# Patient Record
Sex: Female | Born: 1990 | Race: White | Hispanic: No | Marital: Married | Smoking: Never smoker
Health system: Southern US, Community
[De-identification: ages and names within clinical notes are randomized; demographics above are authoritative.]

## PROBLEM LIST (undated history)

## (undated) DIAGNOSIS — M7989 Other specified soft tissue disorders: Secondary | ICD-10-CM

## (undated) DIAGNOSIS — F909 Attention-deficit hyperactivity disorder, unspecified type: Secondary | ICD-10-CM

## (undated) DIAGNOSIS — F32A Depression, unspecified: Secondary | ICD-10-CM

## (undated) DIAGNOSIS — E739 Lactose intolerance, unspecified: Secondary | ICD-10-CM

## (undated) DIAGNOSIS — G43009 Migraine without aura, not intractable, without status migrainosus: Secondary | ICD-10-CM

## (undated) DIAGNOSIS — T7840XA Allergy, unspecified, initial encounter: Secondary | ICD-10-CM

## (undated) DIAGNOSIS — G9332 Myalgic encephalomyelitis/chronic fatigue syndrome: Secondary | ICD-10-CM

## (undated) DIAGNOSIS — F329 Major depressive disorder, single episode, unspecified: Secondary | ICD-10-CM

## (undated) DIAGNOSIS — K589 Irritable bowel syndrome without diarrhea: Secondary | ICD-10-CM

## (undated) DIAGNOSIS — M2550 Pain in unspecified joint: Secondary | ICD-10-CM

## (undated) DIAGNOSIS — M1990 Unspecified osteoarthritis, unspecified site: Secondary | ICD-10-CM

## (undated) DIAGNOSIS — F419 Anxiety disorder, unspecified: Secondary | ICD-10-CM

## (undated) DIAGNOSIS — M549 Dorsalgia, unspecified: Secondary | ICD-10-CM

## (undated) DIAGNOSIS — R5382 Chronic fatigue, unspecified: Secondary | ICD-10-CM

## (undated) DIAGNOSIS — K5900 Constipation, unspecified: Secondary | ICD-10-CM

## (undated) HISTORY — PX: KNEE ARTHROSCOPY: SUR90

## (undated) HISTORY — DX: Lactose intolerance, unspecified: E73.9

## (undated) HISTORY — DX: Unspecified osteoarthritis, unspecified site: M19.90

## (undated) HISTORY — DX: Myalgic encephalomyelitis/chronic fatigue syndrome: G93.32

## (undated) HISTORY — DX: Pain in unspecified joint: M25.50

## (undated) HISTORY — DX: Depression, unspecified: F32.A

## (undated) HISTORY — DX: Allergy, unspecified, initial encounter: T78.40XA

## (undated) HISTORY — DX: Attention-deficit hyperactivity disorder, unspecified type: F90.9

## (undated) HISTORY — DX: Migraine without aura, not intractable, without status migrainosus: G43.009

## (undated) HISTORY — DX: Irritable bowel syndrome, unspecified: K58.9

## (undated) HISTORY — DX: Other specified soft tissue disorders: M79.89

## (undated) HISTORY — PX: WISDOM TOOTH EXTRACTION: SHX21

## (undated) HISTORY — DX: Anxiety disorder, unspecified: F41.9

## (undated) HISTORY — DX: Dorsalgia, unspecified: M54.9

## (undated) HISTORY — DX: Chronic fatigue, unspecified: R53.82

## (undated) HISTORY — DX: Constipation, unspecified: K59.00

---
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
## 2014-09-04 ENCOUNTER — Ambulatory Visit (INDEPENDENT_AMBULATORY_CARE_PROVIDER_SITE_OTHER): Payer: Federal, State, Local not specified - PPO | Admitting: Physician Assistant

## 2014-09-04 VITALS — BP 114/76 | HR 74 | Temp 98.0°F | Resp 18 | Ht 66.0 in | Wt 261.0 lb

## 2014-09-04 DIAGNOSIS — Z0000 Encounter for general adult medical examination without abnormal findings: Secondary | ICD-10-CM | POA: Diagnosis not present

## 2014-09-04 DIAGNOSIS — Z111 Encounter for screening for respiratory tuberculosis: Secondary | ICD-10-CM

## 2014-09-04 NOTE — Progress Notes (Signed)
   Subjective:    Patient ID: Carmen Wall [place], female    DOB: 1991/06/04, 24 y.o.   MRN: [id number]  Chief Complaint  Patient presents with  . CPE    Work   There are no active problems to display for this patient.  Prior to Admission medications   Not on File   Medications, allergies, past medical history, surgical history, family history, social history and problem list reviewed and updated.  HPI  [age] yof with no significant pmh presents for cpe for work.   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 Denies any issues, aches, pains, complaints other than the below in the ROS.   She does not exercise. Married with one daughter, 3 yrs old. Started dieting 3 wks ago, no specific plan, just trying to limit calories. Sees a dentist yearly. No eye dr.   Last pap was 3 yrs ago. Pt declines pap today, stating she will obtain insurance with her new job next week and will plan to get a CPE at that time.  Last tdap with pregnancy. Declines any lab testing today until insurance takes effect.   Needs ppd placed for work.   Review of Systems Per ROS form:  Eye itching - both eyes watery/itchy during spring, recurrent for yrs Abd pain - Present since IUD inserted 3 yrs ago, stable, described as crampiness once month Increased thirst - Has been dieting past few wks, thinks she is more thirsty because of this Vaginal dc - Intermittent since IUD insertion, no odor, no itching Dizzy/lh - Mild, inermittent for 1-2 yrs, gets LH for several seconds if stands up too quickly Otherwise normal per ROS form.      Objective:   Physical Exam  Constitutional: She is oriented to person, place, and time. She appears well-developed and well-nourished.  Non-toxic appearance. She does not have a sickly appearance. She does not appear ill. No distress.  BP 114/76 mmHg  Pulse 74  Temp(Src) 98 F (36.7 C) (Oral)  Resp 18  Ht 5\' 6"  (1.676 Wall)  Wt 261 lb (118.389 kg)  BMI 42.15 kg/m2   SpO2 100%  LMP 08/21/2014   HENT:  Right Ear: Tympanic membrane normal.  Left Ear: Tympanic membrane normal.  Mouth/Throat: Uvula is midline, oropharynx is clear and moist and mucous membranes are normal.  Eyes: Conjunctivae and EOM are normal. Pupils are equal, round, and reactive to light.  Cardiovascular: Normal rate, regular rhythm and normal heart sounds.   Pulmonary/Chest: Effort normal and breath sounds normal. No tachypnea.  Abdominal: Soft. Normal appearance and bowel sounds are normal. There is no tenderness.  Neurological: She is alert and oriented to person, place, and time. She has normal strength. No cranial nerve deficit or sensory deficit. She displays a negative Romberg sign.  Psychiatric: She has a normal mood and affect. Her speech is normal and behavior is normal.      Assessment & Plan:   [age] yof with no significant pmh presents for cpe for work.   Annual physical exam --normal exam, vitals --ok to continue with position, no concerns --recd claritin for likely allergic rhinitis with her itchy/watery eyes --rtc once insured for pap, lab work, vaccines as needed  Encounter for PPD test - Plan: TB Skin Test --placed, rtc 48-72 hrs for check  Donnajean Lopesodd Wall. Naida Escalante, PA-C Physician Assistant-Certified [facility]  09/05/2014 10:06 AM

## 2014-09-04 NOTE — Patient Instructions (Signed)
Your physical exam was normal today.  Please come back in 48-72 hours for your TB recheck.  Please be sure to follow with a family care provider once you get insurance to get a pap smear and any further labs.

## 2014-09-04 NOTE — Progress Notes (Signed)

## 2014-09-07 ENCOUNTER — Ambulatory Visit (INDEPENDENT_AMBULATORY_CARE_PROVIDER_SITE_OTHER): Payer: Federal, State, Local not specified - PPO | Admitting: Family Medicine

## 2014-09-07 DIAGNOSIS — Z7689 Persons encountering health services in other specified circumstances: Secondary | ICD-10-CM

## 2014-09-07 DIAGNOSIS — Z111 Encounter for screening for respiratory tuberculosis: Secondary | ICD-10-CM

## 2014-09-07 LAB — TB SKIN TEST
Induration: 0 mm
TB Skin Test: NEGATIVE

## 2014-09-07 NOTE — Progress Notes (Signed)
   Subjective:    Patient ID: Carmen Wall [place], female    DOB: 04/24/1991, 24 y.o.   MRN: [id number]  HPI Patient is a 24 yo female who presents for a PPD test reading. Test was negative. Induration was 0.5600mm. Patient received a copy of her results upon leaving.     Review of Systems     Objective:   Physical Exam        Assessment & Plan:

## 2014-09-09 ENCOUNTER — Ambulatory Visit (INDEPENDENT_AMBULATORY_CARE_PROVIDER_SITE_OTHER): Payer: Federal, State, Local not specified - PPO | Admitting: Physician Assistant

## 2014-09-09 VITALS — BP 120/68 | HR 91 | Temp 98.6°F | Resp 20 | Ht 65.5 in | Wt 261.0 lb

## 2014-09-09 DIAGNOSIS — J309 Allergic rhinitis, unspecified: Secondary | ICD-10-CM | POA: Insufficient documentation

## 2014-09-09 DIAGNOSIS — J9801 Acute bronchospasm: Secondary | ICD-10-CM

## 2014-09-09 DIAGNOSIS — R0602 Shortness of breath: Secondary | ICD-10-CM

## 2014-09-09 HISTORY — DX: Allergic rhinitis, unspecified: J30.9

## 2014-09-09 MED ORDER — BECLOMETHASONE DIPROPIONATE 40 MCG/ACT IN AERS: 1.0000 | INHALATION_SPRAY | Freq: Two times a day (BID) | RESPIRATORY_TRACT | Status: DC

## 2014-09-09 MED ORDER — ALBUTEROL SULFATE (2.5 MG/3ML) 0.083% IN NEBU
2.5000 mg | INHALATION_SOLUTION | Freq: Once | RESPIRATORY_TRACT | Status: AC
  Administered 2014-09-09: 2.5 mg via RESPIRATORY_TRACT

## 2014-09-09 MED ORDER — IPRATROPIUM BROMIDE 0.02 % IN SOLN
0.5000 mg | Freq: Once | RESPIRATORY_TRACT | Status: AC
  Administered 2014-09-09: 0.5 mg via RESPIRATORY_TRACT

## 2014-09-09 MED ORDER — HYDROCOD POLST-CHLORPHEN POLST 10-8 MG/5ML PO LQCR: 5.0000 mL | Freq: Two times a day (BID) | ORAL | Status: DC | PRN

## 2014-09-09 MED ORDER — ALBUTEROL SULFATE HFA 108 (90 BASE) MCG/ACT IN AERS: 2.0000 | INHALATION_SPRAY | RESPIRATORY_TRACT | Status: DC | PRN

## 2014-09-09 NOTE — Progress Notes (Signed)
Subjective:    Patient ID: Carmen Wall, female    DOB: Jan 05, 1991, 24 y.o.   MRN: [id number]  HPI  This is a 24 year old female who is presenting with 5 days of cough, SOB and nasal congestion. She feels like she can't get a deep enough breath. She has noticed some wheezing with coughing. Cough is minimally productive of clear mucous. She is sneezing a lot when outdoors. She has tried claritin and dayquil which both help with nasal congestion but do not help cough. She denies sore throat, otalgia, fever or chills. No history of asthma. Not a smoker. Pt has mild environmental allergies, recently moved to [place]. This is her first spring in [place]. Prior to now she was living in [place] and [place] - never been this far [employer]. She has never had to use an inhaler before.  Review of Systems  Constitutional: Negative for fever and chills.  HENT: Positive for congestion. Negative for ear pain, sinus pressure and sore throat.   Eyes: Negative for redness.  Respiratory: Positive for cough, shortness of breath and wheezing.   Gastrointestinal: Negative for nausea and vomiting.  Skin: Negative for rash.  Allergic/Immunologic: Positive for environmental allergies.  Hematological: Negative for adenopathy.  Psychiatric/Behavioral: Positive for sleep disturbance.    There are no active problems to display for this patient.  Prior to Admission medications   Not on File   No Known Allergies  Patient's social and family history were reviewed.     Objective:   Physical Exam  Constitutional: She is oriented to person, place, and time. She appears well-developed and well-nourished. No distress.  HENT:  Head: Normocephalic and atraumatic.  Right Ear: Hearing, external ear and ear canal normal. Tympanic membrane is retracted.  Left Ear: Hearing, external ear and ear canal normal. Tympanic membrane is retracted.  Nose: Nose normal. Right sinus exhibits no maxillary sinus tenderness and no  frontal sinus tenderness. Left sinus exhibits no maxillary sinus tenderness and no frontal sinus tenderness.  Mouth/Throat: Uvula is midline, oropharynx is clear and moist and mucous membranes are normal.  Eyes: Conjunctivae and lids are normal. Right eye exhibits no discharge. Left eye exhibits no discharge. No scleral icterus.  Cardiovascular: Normal rate, regular rhythm, normal heart sounds and normal pulses.   No murmur heard. Pulmonary/Chest: Effort normal. No respiratory distress. She has decreased breath sounds. She has no wheezes. She has no rhonchi. She has no rales.  Breath sounds and symptoms improved after duoneb treatment  Musculoskeletal: Normal range of motion.  Lymphadenopathy:       Head (right side): No submental, no submandibular and no tonsillar adenopathy present.       Head (left side): No submental, no submandibular and no tonsillar adenopathy present.    She has no cervical adenopathy.  Neurological: She is alert and oriented to person, place, and time.  Skin: Skin is warm, dry and intact. No lesion and no rash noted.  Psychiatric: She has a normal mood and affect. Her speech is normal and behavior is normal. Thought content normal.   BP 120/68 mmHg  Pulse 91  Temp(Src) 98.6 F (37 C) (Oral)  Resp 20  Ht 5' 5.5" (1.664 m)  Wt 261 lb (118.389 kg)  BMI 42.76 kg/m2  SpO2 96%  LMP 08/21/2014     Assessment & Plan:  .1. SOB (shortness of breath) 2. Bronchospasm 3. Allergic rhinitis Suspect allergic rhinitis with reactive airway component. Symptoms and breath sounds improved with duoneb  treatment in office. She will continue claritin daily. She will use qvar twice a day for 2 weeks. Albuterol prn SOB, wheezing, chest tightness. She will return if not getting better in 1 week.  - albuterol (PROVENTIL) (2.5 MG/3ML) 0.083% nebulizer solution 2.5 mg; Take 3 mLs (2.5 mg total) by nebulization once. - ipratropium (ATROVENT) nebulizer solution 0.5 mg; Take 2.5 mLs (0.5  mg total) by nebulization once. - albuterol (PROVENTIL HFA;VENTOLIN HFA) 108 (90 BASE) MCG/ACT inhaler; Inhale 2 puffs into the lungs every 4 (four) hours as needed for wheezing or shortness of breath (cough, shortness of breath or wheezing.).  Dispense: 1 Inhaler; Refill: 1 - beclomethasone (QVAR) 40 MCG/ACT inhaler; Inhale 1 puff into the lungs 2 (two) times daily.  Dispense: 1 Inhaler; Refill: 0 - chlorpheniramine-HYDROcodone (TUSSIONEX PENNKINETIC ER) 10-8 MG/5ML LQCR; Take 5 mLs by mouth every 12 (twelve) hours as needed for cough (cough).  Dispense: 80 mL; Refill: 0     Carmen Wall V. Dyke BrackettBush, PA-C, MHS [facility]  09/09/2014

## 2014-09-09 NOTE — Patient Instructions (Signed)
Use qvar inhaler - 1 puff morning and night. Ask pharmacist to show you how to use it. Rinse your mouth out after using. Use albuterol inhaler as needed for chest tightness, shortness of breath or coughing spells. Continue the claritin daily. Return if you're not getting better in a week or so.

## 2014-09-26 ENCOUNTER — Ambulatory Visit (INDEPENDENT_AMBULATORY_CARE_PROVIDER_SITE_OTHER): Payer: Federal, State, Local not specified - PPO | Admitting: Family Medicine

## 2014-09-26 VITALS — BP 100/82 | HR 78 | Temp 98.0°F | Resp 20 | Ht 67.0 in | Wt 262.4 lb

## 2014-09-26 DIAGNOSIS — F32A Depression, unspecified: Secondary | ICD-10-CM

## 2014-09-26 DIAGNOSIS — A499 Bacterial infection, unspecified: Secondary | ICD-10-CM | POA: Diagnosis not present

## 2014-09-26 DIAGNOSIS — N898 Other specified noninflammatory disorders of vagina: Secondary | ICD-10-CM | POA: Diagnosis not present

## 2014-09-26 DIAGNOSIS — R1030 Lower abdominal pain, unspecified: Secondary | ICD-10-CM

## 2014-09-26 DIAGNOSIS — Z6841 Body Mass Index (BMI) 40.0 and over, adult: Secondary | ICD-10-CM

## 2014-09-26 DIAGNOSIS — Z124 Encounter for screening for malignant neoplasm of cervix: Secondary | ICD-10-CM

## 2014-09-26 DIAGNOSIS — F329 Major depressive disorder, single episode, unspecified: Secondary | ICD-10-CM | POA: Diagnosis not present

## 2014-09-26 DIAGNOSIS — N760 Acute vaginitis: Secondary | ICD-10-CM | POA: Diagnosis not present

## 2014-09-26 DIAGNOSIS — R635 Abnormal weight gain: Secondary | ICD-10-CM

## 2014-09-26 DIAGNOSIS — E669 Obesity, unspecified: Secondary | ICD-10-CM | POA: Diagnosis not present

## 2014-09-26 DIAGNOSIS — B9689 Other specified bacterial agents as the cause of diseases classified elsewhere: Secondary | ICD-10-CM

## 2014-09-26 LAB — POCT CBC
GRANULOCYTE PERCENT: 63.5 % (ref 37–80)
HCT, POC: 39.6 % (ref 37.7–47.9)
Hemoglobin: 13.2 g/dL (ref 12.2–16.2)
Lymph, poc: 2.3 (ref 0.6–3.4)
MCH, POC: 29.3 pg (ref 27–31.2)
MCHC: 33.2 g/dL (ref 31.8–35.4)
MCV: 88.4 fL (ref 80–97)
MID (CBC): 0.6 (ref 0–0.9)
MPV: 8.9 fL (ref 0–99.8)
POC GRANULOCYTE: 5.1 (ref 2–6.9)
POC LYMPH %: 29.3 % (ref 10–50)
POC MID %: 7.2 % (ref 0–12)
Platelet Count, POC: 230 10*3/uL (ref 142–424)
RBC: 4.48 M/uL (ref 4.04–5.48)
RDW, POC: 13.1 %
WBC: 8 10*3/uL (ref 4.6–10.2)

## 2014-09-26 LAB — POCT WET PREP WITH KOH
KOH Prep POC: NEGATIVE
RBC WET PREP PER HPF POC: NEGATIVE
Trichomonas, UA: NEGATIVE
Yeast Wet Prep HPF POC: NEGATIVE

## 2014-09-26 LAB — POCT URINE PREGNANCY: Preg Test, Ur: NEGATIVE

## 2014-09-26 MED ORDER — METRONIDAZOLE 500 MG PO TABS: 500.0000 mg | ORAL_TABLET | Freq: Two times a day (BID) | ORAL | Status: DC

## 2014-09-26 MED ORDER — FLUOXETINE HCL 20 MG PO TABS: 20.0000 mg | ORAL_TABLET | Freq: Every day | ORAL | Status: DC

## 2014-09-26 NOTE — Patient Instructions (Addendum)
We are going to treat you for bacterial vaginosis with flagyl twice a day for one week Also, let's start prozac for depression.  It will take a few weeks for you to notice the effect of this medication Start with 1 pill daily and increase to 2 pills after 2 weeks or so.   I will be in touch with your labs Let me know if you are getting worse Otherwise let's plan to recheck in 6-8 weeks.

## 2014-09-26 NOTE — Progress Notes (Signed)
[facility] [address], [place] [id number] [phone]  Date:  09/26/2014   Name:  Carmen Wall [place]   DOB:  06/20/1990   MRN:  [id number]  PCP:  No PCP Per Patient    Chief Complaint: Abdominal Cramping; Abdominal Pain; Gynecologic Exam; and Depression   History of Present Illness:  Carmen Wall [place] is a 24 y.o. very pleasant female patient who presents with the following:  Seen here earlier this month with respiratory illness; this is now better She is here today seeking care for several concerns.   She notes pelvic/ abd cramping that is worse than usual over the last few days.  Also, around her menses she seems to have "no control of my emotions."  She has a mirena IUD that was placed a little over 2 years ago.  She still tends to have regular menses with the IUD about once a month but the bleeding is not heavy.    No vomiting or diarrhea, she does occasionally have a loose stool. She also notes vaginal discharge, but this does not seem far from her baseline discharge.   No urinary sx She is eating normally, "more than I should."    She notes that she seems to respond poorly to stress more over the last couple of years. She is more irritable and tearful than is her norm.  Little things may cause her to cry. This is affecting her home life.   She does admit to feeling more sad than usual as well.    She was on depo prior to her pregnancy.  She got her IUD right after her daughter was born 3 years ago.  Besides becoming a mother her family also recently moved from another state and her husband left the [employer].  There have been a lot of changes in her life.  She denies any SI.  However she is interested in treatment to help with depression  Patient Active Problem List   Diagnosis Date Noted  . Rhinitis, allergic 09/09/2014    Past Medical History  Diagnosis Date  . Allergy   . Anxiety   . Arthritis     Past Surgical History  Procedure  Laterality Date  . Knee arthroscopy Right     History  Substance Use Topics  . Smoking status: Never Smoker   . Smokeless tobacco: Never Used  . Alcohol Use: No    Family History  Problem Relation Age of Onset  . Diabetes Father   . Mental illness Father     No Known Allergies  Medication list has been reviewed and updated.  Current Outpatient Prescriptions on File Prior to Visit  Medication Sig Dispense Refill  . albuterol (PROVENTIL HFA;VENTOLIN HFA) 108 (90 BASE) MCG/ACT inhaler Inhale 2 puffs into the lungs every 4 (four) hours as needed for wheezing or shortness of breath (cough, shortness of breath or wheezing.). 1 Inhaler 1  . beclomethasone (QVAR) 40 MCG/ACT inhaler Inhale 1 puff into the lungs 2 (two) times daily. 1 Inhaler 0  . chlorpheniramine-HYDROcodone (TUSSIONEX PENNKINETIC ER) 10-8 MG/5ML LQCR Take 5 mLs by mouth every 12 (twelve) hours as needed for cough (cough). 80 mL 0   No current facility-administered medications on file prior to visit.    Review of Systems:  As per HPI- otherwise negative.   Physical Examination: Filed Vitals:   09/26/14 1542  BP: 100/82  Pulse: 78  Temp: 98 F (36.7 C)  Resp: 20  Filed Vitals:   09/26/14 1542  Height:  (1.702 Wall)  Weight: 262 lb 6.4 oz (119.024 kg)   Body mass index is 41.09 kg/(Wall^2). Ideal Body Weight: Weight in (lb) to have BMI = 25: 159.3  GEN: WDWN, NAD, Non-toxic, A & O x 3, obese, looks well HEENT: Atraumatic, Normocephalic. Neck supple. No masses, No LAD.  Bilateral TM wnl, oropharynx normal.  PEERL,EOMI.   Ears and Nose: No external deformity. CV: RRR, No Wall/G/R. No JVD. No thrill. No extra heart sounds. PULM: CTA B, no wheezes, crackles, rhonchi. No retractions. No resp. distress. No accessory muscle use. ABD: S, ND, +BS. No rebound. No HSM.  She has mild diffuse tenderness over her belly EXTR: No c/c/e NEURO Normal gait.  PSYCH: Normally interactive. Conversant. Not depressed or anxious  appearing.  Calm demeanor.  Pelvic: normal, no vaginal lesions or discharge. Uterus normal, no CMT, no adnexal tendereness or masses IUD strings are visible.  Slight fishy odor  Results for orders placed or performed in visit on 09/26/14  POCT CBC  Result Value Ref Range   WBC 8.0 4.6 - 10.2 K/uL   Lymph, poc 2.3 0.6 - 3.4   POC LYMPH PERCENT 29.3 10 - 50 %L   MID (cbc) 0.6 0 - 0.9   POC MID % 7.2 0 - 12 %Wall   POC Granulocyte 5.1 2 - 6.9   Granulocyte percent 63.5 37 - 80 %G   RBC 4.48 4.04 - 5.48 Wall/uL   Hemoglobin 13.2 12.2 - 16.2 g/dL   HCT, POC 16.1 09.6 - 47.9 %   MCV 88.4 80 - 97 fL   MCH, POC 29.3 27 - 31.2 pg   MCHC 33.2 31.8 - 35.4 g/dL   RDW, POC 04.5 %   Platelet Count, POC 230 142 - 424 K/uL   MPV 8.9 0 - 99.8 fL  POCT urine pregnancy  Result Value Ref Range   Preg Test, Ur Negative   POCT Wet Prep with KOH  Result Value Ref Range   Trichomonas, UA Negative    Clue Cells Wet Prep HPF POC 5-10    Epithelial Wet Prep HPF POC 7-12    Yeast Wet Prep HPF POC neg    Bacteria Wet Prep HPF POC 4+    RBC Wet Prep HPF POC neg    WBC Wet Prep HPF POC 2-5    KOH Prep POC Negative     Assessment and Plan: Depression - Plan: FLUoxetine (PROZAC) 20 MG tablet  Lower abdominal pain - Plan: POCT CBC, POCT urine pregnancy, Comprehensive metabolic panel  Vaginal discharge - Plan: POCT Wet Prep with KOH  Screening for cervical cancer - Plan: Pap IG, CT/NG w/ reflex HPV when ASC-U  Bacterial vaginosis - Plan: metroNIDAZOLE (FLAGYL) 500 MG tablet  Weight gain - Plan: TSH  [name] is here with a few concerns today. Treat for BV- this may be the cause of her pelvic pain.  OW encouraged her to use scheduled ibuprofen around the time of her menses to help with pain and cramping She would like to start medication for depression; will start her on prozac at .   Await her other labs See patient instructions for more details.     Signed Abbe Amsterdam, MD

## 2014-09-27 ENCOUNTER — Encounter: Payer: Self-pay | Admitting: Family Medicine

## 2014-09-27 LAB — COMPREHENSIVE METABOLIC PANEL
ALBUMIN: 4.2 g/dL (ref 3.5–5.2)
ALT: 31 U/L (ref 0–35)
AST: 66 U/L — AB (ref 0–37)
Alkaline Phosphatase: 57 U/L (ref 39–117)
BILIRUBIN TOTAL: 0.4 mg/dL (ref 0.2–1.2)
BUN: 13 mg/dL (ref 6–23)
CO2: 26 mEq/L (ref 19–32)
Calcium: 9.5 mg/dL (ref 8.4–10.5)
Chloride: 105 mEq/L (ref 96–112)
Creat: 0.61 mg/dL (ref 0.50–1.10)
Glucose, Bld: 81 mg/dL (ref 70–99)
POTASSIUM: 4.5 meq/L (ref 3.5–5.3)
Sodium: 140 mEq/L (ref 135–145)
TOTAL PROTEIN: 6.9 g/dL (ref 6.0–8.3)

## 2014-09-27 LAB — TSH: TSH: 0.857 u[IU]/mL (ref 0.350–4.500)

## 2014-09-29 ENCOUNTER — Encounter: Payer: Self-pay | Admitting: Family Medicine

## 2014-09-30 LAB — PAP IG, CT-NG, RFX HPV ASCU
CHLAMYDIA PROBE AMP: NEGATIVE
GC PROBE AMP: NEGATIVE

## 2014-11-10 ENCOUNTER — Other Ambulatory Visit: Payer: Self-pay | Admitting: Physician Assistant

## 2015-06-08 ENCOUNTER — Ambulatory Visit (INDEPENDENT_AMBULATORY_CARE_PROVIDER_SITE_OTHER): Payer: No Typology Code available for payment source | Admitting: Emergency Medicine

## 2015-06-08 VITALS — BP 118/88 | HR 66 | Temp 98.1°F | Resp 16 | Ht 67.0 in | Wt 265.0 lb

## 2015-06-08 DIAGNOSIS — K0510 Chronic gingivitis, plaque induced: Secondary | ICD-10-CM | POA: Diagnosis not present

## 2015-06-08 DIAGNOSIS — J0140 Acute pansinusitis, unspecified: Secondary | ICD-10-CM | POA: Diagnosis not present

## 2015-06-08 MED ORDER — PSEUDOEPHEDRINE-GUAIFENESIN ER 60-600 MG PO TB12: 1.0000 | ORAL_TABLET | Freq: Two times a day (BID) | ORAL | Status: AC

## 2015-06-08 MED ORDER — AMOXICILLIN-POT CLAVULANATE 875-125 MG PO TABS: 1.0000 | ORAL_TABLET | Freq: Two times a day (BID) | ORAL | Status: DC

## 2015-06-08 NOTE — Patient Instructions (Signed)
Gingivitis °Gingivitis is a form of gum (periodontal) disease that causes redness, soreness, and swelling (inflammation) of your gums. °CAUSES °The most common cause of gingivitis is poor oral hygiene. A sticky substance made of bacteria, mucus, and food particles (plaque), is deposited on the exposed part of teeth. As plaque builds up, it reacts with the saliva in your mouth to form something called  tartar. Tartar is a hard deposit that becomes trapped around the base of the tooth. Plaque and tartar irritate the gums, leading to the formation of gingivitis. Other factors that increase your risk for gingivitis include:  °· Tobacco use. °· Diabetes. °· Older age. °· Certain medications. °· Certain viral or fungal infections. °· Dry mouth. °· Hormonal changes such as during pregnancy. °· Poor nutrition. °· Substance abuse. °· Poor fitting dental restorations or appliances. °SYMPTOMS °You may notice inflammation of the soft tissue (gingiva) around the teeth. When these tissues become inflamed, they bleed easily, especially during flossing or brushing. The gums may also be:  °· Tender to the touch. °· Bright red, purple red, or have a shiny appearance. °· Swollen. °· Wearing away from the teeth (receding), which exposes more of the tooth. °Bad breath is often present. Continued infection around teeth can eventually cause cavities and loosen teeth. This may lead to eventual tooth loss. °DIAGNOSIS °A medical and dental history will be taken. Your mouth, teeth, and gums will be examined. Your dentist will look for soft, swollen purple-red, irritated gums. There may be deposits of plaque and tartar at the base of the teeth. Your gums will be looked at for the degree of redness, puffiness, and bleeding tendencies. Your dentist will see if any of the teeth are loose. X-rays may be taken to see if the inflammation has spread to the supporting structures of the teeth. °TREATMENT °The goal is to reduce and reverse the  inflammation. Proper treatment can usually reverse the symptoms of gingivitis and prevent further progression of the disease. Have your teeth cleaned. During the cleaning, all plaque and tartar will be removed. Instruction for proper home care will be given. You will need regular professional cleanings and check-ups in the future. °HOME CARE INSTRUCTIONS °· Brush your teeth twice a day and floss at least once per day. When flossing, it is best to floss first then brush. °· Limit sugar between meals and maintain a well-balanced diet.  °· Even the best dental hygiene will not prevent plaque from developing. It is necessary for you to see your dentist on a regular basis for cleaning and regular checkups. °· Your dentist can recommend proper oral hygiene and mouth care and suggest special toothpastes or mouth rinses. °· Stop smoking. °SEEK DENTAL OR MEDICAL CARE IF: °· You have painful, reddened tissue around your teeth, or you have puffy swollen gums. °· You have difficulty chewing. °· You notice any loose or infected teeth. °· You have swollen glands. °· Your gums bleed easily when you brush your teeth or are very tender to the touch. °  °This information is not intended to replace advice given to you by your health care provider. Make sure you discuss any questions you have with your health care provider. °  °Document Released: 11/16/2000 Document Revised: 08/15/2011 Document Reviewed: 01/05/2015 °Elsevier Interactive Patient Education ©2016 [employer] ° °

## 2015-06-08 NOTE — Progress Notes (Signed)
Subjective:  Patient ID: Carmen Wall, female    DOB: [birth year]  Age: 25 y.o. MRN: [id number]  CC: Oral Swelling   HPI Carmen Wall presents   Patient been ill with nasal congestion postnasal drainage for approximately 2 weeks. She has no fever or chills. She has a history of prior sinus infections. Her nasal drainage is absent. She has purulent postnasal drainage however. She has  No cough wheezing or shortness of breath. Since becoming ill with a sinus infection she was eating popcorn got a piece of popcorn stuck between her teeth and was rather vigorous and get an removing it using various mechanical means including vigorous toothbrushing and floss. Since that time she's had swelling and redness of her gingiva on the mandibular jaw anteriorly in the area the measures.  He has no tooth pain but has increasing lymphadenitis  History Carmen Wall has a past medical history of Allergy; Anxiety; and Arthritis.   She has past surgical history that includes Knee arthroscopy (Right).   Her  family history includes Diabetes in her father; Mental illness in her father.  She   reports that she has never smoked. She has never used smokeless tobacco. She reports that she does not drink alcohol or use illicit drugs.  Outpatient Prescriptions Prior to Visit  Medication Sig Dispense Refill  . albuterol (PROVENTIL HFA;VENTOLIN HFA) 108 (90 BASE) MCG/ACT inhaler Inhale 2 puffs into the lungs every 4 (four) hours as needed for wheezing or shortness of breath (cough, shortness of breath or wheezing.). 1 Inhaler 1  . beclomethasone (QVAR) 40 MCG/ACT inhaler Inhale 1 puff into the lungs 2 (two) times daily. (Patient not taking: Reported on 06/08/2015) 1 Inhaler 0  . chlorpheniramine-HYDROcodone (TUSSIONEX PENNKINETIC ER) 10-8 MG/5ML LQCR Take 5 mLs by mouth every 12 (twelve) hours as needed for cough (cough). (Patient not taking: Reported on 06/08/2015) 80 mL 0  . FLUoxetine (PROZAC) 20 MG  tablet Take 1 tablet (20 mg total) by mouth daily. Increase to 40 mg after 2 weeks (Patient not taking: Reported on 06/08/2015) 60 tablet 6  . metroNIDAZOLE (FLAGYL) 500 MG tablet Take 1 tablet (500 mg total) by mouth 2 (two) times daily. Take 1 pill twice daily for one week. NO alcohol (Patient not taking: Reported on 06/08/2015) 14 tablet 0   No facility-administered medications prior to visit.    Social History   Social History  . Marital Status: Married    Spouse Name: N/A  . Number of Children: N/A  . Years of Education: N/A   Social History Main Topics  . Smoking status: Never Smoker   . Smokeless tobacco: Never Used  . Alcohol Use: No  . Drug Use: No  . Sexual Activity: Not Asked   Other Topics Concern  . None   Social History Narrative     Review of Systems  Constitutional: Positive for chills. Negative for fever and appetite change.  HENT: Positive for dental problem and postnasal drip. Negative for congestion, ear pain, sinus pressure and sore throat.   Eyes: Negative for pain and redness.  Respiratory: Negative for cough, shortness of breath and wheezing.   Cardiovascular: Negative for leg swelling.  Gastrointestinal: Negative for nausea, vomiting, abdominal pain, diarrhea, constipation and blood in stool.  Endocrine: Negative for polyuria.  Genitourinary: Negative for dysuria, urgency, frequency and flank pain.  Musculoskeletal: Negative for gait problem.  Skin: Negative for rash.  Neurological: Negative for weakness and headaches.  Psychiatric/Behavioral: Negative for confusion and decreased  concentration. The patient is not nervous/anxious.     Objective:  BP 118/88 mmHg  Pulse 66  Temp(Src) 98.1 F (36.7 C) (Oral)  Resp 16  Ht 5\' 7"  (1.702 m)  Wt 265 lb (120.203 kg)  BMI 41.50 kg/m2  SpO2 98%  LMP 05/28/2015  Physical Exam  Constitutional: She is oriented to person, place, and time. She appears well-developed and well-nourished.  HENT:  Head:  Normocephalic and atraumatic.  Mandibular gingivitis   Eyes: Conjunctivae are normal. Pupils are equal, round, and reactive to light.  Pulmonary/Chest: Effort normal.  Musculoskeletal: She exhibits no edema.  Neurological: She is alert and oriented to person, place, and time.  Skin: Skin is dry.  Psychiatric: She has a normal mood and affect. Her behavior is normal. Thought content normal.      Assessment & Plan:   Carmen Wall was seen today for oral swelling.  Diagnoses and all orders for this visit:  Acute pansinusitis, recurrence not specified  Gingivitis  Other orders -     amoxicillin-clavulanate (AUGMENTIN) 875-125 MG tablet; Take 1 tablet by mouth 2 (two) times daily. -     pseudoephedrine-guaifenesin (MUCINEX D) 60-600 MG 12 hr tablet; Take 1 tablet by mouth every 12 (twelve) hours.  I am having Carmen Wall start on amoxicillin-clavulanate and pseudoephedrine-guaifenesin. I am also having her maintain her chlorpheniramine-HYDROcodone, albuterol, beclomethasone, FLUoxetine, and metroNIDAZOLE.  Meds ordered this encounter  Medications  . amoxicillin-clavulanate (AUGMENTIN) 875-125 MG tablet    Sig: Take 1 tablet by mouth 2 (two) times daily.    Dispense:  20 tablet    Refill:  0  . pseudoephedrine-guaifenesin (MUCINEX D) 60-600 MG 12 hr tablet    Sig: Take 1 tablet by mouth every 12 (twelve) hours.    Dispense:  18 tablet    Refill:  0    Appropriate red flag conditions were discussed with the patient as well as actions that should be taken.  Patient expressed his understanding.  Follow-up: Return if symptoms worsen or fail to improve.  Carmelina DaneAnderson, Makani Seckman S, MD

## 2015-07-25 ENCOUNTER — Encounter (HOSPITAL_COMMUNITY): Payer: Self-pay | Admitting: Emergency Medicine

## 2015-07-25 ENCOUNTER — Emergency Department (HOSPITAL_COMMUNITY): Payer: No Typology Code available for payment source

## 2015-07-25 ENCOUNTER — Emergency Department (HOSPITAL_COMMUNITY)
Admission: EM | Admit: 2015-07-25 | Discharge: 2015-07-25 | Disposition: A | Discharge status: Home / Self Care | Payer: No Typology Code available for payment source | Attending: Emergency Medicine | Admitting: Emergency Medicine

## 2015-07-25 DIAGNOSIS — Z8739 Personal history of other diseases of the musculoskeletal system and connective tissue: Secondary | ICD-10-CM | POA: Diagnosis not present

## 2015-07-25 DIAGNOSIS — R319 Hematuria, unspecified: Secondary | ICD-10-CM

## 2015-07-25 DIAGNOSIS — Z3202 Encounter for pregnancy test, result negative: Secondary | ICD-10-CM | POA: Insufficient documentation

## 2015-07-25 DIAGNOSIS — Z8659 Personal history of other mental and behavioral disorders: Secondary | ICD-10-CM | POA: Diagnosis not present

## 2015-07-25 DIAGNOSIS — Z79899 Other long term (current) drug therapy: Secondary | ICD-10-CM | POA: Diagnosis not present

## 2015-07-25 DIAGNOSIS — R109 Unspecified abdominal pain: Secondary | ICD-10-CM | POA: Diagnosis present

## 2015-07-25 DIAGNOSIS — N390 Urinary tract infection, site not specified: Secondary | ICD-10-CM | POA: Diagnosis not present

## 2015-07-25 LAB — URINALYSIS, ROUTINE W REFLEX MICROSCOPIC
Bilirubin Urine: NEGATIVE
Glucose, UA: NEGATIVE mg/dL
KETONES UR: NEGATIVE mg/dL
Nitrite: NEGATIVE
PROTEIN: NEGATIVE mg/dL
Specific Gravity, Urine: 1.016 (ref 1.005–1.030)
pH: 7.5 (ref 5.0–8.0)

## 2015-07-25 LAB — CBC WITH DIFFERENTIAL/PLATELET
BASOS ABS: 0 10*3/uL (ref 0.0–0.1)
Basophils Relative: 0 %
EOS ABS: 0.1 10*3/uL (ref 0.0–0.7)
Eosinophils Relative: 1 %
HEMATOCRIT: 42 % (ref 36.0–46.0)
HEMOGLOBIN: 13.8 g/dL (ref 12.0–15.0)
Lymphocytes Relative: 22 %
Lymphs Abs: 1.7 10*3/uL (ref 0.7–4.0)
MCH: 30.1 pg (ref 26.0–34.0)
MCHC: 32.9 g/dL (ref 30.0–36.0)
MCV: 91.5 fL (ref 78.0–100.0)
MONO ABS: 0.6 10*3/uL (ref 0.1–1.0)
Monocytes Relative: 8 %
NEUTROS ABS: 5.2 10*3/uL (ref 1.7–7.7)
NEUTROS PCT: 69 %
Platelets: 211 10*3/uL (ref 150–400)
RBC: 4.59 MIL/uL (ref 3.87–5.11)
RDW: 12.8 % (ref 11.5–15.5)
WBC: 7.6 10*3/uL (ref 4.0–10.5)

## 2015-07-25 LAB — I-STAT BETA HCG BLOOD, ED (MC, WL, AP ONLY)

## 2015-07-25 LAB — URINE MICROSCOPIC-ADD ON

## 2015-07-25 LAB — COMPREHENSIVE METABOLIC PANEL
ALT: 13 U/L — ABNORMAL LOW (ref 14–54)
ANION GAP: 8 (ref 5–15)
AST: 15 U/L (ref 15–41)
Albumin: 4.1 g/dL (ref 3.5–5.0)
Alkaline Phosphatase: 56 U/L (ref 38–126)
BUN: 13 mg/dL (ref 6–20)
CO2: 26 mmol/L (ref 22–32)
Calcium: 9.3 mg/dL (ref 8.9–10.3)
Chloride: 108 mmol/L (ref 101–111)
Creatinine, Ser: 0.79 mg/dL (ref 0.44–1.00)
GFR calc Af Amer: >60 mL/min (ref >60)
Glucose, Bld: 87 mg/dL (ref 65–99)
POTASSIUM: 3.9 mmol/L (ref 3.5–5.1)
Sodium: 142 mmol/L (ref 135–145)
TOTAL PROTEIN: 7.3 g/dL (ref 6.5–8.1)
Total Bilirubin: 0.5 mg/dL (ref 0.3–1.2)

## 2015-07-25 LAB — LIPASE, BLOOD: LIPASE: 21 U/L (ref 11–51)

## 2015-07-25 MED ORDER — HYDROCODONE-ACETAMINOPHEN 5-325 MG PO TABS
1.0000 | ORAL_TABLET | Freq: Once | ORAL | Status: AC
  Administered 2015-07-25: 1 via ORAL
  Filled 2015-07-25: qty 1

## 2015-07-25 MED ORDER — CEPHALEXIN 500 MG PO CAPS: 500.0000 mg | ORAL_CAPSULE | Freq: Three times a day (TID) | ORAL | Status: DC

## 2015-07-25 MED ORDER — ONDANSETRON 4 MG PO TBDP
4.0000 mg | ORAL_TABLET | Freq: Once | ORAL | Status: AC
  Administered 2015-07-25: 4 mg via ORAL
  Filled 2015-07-25: qty 1

## 2015-07-25 NOTE — ED Notes (Signed)
Patient transported to CT 

## 2015-07-25 NOTE — ED Notes (Signed)
Right sided abdominal pain that started this morning and has been worsening. States her urine was cloudy this morning. States she feels weak and nauseous right now from the pain, did not take any meds prior to coming here. Denies vomiting, diarrhea, fever, chills, dysuria, hematuria. Flank pain unchanged with palpation. Abdomin soft, pain unchanged with palpation.

## 2015-07-25 NOTE — ED Notes (Signed)
Pt cannot use restroom at this time, aware urine specimen is needed.  

## 2015-07-25 NOTE — ED Notes (Addendum)
iStat beta hcg <5.0 IU/L RN Lequita Halt aware

## 2015-07-25 NOTE — Discharge Instructions (Signed)
Your CT scan today was normal. Your urine did show signs of possible infection. Given your symptoms I will treat you for a UTI and give you prescription for one week of antibiotics. You may take tylenol or ibuprofen as needed for pain.   Take medications as prescribed. Return to the emergency room for worsening condition or new concerning symptoms. Follow up with your regular doctor. If you don't have a regular doctor use one of the numbers below to establish a primary care doctor.   Emergency Department Resource Guide 1) Find a Doctor and Pay Out of Pocket Although you won't have to find out who is covered by your insurance plan, it is a good idea to ask around and get recommendations. You will then need to call the office and see if the doctor you have chosen will accept you as a new patient and what types of options they offer for patients who are self-pay. Some doctors offer discounts or will set up payment plans for their patients who do not have insurance, but you will need to ask so you aren't surprised when you get to your appointment.  2) Contact Your Local Health Department Not all health departments have doctors that can see patients for sick visits, but many do, so it is worth a call to see if yours does. If you don't know where your local health department is, you can check in your phone book. The CDC also has a tool to help you locate your state's health department, and many state websites also have listings of all of their local health departments.  3) Find a Walk-in Clinic If your illness is not likely to be very severe or complicated, you may want to try a walk in clinic. These are popping up all over the country in pharmacies, drugstores, and shopping centers. They're usually staffed by nurse practitioners or physician assistants that have been trained to treat common illnesses and complaints. They're usually fairly quick and inexpensive. However, if you have serious medical issues or  chronic medical problems, these are probably not your best option.  No Primary Care Doctor: - Call Health Connect at  [phone] - they can help you locate a primary care doctor that  accepts your insurance, provides certain services, etc. - Physician Referral [phone]  Emergency Department Resource Guide 1) Find a Doctor and Pay Out of Pocket Although you won't have to find out who is covered by your insurance plan, it is a good idea to ask around and get recommendations. You will then need to call the office and see if the doctor you have chosen will accept you as a new patient and what types of options they offer for patients who are self-pay. Some doctors offer discounts or will set up payment plans for their patients who do not have insurance, but you will need to ask so you aren't surprised when you get to your appointment.  2) Contact Your Local Health Department Not all health departments have doctors that can see patients for sick visits, but many do, so it is worth a call to see if yours does. If you don't know where your local health department is, you can check in your phone book. The CDC also has a tool to help you locate your state's health department, and many state websites also have listings of all of their local health departments.  3) Find a Walk-in Clinic If your illness is not likely to be very severe or complicated,  you may want to try a walk in clinic. These are popping up all over the country in pharmacies, drugstores, and shopping centers. They're usually staffed by nurse practitioners or physician assistants that have been trained to treat common illnesses and complaints. They're usually fairly quick and inexpensive. However, if you have serious medical issues or chronic medical problems, these are probably not your best option.  No Primary Care Doctor: - Call Health Connect at  [phone] - they can help you locate a primary care doctor that  accepts your  insurance, provides certain services, etc. - Physician Referral Service- [phone]  Chronic Pain Problems: Organization         Address  Phone   Notes  [facility]  [phone] Patients need to be referred by their primary care doctor.   Medication Assistance: Organization         Address  Phone   Notes  [facility] [address] [place] [place], [place] [address] [phone] --Must be a resident of [facility] -- Must have NO insurance coverage whatsoever (no Medicaid/ Medicare, etc.) -- The pt. MUST have a primary care doctor that directs their care regularly and follows them in the community   [facility]  [phone]   [employer]  [phone]    Agencies that provide inexpensive medical care: Organization         Address  Phone   Notes  [facility]  [phone]   [facility]    [phone]   [facility] [address] [place], [place] [address] [phone]   [facility] [address] [place], [place] [phone]   [facility]    [phone]   [facility]    [phone]   [facility]  [address], [place] Phone:  [phone], Fax:  [phone] Hours of Operation:  9 am - 6 pm, M-F.  Also accepts Medicaid/Medicare and self-pay.  [facility]  [address], [place] Phone: [phone], Fax: [phone]. Hours of Operation:  8:30 am - 5:30 pm, M-F.  Also accepts Medicaid and self-pay.  [facility] [address] [place] [address] [place] Phone: [phone]   [facility] [address] Natasha Bence [place], [place] [phone], Ext. [phone] Mondays & Thursdays: 7-9 AM.  First 15 patients are seen on a first come, first serve basis.    Medicaid-accepting [facility] Providers:  Organization          Address  Phone   Notes  [facility] [address], [place] [phone] Also accepts self-pay patients.  [facility] [address] Laurell Josephs [place], [place]  [phone]   [facility] [name], [place] [phone]   [facility] [address] [place] [phone]   Renaye Rakers [address], [place]   [phone] Only accepts [place] patients after they have their name applied to their card.   Self-Pay (no insurance) in [facility]:  Organization         Address  Phone   Notes  Sickle Cell Patients, [facility] [address] [place], [place] [phone]   [facility] [address] [place],  [place] [phone]   [facility] [address]  [place], [address], [place] [phone]   [facility]/Dr. Osei-Bonsu  [address], [place] [address], [place] [phone] Phone number for both [place] and [place] locations is the same.  Urgent Medical and [facility] [address] [place] [phone]   [facility] [address], [place] or [address] [phone]   [facility] [address] [place] [phone], phone; [phone], fax Sees patients 1st and 3rd Saturday of every month.  Must not qualify for public or private insurance (i.e. Medicaid, Medicare, [place] [employer] Choice, Veterans' Benefits)  Household income should be no more than 200% of the poverty level The clinic cannot treat you if you are pregnant or think you are pregnant  Sexually transmitted diseases are not treated at the clinic.

## 2015-07-25 NOTE — ED Provider Notes (Signed)
CSN: [id number]     Arrival date & time 07/25/15  4098 History   First MD Initiated Contact with Patient 07/25/15 1011     Chief Complaint  Patient presents with  . Abdominal Pain  . Flank Pain    HPI   Ms. Carmen Wall is an 25 y.o. female with history of anxiety who presents to the ED for evaluation of right flank pain. She states she noticed the pain early this morning as it woke her from sleep. She states she tossed and turned all morning and could not get back to sleep due to the pain. She states the pain is in her right lower back/flank are and radiates to her right lateral abdomen. She states the pain feels like a constant squeezing pain that will flare up in severe bouts that she states feels like "tearing her insides apart." She states she has noticed increased urinary frequency and cloudiness the past several days but states she is just coming off her period and she usually gets these symptoms with her period. Denies dysuria. Denies gross hematuria. Denies fever, chills. She reports feeling nauseated on the way to the ED but denies any emesis today. Denies diarrhea. She denies history of kidney stone, states she has had one UTI ever in the past. Pt has IUD in place. Denies vaginal discharge or bleeding.  Past Medical History  Diagnosis Date  . Allergy   . Anxiety   . Arthritis    Past Surgical History  Procedure Laterality Date  . Knee arthroscopy Right    Family History  Problem Relation Age of Onset  . Diabetes Father   . Mental illness Father    Social History  Substance Use Topics  . Smoking status: Never Smoker   . Smokeless tobacco: Never Used  . Alcohol Use: No   OB History    No data available     Review of Systems  All other systems reviewed and are negative.     Allergies  Review of patient's allergies indicates no known allergies.  Home Medications   Prior to Admission medications   Medication Sig Start Date End Date Taking? Authorizing Provider   albuterol (PROVENTIL HFA;VENTOLIN HFA) 108 (90 BASE) MCG/ACT inhaler Inhale 2 puffs into the lungs every 4 (four) hours as needed for wheezing or shortness of breath (cough, shortness of breath or wheezing.). 09/09/14   Dorna Leitz, PA-C  amoxicillin-clavulanate (AUGMENTIN) 875-125 MG tablet Take 1 tablet by mouth 2 (two) times daily. 06/08/15   Carmelina Dane, MD  beclomethasone (QVAR) 40 MCG/ACT inhaler Inhale 1 puff into the lungs 2 (two) times daily. Patient not taking: Reported on 06/08/2015 09/09/14   Dorna Leitz, PA-C  chlorpheniramine-HYDROcodone [facility] PENNKINETIC ER) 10-8 MG/5ML LQCR Take 5 mLs by mouth every 12 (twelve) hours as needed for cough (cough). Patient not taking: Reported on 06/08/2015 09/09/14   Dorna Leitz, PA-C  FLUoxetine (PROZAC) 20 MG tablet Take 1 tablet (20 mg total) by mouth daily. Increase to 40 mg after 2 weeks Patient not taking: Reported on 06/08/2015 09/26/14   Gwenlyn Found Copland, MD  metroNIDAZOLE (FLAGYL) 500 MG tablet Take 1 tablet (500 mg total) by mouth 2 (two) times daily. Take 1 pill twice daily for one week. NO alcohol Patient not taking: Reported on 06/08/2015 09/26/14   Pearline Cables, MD  pseudoephedrine-guaifenesin (MUCINEX D) 60-600 MG 12 hr tablet Take 1 tablet by mouth every 12 (twelve) hours. 06/08/15 06/07/16  Carmelina Dane, MD  BP 127/78 mmHg  Pulse 76  Temp(Src) 98.2 F (36.8 C) (Oral)  Resp 18  SpO2 99% Physical Exam  Constitutional: She is oriented to person, place, and time.  Appears uncomfortable, NAD  HENT:  Right Ear: External ear normal.  Left Ear: External ear normal.  Nose: Nose normal.  Mouth/Throat: Oropharynx is clear and moist. No oropharyngeal exudate.  Eyes: Conjunctivae and EOM are normal. Pupils are equal, round, and reactive to light.  Neck: Normal range of motion. Neck supple.  Cardiovascular: Normal rate, regular rhythm, normal heart sounds and intact distal pulses.   Pulmonary/Chest: Effort normal and breath  sounds normal. No respiratory distress. She has no wheezes. She exhibits no tenderness.  Abdominal: Soft. Bowel sounds are normal. She exhibits no distension.  Marked R CVA tenderness Mild right sided abdominal tenderness Abdomen soft, no guarding. No rebound. Negative Rovsing's. Negative Murphy's.   Musculoskeletal: She exhibits no edema.  Neurological: She is alert and oriented to person, place, and time.  Skin: Skin is warm and dry.  Psychiatric: She has a normal mood and affect.  Nursing note and vitals reviewed.   ED Course  Procedures (including critical care time) Labs Review Labs Reviewed  COMPREHENSIVE METABOLIC PANEL - Abnormal; Notable for the following:    ALT 13 (*)    All other components within normal limits  URINALYSIS, ROUTINE W REFLEX MICROSCOPIC (NOT AT [facility]) - Abnormal; Notable for the following:    APPearance CLOUDY (*)    Hgb urine dipstick SMALL (*)    Leukocytes, UA SMALL (*)    All other components within normal limits  URINE MICROSCOPIC-ADD ON - Abnormal; Notable for the following:    Squamous Epithelial / LPF 6-30 (*)    Bacteria, UA MANY (*)    All other components within normal limits  URINE CULTURE  LIPASE, BLOOD  CBC WITH DIFFERENTIAL/PLATELET  I-STAT BETA HCG BLOOD, ED ([facility], [facility], [facility] ONLY)    Imaging Review Ct Renal Stone Study  07/25/2015  CLINICAL DATA:  Worsening right abdominal pain since this morning. Cloudy urine this morning. Nausea and weakness. EXAM: CT ABDOMEN AND PELVIS WITHOUT CONTRAST TECHNIQUE: Multidetector CT imaging of the abdomen and pelvis was performed following the standard protocol without IV contrast. COMPARISON:  None. FINDINGS: Lower chest:  No acute findings. Hepatobiliary: Normal appearing liver and gallbladder. Pancreas: No mass or inflammatory process identified on this un-enhanced exam. Spleen: Within normal limits in size. Adrenals/Urinary Tract: No evidence of urolithiasis or hydronephrosis. No definite mass visualized  on this un-enhanced exam. Stomach/Bowel: No gastrointestinal abnormalities. Normal appearing appendix containing some calcific density. Vascular/Lymphatic: No pathologically enlarged lymph nodes. No evidence of abdominal aortic aneurysm. Reproductive: Intrauterine device in the uterus. Normal appearing ovaries. Other: None. Musculoskeletal:  No suspicious bone lesions identified. IMPRESSION: Normal examination. Electronically Signed   By: Beckie Salts M.D.   On: 07/25/2015 11:59   I have personally reviewed and evaluated these images and lab results as part of my medical decision-making.   EKG Interpretation None      MDM   Final diagnoses:  Right flank pain  Urinary tract infection with hematuria, site unspecified    Given acute onset flank pain with CVA tenderness, possible urinary symptoms, suspect renal stone vs UTI. Lower suspicion for appendicitis, cholecystitis. Will check abdominal labs including CBC, CMP, lipase, UA, hcg. Will obtain CT renal study. Pt states she is very sensitive to pain meds, declines IV pain meds at this time. PO vicodin and zofran given.  CT negative. UA does show small hgb, small leuks, and many bacteria. I do suspect some contamination given amount of squamous epithelial cells. However, given right CVA tenderness, urinary frequency, and otherwise unrevealing workup, I will treat as UTI. Rx given for keflex and urine cultures sent. Resource guide given to establish PCP for f/u. ER return precautions given.   Carlene Coria, PA-C 07/27/15 1401  Mancel Bale, MD 07/28/15 [phone]

## 2015-07-28 LAB — URINE CULTURE: Culture: >=100000

## 2015-07-29 ENCOUNTER — Telehealth (HOSPITAL_BASED_OUTPATIENT_CLINIC_OR_DEPARTMENT_OTHER): Payer: Self-pay | Admitting: Emergency Medicine

## 2015-07-29 NOTE — Telephone Encounter (Signed)
Post ED Visit - Positive Culture Follow-up  Culture report reviewed by antimicrobial stewardship pharmacist:   Enzo Bi, Pharm.D.  Celedonio Miyamoto, Pharm.D., BCPS  Garvin Fila, Pharm.D.  Georgina Pillion, Pharm.D., BCPS  [place], [place].D., BCPS, AAHIVP  Estella Husk, Pharm.D., BCPS, AAHIVP  Tennis Must, Pharm.D.  Sherle Poe, [place].D.  Positive urine culture E. coli Treated with cephalexin, organism sensitive to the same and no further patient follow-up is required at this time.  Berle Mull 07/29/2015, 11:28 AM

## 2016-06-14 ENCOUNTER — Ambulatory Visit: Payer: BLUE CROSS/BLUE SHIELD | Admitting: Nurse Practitioner

## 2016-07-12 ENCOUNTER — Other Ambulatory Visit (INDEPENDENT_AMBULATORY_CARE_PROVIDER_SITE_OTHER): Payer: BLUE CROSS/BLUE SHIELD

## 2016-07-12 ENCOUNTER — Ambulatory Visit (INDEPENDENT_AMBULATORY_CARE_PROVIDER_SITE_OTHER): Payer: BLUE CROSS/BLUE SHIELD | Admitting: Nurse Practitioner

## 2016-07-12 ENCOUNTER — Encounter: Payer: Self-pay | Admitting: Nurse Practitioner

## 2016-07-12 VITALS — BP 114/80 | HR 75 | Temp 97.6°F | Ht 67.0 in | Wt 275.0 lb

## 2016-07-12 DIAGNOSIS — R350 Frequency of micturition: Secondary | ICD-10-CM | POA: Insufficient documentation

## 2016-07-12 DIAGNOSIS — R194 Change in bowel habit: Secondary | ICD-10-CM | POA: Insufficient documentation

## 2016-07-12 DIAGNOSIS — Z30433 Encounter for removal and reinsertion of intrauterine contraceptive device: Secondary | ICD-10-CM

## 2016-07-12 DIAGNOSIS — Z6841 Body Mass Index (BMI) 40.0 and over, adult: Secondary | ICD-10-CM | POA: Diagnosis not present

## 2016-07-12 LAB — CBC WITH DIFFERENTIAL/PLATELET
BASOS PCT: 1.1 % (ref 0.0–3.0)
Basophils Absolute: 0.1 10*3/uL (ref 0.0–0.1)
EOS ABS: 0.1 10*3/uL (ref 0.0–0.7)
Eosinophils Relative: 2.1 % (ref 0.0–5.0)
HCT: 41.8 % (ref 36.0–46.0)
HEMOGLOBIN: 14.2 g/dL (ref 12.0–15.0)
LYMPHS ABS: 1.7 10*3/uL (ref 0.7–4.0)
Lymphocytes Relative: 30.8 % (ref 12.0–46.0)
MCHC: 33.9 g/dL (ref 30.0–36.0)
MCV: 89.4 fl (ref 78.0–100.0)
MONO ABS: 0.4 10*3/uL (ref 0.1–1.0)
Monocytes Relative: 7.7 % (ref 3.0–12.0)
NEUTROS ABS: 3.3 10*3/uL (ref 1.4–7.7)
NEUTROS PCT: 58.3 % (ref 43.0–77.0)
PLATELETS: 225 10*3/uL (ref 150.0–400.0)
RBC: 4.68 Mil/uL (ref 3.87–5.11)
RDW: 13.2 % (ref 11.5–15.5)
WBC: 5.7 10*3/uL (ref 4.0–10.5)

## 2016-07-12 LAB — URINALYSIS, ROUTINE W REFLEX MICROSCOPIC
Bilirubin Urine: NEGATIVE
KETONES UR: NEGATIVE
Nitrite: NEGATIVE
SPECIFIC GRAVITY, URINE: 1.02 (ref 1.000–1.030)
TOTAL PROTEIN, URINE-UPE24: NEGATIVE
URINE GLUCOSE: NEGATIVE
UROBILINOGEN UA: 0.2 (ref 0.0–1.0)
pH: 8 (ref 5.0–8.0)

## 2016-07-12 LAB — LIPID PANEL
CHOL/HDL RATIO: 4
Cholesterol: 214 mg/dL — ABNORMAL HIGH (ref 0–200)
HDL: 50.2 mg/dL (ref >39.00)
LDL Cholesterol: 140 mg/dL — ABNORMAL HIGH (ref 0–99)
NONHDL: 164.2
TRIGLYCERIDES: 123 mg/dL (ref 0.0–149.0)
VLDL: 24.6 mg/dL (ref 0.0–40.0)

## 2016-07-12 LAB — BASIC METABOLIC PANEL
BUN: 13 mg/dL (ref 6–23)
CHLORIDE: 106 meq/L (ref 96–112)
CO2: 30 meq/L (ref 19–32)
Calcium: 9.5 mg/dL (ref 8.4–10.5)
Creatinine, Ser: 0.76 mg/dL (ref 0.40–1.20)
GFR: 96.69 mL/min (ref >60.00)
GLUCOSE: 86 mg/dL (ref 70–99)
POTASSIUM: 5.1 meq/L (ref 3.5–5.1)
SODIUM: 140 meq/L (ref 135–145)

## 2016-07-12 LAB — HEPATIC FUNCTION PANEL
ALBUMIN: 4.3 g/dL (ref 3.5–5.2)
ALT: 20 U/L (ref 0–35)
AST: 20 U/L (ref 0–37)
Alkaline Phosphatase: 64 U/L (ref 39–117)
BILIRUBIN TOTAL: 0.4 mg/dL (ref 0.2–1.2)
Bilirubin, Direct: 0.1 mg/dL (ref 0.0–0.3)
Total Protein: 7.1 g/dL (ref 6.0–8.3)

## 2016-07-12 LAB — TSH: TSH: 0.82 u[IU]/mL (ref 0.35–4.50)

## 2016-07-12 MED ORDER — SACCHAROMYCES BOULARDII 250 MG PO CAPS: 250.0000 mg | ORAL_CAPSULE | Freq: Two times a day (BID) | ORAL | 1 refills | Status: DC

## 2016-07-12 NOTE — Patient Instructions (Addendum)
Go to basement for blood draw and stool sample containers.   [facility], [facility].  If you're ready to start your Journey with [facility], please contact us at one of our offices below: [facility] David LaMond, D.O. [address] [place], [place] [address] p: [phone] [place] David LaMond, D.O. [address] [place] [address] [place], [place] [address] p:[phone] [facility] [address] [place], [place] [address] p:[phone] [facility] [address] [place], [place] [address] [phone]:[phone]

## 2016-07-12 NOTE — Progress Notes (Signed)
Subjective:    Patient ID: Carmen Wall [place], female    DOB: 1990-10-12, 26 y.o.   MRN: [id number]  Patient presents today for establish care (new patient)  Urinary Tract Infection   This is a new problem. The current episode started 1 to 4 weeks ago. The problem occurs every urination. The problem has been unchanged. The pain is at a severity of 0/10. The patient is experiencing no pain. There has been no fever. She is sexually active. There is no history of pyelonephritis. Associated symptoms include frequency and urgency. Pertinent negatives include no chills, discharge, flank pain, hematuria, hesitancy, nausea, possible pregnancy, sweats or vomiting. She has tried nothing for the symptoms. There is no history of catheterization, kidney stones, recurrent UTIs, a single kidney, urinary stasis or a urological procedure.   Increased bowel Movements: Ongoing for 81month, soft consistency,  Trigger by any food intake. no blood, no undigested food, no mucus, no ABD pain, no bloating. No personal hx of FH of IBS or IBD. No unintentional weight loss or gain.  Immunizations: (TDAP, Hep C screen, Pneumovax, Influenza, zoster)  Health Maintenance  Topic Date Due  . HIV Screening  07/12/2017*  . Pap Smear  09/25/2017  . Tetanus Vaccine  01/04/2026  . Flu Shot  Addressed  *Topic was postponed. The date shown is not the original due date.   Diet:healthy Weight:  Wt Readings from Last 3 Encounters:  07/12/16 275 lb (124.7 kg)  06/08/15 265 lb (120.2 kg)  09/26/14 262 lb 6.4 oz (119 kg)   Exercise:goes to [employer] daily Fall Risk: Fall Risk  07/12/2016  Falls in the past year? No   Home Safety:home with daughter Depression/Suicide: Depression screen [facility] 2/9 07/12/2016 06/08/2015 09/26/2014  Decreased Interest 0 0 2  Down, Depressed, Hopeless 0 0 2  PHQ - 2 Score 0 0 4  Altered sleeping - - 2  Tired, decreased energy - - 1  Change in appetite - - 1  Feeling bad or failure about yourself  - -  0  Trouble concentrating - - 3  Moving slowly or fidgety/restless - - 2  Suicidal thoughts - - 0  PHQ-9 Score - - 13  Difficult doing work/chores - - Very difficult   No flowsheet data found. Pap Smear (every 72yrs for >21-29 without HPV, every 73yrs for >30-80yrs with HPV):up to date Vision:needed Dental:needed Sexual History (birth control, marital status, STD: legally separated, sexually active, IUD present (x96yrs)  Medications and allergies reviewed with patient and updated if appropriate.  Patient Active Problem List   Diagnosis Date Noted  . Obesity 09/26/2014  . Rhinitis, allergic 09/09/2014    Current Outpatient Prescriptions on File Prior to Visit  Medication Sig Dispense Refill  . albuterol (PROVENTIL HFA;VENTOLIN HFA) 108 (90 BASE) MCG/ACT inhaler Inhale 2 puffs into the lungs every 4 (four) hours as needed for wheezing or shortness of breath (cough, shortness of breath or wheezing.). 1 Inhaler 1   No current facility-administered medications on file prior to visit.     Past Medical History:  Diagnosis Date  . Allergy   . Anxiety   . Arthritis     Past Surgical History:  Procedure Laterality Date  . KNEE ARTHROSCOPY Right     Social History   Social History  . Marital status: Legally Separated    Spouse name: N/A  . Number of children: N/A  . Years of education: N/A   Social History Main Topics  . Smoking status: Never  Smoker  . Smokeless tobacco: Never Used  . Alcohol use No  . Drug use: No  . Sexual activity: Yes    Birth control/ protection: IUD   Other Topics Concern  . None   Social History Narrative  . None    Family History  Problem Relation Age of Onset  . Diabetes Father   . Arthritis Father   . Depression Father   . Mental illness Father     PTSD, anxiety  . Arthritis Mother     osteoartthritis  . Kidney disease Maternal Grandmother   . Arthritis Maternal Grandmother   . Prostate cancer Maternal Grandfather   . Arthritis  Maternal Grandfather   . Arthritis Paternal Grandmother   . Diabetes Paternal Grandfather   . Mental illness Paternal Grandfather   . Arthritis Paternal Grandfather         Review of Systems  Constitutional: Negative for chills, fever, malaise/fatigue and weight loss.  HENT: Negative for congestion and sore throat.   Eyes:       Negative for visual changes  Respiratory: Negative for cough and shortness of breath.   Cardiovascular: Negative for chest pain, palpitations and leg swelling.  Gastrointestinal: Negative for abdominal pain, blood in stool, constipation, diarrhea, heartburn, melena, nausea and vomiting.  Genitourinary: Positive for frequency and urgency. Negative for dysuria, flank pain, hematuria and hesitancy.  Musculoskeletal: Negative for falls, joint pain and myalgias.  Skin: Negative for rash.  Neurological: Negative for dizziness, sensory change and headaches.  Endo/Heme/Allergies: Does not bruise/bleed easily.  Psychiatric/Behavioral: Negative for depression, substance abuse and suicidal ideas. The patient is not nervous/anxious and does not have insomnia.     Objective:   Vitals:   07/12/16 0915  BP: 114/80  Pulse: 75  Temp: 97.6 F (36.4 C)    Body mass index is 43.07 kg/Wall.   Physical Examination:  Physical Exam  Constitutional: She is oriented to person, place, and time and well-developed, well-nourished, and in no distress. No distress.  HENT:  Right Ear: External ear normal.  Left Ear: External ear normal.  Nose: Nose normal.  Mouth/Throat: Oropharynx is clear and moist. No oropharyngeal exudate.  Eyes: Conjunctivae and EOM are normal. Pupils are equal, round, and reactive to light. No scleral icterus.  Neck: Normal range of motion. Neck supple. No thyromegaly present.  Cardiovascular: Normal rate, normal heart sounds and intact distal pulses.   Pulmonary/Chest: Effort normal and breath sounds normal. She exhibits no tenderness.  Abdominal:  Soft. Bowel sounds are normal. She exhibits no distension. There is no tenderness.  Musculoskeletal: Normal range of motion. She exhibits no edema or tenderness.  Lymphadenopathy:    She has no cervical adenopathy.  Neurological: She is alert and oriented to person, place, and time. Gait normal.  Skin: Skin is warm and dry.  Psychiatric: Affect and judgment normal.    ASSESSMENT and PLAN:  [name] was seen today for establish care.  Diagnoses and all orders for this visit:  Increased bowel frequency -     saccharomyces boulardii (FLORASTOR) 250 MG capsule; Take 1 capsule (250 mg total) by mouth 2 (two) times daily. -     Stool Culture; Future -     Stool, WBC/Lactoferrin; Future -     CBC w/Diff; Future -     TSH; Future -     Hepatic function panel; Future -     Basic metabolic panel; Future  Morbid obesity with BMI of 40.0-44.9, adult (HCC) -  Lipid panel; Future  Increased urinary frequency -     saccharomyces boulardii (FLORASTOR) 250 MG capsule; Take 1 capsule (250 mg total) by mouth 2 (two) times daily. -     Urinalysis, Routine w reflex microscopic; Future -     CBC w/Diff; Future  Encounter for removal and reinsertion of intrauterine contraceptive device (IUD) -     Ambulatory referral to Gynecology    No problem-specific Assessment & Plan notes found for this encounter.      Follow up: Return in about 3 months (around 10/09/2016) for increased BMs.  Alysia Penna, NP

## 2016-07-12 NOTE — Progress Notes (Signed)
Pre visit review using our clinic review tool, if applicable. No additional management support is needed unless otherwise documented below in the visit note. 

## 2016-08-10 ENCOUNTER — Telehealth: Payer: Self-pay | Admitting: Obstetrics and Gynecology

## 2016-08-10 NOTE — Telephone Encounter (Signed)
Called and left a message for patient to call back to schedule a new patient doctor referral. °

## 2016-08-11 NOTE — Telephone Encounter (Signed)
Called and left a message for patient to call back to schedule a new patient doctor referral. °

## 2016-08-12 NOTE — Telephone Encounter (Signed)
Routing referral back to referring office. Patient has not returned multiple calls to schedule an appointment. ° °

## 2016-08-18 ENCOUNTER — Encounter: Payer: Self-pay | Admitting: Nurse Practitioner

## 2016-08-31 ENCOUNTER — Ambulatory Visit (INDEPENDENT_AMBULATORY_CARE_PROVIDER_SITE_OTHER): Payer: BLUE CROSS/BLUE SHIELD | Admitting: Obstetrics and Gynecology

## 2016-08-31 ENCOUNTER — Encounter: Payer: Self-pay | Admitting: Obstetrics and Gynecology

## 2016-08-31 VITALS — BP 118/70 | HR 80 | Resp 16 | Ht 66.25 in | Wt 267.0 lb

## 2016-08-31 DIAGNOSIS — N898 Other specified noninflammatory disorders of vagina: Secondary | ICD-10-CM | POA: Diagnosis not present

## 2016-08-31 DIAGNOSIS — F419 Anxiety disorder, unspecified: Secondary | ICD-10-CM | POA: Insufficient documentation

## 2016-08-31 DIAGNOSIS — Z01419 Encounter for gynecological examination (general) (routine) without abnormal findings: Secondary | ICD-10-CM

## 2016-08-31 DIAGNOSIS — Z124 Encounter for screening for malignant neoplasm of cervix: Secondary | ICD-10-CM

## 2016-08-31 DIAGNOSIS — L290 Pruritus ani: Secondary | ICD-10-CM | POA: Diagnosis not present

## 2016-08-31 DIAGNOSIS — Z113 Encounter for screening for infections with a predominantly sexual mode of transmission: Secondary | ICD-10-CM

## 2016-08-31 DIAGNOSIS — G43009 Migraine without aura, not intractable, without status migrainosus: Secondary | ICD-10-CM | POA: Insufficient documentation

## 2016-08-31 DIAGNOSIS — M1990 Unspecified osteoarthritis, unspecified site: Secondary | ICD-10-CM | POA: Insufficient documentation

## 2016-08-31 DIAGNOSIS — K602 Anal fissure, unspecified: Secondary | ICD-10-CM | POA: Diagnosis not present

## 2016-08-31 DIAGNOSIS — N943 Premenstrual tension syndrome: Secondary | ICD-10-CM

## 2016-08-31 DIAGNOSIS — T7840XA Allergy, unspecified, initial encounter: Secondary | ICD-10-CM | POA: Insufficient documentation

## 2016-08-31 MED ORDER — BETAMETHASONE VALERATE 0.1 % EX OINT: TOPICAL_OINTMENT | CUTANEOUS | 0 refills | Status: DC

## 2016-08-31 MED ORDER — CITALOPRAM HYDROBROMIDE 20 MG PO TABS: ORAL_TABLET | ORAL | 1 refills | Status: DC

## 2016-08-31 NOTE — Patient Instructions (Addendum)
Increase fruit, fluid and fiber intake You can try magnesium 250-500 mg a day to make your stools soft You can also try metamucil or miralax if needed  EXERCISE AND DIET:  We recommended that you start or continue a regular exercise program for good health. Regular exercise means any activity that makes your heart beat faster and makes you sweat.  We recommend exercising at least 30 minutes per day at least 3 days a week, preferably 4 or 5.  We also recommend a diet low in fat and sugar.  Inactivity, poor dietary choices and obesity can cause diabetes, heart attack, stroke, and kidney damage, among others.    ALCOHOL AND SMOKING:  Women should limit their alcohol intake to no more than 7 drinks/beers/glasses of wine (combined, not each!) per week. Moderation of alcohol intake to this level decreases your risk of breast cancer and liver damage. And of course, no recreational drugs are part of a healthy lifestyle.  And absolutely no smoking or even second hand smoke. Most people know smoking can cause heart and lung diseases, but did you know it also contributes to weakening of your bones? Aging of your skin?  Yellowing of your teeth and nails?  CALCIUM AND VITAMIN D:  Adequate intake of calcium and Vitamin D are recommended.  The recommendations for exact amounts of these supplements seem to change often, but generally speaking 600 mg of calcium (either carbonate or citrate) and 800 units of Vitamin D per day seems prudent. Certain women may benefit from higher intake of Vitamin D.  If you are among these women, your doctor will have told you during your visit.    PAP SMEARS:  Pap smears, to check for cervical cancer or precancers,  have traditionally been done yearly, although recent scientific advances have shown that most women can have pap smears less often.  However, every woman still should have a physical exam from her gynecologist every year. It will include a breast check, inspection of the vulva  and vagina to check for abnormal growths or skin changes, a visual exam of the cervix, and then an exam to evaluate the size and shape of the uterus and ovaries.  And after 28 years of age, a rectal exam is indicated to check for rectal cancers. We will also provide age appropriate advice regarding health maintenance, like when you should have certain vaccines, screening for sexually transmitted diseases, bone density testing, colonoscopy, mammograms, etc.    Anal Fissure, Adult An anal fissure is a small tear or crack in the skin around the anus. Bleeding from a fissure usually stops on its own within a few minutes. However, bleeding will often occur again with each bowel movement until the crack heals. What are the causes? This condition may be caused by:  Passing large, hard stool (feces).  Frequent diarrhea.  Constipation.  Inflammatory bowel disease (Crohn disease or ulcerative colitis).  Infections.  Anal sex. What are the signs or symptoms? Symptoms of this condition include:  Bleeding from the rectum.  Small amounts of blood seen on your stool, on toilet paper, or in the toilet after a bowel movement.  Painful bowel movements.  Itching or irritation around the anus. How is this diagnosed? A health care provider may diagnose this condition by closely examining the anal area. An anal fissure can usually be seen with careful inspection. In some cases, a rectal exam may be performed, or a short tube (anoscope) may be used to examine the anal  canal. How is this treated? Treatment for this condition may include:  Taking steps to avoid constipation. This may include making changes to your diet, such as increasing your intake of fiber or fluid.  Taking fiber supplements. These supplements can soften your stool to help make bowel movements easier. Your health care provider may also prescribe a stool softener if your stool is often hard.  Taking sitz baths. This may help to heal  the tear.  Using medicated creams or ointments. These may be prescribed to lessen discomfort. Follow these instructions at home: Eating and drinking   Avoid foods that may be constipating, such as bananas and dairy products.  Drink enough fluid to keep your urine clear or pale yellow.  Maintain a diet that is high in fruits, whole grains, and vegetables. General instructions   Keep the anal area as clean and dry as possible.  Take sitz baths as told by your health care provider. Do not use soap in the sitz baths.  Take over-the-counter and prescription medicines only as told by your health care provider.  Use creams or ointments only as told by your health care provider.  Keep all follow-up visits as told by your health care provider. This is important. Contact a health care provider if:  You have more bleeding.  You have a fever.  You have diarrhea that is mixed with blood.  You continue to have pain.  Your problem is getting worse rather than better. This information is not intended to replace advice given to you by your health care provider. Make sure you discuss any questions you have with your health care provider. Document Released: 05/23/2005 Document Revised: 09/30/2015 Document Reviewed: 08/18/2014 Elsevier Interactive Patient Education  2017 Elsevier Inc.  Premenstrual Syndrome Premenstrual syndrome (PMS) is a group of physical, emotional, and behavioral symptoms that affect women of childbearing age. PMS starts 1-2 weeks before the start of a woman's period and goes away a few days after the period starts. It often recurs in a predictable pattern. PMS can range from mild to severe. When it is severe, it is called premenstrual dysphoric disorder (PMDD). PMS can interfere in many ways with normal daily activities. What are the causes? The cause of this condition is not known, but it seems to be related to hormone changes that happen before menstruation. What are  the signs or symptoms? Symptoms of this condition often happen every month. They go away completely after your period starts. Physical symptoms include:  Bloating.  Breast pain.  Headaches.  Extreme fatigue.  Backaches.  Swelling of the hands and feet.  Weight gain.  Hot flashes. Emotional and behavioral symptoms include:  Mood swings.  Depression.  Angry outbursts.  Irritability.  Anxiety.  Crying spells.  Food cravings or appetite changes.  Changes in sexual desire.  Confusion.  Aggression.  Social withdrawal.  Poor concentration. How is this diagnosed? This condition is diagnosed if symptoms of PMS:  Are present in the 5 days before your period starts.  End within 4 days after your period starts.  Happen at least 3 months in a row.  Interfere with some of your normal activities. Other conditions that can cause some of these symptoms must be ruled out before PMS can be diagnosed. How is this treated? This condition may be treated by:  Maintaining a healthy lifestyle. This includes eating a balanced diet and exercising regularly.  Taking medicines. Medicines can help relieve symptoms such as cramps, aches, pains, headaches, and breast  tenderness. Depending on the severity of the condition, your health care provider may recommend:  Over-the-counter pain medicines.  Prescription medicines for PMDD. Follow these instructions at home: Eating and drinking    Eat a well-balanced diet.  Avoid caffeine and alcohol.  Limit the amount of salt and salty foods you eat. This will help lessen bloating.  Drink enough fluid to keep your urine clear or pale yellow.  Take a multivitamin if told to by your health care provider. Lifestyle   Do not use any tobacco products, such as cigarettes, chewing tobacco, and e-cigarettes. If you need help quitting, ask your health care provider.  Exercise regularly as suggested by your health care provider.  Get  enough sleep.  Practice relaxation techniques.  Limit stress. Other Instructions   For 2-3 months, write down your symptoms, their severity, and how long they last. This will help your health care provider choose the best treatment for you.  Take over-the-counter and prescription medicines only as told by your health care provider.  If you are using oral contraceptive pills, use them as told by your health care provider. This information is not intended to replace advice given to you by your health care provider. Make sure you discuss any questions you have with your health care provider. Document Released: 05/20/2000 Document Revised: 06/24/2015 Document Reviewed: 02/20/2015 Elsevier Interactive Patient Education  2017 [employer]

## 2016-08-31 NOTE — Progress Notes (Signed)
26 y.o. G1P1001 Legally SeparatedCaucasianF here to discuss her IUD.  She had a mirena placed 4.5 years ago. She c/o mood swings, change in bleeding in the last year. She c/o bad PMS the week prior to her cycle, sometimes 2 weeks. She gets teary, sad, angry. Initially she had minimal bleeding on a monthly basis. She has had an increase in clear, slimy vaginal d/c since insertion.  Over the last year her cycles have been coming every 3-4 weeks, heavier than it was. The cramps are new again in the last year. Prior to the IUD she had bad cramps, but they initially resolved with the IUD.  Sexually active, no pain with intercourse. She intermittently gets cramping after intercourse. No current partner.  She c/o intermittent flaring with hemorrhoids. She denies any lumps, main symptom is itching, sometimes burns after a BM. Sometimes it hurts to have a BM. She has a BM every day, some are harder than others. Has loose stool prior to her menses She does notice an increase odor when she has her cycle.  Period Duration (Days): 4-6 days  Period Pattern: (!) Irregular Menstrual Flow: Light, Moderate Menstrual Control: Maxi pad, Thin pad Menstrual Control Change Freq (Hours): changes pad every 4-5 hours  Dysmenorrhea: (!) Moderate Dysmenorrhea Symptoms: Cramping  Patient's last menstrual period was 08/21/2016.          Sexually active: Yes.    The current method of family planning is IUD.    Exercising: Yes.    cardio/weights  Smoker:  no  Health Maintenance: Pap:  09-26-14 WNL  History of abnormal Pap:  Yes 2010, no surgery on her cervix.  TDaP:  01/2016 Gardasil: completed all 3    reports that she has never smoked. She has never used smokeless tobacco. She reports that she does not drink alcohol or use drugs.Daughter is almost 5, lives with her full time. 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   Past Medical History:  Diagnosis Date  . Allergy    . Anxiety   . Arthritis   Anxiety is worse the week prior to her cycle. Crowds can also make her anxious.   Past Surgical History:  Procedure Laterality Date  . KNEE ARTHROSCOPY Right     Current Outpatient Prescriptions  Medication Sig Dispense Refill  . albuterol (PROVENTIL HFA;VENTOLIN HFA) 108 (90 BASE) MCG/ACT inhaler Inhale 2 puffs into the lungs every 4 (four) hours as needed for wheezing or shortness of breath (cough, shortness of breath or wheezing.). 1 Inhaler 1  . levonorgestrel (MIRENA) 20 MCG/24HR IUD 1 each by Intrauterine route once.     No current facility-administered medications for this visit.     Family History  Problem Relation Age of Onset  . Diabetes Father   . Arthritis Father   . Depression Father   . Mental illness Father     PTSD, anxiety  . Arthritis Mother     osteoartthritis  . Kidney disease Maternal Grandmother   . Arthritis Maternal Grandmother   . Prostate cancer Maternal Grandfather   . Arthritis Maternal Grandfather   . Arthritis Paternal Grandmother   . Diabetes Paternal Grandfather   . Mental illness Paternal Grandfather   . Arthritis Paternal Grandfather     Review of Systems  Constitutional: Negative.   HENT: Negative.   Eyes: Negative.   Respiratory: Negative.   Cardiovascular: Negative.   Gastrointestinal: Negative.   Endocrine: Negative.  Genitourinary: Negative.        Irregular menstrual cycles   Musculoskeletal: Negative.   Skin: Negative.   Allergic/Immunologic: Negative.   Neurological: Negative.   Psychiatric/Behavioral: Negative.     Exam:   BP 118/70 (BP Location: Right Arm, Patient Position: Sitting, Cuff Size: Normal)   Pulse 80   Resp 16   Ht 5' 6.25" (1.683 m)   Wt 267 lb (121.1 kg)   LMP 08/21/2016   BMI 42.77 kg/m   Weight change: @WEIGHTCHANGE @ Height:   Height: 5' 6.25" (168.3 cm)  Ht Readings from Last 3 Encounters:  08/31/16 5' 6.25" (1.683 m)  07/12/16 5\' 7"  (1.702 m)  06/08/15 5\' 7"   (1.702 m)    General appearance: alert, cooperative and appears stated age Head: Normocephalic, without obvious abnormality, atraumatic Neck: no adenopathy, supple, symmetrical, trachea midline and thyroid normal to inspection and palpation Lungs: clear to auscultation bilaterally Cardiovascular: regular rate and rhythm Breasts: normal appearance, no masses or tenderness Abdomen: soft, non-tender; bowel sounds normal; no masses,  no organomegaly Extremities: extremities normal, atraumatic, no cyanosis or edema Skin: Skin color, texture, turgor normal. No rashes or lesions Lymph nodes: Cervical, supraclavicular, and axillary nodes normal. No abnormal inguinal nodes palpated Neurologic: Grossly normal   Pelvic: External genitalia:  no lesions              Urethra:  normal appearing urethra with no masses, tenderness or lesions              Bartholins and Skenes: normal                 Vagina: normal appearing vagina with normal color and discharge, no lesions              Cervix: no lesions and IUD string 1 cm               Bimanual Exam:  Uterus:  normal size, contour, position, consistency, mobility, non-tender and anteverted              Adnexa: no mass, fullness, tenderness               Rectovaginal:deferred               Anus:  Perianal irritation, erythematous, slight whitening. Anal fissure at 12 o'clock  Chaperone was present for exam.  A:  Well Woman with normal exam  Worsening PMS  Perianal itching  Anal fissure  Mirena IUD, due for removal in the fall. She has been bleeding heavier, but still WNL  Vaginal odor  P:   Pap with hpv  GC/Chlamydia  Declines other STD testing  Discussed her difficulty with keeping the perianal area clean  Will treat perianal dermatitis with steroid ointment  Discussed using Vaseline on a regular basis to prevent irritation  Discussed preventing constipation, needs to do this so the fissure can heal.   Other labs with her primary  We  discussed the option of OCP's or SSRI to treat PMS. She would like to try a SSRI  F/U in 1 month  Discussed the option of changing her IUD early if the bleeding is worsening, for now she wants to keep it  Vaginitis probe  CC: Anne Ngharlotte Lum [name], NP

## 2016-09-01 ENCOUNTER — Other Ambulatory Visit: Payer: Self-pay | Admitting: *Deleted

## 2016-09-01 LAB — WET PREP BY MOLECULAR PROBE
CANDIDA SPECIES: NOT DETECTED
Gardnerella vaginalis: DETECTED — AB
Trichomonas vaginosis: NOT DETECTED

## 2016-09-01 LAB — IPS PAP TEST WITH REFLEX TO HPV

## 2016-09-01 MED ORDER — METRONIDAZOLE 500 MG PO TABS: 500.0000 mg | ORAL_TABLET | Freq: Two times a day (BID) | ORAL | 0 refills | Status: DC

## 2016-09-02 LAB — IPS N GONORRHOEA AND CHLAMYDIA BY PCR

## 2016-09-14 ENCOUNTER — Telehealth: Payer: Self-pay | Admitting: Nurse Practitioner

## 2016-09-14 NOTE — Telephone Encounter (Signed)
appt set

## 2016-09-14 NOTE — Telephone Encounter (Signed)
Pt called wanting to know if she could have a referral to an orthopedic doctor. She said that she is having some issues with her right knee. It has a "grinding" feeling and she said that she also has sharp pain that goes down to her ankle. Could we send over a referral for her or will she need to come in for an appointment first? Please advise.

## 2016-09-15 ENCOUNTER — Encounter: Payer: Self-pay | Admitting: Nurse Practitioner

## 2016-09-15 ENCOUNTER — Ambulatory Visit (INDEPENDENT_AMBULATORY_CARE_PROVIDER_SITE_OTHER)
Admission: RE | Admit: 2016-09-15 | Discharge: 2016-09-15 | Disposition: A | Discharge status: Home / Self Care | Payer: BLUE CROSS/BLUE SHIELD | Source: Ambulatory Visit | Attending: Nurse Practitioner | Admitting: Nurse Practitioner

## 2016-09-15 ENCOUNTER — Ambulatory Visit (INDEPENDENT_AMBULATORY_CARE_PROVIDER_SITE_OTHER): Payer: BLUE CROSS/BLUE SHIELD | Admitting: Nurse Practitioner

## 2016-09-15 VITALS — BP 118/70 | HR 72 | Temp 97.9°F | Ht 66.25 in | Wt 269.0 lb

## 2016-09-15 DIAGNOSIS — M25561 Pain in right knee: Secondary | ICD-10-CM

## 2016-09-15 NOTE — Progress Notes (Signed)
Subjective:  Patient ID: Carmen Wall, female    [place] 03-Aug-1990  Age: 26 y.o. MRN: [id number]  CC: Knee Pain (knee pain down to ankle getting bad for 1 wk--had knee surgery 2008. need referral)   Knee Pain   The incident occurred more than 1 week ago. There was no injury mechanism. The pain is present in the right knee. The quality of the pain is described as aching, burning and shooting. The pain is at a severity of 10/10. The pain is severe. The pain has been fluctuating since onset. Associated symptoms include an inability to bear weight. Pertinent negatives include no loss of motion, loss of sensation, muscle weakness, numbness or tingling. The symptoms are aggravated by weight bearing, palpation and movement. She has tried NSAIDs for the symptoms. The treatment provided moderate relief.   Hx of Meniscus surgery 2008 Worsening knee pain in last 14month, radiating to ankle. Current job entails prolong walking and standing. 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  Outpatient Medications Prior to Visit  Medication Sig Dispense Refill  . albuterol (PROVENTIL HFA;VENTOLIN HFA) 108 (90 BASE) MCG/ACT inhaler Inhale 2 puffs into the lungs every 4 (four) hours as needed for wheezing or shortness of breath (cough, shortness of breath or wheezing.). 1 Inhaler 1  . betamethasone valerate ointment (VALISONE) 0.1 % Apply a pea sized amount topically BID for 1-2 weeks 15 g 0  . citalopram (CELEXA) 20 MG tablet Take 1/2 a tablet daily x 1 week, if tolerating increase to 1 tablet po qd 30 tablet 1  . levonorgestrel (MIRENA) 20 MCG/24HR IUD 1 each by Intrauterine route once.    . metroNIDAZOLE (FLAGYL) 500 MG tablet Take 1 tablet (500 mg total) by mouth 2 (two) times daily. 14 tablet 0   No facility-administered medications prior to visit.     ROS See HPI  Objective:  BP 118/70   Pulse 72   Temp 97.9 F (36.6 C)    Ht 5' 6.25" (1.683 m)   Wt 269 lb (122 kg)   LMP 09/14/2016   SpO2 99%   BMI 43.09 kg/m   BP Readings from Last 3 Encounters:  09/15/16 118/70  08/31/16 118/70  07/12/16 114/80    Wt Readings from Last 3 Encounters:  09/15/16 269 lb (122 kg)  08/31/16 267 lb (121.1 kg)  07/12/16 275 lb (124.7 kg)    Physical Exam  Constitutional: She is oriented to person, place, and time. No distress.  Cardiovascular: Normal rate.   Pulmonary/Chest: Effort normal.  Musculoskeletal: She exhibits tenderness. She exhibits no edema.       Right knee: She exhibits effusion and bony tenderness. She exhibits normal range of motion, no swelling, no ecchymosis, no deformity, no erythema and normal patellar mobility. Tenderness found. Lateral joint line tenderness noted. No medial joint line and no patellar tendon tenderness noted.  Neurological: She is alert and oriented to person, place, and time.  Vitals reviewed.   Lab Results  Component Value Date   WBC 5.7 07/12/2016   HGB 14.2 07/12/2016   HCT 41.8 07/12/2016   PLT 225.0 07/12/2016   GLUCOSE 86 07/12/2016   CHOL 214 (H) 07/12/2016   TRIG 123.0 07/12/2016   HDL 50.20 07/12/2016   LDLCALC 140 (H) 07/12/2016   ALT 20 07/12/2016   AST 20 07/12/2016   NA 140 07/12/2016   K 5.1 07/12/2016   CL 106  07/12/2016   CREATININE 0.76 07/12/2016   BUN 13 07/12/2016   CO2 30 07/12/2016   TSH 0.82 07/12/2016    Ct Renal Stone Study  Result Date: 07/25/2015 CLINICAL DATA:  Worsening right abdominal pain since this morning. Cloudy urine this morning. Nausea and weakness. EXAM: CT ABDOMEN AND PELVIS WITHOUT CONTRAST TECHNIQUE: Multidetector CT imaging of the abdomen and pelvis was performed following the standard protocol without IV contrast. COMPARISON:  None. FINDINGS: Lower chest:  No acute findings. Hepatobiliary: Normal appearing liver and gallbladder. Pancreas: No mass or inflammatory process identified on this un-enhanced exam. Spleen: Within  normal limits in size. Adrenals/Urinary Tract: No evidence of urolithiasis or hydronephrosis. No definite mass visualized on this un-enhanced exam. Stomach/Bowel: No gastrointestinal abnormalities. Normal appearing appendix containing some calcific density. Vascular/Lymphatic: No pathologically enlarged lymph nodes. No evidence of abdominal aortic aneurysm. Reproductive: Intrauterine device in the uterus. Normal appearing ovaries. Other: None. Musculoskeletal:  No suspicious bone lesions identified. IMPRESSION: Normal examination. Electronically Signed   By: Beckie Salts M.D.   On: 07/25/2015 11:59    Assessment & Plan:   Tonika was seen today for knee pain.  Diagnoses and all orders for this visit:  Lateral knee pain, right -     DG Knee Complete 4 Views Right; Future -     AMB referral to orthopedics   I am having Ms. [place] maintain her albuterol, levonorgestrel, citalopram, betamethasone valerate ointment, and metroNIDAZOLE.  No orders of the defined types were placed in this encounter.   Follow-up: Return if symptoms worsen or fail to improve.  Alysia Penna, NP

## 2016-09-15 NOTE — Patient Instructions (Addendum)
Go to basement for knee x-ray.  She decline NSAIDs prescription.  You will be called with appt to ortho.  Patient given Hinged knee brace in office today.

## 2016-09-15 NOTE — Progress Notes (Signed)
Pre visit review using our clinic review tool, if applicable. No additional management support is needed unless otherwise documented below in the visit note. 

## 2016-10-04 ENCOUNTER — Ambulatory Visit (INDEPENDENT_AMBULATORY_CARE_PROVIDER_SITE_OTHER): Payer: BLUE CROSS/BLUE SHIELD | Admitting: Obstetrics and Gynecology

## 2016-10-04 ENCOUNTER — Encounter: Payer: Self-pay | Admitting: Obstetrics and Gynecology

## 2016-10-04 ENCOUNTER — Ambulatory Visit (INDEPENDENT_AMBULATORY_CARE_PROVIDER_SITE_OTHER): Payer: BLUE CROSS/BLUE SHIELD | Admitting: Orthopaedic Surgery

## 2016-10-04 ENCOUNTER — Encounter (INDEPENDENT_AMBULATORY_CARE_PROVIDER_SITE_OTHER): Payer: Self-pay | Admitting: Orthopaedic Surgery

## 2016-10-04 VITALS — BP 130/70 | HR 84 | Resp 16 | Wt 270.0 lb

## 2016-10-04 DIAGNOSIS — B9689 Other specified bacterial agents as the cause of diseases classified elsewhere: Secondary | ICD-10-CM

## 2016-10-04 DIAGNOSIS — M25561 Pain in right knee: Secondary | ICD-10-CM

## 2016-10-04 DIAGNOSIS — N943 Premenstrual tension syndrome: Secondary | ICD-10-CM

## 2016-10-04 DIAGNOSIS — N760 Acute vaginitis: Secondary | ICD-10-CM | POA: Diagnosis not present

## 2016-10-04 MED ORDER — METRONIDAZOLE 0.75 % VA GEL
1.0000 | Freq: Every day | VAGINAL | 0 refills | Status: DC
Start: 2016-10-04 — End: 2017-04-04

## 2016-10-04 NOTE — Progress Notes (Signed)
Office Visit Note   Patient: Carmen Wall [place]           Date of Birth: 09-20-1990           MRN: [id number] Visit Date: 10/04/2016              Requested by: Anne Ng, NP [address] [place], [place] [address] PCP: Alysia Penna, NP   Assessment & Plan: Visit Diagnoses:  1. Acute pain of right knee     Plan: Overall impression is possible recurrent lateral meniscal tear. MRI of the right knee ordered. Follow-up after the MRI.  Follow-Up Instructions: Return in about 10 days (around 10/14/2016).   Orders:  No orders of the defined types were placed in this encounter.  No orders of the defined types were placed in this encounter.     Procedures: No procedures performed   Clinical Data: No additional findings.   Subjective: Chief Complaint  Patient presents with  . Right Knee - Pain    Patient is a very pleasant 26 year old female who is 2 month history of right lateral knee pain that radiates down into the ankle case. She is status post right knee arthroscopy in 2008 lateral meniscal tear. She states she has similar symptoms but worse in severity. She endorses mechanical symptoms with catching and locking and giving way. She does endorse swelling. She denies any injuries. She works as a Corporate treasurer for [employer]    Review of Systems  Constitutional: Negative.   HENT: Negative.   Eyes: Negative.   Respiratory: Negative.   Cardiovascular: Negative.   Endocrine: Negative.   Musculoskeletal: Negative.   Neurological: Negative.   Hematological: Negative.   Psychiatric/Behavioral: Negative.   All other systems reviewed and are negative.    Objective: Vital Signs: LMP 09/14/2016   Physical Exam  Constitutional: She is oriented to person, place, and time. She appears well-developed and well-nourished.  HENT:  Head: Normocephalic and atraumatic.  Eyes: EOM are normal.  Neck: Neck supple.  Pulmonary/Chest: Effort normal.    Abdominal: Soft.  Neurological: She is alert and oriented to person, place, and time.  Skin: Skin is warm. Capillary refill takes less than 2 seconds.  Psychiatric: She has a normal mood and affect. Her behavior is normal. Judgment and thought content normal.  Nursing note and vitals reviewed.   Ortho Exam Right knee exam shows no obvious joint effusion. Range of motion is normal. She does have lateral joint line tenderness and pain with McMurray testing. Collaterals and cruciates are stable. No radicular symptoms. Specialty Comments:  No specialty comments available.  Imaging: No results found.   PMFS History: Patient Active Problem List   Diagnosis Date Noted  . Migraine without aura   . Arthritis   . Anxiety   . Allergy   . Increased bowel frequency 07/12/2016  . Increased urinary frequency 07/12/2016  . Obesity 09/26/2014  . Rhinitis, allergic 09/09/2014   Past Medical History:  Diagnosis Date  . Allergy   . Anxiety   . Arthritis   . Migraine without aura     Family History  Problem Relation Age of Onset  . Diabetes Father   . Arthritis Father   . Depression Father   . Mental illness Father     PTSD, anxiety  . Arthritis Mother     osteoartthritis  . Kidney disease Maternal Grandmother   . Arthritis Maternal Grandmother   . Prostate cancer Maternal Grandfather   . Arthritis  Maternal Grandfather   . Arthritis Paternal Grandmother   . Diabetes Paternal Grandfather   . Mental illness Paternal Grandfather   . Arthritis Paternal Grandfather     Past Surgical History:  Procedure Laterality Date  . KNEE ARTHROSCOPY Right    Social History   Occupational History  . Not on file.   Social History Main Topics  . Smoking status: Never Smoker  . Smokeless tobacco: Never Used  . Alcohol use No  . Drug use: No  . Sexual activity: Yes    Partners: Male    Birth control/ protection: IUD

## 2016-10-04 NOTE — Progress Notes (Signed)
GYNECOLOGY  VISIT   HPI: 26 y.o.   Legally Separated  Caucasian  female   G1P1001 with Patient's last menstrual period was 09/14/2016.   here for follow up PMS and medication. She was started on Celexa and flagyl after her last visit. She stopped both of the medications after a few days. She felt sleepy and had a stomach ache with nausea and diarrhea.      GYNECOLOGIC HISTORY: Patient's last menstrual period was 09/14/2016. Contraception:IUD (Mirena)  Menopausal hormone therapy: none         OB History    Gravida Para Term Preterm AB Living   SAB TAB Ectopic Multiple Live Births           1         Patient Active Problem List   Diagnosis Date Noted  . Migraine without aura   . Arthritis   . Anxiety   . Allergy   . Increased bowel frequency 07/12/2016  . Increased urinary frequency 07/12/2016  . Obesity 09/26/2014  . Rhinitis, allergic 09/09/2014    Past Medical History:  Diagnosis Date  . Allergy   . Anxiety   . Arthritis   . Migraine without aura     Past Surgical History:  Procedure Laterality Date  . KNEE ARTHROSCOPY Right     Current Outpatient Prescriptions  Medication Sig Dispense Refill  . albuterol (PROVENTIL HFA;VENTOLIN HFA) 108 (90 BASE) MCG/ACT inhaler Inhale 2 puffs into the lungs every 4 (four) hours as needed for wheezing or shortness of breath (cough, shortness of breath or wheezing.). 1 Inhaler 1  . betamethasone valerate ointment (VALISONE) 0.1 % Apply a pea sized amount topically BID for 1-2 weeks 15 g 0  . levonorgestrel (MIRENA) 20 MCG/24HR IUD 1 each by Intrauterine route once.     No current facility-administered medications for this visit.      ALLERGIES: Patient has no known allergies.  Family History  Problem Relation Age of Onset  . Diabetes Father   . Arthritis Father   . Depression Father   . Mental illness Father     PTSD, anxiety  . Arthritis Mother     osteoartthritis  . Kidney disease Maternal Grandmother    . Arthritis Maternal Grandmother   . Prostate cancer Maternal Grandfather   . Arthritis Maternal Grandfather   . Arthritis Paternal Grandmother   . Diabetes Paternal Grandfather   . Mental illness Paternal Grandfather   . Arthritis Paternal Grandfather     Social History   Social History  . Marital status: Legally Separated    Spouse name: N/A  . Number of children: N/A  . Years of education: N/A   Occupational History  . Not on file.   Social History Main Topics  . Smoking status: Never Smoker  . Smokeless tobacco: Never Used  . Alcohol use No  . Drug use: No  . Sexual activity: Yes    Partners: Male    Birth control/ protection: IUD   Other Topics Concern  . Not on file   Social History Narrative  . No narrative on file    Review of Systems  Constitutional: Negative.   HENT: Negative.   Eyes: Negative.   Respiratory: Negative.   Cardiovascular: Negative.   Gastrointestinal: Negative.   Genitourinary: Negative.   Musculoskeletal: Negative.   Skin: Negative.   Neurological: Negative.   Endo/Heme/Allergies: Negative.   Psychiatric/Behavioral: Positive for depression. The  patient is nervous/anxious.        Moody     PHYSICAL EXAMINATION:    BP 130/70 (BP Location: Right Arm, Patient Position: Sitting, Cuff Size: Normal)   Pulse 84   Resp 16   Wt 270 lb (122.5 kg)   LMP 09/14/2016   BMI 43.25 kg/m     General appearance: alert, cooperative and appears stated age  ASSESSMENT PMS, didn't continue the medication secondary to side effects from flagyl and celexa, didn't know which side effect was from which BV, has symptoms around her cycle, didn't tolerate the oral flagyl Likely sleepy from the Celexa, was taking it at bedtime and was sleepy until noon the next day. She was only taking 1/2 of a dose.     PLAN Will treat the BV with metrogel Restart the Celexa at 1/2 a dose (10 mg), will try taking the pill in the late afternoon   An After Visit  Summary was printed and given to the patient.

## 2016-10-12 ENCOUNTER — Ambulatory Visit
Admission: RE | Admit: 2016-10-12 | Discharge: 2016-10-12 | Disposition: A | Discharge status: Home / Self Care | Payer: BLUE CROSS/BLUE SHIELD | Source: Ambulatory Visit | Attending: Orthopaedic Surgery | Admitting: Orthopaedic Surgery

## 2016-10-12 DIAGNOSIS — M25561 Pain in right knee: Secondary | ICD-10-CM

## 2016-10-14 ENCOUNTER — Ambulatory Visit: Payer: BLUE CROSS/BLUE SHIELD | Admitting: Nurse Practitioner

## 2016-10-17 ENCOUNTER — Ambulatory Visit (INDEPENDENT_AMBULATORY_CARE_PROVIDER_SITE_OTHER): Payer: BLUE CROSS/BLUE SHIELD | Admitting: Orthopaedic Surgery

## 2016-10-17 DIAGNOSIS — M25561 Pain in right knee: Secondary | ICD-10-CM | POA: Diagnosis not present

## 2016-10-17 NOTE — Progress Notes (Signed)
Office Visit Note   Patient: Carmen Wall           Date of Birth: 1991/03/21           MRN: [id number] Visit Date: 10/17/2016              Requested by: Anne Ng, NP [address] [place], [place] [address] PCP: Anne Ng, NP   Assessment & Plan: Visit Diagnoses:  1. Acute pain of right knee     Plan: MRI shows a small tear of the lateral meniscus but also severe fissuring of her cartilage of the lateral patellar facet. I think she is having more problems from the patellofemoral compartment then the lateral meniscus. I recommended physical therapy for quad strengthening, knee brace, weight loss, modified work for 6 weeks. We did also perform an right knee injection. She did have a vasovagal event she was stable afterwards. She was discharged from the office in stable condition.  Follow-Up Instructions: Return if symptoms worsen or fail to improve.   Orders:  No orders of the defined types were placed in this encounter.  No orders of the defined types were placed in this encounter.     Procedures: Large Joint Inj Date/Time: 10/17/2016 9:11 AM Performed by: Tarry Kos Authorized by: Tarry Kos   Consent Given by:  Patient Timeout: prior to procedure the correct patient, procedure, and site was verified   Indications:  Pain Location:  Knee Site:  R knee Prep: patient was prepped and draped in usual sterile fashion   Needle Size:  22 G Ultrasound Guidance: No   Fluoroscopic Guidance: No   Arthrogram: No   Patient tolerance:  Patient tolerated the procedure well with no immediate complications     Clinical Data: No additional findings.   Subjective: Chief Complaint  Patient presents with  . Right Knee - Pain, Follow-up    Patient comes back today for review her MRI. She continues to have mainly lateral patellar pain. 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 She denies any mechanical symptoms.    Review of  Systems   Objective: Vital Signs: There were no vitals taken for this visit.  Physical Exam  Ortho Exam Right knee exam shows no joint effusion. She is mainly pain that refers to around the lateral patellar region Specialty Comments:  No specialty comments available.  Imaging: No results found.   PMFS History: Patient Active Problem List   Diagnosis Date Noted  . Migraine without aura   . Arthritis   . Anxiety   . Allergy   . Increased bowel frequency 07/12/2016  . Increased urinary frequency 07/12/2016  . Obesity 09/26/2014  . Rhinitis, allergic 09/09/2014   Past Medical History:  Diagnosis Date  . Allergy   . Anxiety   . Arthritis   . Migraine without aura     Family History  Problem Relation Age of Onset  . Diabetes Father   . Arthritis Father   . Depression Father   . Mental illness Father        PTSD, anxiety  . Arthritis Mother        osteoartthritis  . Kidney disease Maternal Grandmother   . Arthritis Maternal Grandmother   . Prostate cancer Maternal Grandfather   . Arthritis Maternal Grandfather   . Arthritis Paternal Grandmother   . Diabetes Paternal Grandfather   . Mental illness Paternal Grandfather   . Arthritis Paternal Grandfather  Past Surgical History:  Procedure Laterality Date  . KNEE ARTHROSCOPY Right    Social History   Occupational History  . Not on file.   Social History Main Topics  . Smoking status: Never Smoker  . Smokeless tobacco: Never Used  . Alcohol use No  . Drug use: No  . Sexual activity: Yes    Partners: Male    Birth control/ protection: IUD

## 2016-10-18 ENCOUNTER — Other Ambulatory Visit: Payer: BLUE CROSS/BLUE SHIELD

## 2016-11-09 ENCOUNTER — Ambulatory Visit: Payer: BLUE CROSS/BLUE SHIELD | Admitting: Obstetrics and Gynecology

## 2016-11-28 ENCOUNTER — Ambulatory Visit (INDEPENDENT_AMBULATORY_CARE_PROVIDER_SITE_OTHER): Payer: BLUE CROSS/BLUE SHIELD | Admitting: Family Medicine

## 2016-11-28 ENCOUNTER — Ambulatory Visit: Payer: BLUE CROSS/BLUE SHIELD | Admitting: Family Medicine

## 2016-11-28 ENCOUNTER — Encounter: Payer: Self-pay | Admitting: Family Medicine

## 2016-11-28 VITALS — BP 130/70 | HR 81 | Temp 98.4°F | Resp 14 | Ht 66.25 in | Wt 275.0 lb

## 2016-11-28 DIAGNOSIS — N926 Irregular menstruation, unspecified: Secondary | ICD-10-CM | POA: Diagnosis not present

## 2016-11-28 DIAGNOSIS — R350 Frequency of micturition: Secondary | ICD-10-CM

## 2016-11-28 DIAGNOSIS — B349 Viral infection, unspecified: Secondary | ICD-10-CM | POA: Diagnosis not present

## 2016-11-28 LAB — POC URINALSYSI DIPSTICK (AUTOMATED)
Bilirubin, UA: NEGATIVE
GLUCOSE UA: NEGATIVE
Ketones, UA: NEGATIVE
Leukocytes, UA: NEGATIVE
Nitrite, UA: NEGATIVE
Protein, UA: NEGATIVE
UROBILINOGEN UA: 0.2 U/dL
pH, UA: 6 (ref 5.0–8.0)

## 2016-11-28 LAB — POCT URINE PREGNANCY: PREG TEST UR: NEGATIVE

## 2016-11-28 NOTE — Progress Notes (Signed)
Subjective:    Patient ID: Carmen Wall [place], female    DOB: 02-Mar-1991, 26 y.o.   MRN: [id number]  HPI This is a 26 yo female who presents today abdominal cramps and frequent urination x 7 days. Over last 4 days has had muscle aches, hot/cold. Congested, some sore throat, occasional dry cough.  Due to start menses any day. Has IUD. Has had more cramps and breast tenderness than usual.   Past Medical History:  Diagnosis Date  . Allergy   . Anxiety   . Arthritis   . Migraine without aura    Past Surgical History:  Procedure Laterality Date  . KNEE ARTHROSCOPY Right    Family History  Problem Relation Age of Onset  . Diabetes Father   . Arthritis Father   . Depression Father   . Mental illness Father        PTSD, anxiety  . Arthritis Mother        osteoartthritis  . Kidney disease Maternal Grandmother   . Arthritis Maternal Grandmother   . Prostate cancer Maternal Grandfather   . Arthritis Maternal Grandfather   . Arthritis Paternal Grandmother   . Diabetes Paternal Grandfather   . Mental illness Paternal Grandfather   . Arthritis Paternal Grandfather    Social History  Substance Use Topics  . Smoking status: Never Smoker  . Smokeless tobacco: Never Used  . Alcohol use No      Review of Systems  Constitutional: Positive for chills, diaphoresis and fatigue.  HENT: Positive for congestion and sore throat. Negative for ear pain.   Respiratory: Positive for cough.   Gastrointestinal: Positive for abdominal pain (cramps).  Genitourinary: Positive for frequency. Negative for dysuria and vaginal discharge.  Musculoskeletal: Positive for back pain (ususally with menses).       Objective:   Physical Exam  Constitutional: She is oriented to person, place, and time. She appears well-developed and well-nourished. No distress.  HENT:  Head: Normocephalic and atraumatic.  Right Ear: Tympanic membrane, external ear and ear canal normal.  Left Ear: Tympanic membrane,  external ear and ear canal normal.  Nose: Nose normal.  Mouth/Throat: Oropharynx is clear and moist. No oropharyngeal exudate.  Eyes: Conjunctivae are normal. Right eye exhibits no discharge. Left eye exhibits no discharge.  Neck: Normal range of motion. Neck supple.  Cardiovascular: Normal rate, regular rhythm and normal heart sounds.   Pulmonary/Chest: Effort normal and breath sounds normal.  Abdominal: There is no CVA tenderness.  Neurological: She is alert and oriented to person, place, and time.  Skin: Skin is warm and dry. She is not diaphoretic.  Psychiatric: She has a normal mood and affect. Her behavior is normal. Judgment and thought content normal.  Vitals reviewed.        BP 130/70 (BP Location: Left Arm, Patient Position: Sitting, Cuff Size: Normal)   Pulse 81   Temp 98.4 F (36.9 C) (Oral)   Resp 14   Ht 5' 6.25" (1.683 Wall)   Wt 275 lb (124.7 kg)   LMP 11/02/2016 (Approximate)   SpO2 98%   BMI 44.05 kg/Wall  Wt Readings from Last 3 Encounters:  11/28/16 275 lb (124.7 kg)  10/04/16 270 lb (122.5 kg)  09/15/16 269 lb (122 kg)    Results for orders placed or performed in visit on 11/28/16  POCT Urinalysis Dipstick (Automated)  Result Value Ref Range   Color, UA dark yellow    Clarity, UA clear    Glucose, UA negative  Bilirubin, UA negative    Ketones, UA negative    Spec Grav, UA >=1.030 (A) 1.010 - 1.025   Blood, UA 2+    pH, UA 6.0 5.0 - 8.0   Protein, UA negative    Urobilinogen, UA 0.2 0.2 or 1.0 E.U./dL   Nitrite, UA negative    Leukocytes, UA Negative Negative    Assessment & Plan:  1. Increased urinary frequency - urine without nitrites/leukocytes, some blood, ? Related to menses - urine very concentrated, can be causing frequency, instructed her to increase water intake until urine light yellow - POCT Urinalysis Dipstick (Automated)  2. Viral illness - discussed symptomatic treatment  3. Missed period - has IUD but has recent increased  breast tenderness and fatigue - POCT urine pregnancy- negative - recheck urine pregnancy if no menses in 1 week (can do at home pregnancy test)    Olean Reeeborah Aubrii Sharpless, FNP-BC  [facility] [place], [facility]  11/28/2016 9:54 AM

## 2016-11-28 NOTE — Patient Instructions (Signed)
Can try over the counter zyrtec in children's dose  Tylenol or ibuprofen for aches/pain, warm salt water gargles  Increase water intake until urine is light yellow

## 2017-03-04 IMAGING — CT CT RENAL STONE PROTOCOL
2 of 3 series · 17 of 39 positions shown, 19 images · non-contrast
Comparison: None.

CLINICAL DATA: Worsening right abdominal pain since this morning.
Cloudy urine this morning. Nausea and weakness.

EXAM:
CT ABDOMEN AND PELVIS WITHOUT CONTRAST
TECHNIQUE: Multidetector CT imaging of the abdomen and pelvis was performed
following the standard protocol without IV contrast.

[Series 3: coronal · coronal · 0.74mm/px · 3 of 127 slices shown]
[im 43/127  soft-tissue]
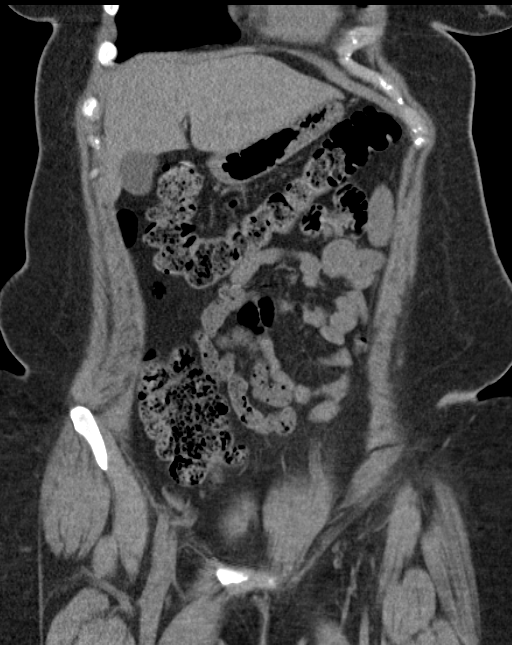
[im 57/127  soft-tissue]
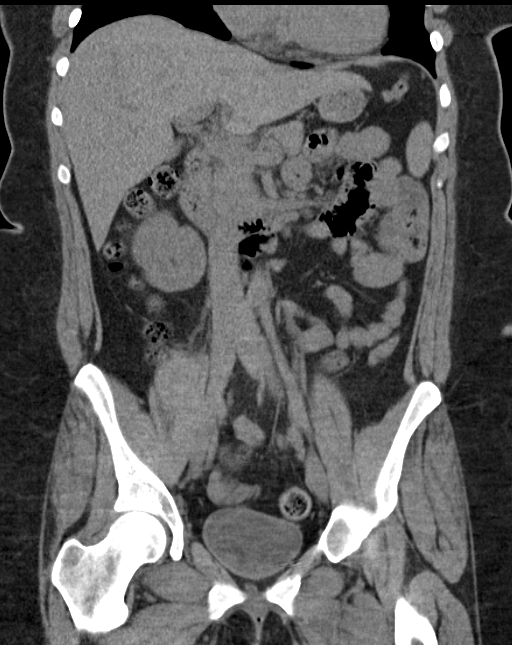
[im 71/127  soft-tissue]
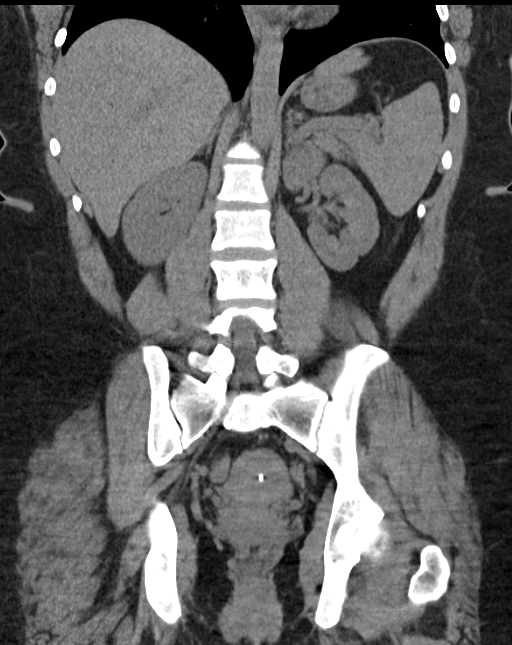

[Series 6: lung · axial · 0.79mm/px · z∈[+104,+214]mm · 14 of 25 slices shown, 16 images]
[im 2/25  soft-tissue]
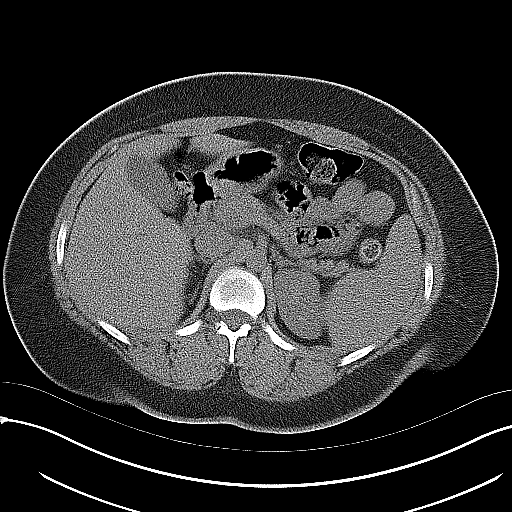
[im 2/25  bone]
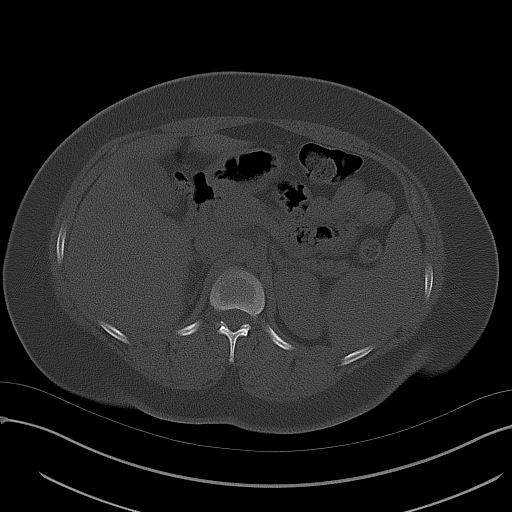
[im 4/25  soft-tissue]
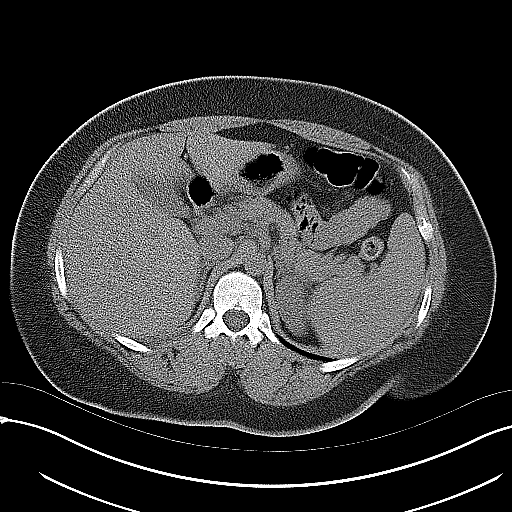
[im 6/25  soft-tissue]
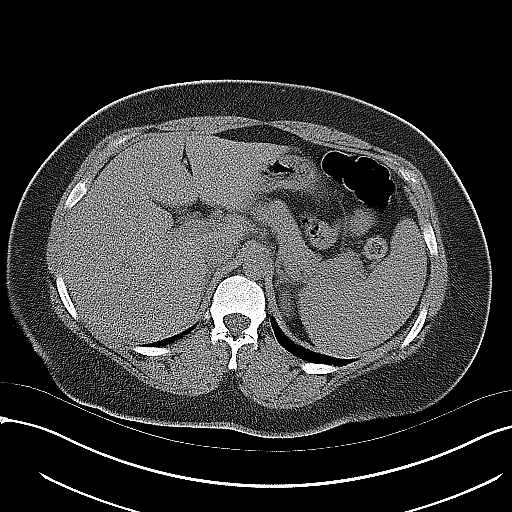
[im 7/25  soft-tissue]
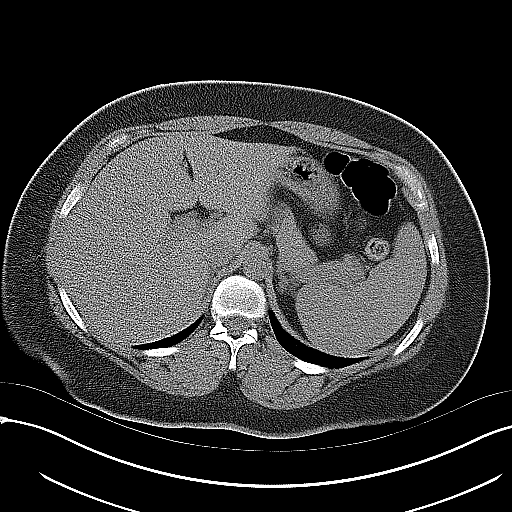
[im 9/25  soft-tissue]
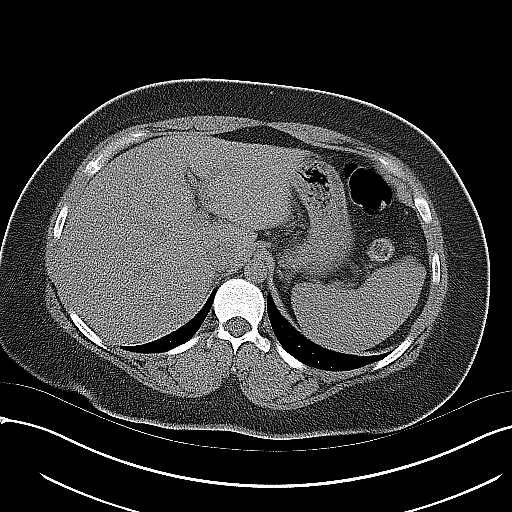
[im 11/25  soft-tissue]
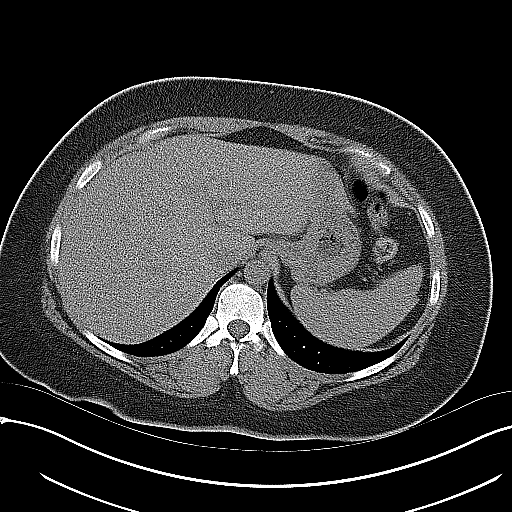
[im 12/25  soft-tissue]
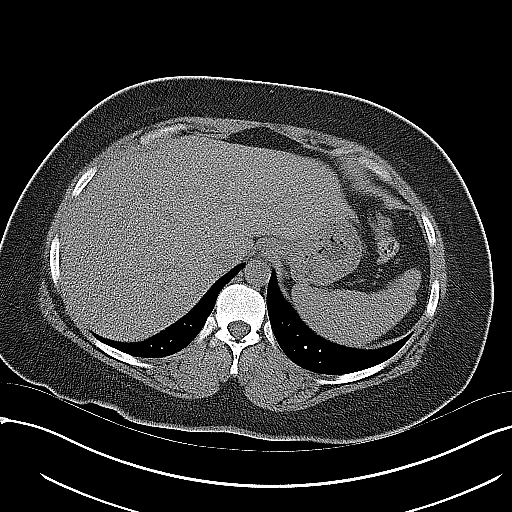
[im 14/25  soft-tissue]
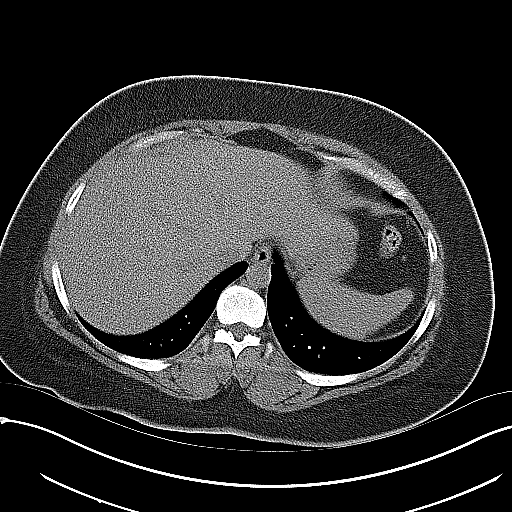
[im 15/25  soft-tissue]
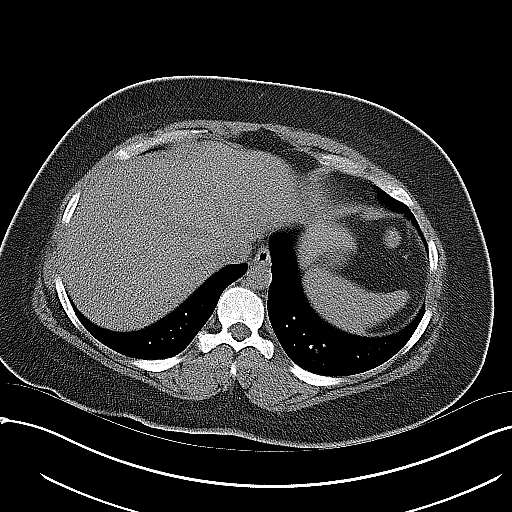
[im 15/25  bone]
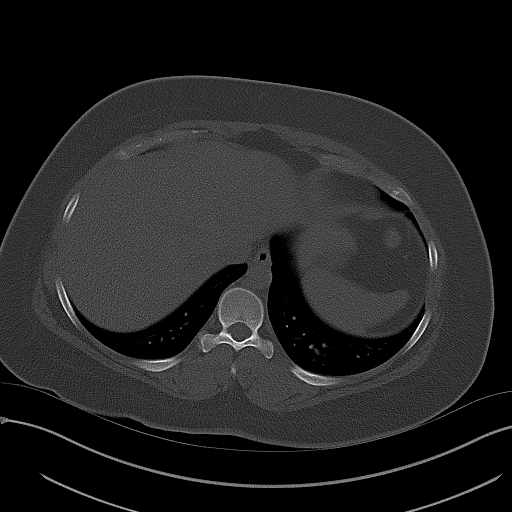
[im 17/25  soft-tissue]
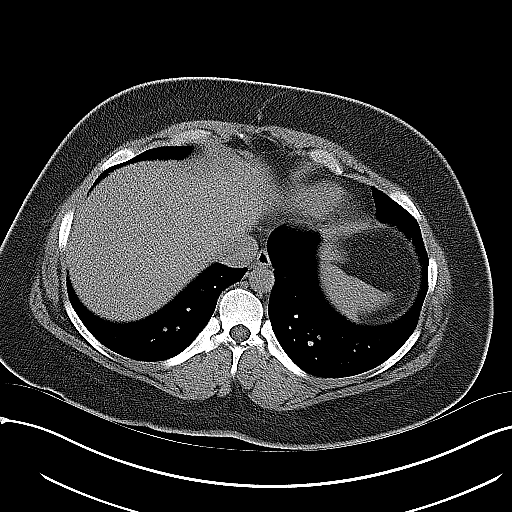
[im 19/25  soft-tissue]
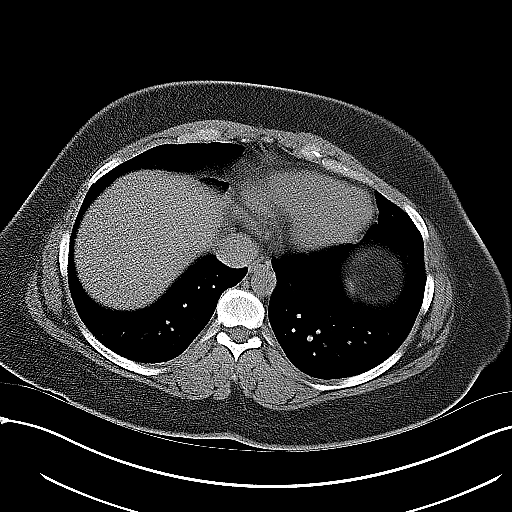
[im 20/25  soft-tissue]
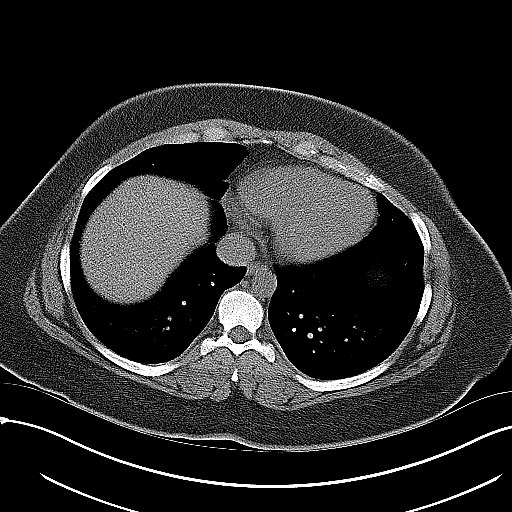
[im 22/25  soft-tissue]
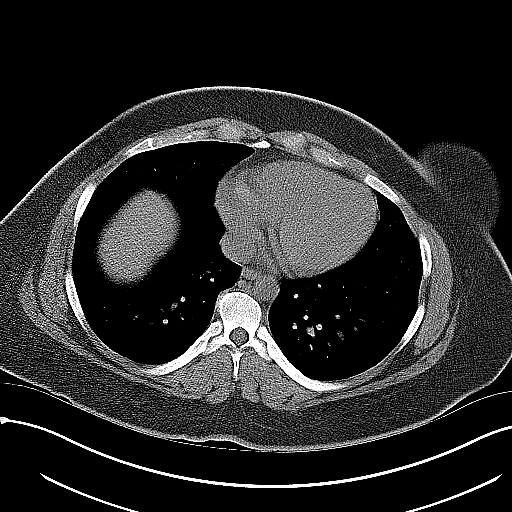
[im 24/25  soft-tissue]
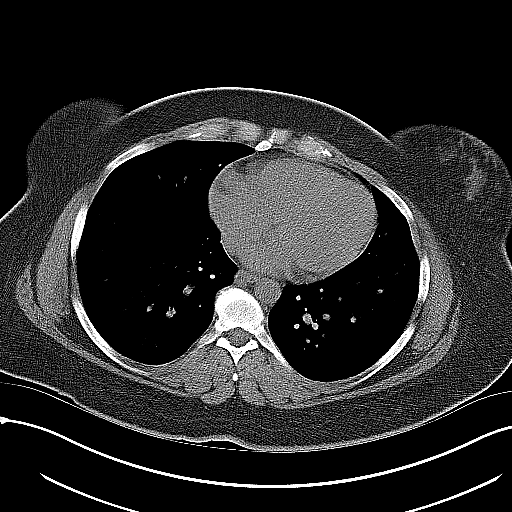

[17 of 39 positions shown; findings below may reference images not displayed]

FINDINGS: Lower chest:  No acute findings.

Hepatobiliary: Normal appearing liver and gallbladder.

Pancreas: No mass or inflammatory process identified on this
un-enhanced exam.

Spleen: Within normal limits in size.

Adrenals/Urinary Tract: No evidence of urolithiasis or
hydronephrosis. No definite mass visualized on this un-enhanced
exam.

Stomach/Bowel: No gastrointestinal abnormalities. Normal appearing
appendix containing some calcific density.

Vascular/Lymphatic: No pathologically enlarged lymph nodes. No
evidence of abdominal aortic aneurysm.

Reproductive: Intrauterine device in the uterus. Normal appearing
ovaries.

Other: None.

Musculoskeletal:  No suspicious bone lesions identified.
IMPRESSION: Normal examination.

## 2017-03-08 ENCOUNTER — Telehealth: Payer: Self-pay | Admitting: Obstetrics and Gynecology

## 2017-03-08 NOTE — Telephone Encounter (Signed)
Left message to call Kaitlyn at [phone]. 

## 2017-03-08 NOTE — Telephone Encounter (Signed)
Patient wants to schedule IUD removal and reinsertion.

## 2017-03-21 NOTE — Telephone Encounter (Signed)
Left message to call Ryott Rafferty at [phone]. 

## 2017-03-24 NOTE — Telephone Encounter (Signed)
Patient has not returned call to the office x 2. Encounter closed.

## 2017-04-03 ENCOUNTER — Telehealth: Payer: Self-pay | Admitting: Obstetrics and Gynecology

## 2017-04-03 NOTE — Telephone Encounter (Signed)
Patient calling to schedule iud removal and insertion. °

## 2017-04-03 NOTE — Telephone Encounter (Signed)
Spoke with patient. Patient states that she is due to have her Mirena IUD removed and replaced.Thinks her IUD expired on 03/25/2017. States she has had intercourse since expiration without a condom. IUD removal and reinsertion scheduled for 04/04/2017 at 4 pm with Dr.Jertson. Patient is agreeable to date and time. Pre procedure instructions given.  Motrin instructions given. Motrin=Advil=Ibuprofen, 800 mg one hour before appointment. Eat a meal and hydrate well before appointment.  Advised will review date with Dr.Jertson and return call if this needs to be rescheduled due to intercourse. Patient is agreeable.

## 2017-04-04 ENCOUNTER — Ambulatory Visit (INDEPENDENT_AMBULATORY_CARE_PROVIDER_SITE_OTHER): Payer: BLUE CROSS/BLUE SHIELD | Admitting: Obstetrics and Gynecology

## 2017-04-04 ENCOUNTER — Encounter: Payer: Self-pay | Admitting: Obstetrics and Gynecology

## 2017-04-04 VITALS — BP 102/70 | HR 64 | Resp 16 | Wt 259.0 lb

## 2017-04-04 DIAGNOSIS — Z30433 Encounter for removal and reinsertion of intrauterine contraceptive device: Secondary | ICD-10-CM | POA: Diagnosis not present

## 2017-04-04 DIAGNOSIS — Z01812 Encounter for preprocedural laboratory examination: Secondary | ICD-10-CM | POA: Diagnosis not present

## 2017-04-04 DIAGNOSIS — N943 Premenstrual tension syndrome: Secondary | ICD-10-CM | POA: Diagnosis not present

## 2017-04-04 LAB — POCT URINE PREGNANCY: PREG TEST UR: NEGATIVE

## 2017-04-04 NOTE — Progress Notes (Signed)
GYNECOLOGY  VISIT   HPI: 26 y.o.   Legally Separated  Caucasian  female   G1P1001 with Patient's last menstrual period was 04/03/2017.   here for IUD removal and reinsertion  Negative genprobe 7 months ago. Same partner, no worries about STD's. She tried celexa for a few days earlier this year for PMS, didn't like the celexa. Not having PMS every month  GYNECOLOGIC HISTORY: Patient's last menstrual period was 04/03/2017. Contraception:IUD (Mirena) Menopausal hormone therapy: none         OB History    Gravida Para Term Preterm AB Living   1 1 1     1    SAB TAB Ectopic Multiple Live Births           1         Patient Active Problem List   Diagnosis Date Noted  . Migraine without aura   . Arthritis   . Anxiety   . Allergy   . Increased bowel frequency 07/12/2016  . Increased urinary frequency 07/12/2016  . Obesity 09/26/2014  . Rhinitis, allergic 09/09/2014    Past Medical History:  Diagnosis Date  . Allergy   . Anxiety   . Arthritis   . Migraine without aura     Past Surgical History:  Procedure Laterality Date  . KNEE ARTHROSCOPY Right     Current Outpatient Prescriptions  Medication Sig Dispense Refill  . albuterol (PROVENTIL HFA;VENTOLIN HFA) 108 (90 BASE) MCG/ACT inhaler Inhale 2 puffs into the lungs every 4 (four) hours as needed for wheezing or shortness of breath (cough, shortness of breath or wheezing.). 1 Inhaler 1  . levonorgestrel (MIRENA) 20 MCG/24HR IUD 1 each by Intrauterine route once.     No current facility-administered medications for this visit.      ALLERGIES: Patient has no known allergies.  Family History  Problem Relation Age of Onset  . Diabetes Father   . Arthritis Father   . Depression Father   . Mental illness Father        PTSD, anxiety  . Arthritis Mother        osteoartthritis  . Kidney disease Maternal Grandmother   . Arthritis Maternal Grandmother   . Prostate cancer Maternal Grandfather   . Arthritis Maternal  Grandfather   . Arthritis Paternal Grandmother   . Diabetes Paternal Grandfather   . Mental illness Paternal Grandfather   . Arthritis Paternal Grandfather     Social History   Social History  . Marital status: Legally Separated    Spouse name: N/A  . Number of children: N/A  . Years of education: N/A   Occupational History  . Not on file.   Social History Main Topics  . Smoking status: Never Smoker  . Smokeless tobacco: Never Used  . Alcohol use No  . Drug use: No  . Sexual activity: Yes    Partners: Male    Birth control/ protection: IUD   Other Topics Concern  . Not on file   Social History Narrative  . No narrative on file    Review of Systems  Constitutional: Negative.   HENT: Negative.   Eyes: Negative.   Respiratory: Negative.   Cardiovascular: Negative.   Gastrointestinal: Positive for constipation.       Bloating  Genitourinary:       Breast pain   Musculoskeletal: Negative.   Skin: Negative.   Neurological: Positive for headaches.  Endo/Heme/Allergies: Negative.   Psychiatric/Behavioral: Positive for depression.    PHYSICAL EXAMINATION:  BP 102/70 (BP Location: Right Arm, Patient Position: Sitting, Cuff Size: Normal)   Pulse 64   Resp 16   Wt 259 lb (117.5 kg)   LMP 04/03/2017   BMI 41.49 kg/m     General appearance: alert, cooperative and appears stated age  Pelvic: External genitalia:  no lesions              Urethra:  normal appearing urethra with no masses, tenderness or lesions              Bartholins and Skenes: normal                 Vagina: normal appearing vagina with normal color and discharge, no lesions              Cervix: no lesions  The risks of the mirena IUD were reviewed with the patient, including infection, abnormal bleeding and uterine perfortion. Consent was signed.  A speculum was placed in the vagina, the mirena IUD was removed with a ring forceps. The cervix was cleansed with betadine. A tenaculum was placed on  the cervix, the uterus sounded to 8 cm. The cervix was dilated to a #5 hagar dilator  The new mirena IUD was inserted without difficulty. The string were cut to 3-4 cm. The tenaculum was removed. Slight oozing from the tenaculum site was stopped with pressure.   The patient tolerated the procedure well.   Chaperone was present for exam.  ASSESSMENT Mirena iud removal and reinsertion PMS, varies month to month. Tried the Celexa for just a few days (didn't like how she felt)    PLAN F/U in one month She will see how she feels over the next few months, could consider another SSRI for her PMS if desired (she has declined OCP's)   An After Visit Summary was printed and given to the patient.

## 2017-04-04 NOTE — Telephone Encounter (Signed)
Patient needs to schedule a 1 mo reck appointment.

## 2017-04-05 ENCOUNTER — Telehealth: Payer: Self-pay | Admitting: Obstetrics and Gynecology

## 2017-04-05 NOTE — Telephone Encounter (Signed)
Called and left a message for patient to call back to schedule.

## 2017-04-05 NOTE — Telephone Encounter (Signed)
Patient needs to schedule 1 mo reck with Dr. Oscar LaJertson.

## 2017-04-06 HISTORY — PX: INTRAUTERINE DEVICE (IUD) INSERTION: SHX5877

## 2017-04-07 NOTE — Telephone Encounter (Signed)
Called patient and spoke with her to schedule recheck on 05/08/17 with Dr. Oscar LaJertson.

## 2017-04-24 ENCOUNTER — Telehealth: Payer: Self-pay | Admitting: Obstetrics and Gynecology

## 2017-04-24 NOTE — Telephone Encounter (Signed)
Left message regarding upcoming appointment 05/08/17 Mirena recheck has been canceled and needs to be rescheduled.

## 2017-05-08 ENCOUNTER — Ambulatory Visit: Payer: BLUE CROSS/BLUE SHIELD | Admitting: Obstetrics and Gynecology

## 2017-05-10 ENCOUNTER — Encounter: Payer: Self-pay | Admitting: Obstetrics and Gynecology

## 2017-05-10 ENCOUNTER — Other Ambulatory Visit: Payer: Self-pay

## 2017-05-10 ENCOUNTER — Ambulatory Visit: Payer: BLUE CROSS/BLUE SHIELD | Admitting: Obstetrics and Gynecology

## 2017-05-10 VITALS — BP 134/80 | HR 76 | Resp 16 | Wt 255.0 lb

## 2017-05-10 DIAGNOSIS — R109 Unspecified abdominal pain: Secondary | ICD-10-CM

## 2017-05-10 DIAGNOSIS — R350 Frequency of micturition: Secondary | ICD-10-CM

## 2017-05-10 DIAGNOSIS — Z30431 Encounter for routine checking of intrauterine contraceptive device: Secondary | ICD-10-CM | POA: Diagnosis not present

## 2017-05-10 DIAGNOSIS — R3989 Other symptoms and signs involving the genitourinary system: Secondary | ICD-10-CM

## 2017-05-10 DIAGNOSIS — R102 Pelvic and perineal pain: Secondary | ICD-10-CM | POA: Diagnosis not present

## 2017-05-10 LAB — POCT URINALYSIS DIPSTICK
Bilirubin, UA: NEGATIVE
Glucose, UA: NEGATIVE
KETONES UA: NEGATIVE
Leukocytes, UA: NEGATIVE
Nitrite, UA: NEGATIVE
PH UA: 7.5 (ref 5.0–8.0)
PROTEIN UA: NEGATIVE
RBC UA: NEGATIVE
Urobilinogen, UA: NEGATIVE E.U./dL — AB

## 2017-05-10 LAB — POCT URINE PREGNANCY: PREG TEST UR: NEGATIVE

## 2017-05-10 NOTE — Progress Notes (Signed)
GYNECOLOGY  VISIT   HPI: 26 y.o.   Legally Separated  Caucasian  female   G1P1001 with Patient's last menstrual period was 04/15/2017.   here for IUD check, 5 weeks ago she had removal of a mirena and reinsertion of mirena IUD.  She has been getting regular menses with the mirena. She felt slightly sensitive for the first few weeks, improved. No dyspareunia.  She c/o a 2 week h/o soreness in her lower abdomen, possibly her bladder, comes and goes. Intermittent urinary frequency, normal amounts. No urinary urgency. No dysuria. She can have pain prior to voiding, improves with voiding. No fevers. C/O intermittent diarrhea the last few days. Some bloating.   GYNECOLOGIC HISTORY: Patient's last menstrual period was 04/15/2017. Contraception:IUD  Menopausal hormone therapy: none         OB History    Gravida Para Term Preterm AB Living   1 1 1     1    SAB TAB Ectopic Multiple Live Births           1         Patient Active Problem List   Diagnosis Date Noted  . Migraine without aura   . Arthritis   . Anxiety   . Allergy   . Increased bowel frequency 07/12/2016  . Increased urinary frequency 07/12/2016  . Obesity 09/26/2014  . Rhinitis, allergic 09/09/2014    Past Medical History:  Diagnosis Date  . Allergy   . Anxiety   . Arthritis   . Migraine without aura     Past Surgical History:  Procedure Laterality Date  . INTRAUTERINE DEVICE (IUD) INSERTION  04/2017   Mirena   . KNEE ARTHROSCOPY Right     Current Outpatient Medications  Medication Sig Dispense Refill  . albuterol (PROVENTIL HFA;VENTOLIN HFA) 108 (90 BASE) MCG/ACT inhaler Inhale 2 puffs into the lungs every 4 (four) hours as needed for wheezing or shortness of breath (cough, shortness of breath or wheezing.). 1 Inhaler 1  . levonorgestrel (MIRENA) 20 MCG/24HR IUD 1 each by Intrauterine route once.     No current facility-administered medications for this visit.      ALLERGIES: Patient has no known  allergies.  Family History  Problem Relation Age of Onset  . Diabetes Father   . Arthritis Father   . Depression Father   . Mental illness Father        PTSD, anxiety  . Arthritis Mother        osteoartthritis  . Kidney disease Maternal Grandmother   . Arthritis Maternal Grandmother   . Prostate cancer Maternal Grandfather   . Arthritis Maternal Grandfather   . Arthritis Paternal Grandmother   . Diabetes Paternal Grandfather   . Mental illness Paternal Grandfather   . Arthritis Paternal Grandfather     Social History   Socioeconomic History  . Marital status: Legally Separated    Spouse name: Not on file  . Number of children: Not on file  . Years of education: Not on file  . Highest education level: Not on file  Social Needs  . Financial resource strain: Not on file  . Food insecurity - worry: Not on file  . Food insecurity - inability: Not on file  . Transportation needs - medical: Not on file  . Transportation needs - non-medical: Not on file  Occupational History  . Not on file  Tobacco Use  . Smoking status: Never Smoker  . Smokeless tobacco: Never Used  Substance and Sexual Activity  .  Alcohol use: No    Alcohol/week: 0.0 oz  . Drug use: No  . Sexual activity: Yes    Partners: Male    Birth control/protection: IUD  Other Topics Concern  . Not on file  Social History Narrative  . Not on file    Review of Systems  Constitutional: Negative.   HENT: Negative.   Eyes: Negative.   Respiratory: Negative.   Cardiovascular: Negative.   Gastrointestinal: Negative.   Genitourinary: Negative.   Musculoskeletal: Negative.   Skin: Negative.   Neurological: Negative.   Endo/Heme/Allergies: Negative.   Psychiatric/Behavioral: Negative.     PHYSICAL EXAMINATION:    BP 134/80 (BP Location: Right Arm, Patient Position: Sitting, Cuff Size: Normal)   Pulse 76   Resp 16   Wt 255 lb (115.7 kg)   LMP 04/15/2017   BMI 40.85 kg/m     General appearance: alert,  cooperative and appears stated age Abdomen: soft, tender in the peri-umbilical region, BLQ and SP region (most tender in the peri-umbilical region); non distended, no masses,  no organomegaly  Pelvic: External genitalia:  no lesions              Urethra:  normal appearing urethra with no masses, tenderness or lesions              Bartholins and Skenes: normal                 Vagina: normal appearing vagina with normal color and discharge, no lesions              Cervix: no cervical motion tenderness, no lesions and IUD string 2 cm              Bimanual Exam:  Uterus:  normal size, contour, position, consistency, mobility, non-tender and anteverted              Adnexa: no masses, tender on the left              Bladder: very tender  Chaperone was present for exam.  ASSESSMENT IUD check, overall doing well BLQ abdominal pain, no CMT, no uterine tenderness Bladder pain, some frequency of urination Some intermittent diarrhea    PLAN Genprobe Urine for UPT, negative Send urine for ua, c&s If testing is negative, recommend she f/u with her primary MD If pain persists would set up an ultrasound   An After Visit Summary was printed and given to the patient.

## 2017-05-10 NOTE — Patient Instructions (Signed)
If pain persists, call to set up an ultrasound F/U with primary for diarrhea and mid abdominal pain

## 2017-05-11 LAB — URINE CULTURE

## 2017-05-11 LAB — URINALYSIS, MICROSCOPIC ONLY
CASTS: NONE SEEN /LPF
EPITHELIAL CELLS (NON RENAL): NONE SEEN /HPF (ref 0–10)
RBC, UA: NONE SEEN /hpf (ref 0–?)

## 2017-05-12 ENCOUNTER — Encounter: Payer: Self-pay | Admitting: Nurse Practitioner

## 2017-05-12 ENCOUNTER — Ambulatory Visit (HOSPITAL_COMMUNITY)
Admission: RE | Admit: 2017-05-12 | Discharge: 2017-05-12 | Disposition: A | Discharge status: Home / Self Care | Payer: BLUE CROSS/BLUE SHIELD | Source: Ambulatory Visit | Attending: Nurse Practitioner | Admitting: Nurse Practitioner

## 2017-05-12 ENCOUNTER — Ambulatory Visit: Payer: BLUE CROSS/BLUE SHIELD | Admitting: Nurse Practitioner

## 2017-05-12 VITALS — BP 126/74 | HR 93 | Temp 98.3°F | Ht 66.25 in | Wt 239.0 lb

## 2017-05-12 DIAGNOSIS — R1030 Lower abdominal pain, unspecified: Secondary | ICD-10-CM

## 2017-05-12 DIAGNOSIS — R6883 Chills (without fever): Secondary | ICD-10-CM

## 2017-05-12 DIAGNOSIS — R110 Nausea: Secondary | ICD-10-CM | POA: Insufficient documentation

## 2017-05-12 DIAGNOSIS — R197 Diarrhea, unspecified: Secondary | ICD-10-CM

## 2017-05-12 LAB — COMPREHENSIVE METABOLIC PANEL
ALBUMIN: 4.1 g/dL (ref 3.5–5.2)
ALK PHOS: 57 U/L (ref 39–117)
ALT: 12 U/L (ref 0–35)
AST: 13 U/L (ref 0–37)
BILIRUBIN TOTAL: 0.3 mg/dL (ref 0.2–1.2)
BUN: 10 mg/dL (ref 6–23)
CALCIUM: 9.1 mg/dL (ref 8.4–10.5)
CO2: 28 mEq/L (ref 19–32)
Chloride: 107 mEq/L (ref 96–112)
Creatinine, Ser: 0.72 mg/dL (ref 0.40–1.20)
GFR: 102.29 mL/min (ref >60.00)
Glucose, Bld: 94 mg/dL (ref 70–99)
Potassium: 4.9 mEq/L (ref 3.5–5.1)
Sodium: 142 mEq/L (ref 135–145)
TOTAL PROTEIN: 6.4 g/dL (ref 6.0–8.3)

## 2017-05-12 LAB — GC/CHLAMYDIA PROBE AMP
Chlamydia trachomatis, NAA: NEGATIVE
Neisseria gonorrhoeae by PCR: NEGATIVE

## 2017-05-12 LAB — CBC WITH DIFFERENTIAL/PLATELET
BASOS ABS: 0 10*3/uL (ref 0.0–0.1)
Basophils Relative: 0.6 % (ref 0.0–3.0)
EOS PCT: 1.3 % (ref 0.0–5.0)
Eosinophils Absolute: 0.1 10*3/uL (ref 0.0–0.7)
HEMATOCRIT: 41.9 % (ref 36.0–46.0)
Hemoglobin: 13.9 g/dL (ref 12.0–15.0)
LYMPHS PCT: 29.2 % (ref 12.0–46.0)
Lymphs Abs: 1.6 10*3/uL (ref 0.7–4.0)
MCHC: 33.3 g/dL (ref 30.0–36.0)
MCV: 91.3 fl (ref 78.0–100.0)
MONOS PCT: 9.4 % (ref 3.0–12.0)
Monocytes Absolute: 0.5 10*3/uL (ref 0.1–1.0)
NEUTROS ABS: 3.3 10*3/uL (ref 1.4–7.7)
Neutrophils Relative %: 59.5 % (ref 43.0–77.0)
Platelets: 217 10*3/uL (ref 150.0–400.0)
RBC: 4.59 Mil/uL (ref 3.87–5.11)
RDW: 12.8 % (ref 11.5–15.5)
WBC: 5.5 10*3/uL (ref 4.0–10.5)

## 2017-05-12 MED ORDER — IOPAMIDOL (ISOVUE-300) INJECTION 61%
100.0000 mL | Freq: Once | INTRAVENOUS | Status: AC | PRN
  Administered 2017-05-12: 100 mL via INTRAVENOUS

## 2017-05-12 MED ORDER — PROMETHAZINE HCL 12.5 MG PO TABS: 12.5000 mg | ORAL_TABLET | Freq: Three times a day (TID) | ORAL | 0 refills | Status: DC | PRN

## 2017-05-12 MED ORDER — IOPAMIDOL (ISOVUE-300) INJECTION 61%
INTRAVENOUS | Status: AC
  Filled 2017-05-12: qty 100

## 2017-05-12 MED ORDER — BISMUTH SUBSALICYLATE 262 MG/15ML PO SUSP
30.0000 mL | Freq: Four times a day (QID) | ORAL | 0 refills | Status: DC | PRN
Start: 2017-05-12 — End: 2017-08-17

## 2017-05-12 NOTE — Patient Instructions (Signed)
Patient notified about CT results via mobile telephone on file. Promethazine for nausea have been sent.  May also use peptobismol OTC for diarrhea. Encourage adequate oral hydration. Advised to maintain clear liquid diet for 24hrs, then advance to Brat diet x 48hrs then advance as tolerated. She is to return to lab on Monday for stool collection kit.

## 2017-05-12 NOTE — Progress Notes (Signed)
Subjective:  Patient ID: Carmen Wall [place], female    DOB: 1990/09/10  Age: 26 y.o. MRN: [id number]  CC: Pain (sharp pain on lower back, tender on lower abd,SOB,light headed when chnge position,nausea---saw GYN yesterday not pregnent or uti. 4 days. )   Abdominal Pain  This is a new problem. The current episode started 1 to 4 weeks ago. The onset quality is gradual. The problem occurs constantly. The problem has been gradually worsening. The pain is located in the suprapubic region. The quality of the pain is a sensation of fullness and cramping. The abdominal pain radiates to the left flank, right flank and back. Associated symptoms include anorexia, diarrhea and nausea. Pertinent negatives include no constipation, dysuria, fever, frequency, hematochezia, hematuria, melena or vomiting. Associated symptoms comments: And chills. The pain is relieved by nothing. She has tried acetaminophen for the symptoms. The treatment provided no relief. There is no history of abdominal surgery or GERD.  urinalysis, urine culture, urine pregnancy, urine GC/Gonorrhea all negative 05/10/2017 (done by Dr. Oscar LaJertson- GYN).  Diffuse ABD pain: 2weeks. Nausea, no emesis Diarrhea x 1week, liquid to soft. 1-4times a day. No fever, has chills. No travel. No FHx of IBS. No hematochezia, no melena.  Outpatient Medications Prior to Visit  Medication Sig Dispense Refill  . albuterol (PROVENTIL HFA;VENTOLIN HFA) 108 (90 BASE) MCG/ACT inhaler Inhale 2 puffs into the lungs every 4 (four) hours as needed for wheezing or shortness of breath (cough, shortness of breath or wheezing.). 1 Inhaler 1  . levonorgestrel (MIRENA) 20 MCG/24HR IUD 1 each by Intrauterine route once.     No facility-administered medications prior to visit.     ROS See HPI  Objective:  BP 126/74   Pulse 93   Temp 98.3 F (36.8 C)   Ht 5' 6.25" (1.683 Wall)   Wt 239 lb (108.4 kg)   LMP 04/15/2017   SpO2 98%   BMI 38.29 kg/Wall   BP Readings  from Last 3 Encounters:  05/12/17 126/74  05/10/17 134/80  04/04/17 102/70    Wt Readings from Last 3 Encounters:  05/12/17 239 lb (108.4 kg)  05/10/17 255 lb (115.7 kg)  04/04/17 259 lb (117.5 kg)    Physical Exam  Constitutional: She is oriented to person, place, and time. No distress.  Cardiovascular: Normal rate.  Pulmonary/Chest: Effort normal.  Abdominal: She exhibits no distension. There is tenderness. There is guarding. There is no rebound.  Neurological: She is alert and oriented to person, place, and time.  Skin: Skin is warm and dry. No rash noted. No erythema.  Vitals reviewed.   Lab Results  Component Value Date   WBC 5.5 05/12/2017   HGB 13.9 05/12/2017   HCT 41.9 05/12/2017   PLT 217.0 05/12/2017   GLUCOSE 94 05/12/2017   CHOL 214 (H) 07/12/2016   TRIG 123.0 07/12/2016   HDL 50.20 07/12/2016   LDLCALC 140 (H) 07/12/2016   ALT 12 05/12/2017   AST 13 05/12/2017   NA 142 05/12/2017   K 4.9 05/12/2017   CL 107 05/12/2017   CREATININE 0.72 05/12/2017   BUN 10 05/12/2017   CO2 28 05/12/2017   TSH 0.82 07/12/2016    Mr Knee Right W/o Contrast  Result Date: 10/12/2016 CLINICAL DATA:  Right knee. EXAM: MRI OF THE RIGHT KNEE WITHOUT CONTRAST TECHNIQUE: Multiplanar, multisequence MR imaging of the knee was performed. No intravenous contrast was administered. COMPARISON:  None. FINDINGS: MENISCI Medial meniscus:  Intact. Lateral meniscus: Horizontal tear of the  body of the lateral meniscus extending to the free edge. LIGAMENTS Cruciates:  Intact ACL and PCL. Collaterals: Medial collateral ligament is intact. Lateral collateral ligament complex is intact. CARTILAGE Patellofemoral: Severe cartilage fissuring involving the lateral patellar facet. Medial:  No chondral defect. Lateral: Mild partial-thickness cartilage loss of the lateral femoral condyle and lateral tibial plateau. Joint: No joint effusion. Edema in superolateral Hoffa's fat. No plical thickening. Popliteal  Fossa:  Tiny Baker cyst.  Intact popliteus tendon. Extensor Mechanism: Intact quadriceps tendon and patellar tendon. There is evidence of prior lateral retinacular release. Bones: Lateral patellar tilting. No acute osseous abnormality. No other marrow signal abnormality. Other: No fluid collection or hematoma. IMPRESSION: 1. Horizontal tear of the body of the lateral meniscus extending to the free edge. 2. Severe cartilage fissuring involving the lateral patellar facet. 3. Edema in superolateral Hoffa's fat as can be seen with patellar tendon-lateral femoral condyle friction syndrome. Electronically Signed   By: Elige Ko   On: 10/12/2016 15:49    Assessment & Plan:   Yukari was seen today for pain.  Diagnoses and all orders for this visit:  Lower abdominal pain -     CBC w/Diff -     Comprehensive metabolic panel -     Cancel: CT Abdomen Pelvis W Contrast; Future -     CT Abdomen Pelvis W Contrast; Future -     promethazine (PHENERGAN) 12.5 MG tablet; Take 1 tablet (12.5 mg total) by mouth every 8 (eight) hours as needed for nausea or vomiting. -     bismuth subsalicylate (PEPTO-BISMOL) 262 MG/15ML suspension; Take 30 mLs by mouth every 6 (six) hours as needed for diarrhea or loose stools. -     Gastrointestinal Pathogen Panel PCR; Future -     Stool, WBC/Lactoferrin; Future  Chills (without fever) -     CBC w/Diff -     Comprehensive metabolic panel -     Cancel: CT Abdomen Pelvis W Contrast; Future -     CT Abdomen Pelvis W Contrast; Future -     promethazine (PHENERGAN) 12.5 MG tablet; Take 1 tablet (12.5 mg total) by mouth every 8 (eight) hours as needed for nausea or vomiting. -     bismuth subsalicylate (PEPTO-BISMOL) 262 MG/15ML suspension; Take 30 mLs by mouth every 6 (six) hours as needed for diarrhea or loose stools. -     Gastrointestinal Pathogen Panel PCR; Future -     Stool, WBC/Lactoferrin; Future  Diarrhea, unspecified type -     CBC w/Diff -     Comprehensive  metabolic panel -     Cancel: CT Abdomen Pelvis W Contrast; Future -     CT Abdomen Pelvis W Contrast; Future -     promethazine (PHENERGAN) 12.5 MG tablet; Take 1 tablet (12.5 mg total) by mouth every 8 (eight) hours as needed for nausea or vomiting. -     bismuth subsalicylate (PEPTO-BISMOL) 262 MG/15ML suspension; Take 30 mLs by mouth every 6 (six) hours as needed for diarrhea or loose stools. -     Gastrointestinal Pathogen Panel PCR; Future -     Stool, WBC/Lactoferrin; Future   I am having Samaira Wall. [place] start on promethazine and bismuth subsalicylate. I am also having her maintain her albuterol and levonorgestrel.  Meds ordered this encounter  Medications  . promethazine (PHENERGAN) 12.5 MG tablet    Sig: Take 1 tablet (12.5 mg total) by mouth every 8 (eight) hours as needed for nausea or vomiting.  Dispense:  20 tablet    Refill:  0    Order Specific Question:   Supervising Provider    Answer:   Dianne DunARON, TALIA Wall [[id number]]  . bismuth subsalicylate (PEPTO-BISMOL) 262 MG/15ML suspension    Sig: Take 30 mLs by mouth every 6 (six) hours as needed for diarrhea or loose stools.    Dispense:  360 mL    Refill:  0    Order Specific Question:   Supervising Provider    Answer:   Dianne DunARON, TALIA Wall [[id number]]    Follow-up: No Follow-up on file.  Alysia Pennaharlotte Rise Traeger, NP

## 2017-07-19 ENCOUNTER — Ambulatory Visit: Payer: BLUE CROSS/BLUE SHIELD | Admitting: Nurse Practitioner

## 2017-07-19 ENCOUNTER — Encounter: Payer: Self-pay | Admitting: Nurse Practitioner

## 2017-07-19 VITALS — BP 110/78 | HR 77 | Temp 98.2°F | Ht 66.25 in | Wt 261.0 lb

## 2017-07-19 DIAGNOSIS — L309 Dermatitis, unspecified: Secondary | ICD-10-CM

## 2017-07-19 MED ORDER — TRIAMCINOLONE ACETONIDE 0.5 % EX OINT: 1.0000 "application " | TOPICAL_OINTMENT | Freq: Two times a day (BID) | CUTANEOUS | 0 refills | Status: DC

## 2017-07-19 NOTE — Progress Notes (Signed)
Subjective:  Patient ID: Carmen Wall, female    DOB: [birth year]  Age: 27 y.o. MRN: [id number]  CC: Rash (rash on arms,itching and burning/4 days/tired OTC cream/)   Rash  This is a new problem. The current episode started in the past 7 days. The problem is unchanged. The affected locations include the left arm and right arm. The rash is characterized by burning, itchiness and redness. It is unknown if there was an exposure to a precipitant. Pertinent negatives include no anorexia, congestion, cough, eye pain, facial edema, fatigue, fever, joint pain, nail changes, rhinorrhea, shortness of breath or sore throat. Past treatments include anti-itch cream. The treatment provided no relief. Her past medical history is significant for allergies. There is no history of eczema or varicella.    Outpatient Medications Prior to Visit  Medication Sig Dispense Refill  . albuterol (PROVENTIL HFA;VENTOLIN HFA) 108 (90 BASE) MCG/ACT inhaler Inhale 2 puffs into the lungs every 4 (four) hours as needed for wheezing or shortness of breath (cough, shortness of breath or wheezing.). 1 Inhaler 1  . bismuth subsalicylate (PEPTO-BISMOL) 262 MG/15ML suspension Take 30 mLs by mouth every 6 (six) hours as needed for diarrhea or loose stools. 360 mL 0  . levonorgestrel (MIRENA) 20 MCG/24HR IUD 1 each by Intrauterine route once.    . promethazine (PHENERGAN) 12.5 MG tablet Take 1 tablet (12.5 mg total) by mouth every 8 (eight) hours as needed for nausea or vomiting. (Patient not taking: Reported on 07/19/2017) 20 tablet 0   No facility-administered medications prior to visit.     ROS See HPI  Objective:  BP 110/78   Pulse 77   Temp 98.2 F (36.8 C)   Ht 5' 6.25" (1.683 m)   Wt 261 lb (118.4 kg)   SpO2 99%   BMI 41.81 kg/m   BP Readings from Last 3 Encounters:  07/19/17 110/78  05/12/17 126/74  05/10/17 134/80    Wt Readings from Last 3 Encounters:  07/19/17 261 lb (118.4 kg)  05/12/17 239 lb  (108.4 kg)  05/10/17 255 lb (115.7 kg)    Physical Exam  Constitutional: She is oriented to person, place, and time.  Neurological: She is alert and oriented to person, place, and time.  Skin: Skin is warm and dry. Rash noted. Rash is maculopapular.     Psychiatric: She has a normal mood and affect. Her behavior is normal.    Lab Results  Component Value Date   WBC 5.5 05/12/2017   HGB 13.9 05/12/2017   HCT 41.9 05/12/2017   PLT 217.0 05/12/2017   GLUCOSE 94 05/12/2017   CHOL 214 (H) 07/12/2016   TRIG 123.0 07/12/2016   HDL 50.20 07/12/2016   LDLCALC 140 (H) 07/12/2016   ALT 12 05/12/2017   AST 13 05/12/2017   NA 142 05/12/2017   K 4.9 05/12/2017   CL 107 05/12/2017   CREATININE 0.72 05/12/2017   BUN 10 05/12/2017   CO2 28 05/12/2017   TSH 0.82 07/12/2016    Ct Abdomen Pelvis W Contrast  Result Date: 05/12/2017 CLINICAL DATA:  Lower abdominal pain radiates to low back. Associated with nausea diarrhea and chills. EXAM: CT ABDOMEN AND PELVIS WITH CONTRAST TECHNIQUE: Multidetector CT imaging of the abdomen and pelvis was performed using the standard protocol following bolus administration of intravenous contrast. CONTRAST:  ISOVUE-300 IOPAMIDOL (ISOVUE-300) INJECTION 61% COMPARISON:  07/25/2015 FINDINGS: Lower chest:  Unremarkable. Hepatobiliary: No focal abnormality within the liver parenchyma. There is no evidence for  gallstones, gallbladder wall thickening, or pericholecystic fluid. No intrahepatic or extrahepatic biliary dilation. Pancreas: No focal mass lesion. No dilatation of the main duct. No intraparenchymal cyst. No peripancreatic edema. Spleen: 10 mm hypoattenuating subcapsular lesion in the posterior spleen and is likely a small cyst or pseudocyst. Adrenals/Urinary Tract: No adrenal nodule or mass. Kidneys are unremarkable. No evidence for hydroureter. The urinary bladder appears normal for the degree of distention. Stomach/Bowel: Stomach is nondistended. No  gastric wall thickening. No evidence of outlet obstruction. Duodenum is normally positioned as is the ligament of Treitz. No small bowel wall thickening. No small bowel dilatation. The terminal ileum is normal. No gross colonic mass. No colonic wall thickening. No substantial diverticular change. Vascular/Lymphatic: There is no gastrohepatic or hepatoduodenal ligament lymphadenopathy. No intraperitoneal or retroperitoneal lymphadenopathy. No abdominal aortic aneurysm. No abdominal aortic atherosclerotic calcification. No pelvic sidewall lymphadenopathy. Reproductive: IUD is visualized in the uterus. There is no adnexal mass. Other: Trace free fluid is evident in the cul-de-sac. Musculoskeletal: Bone windows reveal no worrisome lytic or sclerotic osseous lesions. IMPRESSION: 1. Unremarkable exam with no acute findings in the abdomen or pelvis. Specifically, no findings to explain the patient's history of lower abdominal pain radiating to the back. No GI tract abnormality to account for the nausea and diarrhea. Electronically Signed   By: Kennith CenterEric  Mansell M.D.   On: 05/12/2017 16:40    Assessment & Plan:   Carmen Wall was seen today for rash.  Diagnoses and all orders for this visit:  Dermatitis -     triamcinolone ointment (KENALOG) 0.5 %; Apply 1 application topically 2 (two) times daily.   I am having Carmen Wall start on triamcinolone ointment. I am also having her maintain her albuterol, levonorgestrel, promethazine, and bismuth subsalicylate.  Meds ordered this encounter  Medications  . triamcinolone ointment (KENALOG) 0.5 %    Sig: Apply 1 application topically 2 (two) times daily.    Dispense:  30 g    Refill:  0    Order Specific Question:   Supervising Provider    Answer:   Dianne DunARON, TALIA M [[id number]]    Follow-up: Return if symptoms worsen or fail to improve.  Carmen Pennaharlotte Masae Lukacs, NP

## 2017-07-19 NOTE — Patient Instructions (Addendum)
Use Eucerin or Cetaphil cream to moisturize skin  May also use benadryl 12.5-25mg  oral as directed on package for itching.  Avoid hot showers  Call office if no improvement in 1week.  Consider food allergy panel, and referral to dermatology if no improvement.   Atopic Dermatitis Atopic dermatitis is a skin disorder that causes inflammation of the skin. This is the most common type of eczema. Eczema is a group of skin conditions that cause the skin to be itchy, red, and swollen. This condition is generally worse during the cooler winter months and often improves during the warm summer months. Symptoms can vary from person to person. Atopic dermatitis usually starts showing signs in infancy and can last through adulthood. This condition cannot be passed from one person to another (non-contagious), but it is more common in families. Atopic dermatitis may not always be present. When it is present, it is called a flare-up. What are the causes? The exact cause of this condition is not known. Flare-ups of the condition may be triggered by:  Contact with something that you are sensitive or allergic to.  Stress.  Certain foods.  Extremely hot or cold weather.  Harsh chemicals and soaps.  Dry air.  Chlorine.  What increases the risk? This condition is more likely to develop in people who have a personal history or family history of eczema, allergies, asthma, or hay fever. What are the signs or symptoms? Symptoms of this condition include:  Dry, scaly skin.  Red, itchy rash.  Itchiness, which can be severe. This may occur before the skin rash. This can make sleeping difficult.  Skin thickening and cracking that can occur over time.  How is this diagnosed? This condition is diagnosed based on your symptoms, a medical history, and a physical exam. How is this treated? There is no cure for this condition, but symptoms can usually be controlled. Treatment focuses on:  Controlling  the itchiness and scratching. You may be given medicines, such as antihistamines or steroid creams.  Limiting exposure to things that you are sensitive or allergic to (allergens).  Recognizing situations that cause stress and developing a plan to manage stress.  If your atopic dermatitis does not get better with medicines, or if it is all over your body (widespread), a treatment using a specific type of light (phototherapy) may be used. Follow these instructions at home: Skin care  Keep your skin well-moisturized. Doing this seals in moisture and helps to prevent dryness. ? Use unscented lotions that have petroleum in them. ? Avoid lotions that contain alcohol or water. They can dry the skin.  Keep baths or showers short (less than 5 minutes) in warm water. Do not use hot water. ? Use mild, unscented cleansers for bathing. Avoid soap and bubble bath. ? Apply a moisturizer to your skin right after a bath or shower.  Do not apply anything to your skin without checking with your health care provider. General instructions  Dress in clothes made of cotton or cotton blends. Dress lightly because heat increases itchiness.  When washing your clothes, rinse your clothes twice so all of the soap is removed.  Avoid any triggers that can cause a flare-up.  Try to manage your stress.  Keep your fingernails cut short.  Avoid scratching. Scratching makes the rash and itchiness worse. It may also result in a skin infection (impetigo) due to a break in the skin caused by scratching.  Take or apply over-the-counter and prescription medicines only as told  by your health care provider.  Keep all follow-up visits as told by your health care provider. This is important.  Do not be around people who have cold sores or fever blisters. If you get the infection, it may cause your atopic dermatitis to worsen. Contact a health care provider if:  Your itchiness interferes with sleep.  Your rash gets  worse or it is not better within one week of starting treatment.  You have a fever.  You have a rash flare-up after having contact with someone who has cold sores or fever blisters. Get help right away if:  You develop pus or soft yellow scabs in the rash area. Summary  This condition causes a red rash and itchy, dry, scaly skin.  Treatment focuses on controlling the itchiness and scratching, limiting exposure to things that you are sensitive or allergic to (allergens), recognizing situations that cause stress, and developing a plan to manage stress.  Keep your skin well-moisturized.  Keep baths or showers shorter than 5 minutes and use warm water. Do not use hot water. This information is not intended to replace advice given to you by your health care provider. Make sure you discuss any questions you have with your health care provider. Document Released: 05/20/2000 Document Revised: 06/24/2016 Document Reviewed: 06/24/2016 Elsevier Interactive Patient Education  [employer].

## 2017-08-17 ENCOUNTER — Ambulatory Visit: Payer: BLUE CROSS/BLUE SHIELD | Admitting: Nurse Practitioner

## 2017-08-17 ENCOUNTER — Encounter: Payer: Self-pay | Admitting: Nurse Practitioner

## 2017-08-17 ENCOUNTER — Ambulatory Visit (INDEPENDENT_AMBULATORY_CARE_PROVIDER_SITE_OTHER): Payer: BLUE CROSS/BLUE SHIELD

## 2017-08-17 VITALS — BP 118/62 | HR 89 | Temp 98.2°F | Ht 66.25 in | Wt 262.0 lb

## 2017-08-17 DIAGNOSIS — M5441 Lumbago with sciatica, right side: Secondary | ICD-10-CM

## 2017-08-17 DIAGNOSIS — K5900 Constipation, unspecified: Secondary | ICD-10-CM | POA: Diagnosis not present

## 2017-08-17 DIAGNOSIS — G8929 Other chronic pain: Secondary | ICD-10-CM

## 2017-08-17 DIAGNOSIS — F331 Major depressive disorder, recurrent, moderate: Secondary | ICD-10-CM

## 2017-08-17 MED ORDER — IBUPROFEN 600 MG PO TABS: 600.0000 mg | ORAL_TABLET | Freq: Three times a day (TID) | ORAL | 0 refills | Status: DC | PRN

## 2017-08-17 MED ORDER — TRAZODONE HCL 50 MG PO TABS: 50.0000 mg | ORAL_TABLET | Freq: Every day | ORAL | 0 refills | Status: DC

## 2017-08-17 MED ORDER — POLYETHYLENE GLYCOL 3350 17 G PO PACK: 17.0000 g | PACK | Freq: Every day | ORAL | 0 refills | Status: DC | PRN

## 2017-08-17 NOTE — Patient Instructions (Addendum)
I instructed pt to start 1/2 tablet once daily for 1 week and then increase to a full tablet once daily on week two as tolerated.   We discussed common side effects such as nausea, drowsiness and weight gain.  Also discussed rare but serious side effect of suicide ideation.  She is instructed to discontinue medication go directly to ED if this occurs.  Pt verbalizes understanding.    Normal lumbar x-ray.  Major Depressive Disorder, Adult Major depressive disorder (MDD) is a mental health condition. MDD often makes you feel sad, hopeless, or helpless. MDD can also cause symptoms in your body. MDD can affect your:  Work.  School.  Relationships.  Other normal activities.  MDD can range from mild to very bad. It may occur once (single episode MDD). It can also occur many times (recurrent MDD). The main symptoms of MDD often include:  Feeling sad, depressed, or irritable most of the time.  Loss of interest.  MDD symptoms also include:  Sleeping too much or too little.  Eating too much or too little.  A change in your weight.  Feeling tired (fatigue) or having low energy.  Feeling worthless.  Feeling guilty.  Trouble making decisions.  Trouble thinking clearly.  Thoughts of suicide or harming others.  Feeling weak.  Feeling agitated.  Keeping yourself from being around other people (isolation).  Follow these instructions at home: Activity  Do these things as told by your doctor: ? Go back to your normal activities. ? Exercise regularly. ? Spend time outdoors. Alcohol  Talk with your doctor about how alcohol can affect your antidepressant medicines.  Do not drink alcohol. Or, limit how much alcohol you drink. ? This means no more than 1 drink a day for nonpregnant women and 2 drinks a day for men. One drink equals one of these:  12 oz of beer.  5 oz of wine.  1 oz of hard liquor. General instructions  Take over-the-counter and prescription medicines  only as told by your doctor.  Eat a healthy diet.  Get plenty of sleep.  Find activities that you enjoy. Make time to do them.  Think about joining a support group. Your doctor may be able to suggest a group for you.  Keep all follow-up visits as told by your doctor. This is important. Where to find more information:  [employer]ness: ? www.nami.org  [employer]: ? http://www.maynard.net/www.nimh.nih.gov  National Suicide Prevention Lifeline: ? (763) 342-58001-985-003-8602. This is free, 24-hour help. Contact a doctor if:  Your symptoms get worse.  You have new symptoms. Get help right away if:  You self-harm.  You see, hear, taste, smell, or feel things that are not present (hallucinate). If you ever feel like you may hurt yourself or others, or have thoughts about taking your own life, get help right away. You can go to your nearest emergency department or call:  Your local emergency services (911 in the [place]  A suicide crisis helpline, such as the National Suicide Prevention Lifeline: ? (469) 336-36891-985-003-8602. This is open 24 hours a day.  This information is not intended to replace advice given to you by your health care provider. Make sure you discuss any questions you have with your health care provider. Document Released: 05/04/2015 Document Revised: 02/07/2016 Document Reviewed: 02/07/2016 Elsevier Interactive Patient Education  2017 [employer]
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
## 2017-08-18 ENCOUNTER — Encounter: Payer: Self-pay | Admitting: Nurse Practitioner

## 2017-08-23 ENCOUNTER — Encounter: Payer: Self-pay | Admitting: Nurse Practitioner

## 2017-09-05 ENCOUNTER — Ambulatory Visit: Payer: BLUE CROSS/BLUE SHIELD | Admitting: Nurse Practitioner

## 2017-09-05 ENCOUNTER — Encounter: Payer: Self-pay | Admitting: Nurse Practitioner

## 2017-09-05 VITALS — BP 138/92 | HR 86 | Temp 97.8°F | Ht 66.25 in | Wt 265.4 lb

## 2017-09-05 DIAGNOSIS — Z6841 Body Mass Index (BMI) 40.0 and over, adult: Secondary | ICD-10-CM | POA: Diagnosis not present

## 2017-09-05 DIAGNOSIS — R002 Palpitations: Secondary | ICD-10-CM

## 2017-09-05 DIAGNOSIS — F324 Major depressive disorder, single episode, in partial remission: Secondary | ICD-10-CM | POA: Insufficient documentation

## 2017-09-05 DIAGNOSIS — F331 Major depressive disorder, recurrent, moderate: Secondary | ICD-10-CM | POA: Diagnosis not present

## 2017-09-05 DIAGNOSIS — R632 Polyphagia: Secondary | ICD-10-CM

## 2017-09-05 DIAGNOSIS — F339 Major depressive disorder, recurrent, unspecified: Secondary | ICD-10-CM | POA: Insufficient documentation

## 2017-09-05 DIAGNOSIS — F32A Depression, unspecified: Secondary | ICD-10-CM

## 2017-09-05 HISTORY — DX: Depression, unspecified: F32.A

## 2017-09-05 LAB — BASIC METABOLIC PANEL
BUN: 12 mg/dL (ref 6–23)
CALCIUM: 9.5 mg/dL (ref 8.4–10.5)
CHLORIDE: 103 meq/L (ref 96–112)
CO2: 31 mEq/L (ref 19–32)
CREATININE: 0.64 mg/dL (ref 0.40–1.20)
GFR: 116.92 mL/min (ref >60.00)
Glucose, Bld: 88 mg/dL (ref 70–99)
Potassium: 4.4 mEq/L (ref 3.5–5.1)
Sodium: 141 mEq/L (ref 135–145)

## 2017-09-05 LAB — CBC
HEMATOCRIT: 41.3 % (ref 36.0–46.0)
Hemoglobin: 13.9 g/dL (ref 12.0–15.0)
MCHC: 33.6 g/dL (ref 30.0–36.0)
MCV: 91 fl (ref 78.0–100.0)
Platelets: 197 10*3/uL (ref 150.0–400.0)
RBC: 4.54 Mil/uL (ref 3.87–5.11)
RDW: 13.6 % (ref 11.5–15.5)
WBC: 5 10*3/uL (ref 4.0–10.5)

## 2017-09-05 LAB — TSH: TSH: 0.96 u[IU]/mL (ref 0.35–4.50)

## 2017-09-05 LAB — HEMOGLOBIN A1C: HEMOGLOBIN A1C: 4.9 % (ref 4.6–6.5)

## 2017-09-05 MED ORDER — TRAZODONE HCL 50 MG PO TABS: 50.0000 mg | ORAL_TABLET | Freq: Every day | ORAL | 1 refills | Status: DC

## 2017-09-05 NOTE — Progress Notes (Signed)
Subjective:  Patient ID: Carmen Wall [place], female    DOB: January 28, 1991  Age: 27 y.o. MRN: [id number]  CC: Follow-up (sees some changes since starting medication. Still has small outbursts, unsure if its "fully" working. Last few day started feeling like her heartbeat was not normal,..somewhat stronger/irregular. Been out of breath easily.) and Medication Problem (says pharmacy never got the prescription for Albueterol)  Palpitations   This is a new problem. The current episode started in the past 7 days. The problem occurs intermittently. The problem has been unchanged. Episode Length: <935mins. The symptoms are aggravated by stress (climbing stairs or walking). Associated symptoms include anxiety, an irregular heartbeat and shortness of breath. Pertinent negatives include no chest fullness, chest pain, coughing, diaphoresis, dizziness, fever, malaise/fatigue, nausea, near-syncope, numbness, syncope, vomiting or weakness. She has tried deep relaxation for the symptoms. The treatment provided significant relief. Risk factors include obesity, stress and sedentary lifestyle. Her past medical history is significant for anxiety. There is no history of anemia, drug use, heart disease, hyperthyroidism or a valve disorder.   Anxiety: Reports some improvement in mood and sleep with trazodone Denies any adverse effects.  Obesity: Reports difficulty with diet Reports she always feels hungry and resorts to eating when bored. Use of phentermine in past x 2months but unable to continue due to lack of finance. Current member of weight watchers but has not ben participating.  Outpatient Medications Prior to Visit  Medication Sig Dispense Refill  . ibuprofen (ADVIL,MOTRIN) 600 MG tablet Take 1 tablet (600 mg total) by mouth every 8 (eight) hours as needed. 30 tablet 0  . polyethylene glycol (MIRALAX / GLYCOLAX) packet Take 17 g by mouth daily as needed. 14 each 0  . traZODone (DESYREL) 50 MG tablet Take 1  tablet (50 mg total) by mouth at bedtime. 30 tablet 0  . albuterol (PROVENTIL HFA;VENTOLIN HFA) 108 (90 BASE) MCG/ACT inhaler Inhale 2 puffs into the lungs every 4 (four) hours as needed for wheezing or shortness of breath (cough, shortness of breath or wheezing.). (Patient not taking: Reported on 09/05/2017) 1 Inhaler 1   No facility-administered medications prior to visit.     ROS See HPI  Objective:  BP (!) 138/92 (BP Location: Left Arm, Patient Position: Sitting, Cuff Size: Normal)   Pulse 86   Temp 97.8 F (36.6 C) (Oral)   Ht 5' 6.25" (1.683 Wall)   Wt 265 lb 6.4 oz (120.4 kg)   SpO2 98%   BMI 42.51 kg/Wall   BP Readings from Last 3 Encounters:  09/05/17 (!) 138/92  08/17/17 118/62  07/19/17 110/78    Wt Readings from Last 3 Encounters:  09/05/17 265 lb 6.4 oz (120.4 kg)  08/17/17 262 lb (118.8 kg)  07/19/17 261 lb (118.4 kg)    Physical Exam  Constitutional: She is oriented to person, place, and time. No distress.  Neck: Normal range of motion. Neck supple. No thyromegaly present.  Cardiovascular: Normal rate, regular rhythm and normal heart sounds.  Pulmonary/Chest: Effort normal and breath sounds normal.  Abdominal: Soft.  Musculoskeletal: She exhibits no edema.  Neurological: She is alert and oriented to person, place, and time.  Vitals reviewed.   Lab Results  Component Value Date   WBC 5.0 09/05/2017   HGB 13.9 09/05/2017   HCT 41.3 09/05/2017   PLT 197.0 09/05/2017   GLUCOSE 88 09/05/2017   CHOL 214 (H) 07/12/2016   TRIG 123.0 07/12/2016   HDL 50.20 07/12/2016   LDLCALC 140 (H) 07/12/2016  ALT 12 05/12/2017   AST 13 05/12/2017   NA 141 09/05/2017   K 4.4 09/05/2017   CL 103 09/05/2017   CREATININE 0.64 09/05/2017   BUN 12 09/05/2017   CO2 31 09/05/2017   TSH 0.96 09/05/2017   HGBA1C 4.9 09/05/2017    Ct Abdomen Pelvis W Contrast  Result Date: 05/12/2017 CLINICAL DATA:  Lower abdominal pain radiates to low back. Associated with nausea diarrhea  and chills. EXAM: CT ABDOMEN AND PELVIS WITH CONTRAST TECHNIQUE: Multidetector CT imaging of the abdomen and pelvis was performed using the standard protocol following bolus administration of intravenous contrast. CONTRAST:  ISOVUE-300 IOPAMIDOL (ISOVUE-300) INJECTION 61% COMPARISON:  07/25/2015 FINDINGS: Lower chest:  Unremarkable. Hepatobiliary: No focal abnormality within the liver parenchyma. There is no evidence for gallstones, gallbladder wall thickening, or pericholecystic fluid. No intrahepatic or extrahepatic biliary dilation. Pancreas: No focal mass lesion. No dilatation of the main duct. No intraparenchymal cyst. No peripancreatic edema. Spleen: 10 mm hypoattenuating subcapsular lesion in the posterior spleen and is likely a small cyst or pseudocyst. Adrenals/Urinary Tract: No adrenal nodule or mass. Kidneys are unremarkable. No evidence for hydroureter. The urinary bladder appears normal for the degree of distention. Stomach/Bowel: Stomach is nondistended. No gastric wall thickening. No evidence of outlet obstruction. Duodenum is normally positioned as is the ligament of Treitz. No small bowel wall thickening. No small bowel dilatation. The terminal ileum is normal. No gross colonic mass. No colonic wall thickening. No substantial diverticular change. Vascular/Lymphatic: There is no gastrohepatic or hepatoduodenal ligament lymphadenopathy. No intraperitoneal or retroperitoneal lymphadenopathy. No abdominal aortic aneurysm. No abdominal aortic atherosclerotic calcification. No pelvic sidewall lymphadenopathy. Reproductive: IUD is visualized in the uterus. There is no adnexal mass. Other: Trace free fluid is evident in the cul-de-sac. Musculoskeletal: Bone windows reveal no worrisome lytic or sclerotic osseous lesions. IMPRESSION: 1. Unremarkable exam with no acute findings in the abdomen or pelvis. Specifically, no findings to explain the patient's history of lower abdominal pain radiating to the  back. No GI tract abnormality to account for the nausea and diarrhea. Electronically Signed   By: Kennith Center Wall.D.   On: 05/12/2017 16:40    Assessment & Plan:   Ieshia was seen today for follow-up and medication problem.  Diagnoses and all orders for this visit:  Moderate episode of recurrent major depressive disorder (HCC) -     traZODone (DESYREL) 50 MG tablet; Take 1 tablet (50 mg total) by mouth at bedtime. -     Basic metabolic panel  Increased appetite -     TSH -     Hemoglobin A1c  Intermittent palpitations -     CBC -     TSH -     Basic metabolic panel  Class 3 severe obesity without serious comorbidity with body mass index (BMI) of 40.0 to 44.9 in adult, unspecified obesity type (HCC)   I am having Dawnn Wall. [place] maintain her albuterol, ibuprofen, polyethylene glycol, and traZODone.  Meds ordered this encounter  Medications  . traZODone (DESYREL) 50 MG tablet    Sig: Take 1 tablet (50 mg total) by mouth at bedtime.    Dispense:  30 tablet    Refill:  1    Order Specific Question:   Supervising Provider    Answer:   Dianne Dun [[id number]]    Follow-up: Return in about 1 month (around 10/03/2017) for anxiety and weight loss.  Alysia Penna, NP

## 2017-09-05 NOTE — Assessment & Plan Note (Signed)
Consider use of metformin and wellbutrin if resolved palpitations? Re eval in 36month Encourage use of food diary to count calories, and walking ( ) a day.

## 2017-09-05 NOTE — Assessment & Plan Note (Signed)
Improved with trazodone  

## 2017-09-05 NOTE — Patient Instructions (Addendum)
Continue trazodone as prescribed.  Normal lab results.  Return to office sooner if palpitations get more frequent or last longer  Calorie Counting for Weight Loss Calories are units of energy. Your body needs a certain amount of calories from food to keep you going throughout the day. When you eat more calories than your body needs, your body stores the extra calories as fat. When you eat fewer calories than your body needs, your body burns fat to get the energy it needs. Calorie counting means keeping track of how many calories you eat and drink each day. Calorie counting can be helpful if you need to lose weight. If you make sure to eat fewer calories than your body needs, you should lose weight. Ask your health care provider what a healthy weight is for you. For calorie counting to work, you will need to eat the right number of calories in a day in order to lose a healthy amount of weight per week. A dietitian can help you determine how many calories you need in a day and will give you suggestions on how to reach your calorie goal.  A healthy amount of weight to lose per week is usually 1-2 lb (0.5-0.9 kg). This usually means that your daily calorie intake should be reduced by 500-750 calories.  Eating 1,200 - 1,500 calories per day can help most women lose weight.  Eating 1,500 - 1,800 calories per day can help most men lose weight.  What is my plan? My goal is to have __1500________ calories per day. If I have this many calories per day, I should lose around ____1______ pounds per week. What do I need to know about calorie counting? In order to meet your daily calorie goal, you will need to:  Find out how many calories are in each food you would like to eat. Try to do this before you eat.  Decide how much of the food you plan to eat.  Write down what you ate and how many calories it had. Doing this is called keeping a food log.  To successfully lose weight, it is important to  balance calorie counting with a healthy lifestyle that includes regular activity. Aim for 150 minutes of moderate exercise (such as walking) or 75 minutes of vigorous exercise (such as running) each week. Where do I find calorie information?  The number of calories in a food can be found on a Nutrition Facts label. If a food does not have a Nutrition Facts label, try to look up the calories online or ask your dietitian for help. Remember that calories are listed per serving. If you choose to have more than one serving of a food, you will have to multiply the calories per serving by the amount of servings you plan to eat. For example, the label on a package of bread might say that a serving size is 1 slice and that there are 90 calories in a serving. If you eat 1 slice, you will have eaten 90 calories. If you eat 2 slices, you will have eaten 180 calories. How do I keep a food log? Immediately after each meal, record the following information in your food log:  What you ate. Don't forget to include toppings, sauces, and other extras on the food.  How much you ate. This can be measured in cups, ounces, or number of items.  How many calories each food and drink had.  The total number of calories in the meal.  Keep  your food log near you, such as in a small notebook in your pocket, or use a mobile app or website. Some programs will calculate calories for you and show you how many calories you have left for the day to meet your goal. What are some calorie counting tips?  Use your calories on foods and drinks that will fill you up and not leave you hungry: ? Some examples of foods that fill you up are nuts and nut butters, vegetables, lean proteins, and high-fiber foods like whole grains. High-fiber foods are foods with more than 5 g fiber per serving. ? Drinks such as sodas, specialty coffee drinks, alcohol, and juices have a lot of calories, yet do not fill you up.  Eat nutritious foods and avoid  empty calories. Empty calories are calories you get from foods or beverages that do not have many vitamins or protein, such as candy, sweets, and soda. It is better to have a nutritious high-calorie food (such as an avocado) than a food with few nutrients (such as a bag of chips).  Know how many calories are in the foods you eat most often. This will help you calculate calorie counts faster.  Pay attention to calories in drinks. Low-calorie drinks include water and unsweetened drinks.  Pay attention to nutrition labels for "low fat" or "fat free" foods. These foods sometimes have the same amount of calories or more calories than the full fat versions. They also often have added sugar, starch, or salt, to make up for flavor that was removed with the fat.  Find a way of tracking calories that works for you. Get creative. Try different apps or programs if writing down calories does not work for you. What are some portion control tips?  Know how many calories are in a serving. This will help you know how many servings of a certain food you can have.  Use a measuring cup to measure serving sizes. You could also try weighing out portions on a kitchen scale. With time, you will be able to estimate serving sizes for some foods.  Take some time to put servings of different foods on your favorite plates, bowls, and cups so you know what a serving looks like.  Try not to eat straight from a bag or box. Doing this can lead to overeating. Put the amount you would like to eat in a cup or on a plate to make sure you are eating the right portion.  Use smaller plates, glasses, and bowls to prevent overeating.  Try not to multitask (for example, watch TV or use your computer) while eating. If it is time to eat, sit down at a table and enjoy your food. This will help you to know when you are full. It will also help you to be aware of what you are eating and how much you are eating. What are tips for following  this plan? Reading food labels  Check the calorie count compared to the serving size. The serving size may be smaller than what you are used to eating.  Check the source of the calories. Make sure the food you are eating is high in vitamins and protein and low in saturated and trans fats. Shopping  Read nutrition labels while you shop. This will help you make healthy decisions before you decide to purchase your food.  Make a grocery list and stick to it. Cooking  Try to cook your favorite foods in a healthier way. For example, try  baking instead of frying.  Use low-fat dairy products. Meal planning  Use more fruits and vegetables. Half of your plate should be fruits and vegetables.  Include lean proteins like poultry and fish. How do I count calories when eating out?  Ask for smaller portion sizes.  Consider sharing an entree and sides instead of getting your own entree.  If you get your own entree, eat only half. Ask for a box at the beginning of your meal and put the rest of your entree in it so you are not tempted to eat it.  If calories are listed on the menu, choose the lower calorie options.  Choose dishes that include vegetables, fruits, whole grains, low-fat dairy products, and lean protein.  Choose items that are boiled, broiled, grilled, or steamed. Stay away from items that are buttered, battered, fried, or served with cream sauce. Items labeled "crispy" are usually fried, unless stated otherwise.  Choose water, low-fat milk, unsweetened iced tea, or other drinks without added sugar. If you want an alcoholic beverage, choose a lower calorie option such as a glass of wine or light beer.  Ask for dressings, sauces, and syrups on the side. These are usually high in calories, so you should limit the amount you eat.  If you want a salad, choose a garden salad and ask for grilled meats. Avoid extra toppings like bacon, cheese, or fried items. Ask for the dressing on the  side, or ask for olive oil and vinegar or lemon to use as dressing.  Estimate how many servings of a food you are given. For example, a serving of cooked rice is  cup or about the size of half a baseball. Knowing serving sizes will help you be aware of how much food you are eating at restaurants. The list below tells you how big or small some common portion sizes are based on everyday objects: ? 1 oz-4 stacked dice. ? 3 oz-1 deck of cards. ? 1 tsp-1 die. ? 1 Tbsp- a ping-pong ball. ? 2 Tbsp-1 ping-pong ball. ?  cup- baseball. ? 1 cup-1 baseball. Summary  Calorie counting means keeping track of how many calories you eat and drink each day. If you eat fewer calories than your body needs, you should lose weight.  A healthy amount of weight to lose per week is usually 1-2 lb (0.5-0.9 kg). This usually means reducing your daily calorie intake by 500-750 calories.  The number of calories in a food can be found on a Nutrition Facts label. If a food does not have a Nutrition Facts label, try to look up the calories online or ask your dietitian for help.  Use your calories on foods and drinks that will fill you up, and not on foods and drinks that will leave you hungry.  Use smaller plates, glasses, and bowls to prevent overeating. This information is not intended to replace advice given to you by your health care provider. Make sure you discuss any questions you have with your health care provider. Document Released: 05/23/2005 Document Revised: 04/22/2016 Document Reviewed: 04/22/2016 Elsevier Interactive Patient Education  2018 [employer]   Palpitations A palpitation is the feeling that your heart:  Has an uneven (irregular) heartbeat.  Is beating faster than normal.  Is fluttering.  Is skipping a beat.  This is usually not a serious problem. In some cases, you may need more medical tests. Follow these instructions at home:  Avoid: ? Caffeine in coffee, tea, soft drinks,  diet  pills, and energy drinks. ? Chocolate. ? Alcohol.  Do not use any tobacco products. These include cigarettes, chewing tobacco, and e-cigarettes. If you need help quitting, ask your doctor.  Try to reduce your stress. These things may help: ? Yoga. ? Meditation. ? Physical activity. Swimming, jogging, and walking are good choices. ? A method that helps you use your mind to control things in your body, like heartbeats (biofeedback).  Get plenty of rest and sleep.  Take over-the-counter and prescription medicines only as told by your doctor.  Keep all follow-up visits as told by your doctor. This is important. Contact a doctor if:  Your heartbeat is still fast or uneven after 24 hours.  Your palpitations occur more often. Get help right away if:  You have chest pain.  You feel short of breath.  You have a very bad headache.  You feel dizzy.  You pass out (faint). This information is not intended to replace advice given to you by your health care provider. Make sure you discuss any questions you have with your health care provider. Document Released: 03/01/2008 Document Revised: 10/29/2015 Document Reviewed: 02/05/2015 Elsevier Interactive Patient Education  [employer].

## 2017-09-18 ENCOUNTER — Telehealth: Payer: Self-pay | Admitting: Nurse Practitioner

## 2017-09-18 NOTE — Telephone Encounter (Signed)
Copied from CRM [phone]. Topic: Quick Communication - See Telephone Encounter >> Sep 18, 2017  4:08 PM Windy KalataMichael, Alekai Pocock L, NT wrote: CRM for notification. See Telephone encounter for: 09/18/17.  Patient is calling and states that the pharmacy has not received the rx for albuterol (PROVENTIL HFA;VENTOLIN HFA) 108 (90 BASE) MCG/ACT inhaler from 09/08/17. Please advise.

## 2017-09-19 NOTE — Telephone Encounter (Signed)
Called pt to ask what she needs the albuterol for, ask for a return phone call. TLG

## 2017-10-06 ENCOUNTER — Ambulatory Visit: Payer: BLUE CROSS/BLUE SHIELD | Admitting: Nurse Practitioner

## 2017-10-20 ENCOUNTER — Encounter: Payer: Self-pay | Admitting: Nurse Practitioner

## 2017-10-20 ENCOUNTER — Ambulatory Visit: Payer: BLUE CROSS/BLUE SHIELD | Admitting: Nurse Practitioner

## 2017-10-20 VITALS — BP 110/70 | HR 70 | Temp 98.1°F | Ht 66.25 in | Wt 267.0 lb

## 2017-10-20 DIAGNOSIS — G8929 Other chronic pain: Secondary | ICD-10-CM

## 2017-10-20 DIAGNOSIS — Z6841 Body Mass Index (BMI) 40.0 and over, adult: Secondary | ICD-10-CM

## 2017-10-20 DIAGNOSIS — E66813 Obesity, class 3: Secondary | ICD-10-CM

## 2017-10-20 DIAGNOSIS — J9801 Acute bronchospasm: Secondary | ICD-10-CM | POA: Diagnosis not present

## 2017-10-20 DIAGNOSIS — M5441 Lumbago with sciatica, right side: Secondary | ICD-10-CM | POA: Diagnosis not present

## 2017-10-20 DIAGNOSIS — F331 Major depressive disorder, recurrent, moderate: Secondary | ICD-10-CM

## 2017-10-20 DIAGNOSIS — J302 Other seasonal allergic rhinitis: Secondary | ICD-10-CM | POA: Diagnosis not present

## 2017-10-20 DIAGNOSIS — Z713 Dietary counseling and surveillance: Secondary | ICD-10-CM

## 2017-10-20 DIAGNOSIS — R0602 Shortness of breath: Secondary | ICD-10-CM

## 2017-10-20 MED ORDER — PHENTERMINE HCL 37.5 MG PO CAPS: 37.5000 mg | ORAL_CAPSULE | ORAL | 1 refills | Status: DC

## 2017-10-20 MED ORDER — IBUPROFEN 600 MG PO TABS: 600.0000 mg | ORAL_TABLET | Freq: Three times a day (TID) | ORAL | 3 refills | Status: DC | PRN

## 2017-10-20 MED ORDER — ALBUTEROL SULFATE HFA 108 (90 BASE) MCG/ACT IN AERS: 1.0000 | INHALATION_SPRAY | RESPIRATORY_TRACT | 0 refills | Status: DC | PRN

## 2017-10-20 MED ORDER — PHENTERMINE HCL 37.5 MG PO TABS: 37.5000 mg | ORAL_TABLET | Freq: Every day | ORAL | 1 refills | Status: DC

## 2017-10-20 MED ORDER — TRAZODONE HCL 50 MG PO TABS: 50.0000 mg | ORAL_TABLET | Freq: Every day | ORAL | 1 refills | Status: DC

## 2017-10-20 NOTE — Progress Notes (Signed)
Subjective:  Patient ID: Carmen Wall, female    DOB: Jul 02, 1990  Age: 27 y.o. MRN: [id number]  CC: Follow-up (1 mo f/u--weight loss consult)   HPI   Anxiety: Reports significantly Improved mood with trazodone. Denies any adverse effects. Denies any palpitations or panic episodes  Obesity: Exercise:Swimming and weight training, walking. Done daily. Diet: small portions and heart healthy choices. Unable to loss wight with lifestyle changes. Will like to use phentermine again.  Hx of Cough due to bronchospasm: Reports this happens only during Spring season or if around strong smells. Relief with use of albuterol in past, so she will like refill. Denies any symptoms at this time.  Outpatient Medications Prior to Visit  Medication Sig Dispense Refill  . polyethylene glycol (MIRALAX / GLYCOLAX) packet Take 17 g by mouth daily as needed. 14 each 0  . albuterol (PROVENTIL HFA;VENTOLIN HFA) 108 (90 BASE) MCG/ACT inhaler Inhale 2 puffs into the lungs every 4 (four) hours as needed for wheezing or shortness of breath (cough, shortness of breath or wheezing.). 1 Inhaler 1  . ibuprofen (ADVIL,MOTRIN) 600 MG tablet Take 1 tablet (600 mg total) by mouth every 8 (eight) hours as needed. 30 tablet 0  . traZODone (DESYREL) 50 MG tablet Take 1 tablet (50 mg total) by mouth at bedtime. 30 tablet 1   No facility-administered medications prior to visit.     ROS See HPI  Objective:  BP 110/70   Pulse 70   Temp 98.1 F (36.7 C) (Oral)   Ht 5' 6.25" (1.683 m)   Wt 267 lb (121.1 kg)   SpO2 99%   BMI 42.77 kg/m   BP Readings from Last 3 Encounters:  10/20/17 110/70  09/05/17 (!) 138/92  08/17/17 118/62    Wt Readings from Last 3 Encounters:  10/20/17 267 lb (121.1 kg)  09/05/17 265 lb 6.4 oz (120.4 kg)  08/17/17 262 lb (118.8 kg)    Physical Exam  Constitutional: She is oriented to person, place, and time. No distress.  Cardiovascular: Normal rate.  Pulmonary/Chest:  Effort normal. No respiratory distress.  Neurological: She is alert and oriented to person, place, and time.  Psychiatric: She has a normal mood and affect. Her behavior is normal. Thought content normal.  Vitals reviewed.   Lab Results  Component Value Date   WBC 5.0 09/05/2017   HGB 13.9 09/05/2017   HCT 41.3 09/05/2017   PLT 197.0 09/05/2017   GLUCOSE 88 09/05/2017   CHOL 214 (H) 07/12/2016   TRIG 123.0 07/12/2016   HDL 50.20 07/12/2016   LDLCALC 140 (H) 07/12/2016   ALT 12 05/12/2017   AST 13 05/12/2017   NA 141 09/05/2017   K 4.4 09/05/2017   CL 103 09/05/2017   CREATININE 0.64 09/05/2017   BUN 12 09/05/2017   CO2 31 09/05/2017   TSH 0.96 09/05/2017   HGBA1C 4.9 09/05/2017    Ct Abdomen Pelvis W Contrast  Result Date: 05/12/2017 CLINICAL DATA:  Lower abdominal pain radiates to low back. Associated with nausea diarrhea and chills. EXAM: CT ABDOMEN AND PELVIS WITH CONTRAST TECHNIQUE: Multidetector CT imaging of the abdomen and pelvis was performed using the standard protocol following bolus administration of intravenous contrast. CONTRAST:  ISOVUE-300 IOPAMIDOL (ISOVUE-300) INJECTION 61% COMPARISON:  07/25/2015 FINDINGS: Lower chest:  Unremarkable. Hepatobiliary: No focal abnormality within the liver parenchyma. There is no evidence for gallstones, gallbladder wall thickening, or pericholecystic fluid. No intrahepatic or extrahepatic biliary dilation. Pancreas: No focal mass lesion. No dilatation  of the main duct. No intraparenchymal cyst. No peripancreatic edema. Spleen: 10 mm hypoattenuating subcapsular lesion in the posterior spleen and is likely a small cyst or pseudocyst. Adrenals/Urinary Tract: No adrenal nodule or mass. Kidneys are unremarkable. No evidence for hydroureter. The urinary bladder appears normal for the degree of distention. Stomach/Bowel: Stomach is nondistended. No gastric wall thickening. No evidence of outlet obstruction. Duodenum is normally positioned  as is the ligament of Treitz. No small bowel wall thickening. No small bowel dilatation. The terminal ileum is normal. No gross colonic mass. No colonic wall thickening. No substantial diverticular change. Vascular/Lymphatic: There is no gastrohepatic or hepatoduodenal ligament lymphadenopathy. No intraperitoneal or retroperitoneal lymphadenopathy. No abdominal aortic aneurysm. No abdominal aortic atherosclerotic calcification. No pelvic sidewall lymphadenopathy. Reproductive: IUD is visualized in the uterus. There is no adnexal mass. Other: Trace free fluid is evident in the cul-de-sac. Musculoskeletal: Bone windows reveal no worrisome lytic or sclerotic osseous lesions. IMPRESSION: 1. Unremarkable exam with no acute findings in the abdomen or pelvis. Specifically, no findings to explain the patient's history of lower abdominal pain radiating to the back. No GI tract abnormality to account for the nausea and diarrhea. Electronically Signed   By: Kennith Center M.D.   On: 05/12/2017 16:40    Assessment & Plan:   Carmen Wall was seen today for follow-up.  Diagnoses and all orders for this visit:  Class 3 severe obesity due to excess calories without serious comorbidity with body mass index (BMI) of 40.0 to 44.9 in adult [facility]) -     Discontinue: phentermine 37.5 MG capsule; Take 1 capsule (37.5 mg total) by mouth every morning. -     phentermine (ADIPEX-P) 37.5 MG tablet; Take 1 tablet (37.5 mg total) by mouth daily before breakfast.  Chronic midline low back pain with right-sided sciatica -     ibuprofen (ADVIL,MOTRIN) 600 MG tablet; Take 1 tablet (600 mg total) by mouth every 8 (eight) hours as needed.  Moderate episode of recurrent major depressive disorder (HCC) -     traZODone (DESYREL) 50 MG tablet; Take 1 tablet (50 mg total) by mouth at bedtime.  SOB (shortness of breath)  Bronchospasm -     albuterol (PROVENTIL HFA;VENTOLIN HFA) 108 (90 Base) MCG/ACT inhaler; Inhale 1 puff into the lungs  every 4 (four) hours as needed for wheezing or shortness of breath (cough, shortness of breath or wheezing.).  Weight loss counseling, encounter for -     phentermine (ADIPEX-P) 37.5 MG tablet; Take 1 tablet (37.5 mg total) by mouth daily before breakfast.  Seasonal allergic rhinitis, unspecified trigger -     albuterol (PROVENTIL HFA;VENTOLIN HFA) 108 (90 Base) MCG/ACT inhaler; Inhale 1 puff into the lungs every 4 (four) hours as needed for wheezing or shortness of breath (cough, shortness of breath or wheezing.).   I have discontinued Carmen Wall's phentermine. I have also changed her albuterol. Additionally, I am having her start on phentermine. Lastly, I am having her maintain her polyethylene glycol, ibuprofen, and traZODone.  Meds ordered this encounter  Medications  . DISCONTD: phentermine 37.5 MG capsule    Sig: Take 1 capsule (37.5 mg total) by mouth every morning.    Dispense:  30 capsule    Refill:  1    Order Specific Question:   Supervising Provider    Answer:   Dianne Dun [[id number]]  . ibuprofen (ADVIL,MOTRIN) 600 MG tablet    Sig: Take 1 tablet (600 mg total) by mouth every 8 (eight) hours  as needed.    Dispense:  30 tablet    Refill:  3    Order Specific Question:   Supervising Provider    Answer:   Dianne Dun [[id number]]  . traZODone (DESYREL) 50 MG tablet    Sig: Take 1 tablet (50 mg total) by mouth at bedtime.    Dispense:  90 tablet    Refill:  1    Order Specific Question:   Supervising Provider    Answer:   Dianne Dun [[id number]]  . albuterol (PROVENTIL HFA;VENTOLIN HFA) 108 (90 Base) MCG/ACT inhaler    Sig: Inhale 1 puff into the lungs every 4 (four) hours as needed for wheezing or shortness of breath (cough, shortness of breath or wheezing.).    Dispense:  1 Inhaler    Refill:  0    Order Specific Question:   Supervising Provider    Answer:   Dianne Dun [[id number]]  . phentermine (ADIPEX-P) 37.5 MG tablet    Sig: Take 1 tablet (37.5 mg total) by mouth  daily before breakfast.    Dispense:  30 tablet    Refill:  1    Order Specific Question:   Supervising Provider    Answer:   Dianne Dun [[id number]]    Follow-up: Return in about 2 months (around 12/20/2017) for weight loss.  Alysia Penna, NP

## 2018-02-06 ENCOUNTER — Encounter: Payer: Self-pay | Admitting: Obstetrics and Gynecology

## 2018-02-06 ENCOUNTER — Telehealth: Payer: Self-pay | Admitting: Obstetrics and Gynecology

## 2018-02-06 ENCOUNTER — Ambulatory Visit: Payer: BLUE CROSS/BLUE SHIELD | Admitting: Obstetrics and Gynecology

## 2018-02-06 ENCOUNTER — Other Ambulatory Visit: Payer: Self-pay

## 2018-02-06 ENCOUNTER — Ambulatory Visit (INDEPENDENT_AMBULATORY_CARE_PROVIDER_SITE_OTHER): Payer: BLUE CROSS/BLUE SHIELD

## 2018-02-06 VITALS — BP 118/68 | HR 92 | Resp 16 | Ht 67.0 in | Wt 272.0 lb

## 2018-02-06 DIAGNOSIS — R102 Pelvic and perineal pain: Secondary | ICD-10-CM

## 2018-02-06 DIAGNOSIS — R3915 Urgency of urination: Secondary | ICD-10-CM

## 2018-02-06 DIAGNOSIS — F3281 Premenstrual dysphoric disorder: Secondary | ICD-10-CM

## 2018-02-06 DIAGNOSIS — R5383 Other fatigue: Secondary | ICD-10-CM

## 2018-02-06 DIAGNOSIS — K582 Mixed irritable bowel syndrome: Secondary | ICD-10-CM

## 2018-02-06 DIAGNOSIS — R109 Unspecified abdominal pain: Secondary | ICD-10-CM | POA: Diagnosis not present

## 2018-02-06 DIAGNOSIS — Z113 Encounter for screening for infections with a predominantly sexual mode of transmission: Secondary | ICD-10-CM | POA: Diagnosis not present

## 2018-02-06 LAB — POCT URINALYSIS DIPSTICK
Bilirubin, UA: NEGATIVE
Blood, UA: NEGATIVE
GLUCOSE UA: NEGATIVE
KETONES UA: NEGATIVE
LEUKOCYTES UA: NEGATIVE
Nitrite, UA: NEGATIVE
PH UA: 7 (ref 5.0–8.0)
PROTEIN UA: NEGATIVE
Urobilinogen, UA: 0.2 E.U./dL

## 2018-02-06 LAB — POCT URINE PREGNANCY: PREG TEST UR: NEGATIVE

## 2018-02-06 MED ORDER — ESCITALOPRAM OXALATE 10 MG PO TABS: ORAL_TABLET | ORAL | 1 refills | Status: DC

## 2018-02-06 NOTE — Telephone Encounter (Signed)
Patient is having some pain and cramping "near her right ovary".Marland Kitchen

## 2018-02-06 NOTE — Progress Notes (Signed)
GYNECOLOGY  VISIT   HPI: 27 y.o.   Legally Separated  Caucasian  female   [id number] with Patient's last menstrual period was 01/25/2018.   here for   Cramping and right sided pain  The patient has a mirena IUD, placed in 10/18. She is having light, monthly cycles for 3-5 days. She typically gets mild cramping prior to and with her cycle. Occasionally moderate cramps.  For the last 2 weeks she has been having continued menstrual cramps. Worse in the morning and better as the day goes on. The pain is up to a 3-4/10 in severity. She has had some light pink spotting.  She is sexually active. Same partner x 2 weeks. No dyspareunia. Not using condoms. She sometimes has some mild cramping after sex.  For the last year she has gotten intermittent pain in her right groin, that can radiate down the front of her leg.  She feels like she has PMDD. This has been going on for the last 1-2 years. Symptoms are getting worse. She has depression, anxiety, panic attacks, mood change. Symptoms are for a week prior to her cycle. She has some other mood changes during the month, but not like the week prior to her cycle.  She c/o fatigue, no matter how much sleep she gets.  No fevers, emesis. Some urinary urgency.  No nausea, some loss of appetite.  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 Stress at work, exhausted on her days off. She is also a single mom.  GYNECOLOGIC HISTORY: Patient's last menstrual period was 01/25/2018. Contraception: IUD Menopausal hormone therapy: none         OB History    Gravida  1   Para  1   Term  1   Preterm      AB      Living  1     SAB      TAB      Ectopic      Multiple      Live Births  1              Patient Active Problem List   Diagnosis Date Noted  . Moderate episode of recurrent major depressive disorder (HCC) 09/05/2017  . Migraine without aura   . Arthritis   . Anxiety   .  Allergy   . Increased bowel frequency 07/12/2016  . Increased urinary frequency 07/12/2016  . Class 3 severe obesity without serious comorbidity with body mass index (BMI) of 40.0 to 44.9 in adult [facility]) 09/26/2014  . Rhinitis, allergic 09/09/2014    Past Medical History:  Diagnosis Date  . Allergy   . Anxiety   . Arthritis   . Migraine without aura     Past Surgical History:  Procedure Laterality Date  . INTRAUTERINE DEVICE (IUD) INSERTION  04/2017   Mirena   . KNEE ARTHROSCOPY Right     Current Outpatient Medications  Medication Sig Dispense Refill  . albuterol (PROVENTIL HFA;VENTOLIN HFA) 108 (90 Base) MCG/ACT inhaler Inhale 1 puff into the lungs every 4 (four) hours as needed for wheezing or shortness of breath (cough, shortness of breath or wheezing.). 1 Inhaler 0  . ibuprofen (ADVIL,MOTRIN) 600 MG tablet Take 1 tablet (600 mg total) by mouth every 8 (eight) hours as needed. 30 tablet 3  . polyethylene glycol (MIRALAX / GLYCOLAX) packet Take 17 g by mouth daily as needed. 14 each 0  .  traZODone (DESYREL) 50 MG tablet Take 1 tablet (50 mg total) by mouth at bedtime. 90 tablet 1  . phentermine (ADIPEX-P) 37.5 MG tablet Take 1 tablet (37.5 mg total) by mouth daily before breakfast. (Patient not taking: Reported on 02/06/2018) 30 tablet 1   No current facility-administered medications for this visit.      ALLERGIES: Patient has no known allergies.  Family History  Problem Relation Age of Onset  . Diabetes Father   . Arthritis Father   . Depression Father   . Mental illness Father        PTSD, anxiety  . Arthritis Mother        osteoartthritis  . Fibroids Mother   . Ovarian cysts Mother   . Kidney disease Maternal Grandmother   . Arthritis Maternal Grandmother   . Prostate cancer Maternal Grandfather   . Arthritis Maternal Grandfather   . Arthritis Paternal Grandmother   . Diabetes Paternal Grandfather   . Mental illness Paternal Grandfather   . Arthritis Paternal  Grandfather     Social History   Socioeconomic History  . Marital status: Legally Separated    Spouse name: Not on file  . Number of children: Not on file  . Years of education: Not on file  . Highest education level: Not on file  Occupational History  . Not on file  Social Needs  . Financial resource strain: Not on file  . Food insecurity:    Worry: Not on file    Inability: Not on file  . Transportation needs:    Medical: Not on file    Non-medical: Not on file  Tobacco Use  . Smoking status: Never Smoker  . Smokeless tobacco: Never Used  Substance and Sexual Activity  . Alcohol use: No    Alcohol/week: 0.0 standard drinks  . Drug use: No  . Sexual activity: Yes    Partners: Male    Birth control/protection: IUD  Lifestyle  . Physical activity:    Days per week: Not on file    Minutes per session: Not on file  . Stress: Not on file  Relationships  . Social connections:    Talks on phone: Not on file    Gets together: Not on file    Attends religious service: Not on file    Active member of club or organization: Not on file    Attends meetings of clubs or organizations: Not on file    Relationship status: Not on file  . Intimate partner violence:    Fear of current or ex partner: Not on file    Emotionally abused: Not on file    Physically abused: Not on file    Forced sexual activity: Not on file  Other Topics Concern  . Not on file  Social History Narrative  . Not on file    Review of Systems  Constitutional: Negative.   HENT: Negative.   Eyes: Negative.   Respiratory: Negative.   Cardiovascular: Positive for leg swelling.  Gastrointestinal: Positive for abdominal pain.       Bloating   Genitourinary: Positive for urgency.       Breast pain  Unscheduled bleeding/spotting   Musculoskeletal: Negative.   Skin: Negative.   Neurological: Positive for headaches.  Endo/Heme/Allergies: Negative.        Excessive thirst Craving sweets    Psychiatric/Behavioral: Negative.     PHYSICAL EXAMINATION:    BP 140/78 (BP Location: Right Arm, Patient Position: Sitting, Cuff Size: Large)  Pulse 92   Resp 16   Ht 5\' 7"  (1.702 m)   Wt 272 lb (123.4 kg)   LMP 01/25/2018   BMI 42.60 kg/m     General appearance: alert, cooperative and appears stated age Neck: no adenopathy, supple, symmetrical, trachea midline and thyroid normal to inspection and palpation Abdomen: soft, tender in BLQ, no rebound, no guarding; non distended, no masses,  no organomegaly  Pelvic: External genitalia:  no lesions              Urethra:  normal appearing urethra with no masses, tenderness or lesions              Bartholins and Skenes: normal                 Vagina: normal appearing vagina with normal color and discharge, no lesions              Cervix: no cervical motion tenderness, no lesions and IUD string 2 cm              Bimanual Exam:  Uterus:  normal size, contour, position, consistency, mobility, non-tender              Adnexa: tender and some fullness in the right adnexal region              Rectovaginal: Yes.  .  Confirms.              Anus:  normal sphincter tone, no lesions  Chaperone was present for exam.  ASSESSMENT Abdominal/pelvic pain Tender and fullness in right adnexa Fatigue IBS with constipation and diarrhea Urinary urgency Screening STD PMDD IUD check, normal string length   PLAN CBC with diff UPT Pelvic ultrasound today Screening std TSH Urine for ua, c&s Start lexapro, f/u in one month for an annual exam   An After Visit Summary was printed and given to the patient.  Addendum: patient returned for a pelvic ultrasound, normal, IUD in place. UPT from earlier today was negative. Will recheck her BP now (high earlier), await labs. If she isn't feeling better, discussed f/u with primary and trial of OCP's Repeat BP 118/68  Given patient's stress, #'s of counselors given.   Between both visits, over 25 minutes  was spent face to face with the patient, over 50% in counseling

## 2018-02-06 NOTE — Telephone Encounter (Signed)
Return call to patient. Requesting office visit  " to be check out."  Reports cramping for one week since last menses. Intermittent pain "near right ovary for a while now."  Light pink vaginal discharge. Menses 1 week ago. IUD in place and can feel strings.  Concerned about ovarian cyst.  Denies fever. Occasional nausea, no vomiting. Appointment scheduled for 1030 with Dr Oscar La.    Encounter closed.

## 2018-02-07 ENCOUNTER — Telehealth: Payer: Self-pay | Admitting: *Deleted

## 2018-02-07 LAB — CHLAMYDIA/GONOCOCCUS/TRICHOMONAS, NAA
Chlamydia by NAA: NEGATIVE
GONOCOCCUS BY NAA: NEGATIVE
Trich vag by NAA: NEGATIVE

## 2018-02-07 LAB — TSH: TSH: 1.11 u[IU]/mL (ref 0.450–4.500)

## 2018-02-07 LAB — HEP, RPR, HIV PANEL
HIV Screen 4th Generation wRfx: NONREACTIVE
Hepatitis B Surface Ag: NEGATIVE
RPR Ser Ql: NONREACTIVE

## 2018-02-07 LAB — CBC WITH DIFFERENTIAL/PLATELET
BASOS ABS: 0 10*3/uL (ref 0.0–0.2)
Basos: 0 %
EOS (ABSOLUTE): 0.1 10*3/uL (ref 0.0–0.4)
Eos: 1 %
HEMOGLOBIN: 13.3 g/dL (ref 11.1–15.9)
Hematocrit: 40 % (ref 34.0–46.6)
Immature Grans (Abs): 0 10*3/uL (ref 0.0–0.1)
Immature Granulocytes: 0 %
LYMPHS ABS: 1.5 10*3/uL (ref 0.7–3.1)
Lymphs: 27 %
MCH: 30.4 pg (ref 26.6–33.0)
MCHC: 33.3 g/dL (ref 31.5–35.7)
MCV: 91 fL (ref 79–97)
MONOCYTES: 8 %
Monocytes Absolute: 0.5 10*3/uL (ref 0.1–0.9)
NEUTROS ABS: 3.5 10*3/uL (ref 1.4–7.0)
Neutrophils: 64 %
Platelets: 232 10*3/uL (ref 150–450)
RBC: 4.38 x10E6/uL (ref 3.77–5.28)
RDW: 12.8 % (ref 12.3–15.4)
WBC: 5.5 10*3/uL (ref 3.4–10.8)

## 2018-02-07 LAB — URINALYSIS, MICROSCOPIC ONLY
Bacteria, UA: NONE SEEN
CASTS: NONE SEEN /LPF
RBC, UA: NONE SEEN /hpf (ref 0–2)

## 2018-02-07 LAB — URINE CULTURE: Organism ID, Bacteria: NO GROWTH

## 2018-02-07 NOTE — Telephone Encounter (Signed)
-----   Message from Romualdo Bolk, MD sent at 02/07/2018  8:07 AM EDT ----- Please inform the patient that she has crystals in her urine that can lead to kidney stones. There is no blood in her urine, which is typically seen with pain from kidney stones. Her pain is not c/w kidney stones. She should stay well hydrated to try and prevent the formation of kidney stones.  The rest of her blood work is normal. There is not a good explanation for her fatigue.  Cervical cultures, urine culture and HIV are still pending.

## 2018-02-07 NOTE — Telephone Encounter (Signed)
Message left to return call to Krystal Teachey at [phone].    

## 2018-02-08 NOTE — Telephone Encounter (Signed)
Message left to return call to Jazalynn Mireles at [phone].    

## 2018-02-09 NOTE — Telephone Encounter (Signed)
Patient returning [name]'s call for results.

## 2018-02-09 NOTE — Telephone Encounter (Signed)
Spoke with patient, advised of all results as seen below per Dr . Patient verbalizes understanding and is agreeable. Encounter closed.      Notes recorded by Romualdo Bolk, MD on 02/08/2018 at 10:27 AM EDT Urine culture and genprobe are normal. HIV is pending. See prior results note. I don't think the patient has been informed yet, but has been left a message already to call. Please inform of all results when she calls back.

## 2018-03-14 ENCOUNTER — Ambulatory Visit (INDEPENDENT_AMBULATORY_CARE_PROVIDER_SITE_OTHER): Payer: BLUE CROSS/BLUE SHIELD | Admitting: Nurse Practitioner

## 2018-03-14 ENCOUNTER — Encounter: Payer: Self-pay | Admitting: Nurse Practitioner

## 2018-03-14 VITALS — HR 80 | Temp 98.4°F | Ht 67.0 in | Wt 270.0 lb

## 2018-03-14 DIAGNOSIS — Z23 Encounter for immunization: Secondary | ICD-10-CM | POA: Diagnosis not present

## 2018-03-14 DIAGNOSIS — F331 Major depressive disorder, recurrent, moderate: Secondary | ICD-10-CM

## 2018-03-14 NOTE — Patient Instructions (Addendum)
Increase lexapro to 1tab once a day. Maintain trazodone at current dose.  On your days off, start walking 30-60mins a day.  Call 911 if suicidal thoughts become more pervasive.  You will be contacted to schedule appt with psychology or you may contact counseling centers on sheet provided.  Living With Depression Everyone experiences occasional disappointment, sadness, and loss in their lives. When you are feeling down, blue, or sad for at least 2 weeks in a row, it may mean that you have depression. Depression can affect your thoughts and feelings, relationships, daily activities, and physical health. It is caused by changes in the way your brain functions. If you receive a diagnosis of depression, your health care provider will tell you which type of depression you have and what treatment options are available to you. If you are living with depression, there are ways to help you recover from it and also ways to prevent it from coming back. How to cope with lifestyle changes Coping with stress Stress is your body's reaction to life changes and events, both good and bad. Stressful situations may include:  Getting married.  The death of a spouse.  Losing a job.  Retiring.  Having a baby.  Stress can last just a few hours or it can be ongoing. Stress can play a major role in depression, so it is important to learn both how to cope with stress and how to think about it differently. Talk with your health care provider or a counselor if you would like to learn more about stress reduction. He or she may suggest some stress reduction techniques, such as:  Music therapy. This can include creating music or listening to music. Choose music that you enjoy and that inspires you.  Mindfulness-based meditation. This kind of meditation can be done while sitting or walking. It involves being aware of your normal breaths, rather than trying to control your breathing.  Centering prayer. This is a kind  of meditation that involves focusing on a spiritual word or phrase. Choose a word, phrase, or sacred image that is meaningful to you and that brings you peace.  Deep breathing. To do this, expand your stomach and inhale slowly through your nose. Hold your breath for 3-5 seconds, then exhale slowly, allowing your stomach muscles to relax.  Muscle relaxation. This involves intentionally tensing muscles then relaxing them.  Choose a stress reduction technique that fits your lifestyle and personality. Stress reduction techniques take time and practice to develop. Set aside 5-15 minutes a day to do them. Therapists can offer training in these techniques. The training may be covered by some insurance plans. Other things you can do to manage stress include:  Keeping a stress diary. This can help you learn what triggers your stress and ways to control your response.  Understanding what your limits are and saying no to requests or events that lead to a schedule that is too full.  Thinking about how you respond to certain situations. You may not be able to control everything, but you can control how you react.  Adding humor to your life by watching funny films or TV shows.  Making time for activities that help you relax and not feeling guilty about spending your time this way.  Medicines Your health care provider may suggest certain medicines if he or she feels that they will help improve your condition. Avoid using alcohol and other substances that may prevent your medicines from working properly (may interact). It is also  important to:  Talk with your pharmacist or health care provider about all the medicines that you take, their possible side effects, and what medicines are safe to take together.  Make it your goal to take part in all treatment decisions (shared decision-making). This includes giving input on the side effects of medicines. It is best if shared decision-making with your health care  provider is part of your total treatment plan.  If your health care provider prescribes a medicine, you may not notice the full benefits of it for 4-8 weeks. Most people who are treated for depression need to be on medicine for at least 6-12 months after they feel better. If you are taking medicines as part of your treatment, do not stop taking medicines without first talking to your health care provider. You may need to have the medicine slowly decreased (tapered) over time to decrease the risk of harmful side effects. Relationships Your health care provider may suggest family therapy along with individual therapy and drug therapy. While there may not be family problems that are causing you to feel depressed, it is still important to make sure your family learns as much as they can about your mental health. Having your family's support can help make your treatment successful. How to recognize changes in your condition Everyone has a different response to treatment for depression. Recovery from major depression happens when you have not had signs of major depression for two months. This may mean that you will start to:  Have more interest in doing activities.  Feel less hopeless than you did 2 months ago.  Have more energy.  Overeat less often, or have better or improving appetite.  Have better concentration.  Your health care provider will work with you to decide the next steps in your recovery. It is also important to recognize when your condition is getting worse. Watch for these signs:  Having fatigue or low energy.  Eating too much or too little.  Sleeping too much or too little.  Feeling restless, agitated, or hopeless.  Having trouble concentrating or making decisions.  Having unexplained physical complaints.  Feeling irritable, angry, or aggressive.  Get help as soon as you or your family members notice these symptoms coming back. How to get support and help from  others How to talk with friends and family members about your condition Talking to friends and family members about your condition can provide you with one way to get support and guidance. Reach out to trusted friends or family members, explain your symptoms to them, and let them know that you are working with a health care provider to treat your depression. Financial resources Not all insurance plans cover mental health care, so it is important to check with your insurance carrier. If paying for co-pays or counseling services is a problem, search for a local or county mental health care center. They may be able to offer public mental health care services at low or no cost when you are not able to see a private health care provider. If you are taking medicine for depression, you may be able to get the generic form, which may be less expensive. Some makers of prescription medicines also offer help to patients who cannot afford the medicines they need. Follow these instructions at home:  Get the right amount and quality of sleep.  Cut down on using caffeine, tobacco, alcohol, and other potentially harmful substances.  Try to exercise, such as walking or lifting small weights.  Take over-the-counter and prescription medicines only as told by your health care provider.  Eat a healthy diet that includes plenty of vegetables, fruits, whole grains, low-fat dairy products, and lean protein. Do not eat a lot of foods that are high in solid fats, added sugars, or salt.  Keep all follow-up visits as told by your health care provider. This is important. Contact a health care provider if:  You stop taking your antidepressant medicines, and you have any of these symptoms: ? Nausea. ? Headache. ? Feeling lightheaded. ? Chills and body aches. ? Not being able to sleep (insomnia).  You or your friends and family think your depression is getting worse. Get help right away if:  You have thoughts of  hurting yourself or others. If you ever feel like you may hurt yourself or others, or have thoughts about taking your own life, get help right away. You can go to your nearest emergency department or call:  Your local emergency services (911 in the [place]  A suicide crisis helpline, such as the National Suicide Prevention Lifeline at 4328714759. This is open 24-hours a day.  Summary  If you are living with depression, there are ways to help you recover from it and also ways to prevent it from coming back.  Work with your health care team to create a management plan that includes counseling, stress management techniques, and healthy lifestyle habits. This information is not intended to replace advice given to you by your health care provider. Make sure you discuss any questions you have with your health care provider. Document Released: 04/25/2016 Document Revised: 04/25/2016 Document Reviewed: 04/25/2016 Elsevier Interactive Patient Education  [employer].

## 2018-03-14 NOTE — Progress Notes (Signed)
Subjective:  Patient ID: Carmen Wall, female    DOB: 01/31/1991  Age: 27 y.o. MRN: [id number]  CC: Follow-up (trazodone only help with sleeping but anxiety and mood is getting worse. flu shot. )  Depression       The patient presents with depression.  This is a chronic problem.  The current episode started more than 1 year ago.   The onset quality is gradual.   The problem occurs constantly.  The problem has been gradually worsening since onset.  Associated symptoms include decreased concentration, fatigue, insomnia, irritable, restlessness, decreased interest, body aches, myalgias, sad and suicidal ideas.     The symptoms are aggravated by work stress, family issues and social issues.  Past treatments include SSRIs - Selective serotonin reuptake inhibitors and other medications.  Compliance with treatment is good.  Risk factors include stress.   Past medical history includes anxiety and depression.     Pertinent negatives include no suicide attempts. positive suicide ideation, but no plans. States "I am too scared to do anything. I also think about my daughter." start lexapro 3days ago (1/2tab once a day) Trazodone at bedtime, helps to fall asleep but does not always stay asleep. Works 4days a week, 6:15am to 7:30pm. Bedtime is between 10 and 10:30am. Sometimes premature awakening, 2hrs prior to normal wake time. Does not exercise, has not made changes to diet. Parents help with child care. Denies any socialization with friends due to lack of interest  Reviewed past Medical, Social and Family history today.  Outpatient Medications Prior to Visit  Medication Sig Dispense Refill  . albuterol (PROVENTIL HFA;VENTOLIN HFA) 108 (90 Base) MCG/ACT inhaler Inhale 1 puff into the lungs every 4 (four) hours as needed for wheezing or shortness of breath (cough, shortness of breath or wheezing.). 1 Inhaler 0  . escitalopram (LEXAPRO) 10 MG tablet Take 1/2 a tablet a day for a week, if tolerating  then increase to one tablet a day. 30 tablet 1  . ibuprofen (ADVIL,MOTRIN) 600 MG tablet Take 1 tablet (600 mg total) by mouth every 8 (eight) hours as needed. 30 tablet 3  . polyethylene glycol (MIRALAX / GLYCOLAX) packet Take 17 g by mouth daily as needed. 14 each 0  . traZODone (DESYREL) 50 MG tablet Take 1 tablet (50 mg total) by mouth at bedtime. 90 tablet 1  . phentermine (ADIPEX-P) 37.5 MG tablet Take 1 tablet (37.5 mg total) by mouth daily before breakfast. (Patient not taking: Reported on 02/06/2018) 30 tablet 1   No facility-administered medications prior to visit.     ROS See HPI  Objective:  Pulse 80   Temp 98.4 F (36.9 C) (Oral)   Ht 5\' 7"  (1.702 m)   Wt 270 lb (122.5 kg)   SpO2 99%   BMI 42.29 kg/m   BP Readings from Last 3 Encounters:  02/06/18 118/68  10/20/17 110/70  09/05/17 (!) 138/92    Wt Readings from Last 3 Encounters:  03/14/18 270 lb (122.5 kg)  02/06/18 272 lb (123.4 kg)  10/20/17 267 lb (121.1 kg)    Physical Exam  Constitutional: She is irritable.  Psychiatric: Her mood appears anxious. Her affect is labile. She is slowed. Thought content is not delusional. Cognition and memory are normal. She exhibits a depressed mood. She expresses suicidal ideation. She expresses no suicidal plans and no homicidal plans.  Tearful during office visit.    Lab Results  Component Value Date   WBC 5.5 02/06/2018   HGB  13.3 02/06/2018   HCT 40.0 02/06/2018   PLT 232 02/06/2018   GLUCOSE 88 09/05/2017   CHOL 214 (H) 07/12/2016   TRIG 123.0 07/12/2016   HDL 50.20 07/12/2016   LDLCALC 140 (H) 07/12/2016   ALT 12 05/12/2017   AST 13 05/12/2017   NA 141 09/05/2017   K 4.4 09/05/2017   CL 103 09/05/2017   CREATININE 0.64 09/05/2017   BUN 12 09/05/2017   CO2 31 09/05/2017   TSH 1.110 02/06/2018   HGBA1C 4.9 09/05/2017    Ct Abdomen Pelvis W Contrast  Result Date: 05/12/2017 CLINICAL DATA:  Lower abdominal pain radiates to low back. Associated with  nausea diarrhea and chills. EXAM: CT ABDOMEN AND PELVIS WITH CONTRAST TECHNIQUE: Multidetector CT imaging of the abdomen and pelvis was performed using the standard protocol following bolus administration of intravenous contrast. CONTRAST:  ISOVUE-300 IOPAMIDOL (ISOVUE-300) INJECTION 61% COMPARISON:  07/25/2015 FINDINGS: Lower chest:  Unremarkable. Hepatobiliary: No focal abnormality within the liver parenchyma. There is no evidence for gallstones, gallbladder wall thickening, or pericholecystic fluid. No intrahepatic or extrahepatic biliary dilation. Pancreas: No focal mass lesion. No dilatation of the main duct. No intraparenchymal cyst. No peripancreatic edema. Spleen: 10 mm hypoattenuating subcapsular lesion in the posterior spleen and is likely a small cyst or pseudocyst. Adrenals/Urinary Tract: No adrenal nodule or mass. Kidneys are unremarkable. No evidence for hydroureter. The urinary bladder appears normal for the degree of distention. Stomach/Bowel: Stomach is nondistended. No gastric wall thickening. No evidence of outlet obstruction. Duodenum is normally positioned as is the ligament of Treitz. No small bowel wall thickening. No small bowel dilatation. The terminal ileum is normal. No gross colonic mass. No colonic wall thickening. No substantial diverticular change. Vascular/Lymphatic: There is no gastrohepatic or hepatoduodenal ligament lymphadenopathy. No intraperitoneal or retroperitoneal lymphadenopathy. No abdominal aortic aneurysm. No abdominal aortic atherosclerotic calcification. No pelvic sidewall lymphadenopathy. Reproductive: IUD is visualized in the uterus. There is no adnexal mass. Other: Trace free fluid is evident in the cul-de-sac. Musculoskeletal: Bone windows reveal no worrisome lytic or sclerotic osseous lesions. IMPRESSION: 1. Unremarkable exam with no acute findings in the abdomen or pelvis. Specifically, no findings to explain the patient's history of lower abdominal pain  radiating to the back. No GI tract abnormality to account for the nausea and diarrhea. Electronically Signed   By: Kennith Center M.D.   On: 05/12/2017 16:40    Assessment & Plan:   Carmen Wall was seen today for follow-up.  Diagnoses and all orders for this visit:  Moderate episode of recurrent major depressive disorder (HCC) -     Ambulatory referral to Psychology  Need for influenza vaccination -     Flu Vaccine QUAD 36+ mos IM   I have discontinued Carmen Wall's phentermine. I am also having her maintain her polyethylene glycol, ibuprofen, traZODone, albuterol, and escitalopram.  No orders of the defined types were placed in this encounter.   Follow-up: Return in about 2 weeks (around 03/28/2018) for depression and anxiety.  Alysia Penna, NP

## 2018-03-23 ENCOUNTER — Ambulatory Visit: Payer: BLUE CROSS/BLUE SHIELD | Admitting: Psychology

## 2018-03-23 DIAGNOSIS — F4323 Adjustment disorder with mixed anxiety and depressed mood: Secondary | ICD-10-CM

## 2018-03-28 ENCOUNTER — Ambulatory Visit: Payer: BLUE CROSS/BLUE SHIELD | Admitting: Nurse Practitioner

## 2018-04-06 ENCOUNTER — Ambulatory Visit: Payer: BLUE CROSS/BLUE SHIELD | Admitting: Psychology

## 2018-04-06 DIAGNOSIS — F4323 Adjustment disorder with mixed anxiety and depressed mood: Secondary | ICD-10-CM | POA: Diagnosis not present

## 2018-04-18 ENCOUNTER — Other Ambulatory Visit: Payer: Self-pay | Admitting: Nurse Practitioner

## 2018-04-18 DIAGNOSIS — J9801 Acute bronchospasm: Secondary | ICD-10-CM

## 2018-04-18 DIAGNOSIS — J302 Other seasonal allergic rhinitis: Secondary | ICD-10-CM

## 2018-04-25 ENCOUNTER — Ambulatory Visit: Payer: BLUE CROSS/BLUE SHIELD | Admitting: Psychology

## 2018-04-25 DIAGNOSIS — F4323 Adjustment disorder with mixed anxiety and depressed mood: Secondary | ICD-10-CM

## 2018-05-10 ENCOUNTER — Ambulatory Visit: Payer: BLUE CROSS/BLUE SHIELD | Admitting: Psychology

## 2018-05-10 DIAGNOSIS — F4323 Adjustment disorder with mixed anxiety and depressed mood: Secondary | ICD-10-CM | POA: Diagnosis not present

## 2018-05-15 ENCOUNTER — Encounter: Payer: Self-pay | Admitting: Nurse Practitioner

## 2018-05-15 ENCOUNTER — Ambulatory Visit: Payer: BLUE CROSS/BLUE SHIELD | Admitting: Nurse Practitioner

## 2018-05-15 VITALS — BP 114/70 | HR 75 | Temp 98.3°F | Ht 67.0 in | Wt 275.8 lb

## 2018-05-15 DIAGNOSIS — M5441 Lumbago with sciatica, right side: Secondary | ICD-10-CM

## 2018-05-15 DIAGNOSIS — L819 Disorder of pigmentation, unspecified: Secondary | ICD-10-CM

## 2018-05-15 DIAGNOSIS — F331 Major depressive disorder, recurrent, moderate: Secondary | ICD-10-CM

## 2018-05-15 DIAGNOSIS — F411 Generalized anxiety disorder: Secondary | ICD-10-CM

## 2018-05-15 DIAGNOSIS — G8929 Other chronic pain: Secondary | ICD-10-CM | POA: Insufficient documentation

## 2018-05-15 HISTORY — DX: Other chronic pain: G89.29

## 2018-05-15 MED ORDER — BUSPIRONE HCL 15 MG PO TABS: 15.0000 mg | ORAL_TABLET | Freq: Two times a day (BID) | ORAL | 1 refills | Status: DC | PRN

## 2018-05-15 MED ORDER — TIZANIDINE HCL 4 MG PO CAPS: 4.0000 mg | ORAL_CAPSULE | Freq: Every evening | ORAL | 0 refills | Status: DC | PRN

## 2018-05-15 MED ORDER — ESCITALOPRAM OXALATE 20 MG PO TABS: 20.0000 mg | ORAL_TABLET | Freq: Every day | ORAL | 3 refills | Status: DC

## 2018-05-15 MED ORDER — NAPROXEN 500 MG PO TABS: 500.0000 mg | ORAL_TABLET | Freq: Two times a day (BID) | ORAL | 1 refills | Status: DC | PRN

## 2018-05-15 NOTE — Patient Instructions (Signed)
Increase lexapro to 20mg  once a day. Use buspar as needed for panic attacks. Continue counseling sessions  You will be contacted to schedule appt with dermatology.  Need to resume regular exercise regimen. It should include stretching before and after. Use warm compress as needed. Will refer for PT if no improvement with home exercise.  Back Exercises The following exercises strengthen the muscles that help to support the back. They also help to keep the lower back flexible. Doing these exercises can help to prevent back pain or lessen existing pain. If you have back pain or discomfort, try doing these exercises 2-3 times each day or as told by your health care provider. When the pain goes away, do them once each day, but increase the number of times that you repeat the steps for each exercise (do more repetitions). If you do not have back pain or discomfort, do these exercises once each day or as told by your health care provider. Exercises Single Knee to Chest  Repeat these steps 3-5 times for each leg: 1. Lie on your back on a firm bed or the floor with your legs extended. 2. Bring one knee to your chest. Your other leg should stay extended and in contact with the floor. 3. Hold your knee in place by grabbing your knee or thigh. 4. Pull on your knee until you feel a gentle stretch in your lower back. 5. Hold the stretch for 10-30 seconds. 6. Slowly release and straighten your leg.  Pelvic Tilt  Repeat these steps 5-10 times: 1. Lie on your back on a firm bed or the floor with your legs extended. 2. Bend your knees so they are pointing toward the ceiling and your feet are flat on the floor. 3. Tighten your lower abdominal muscles to press your lower back against the floor. This motion will tilt your pelvis so your tailbone points up toward the ceiling instead of pointing to your feet or the floor. 4. With gentle tension and even breathing, hold this position for 5-10  seconds.  Cat-Cow  Repeat these steps until your lower back becomes more flexible: 1. Get into a hands-and-knees position on a firm surface. Keep your hands under your shoulders, and keep your knees under your hips. You may place padding under your knees for comfort. 2. Let your head hang down, and point your tailbone toward the floor so your lower back becomes rounded like the back of a cat. 3. Hold this position for 5 seconds. 4. Slowly lift your head and point your tailbone up toward the ceiling so your back forms a sagging arch like the back of a cow. 5. Hold this position for 5 seconds.  Press-Ups  Repeat these steps 5-10 times: 1. Lie on your abdomen (face-down) on the floor. 2. Place your palms near your head, about shoulder-width apart. 3. While you keep your back as relaxed as possible and keep your hips on the floor, slowly straighten your arms to raise the top half of your body and lift your shoulders. Do not use your back muscles to raise your upper torso. You may adjust the placement of your hands to make yourself more comfortable. 4. Hold this position for 5 seconds while you keep your back relaxed. 5. Slowly return to lying flat on the floor.  Bridges  Repeat these steps 10 times: 1. Lie on your back on a firm surface. 2. Bend your knees so they are pointing toward the ceiling and your feet are flat  on the floor. 3. Tighten your buttocks muscles and lift your buttocks off of the floor until your waist is at almost the same height as your knees. You should feel the muscles working in your buttocks and the back of your thighs. If you do not feel these muscles, slide your feet 1-2 inches farther away from your buttocks. 4. Hold this position for 3-5 seconds. 5. Slowly lower your hips to the starting position, and allow your buttocks muscles to relax completely.  If this exercise is too easy, try doing it with your arms crossed over your chest. Abdominal Crunches  Repeat  these steps 5-10 times: 1. Lie on your back on a firm bed or the floor with your legs extended. 2. Bend your knees so they are pointing toward the ceiling and your feet are flat on the floor. 3. Cross your arms over your chest. 4. Tip your chin slightly toward your chest without bending your neck. 5. Tighten your abdominal muscles and slowly raise your trunk (torso) high enough to lift your shoulder blades a tiny bit off of the floor. Avoid raising your torso higher than that, because it can put too much stress on your low back and it does not help to strengthen your abdominal muscles. 6. Slowly return to your starting position.  Back Lifts Repeat these steps 5-10 times: 1. Lie on your abdomen (face-down) with your arms at your sides, and rest your forehead on the floor. 2. Tighten the muscles in your legs and your buttocks. 3. Slowly lift your chest off of the floor while you keep your hips pressed to the floor. Keep the back of your head in line with the curve in your back. Your eyes should be looking at the floor. 4. Hold this position for 3-5 seconds. 5. Slowly return to your starting position.  Contact a health care provider if:  Your back pain or discomfort gets much worse when you do an exercise.  Your back pain or discomfort does not lessen within 2 hours after you exercise. If you have any of these problems, stop doing these exercises right away. Do not do them again unless your health care provider says that you can. Get help right away if:  You develop sudden, severe back pain. If this happens, stop doing the exercises right away. Do not do them again unless your health care provider says that you can. This information is not intended to replace advice given to you by your health care provider. Make sure you discuss any questions you have with your health care provider. Document Released: 06/30/2004 Document Revised: 09/30/2015 Document Reviewed: 07/17/2014 Elsevier Interactive  Patient Education  2017 [employer]

## 2018-05-15 NOTE — Progress Notes (Signed)
Subjective:  Patient ID: Carmen Wall [place], female    DOB: [birth year]  Age: 27 y.o. MRN: [id number]  CC: Medication Problem (lexapro is not helping as much,feel more depression,still get anxiety at times. ); Back Pain (back pain,sharp pain down legs when walking, ); and Rash (red spot on body,itchy/ going on for a while?)   Back Pain  This is a chronic problem. The current episode started more than 1 year ago. The problem occurs intermittently. The problem has been waxing and waning since onset. The pain is present in the lumbar spine. The quality of the pain is described as aching and cramping. The pain radiates to the right thigh. The pain is moderate. The symptoms are aggravated by bending, standing, twisting and stress. Pertinent negatives include no abdominal pain, bladder incontinence, bowel incontinence, dysuria, fever, numbness, paresis, paresthesias, pelvic pain, perianal numbness, tingling or weakness. Risk factors include obesity, lack of exercise, poor posture and sedentary lifestyle. She has tried NSAIDs for the symptoms. The treatment provided moderate relief.  Rash  This is a new problem. The current episode started more than 1 month ago. The problem has been waxing and waning since onset. The affected locations include the left lower leg, right lower leg, right shoulder and left shoulder. The rash is characterized by itchiness and pain. It is unknown if there was an exposure to a precipitant. Pertinent negatives include no fever. Past treatments include anti-itch cream. The treatment provided mild relief. There is no history of eczema or varicella.  no previous back injury.  Depression and Anxiety: waxing and weaning emotions, worse around beginning of menstrual cycle. lexapro was initiated by GYN 3months ago, some improvement with medication. Still using trazodone at bedtime. Reports panic attacks which interfere with sleep.(describes as palpitations, SOB, unable to leave her  home)  has Sessions with psychology every other week. Depression screen [facility] 2/9 05/15/2018 03/14/2018 07/19/2017  Decreased Interest 1 3 0  Down, Depressed, Hopeless 2 3 0  PHQ - 2 Score 3 6 0  Altered sleeping 3 2 -  Tired, decreased energy 3 3 -  Change in appetite 3 2 -  Feeling bad or failure about yourself  2 3 -  Trouble concentrating 2 1 -  Moving slowly or fidgety/restless 3 2 -  Suicidal thoughts 1 2 -  PHQ-9 Score 20 21 -  Difficult doing work/chores - - -   GAD 7 : Generalized Anxiety Score 05/15/2018 03/14/2018  Nervous, Anxious, on Edge 3 3  Control/stop worrying 3 3  Worry too much - different things 3 3  Trouble relaxing 2 3  Restless 2 1  Easily annoyed or irritable 3 3  Afraid - awful might happen 2 2  Total GAD 7 Score 18 18   Reviewed past Medical, Social and Family history today.  Outpatient Medications Prior to Visit  Medication Sig Dispense Refill  . albuterol (VENTOLIN HFA) 108 (90 Base) MCG/ACT inhaler Inhale 1 puff into the lungs every 6 (six) hours as needed for wheezing or shortness of breath. Need office visit for additional refills 6.7 g 1  . polyethylene glycol (MIRALAX / GLYCOLAX) packet Take 17 g by mouth daily as needed. 14 each 0  . traZODone (DESYREL) 50 MG tablet Take 1 tablet (50 mg total) by mouth at bedtime. 90 tablet 1  . escitalopram (LEXAPRO) 10 MG tablet Take 1/2 a tablet a day for a week, if tolerating then increase to one tablet a day. 30 tablet 1  .  ibuprofen (ADVIL,MOTRIN) 600 MG tablet Take 1 tablet (600 mg total) by mouth every 8 (eight) hours as needed. 30 tablet 3   No facility-administered medications prior to visit.     ROS See HPI  Objective:  BP 114/70   Pulse 75   Temp 98.3 F (36.8 C) (Oral)   Ht 5\' 7"  (1.702 Wall)   Wt 275 lb 12.8 oz (125.1 kg)   SpO2 97%   BMI 43.20 kg/Wall   BP Readings from Last 3 Encounters:  05/15/18 114/70  02/06/18 118/68  10/20/17 110/70    Wt Readings from Last 3 Encounters:    05/15/18 275 lb 12.8 oz (125.1 kg)  03/14/18 270 lb (122.5 kg)  02/06/18 272 lb (123.4 kg)    Physical Exam  Constitutional: She is oriented to person, place, and time. No distress.  Cardiovascular: Normal rate and intact distal pulses.  Pulmonary/Chest: Effort normal.  Abdominal: Soft. There is no tenderness.  Musculoskeletal: She exhibits tenderness. She exhibits no edema.       Right hip: Normal.       Left hip: Normal.       Lumbar back: She exhibits tenderness. She exhibits normal range of motion, no bony tenderness, no spasm and normal pulse.       Right upper leg: Normal.       Left upper leg: Normal.  Negative straight leg raise. Bilateral lumbar paraspinal muscle tenderness.   Neurological: She is alert and oriented to person, place, and time.  Skin: Rash noted. Rash is maculopapular.     Psychiatric: Her speech is normal. Judgment normal. Her mood appears anxious. She is agitated. Cognition and memory are normal. She expresses no suicidal plans and no homicidal plans.  Vitals reviewed.   Lab Results  Component Value Date   WBC 5.5 02/06/2018   HGB 13.3 02/06/2018   HCT 40.0 02/06/2018   PLT 232 02/06/2018   GLUCOSE 88 09/05/2017   CHOL 214 (H) 07/12/2016   TRIG 123.0 07/12/2016   HDL 50.20 07/12/2016   LDLCALC 140 (H) 07/12/2016   ALT 12 05/12/2017   AST 13 05/12/2017   NA 141 09/05/2017   K 4.4 09/05/2017   CL 103 09/05/2017   CREATININE 0.64 09/05/2017   BUN 12 09/05/2017   CO2 31 09/05/2017   TSH 1.110 02/06/2018   HGBA1C 4.9 09/05/2017    Ct Abdomen Pelvis W Contrast  Result Date: 05/12/2017 CLINICAL DATA:  Lower abdominal pain radiates to low back. Associated with nausea diarrhea and chills. EXAM: CT ABDOMEN AND PELVIS WITH CONTRAST TECHNIQUE: Multidetector CT imaging of the abdomen and pelvis was performed using the standard protocol following bolus administration of intravenous contrast. CONTRAST:  ISOVUE-300 IOPAMIDOL (ISOVUE-300)  INJECTION 61% COMPARISON:  07/25/2015 FINDINGS: Lower chest:  Unremarkable. Hepatobiliary: No focal abnormality within the liver parenchyma. There is no evidence for gallstones, gallbladder wall thickening, or pericholecystic fluid. No intrahepatic or extrahepatic biliary dilation. Pancreas: No focal mass lesion. No dilatation of the main duct. No intraparenchymal cyst. No peripancreatic edema. Spleen: 10 mm hypoattenuating subcapsular lesion in the posterior spleen and is likely a small cyst or pseudocyst. Adrenals/Urinary Tract: No adrenal nodule or mass. Kidneys are unremarkable. No evidence for hydroureter. The urinary bladder appears normal for the degree of distention. Stomach/Bowel: Stomach is nondistended. No gastric wall thickening. No evidence of outlet obstruction. Duodenum is normally positioned as is the ligament of Treitz. No small bowel wall thickening. No small bowel dilatation. The terminal ileum is normal.  No gross colonic mass. No colonic wall thickening. No substantial diverticular change. Vascular/Lymphatic: There is no gastrohepatic or hepatoduodenal ligament lymphadenopathy. No intraperitoneal or retroperitoneal lymphadenopathy. No abdominal aortic aneurysm. No abdominal aortic atherosclerotic calcification. No pelvic sidewall lymphadenopathy. Reproductive: IUD is visualized in the uterus. There is no adnexal mass. Other: Trace free fluid is evident in the cul-de-sac. Musculoskeletal: Bone windows reveal no worrisome lytic or sclerotic osseous lesions. IMPRESSION: 1. Unremarkable exam with no acute findings in the abdomen or pelvis. Specifically, no findings to explain the patient's history of lower abdominal pain radiating to the back. No GI tract abnormality to account for the nausea and diarrhea. Electronically Signed   By: Kennith Center Wall.D.   On: 05/12/2017 16:40    Assessment & Plan:  Advised to call 911 if suicide ideation worsen. Encourage to resume regular exercise.  Carmyn  was seen today for medication problem, back pain and rash.  Diagnoses and all orders for this visit:  Moderate episode of recurrent major depressive disorder (HCC) -     escitalopram (LEXAPRO) 20 MG tablet; Take 1 tablet (20 mg total) by mouth at bedtime. -     busPIRone (BUSPAR) 15 MG tablet; Take 1 tablet (15 mg total) by mouth 2 (two) times daily as needed.  GAD (generalized anxiety disorder) -     escitalopram (LEXAPRO) 20 MG tablet; Take 1 tablet (20 mg total) by mouth at bedtime. -     busPIRone (BUSPAR) 15 MG tablet; Take 1 tablet (15 mg total) by mouth 2 (two) times daily as needed.  Atypical pigmented skin lesion -     Ambulatory referral to Dermatology  Chronic right-sided low back pain with right-sided sciatica -     tiZANidine (ZANAFLEX) 4 MG capsule; Take 1 capsule (4 mg total) by mouth at bedtime as needed for muscle spasms. -     naproxen (NAPROSYN) 500 MG tablet; Take 1 tablet (500 mg total) by mouth 2 (two) times daily between meals as needed.   I have discontinued Artemisa Wall. [place]'s ibuprofen. I have also changed her escitalopram. Additionally, I am having her start on busPIRone, tiZANidine, and naproxen. Lastly, I am having her maintain her polyethylene glycol, traZODone, and albuterol.  Meds ordered this encounter  Medications  . escitalopram (LEXAPRO) 20 MG tablet    Sig: Take 1 tablet (20 mg total) by mouth at bedtime.    Dispense:  30 tablet    Refill:  3    Order Specific Question:   Supervising Provider    Answer:   MATTHEWS, CODY [[id number]]  . busPIRone (BUSPAR) 15 MG tablet    Sig: Take 1 tablet (15 mg total) by mouth 2 (two) times daily as needed.    Dispense:  30 tablet    Refill:  1    Order Specific Question:   Supervising Provider    Answer:   MATTHEWS, CODY [[id number]]  . tiZANidine (ZANAFLEX) 4 MG capsule    Sig: Take 1 capsule (4 mg total) by mouth at bedtime as needed for muscle spasms.    Dispense:  20 capsule    Refill:  0    Order Specific  Question:   Supervising Provider    Answer:   MATTHEWS, CODY [[id number]]  . naproxen (NAPROSYN) 500 MG tablet    Sig: Take 1 tablet (500 mg total) by mouth 2 (two) times daily between meals as needed.    Dispense:  30 tablet    Refill:  1    Order  Specific Question:   Supervising Provider    Answer:   Everrett CoombeMATTHEWS, CODY [[id number]]    Problem List Items Addressed This Visit      Nervous and Auditory   Chronic right-sided low back pain with right-sided sciatica   Relevant Medications   escitalopram (LEXAPRO) 20 MG tablet   busPIRone (BUSPAR) 15 MG tablet   tiZANidine (ZANAFLEX) 4 MG capsule   naproxen (NAPROSYN) 500 MG tablet     Other   Anxiety   Relevant Medications   escitalopram (LEXAPRO) 20 MG tablet   busPIRone (BUSPAR) 15 MG tablet   Moderate episode of recurrent major depressive disorder (HCC) - Primary   Relevant Medications   escitalopram (LEXAPRO) 20 MG tablet   busPIRone (BUSPAR) 15 MG tablet    Other Visit Diagnoses    Atypical pigmented skin lesion       Relevant Orders   Ambulatory referral to Dermatology       Follow-up: Return in about 2 weeks (around 05/29/2018) for depression and back pain (30mins).  Alysia Pennaharlotte [name], NP

## 2018-05-23 ENCOUNTER — Ambulatory Visit: Payer: BLUE CROSS/BLUE SHIELD | Admitting: Psychology

## 2018-05-23 DIAGNOSIS — F4323 Adjustment disorder with mixed anxiety and depressed mood: Secondary | ICD-10-CM | POA: Diagnosis not present

## 2018-06-01 ENCOUNTER — Encounter: Payer: Self-pay | Admitting: Nurse Practitioner

## 2018-06-01 ENCOUNTER — Ambulatory Visit: Payer: BLUE CROSS/BLUE SHIELD | Admitting: Nurse Practitioner

## 2018-06-01 VITALS — BP 122/78 | HR 79 | Temp 98.0°F | Ht 67.0 in | Wt 274.8 lb

## 2018-06-01 DIAGNOSIS — G8929 Other chronic pain: Secondary | ICD-10-CM | POA: Diagnosis not present

## 2018-06-01 DIAGNOSIS — F331 Major depressive disorder, recurrent, moderate: Secondary | ICD-10-CM | POA: Diagnosis not present

## 2018-06-01 DIAGNOSIS — M5441 Lumbago with sciatica, right side: Secondary | ICD-10-CM | POA: Diagnosis not present

## 2018-06-01 DIAGNOSIS — F411 Generalized anxiety disorder: Secondary | ICD-10-CM | POA: Diagnosis not present

## 2018-06-01 MED ORDER — TRAZODONE HCL 50 MG PO TABS: 50.0000 mg | ORAL_TABLET | Freq: Every day | ORAL | 1 refills | Status: DC

## 2018-06-01 MED ORDER — BUSPIRONE HCL 15 MG PO TABS: 15.0000 mg | ORAL_TABLET | Freq: Two times a day (BID) | ORAL | 3 refills | Status: DC

## 2018-06-01 NOTE — Assessment & Plan Note (Signed)
Normal CT and lumbar spine x-ray. Intermittent with prolonged standing and walking Worse of use of work boots without inserts. Improved with use of naproxen and zanaflex prn, and stretching exercise. Unable to afford PT at this time. Encourage weight loss through diet changes and exercise.  Advised to use naproxen at bedtime instead of zanaflex due to increased sedation in combination with trazodone

## 2018-06-01 NOTE — Assessment & Plan Note (Signed)
lexapro dose increased to 20mg  2weeks ago. Added buspar BID. Maintain trazodone. Continue psychotherapy every 2weeks.  She is to send mychart message if no improvement in 4-6weeks. Consider changing lexapro to wellbutrin?

## 2018-06-01 NOTE — Patient Instructions (Addendum)
Use zanaflex sparingly, take at least 2hrs apart from trazodone. Use naproxen 500mg  daily with supper, and during the day if needed.  Continue trazodone and lexapro as prescribed. Take buspar BID and not as needed.  Let me know if you change you mind about PT.  Send mychart message in 4weeks if no change in mood.

## 2018-06-01 NOTE — Progress Notes (Signed)
Subjective:  Patient ID: Carmen Wall, female    DOB: 06/19/1990  Age: 27 y.o. MRN: [id number]  CC: Follow-up (F/u of depression and back pain, back pain comes and goes no changes from last visit--med help sleep at night but feel stiff in the AM,depression she states no changes from last visit)  HPI Anxiety and Depression: No change in mood. No adverse effects with buspar, used once with delayed response  Chronic Back pain: Intermittent with prolonged standing and walking Worse of use of work boots without inserts. Improved with use of naproxen and zanaflex prn, and stretching exercise. Unable to afford PT at this time.  Reviewed past Medical, Social and Family history today.  Outpatient Medications Prior to Visit  Medication Sig Dispense Refill  . albuterol (VENTOLIN HFA) 108 (90 Base) MCG/ACT inhaler Inhale 1 puff into the lungs every 6 (six) hours as needed for wheezing or shortness of breath. Need office visit for additional refills 6.7 g 1  . escitalopram (LEXAPRO) 20 MG tablet Take 1 tablet (20 mg total) by mouth at bedtime. 30 tablet 3  . naproxen (NAPROSYN) 500 MG tablet Take 1 tablet (500 mg total) by mouth 2 (two) times daily between meals as needed. 30 tablet 1  . polyethylene glycol (MIRALAX / GLYCOLAX) packet Take 17 g by mouth daily as needed. 14 each 0  . tiZANidine (ZANAFLEX) 4 MG capsule Take 1 capsule (4 mg total) by mouth at bedtime as needed for muscle spasms. 20 capsule 0  . busPIRone (BUSPAR) 15 MG tablet Take 1 tablet (15 mg total) by mouth 2 (two) times daily as needed. 30 tablet 1  . traZODone (DESYREL) 50 MG tablet Take 1 tablet (50 mg total) by mouth at bedtime. 90 tablet 1   No facility-administered medications prior to visit.     ROS See HPI  Objective:  BP 122/78   Pulse 79   Temp 98 F (36.7 C) (Oral)   Ht 5\' 7"  (1.702 m)   Wt 274 lb 12.8 oz (124.6 kg)   SpO2 98%   BMI 43.04 kg/m   BP Readings from Last 3 Encounters:  06/01/18  122/78  05/15/18 114/70  02/06/18 118/68    Wt Readings from Last 3 Encounters:  06/01/18 274 lb 12.8 oz (124.6 kg)  05/15/18 275 lb 12.8 oz (125.1 kg)  03/14/18 270 lb (122.5 kg)   Physical Exam Psychiatric:        Attention and Perception: Attention normal.        Mood and Affect: Mood is anxious.        Speech: Speech normal.        Behavior: Behavior is cooperative.        Thought Content: Thought content is not delusional. Thought content does not include homicidal or suicidal ideation.        Cognition and Memory: Cognition and memory normal.    Lab Results  Component Value Date   WBC 5.5 02/06/2018   HGB 13.3 02/06/2018   HCT 40.0 02/06/2018   PLT 232 02/06/2018   GLUCOSE 88 09/05/2017   CHOL 214 (H) 07/12/2016   TRIG 123.0 07/12/2016   HDL 50.20 07/12/2016   LDLCALC 140 (H) 07/12/2016   ALT 12 05/12/2017   AST 13 05/12/2017   NA 141 09/05/2017   K 4.4 09/05/2017   CL 103 09/05/2017   CREATININE 0.64 09/05/2017   BUN 12 09/05/2017   CO2 31 09/05/2017   TSH 1.110 02/06/2018   HGBA1C  4.9 09/05/2017    Ct Abdomen Pelvis W Contrast  Result Date: 05/12/2017 CLINICAL DATA:  Lower abdominal pain radiates to low back. Associated with nausea diarrhea and chills. EXAM: CT ABDOMEN AND PELVIS WITH CONTRAST TECHNIQUE: Multidetector CT imaging of the abdomen and pelvis was performed using the standard protocol following bolus administration of intravenous contrast. CONTRAST:  100mL ISOVUE-300 IOPAMIDOL (ISOVUE-300) INJECTION 61% COMPARISON:  07/25/2015 FINDINGS: Lower chest:  Unremarkable. Hepatobiliary: No focal abnormality within the liver parenchyma. There is no evidence for gallstones, gallbladder wall thickening, or pericholecystic fluid. No intrahepatic or extrahepatic biliary dilation. Pancreas: No focal mass lesion. No dilatation of the main duct. No intraparenchymal cyst. No peripancreatic edema. Spleen: 10 mm hypoattenuating subcapsular lesion in the posterior spleen  and is likely a small cyst or pseudocyst. Adrenals/Urinary Tract: No adrenal nodule or mass. Kidneys are unremarkable. No evidence for hydroureter. The urinary bladder appears normal for the degree of distention. Stomach/Bowel: Stomach is nondistended. No gastric wall thickening. No evidence of outlet obstruction. Duodenum is normally positioned as is the ligament of Treitz. No small bowel wall thickening. No small bowel dilatation. The terminal ileum is normal. No gross colonic mass. No colonic wall thickening. No substantial diverticular change. Vascular/Lymphatic: There is no gastrohepatic or hepatoduodenal ligament lymphadenopathy. No intraperitoneal or retroperitoneal lymphadenopathy. No abdominal aortic aneurysm. No abdominal aortic atherosclerotic calcification. No pelvic sidewall lymphadenopathy. Reproductive: IUD is visualized in the uterus. There is no adnexal mass. Other: Trace free fluid is evident in the cul-de-sac. Musculoskeletal: Bone windows reveal no worrisome lytic or sclerotic osseous lesions. IMPRESSION: 1. Unremarkable exam with no acute findings in the abdomen or pelvis. Specifically, no findings to explain the patient's history of lower abdominal pain radiating to the back. No GI tract abnormality to account for the nausea and diarrhea. Electronically Signed   By: Kennith CenterEric  Mansell M.D.   On: 05/12/2017 16:40    Assessment & Plan:   Carmen Wall was seen today for follow-up.  Diagnoses and all orders for this visit:  Moderate episode of recurrent major depressive disorder (HCC) -     busPIRone (BUSPAR) 15 MG tablet; Take 1 tablet (15 mg total) by mouth 2 (two) times daily. -     traZODone (DESYREL) 50 MG tablet; Take 1 tablet (50 mg total) by mouth at bedtime.  GAD (generalized anxiety disorder) -     busPIRone (BUSPAR) 15 MG tablet; Take 1 tablet (15 mg total) by mouth 2 (two) times daily.  Chronic right-sided low back pain with right-sided sciatica   I have changed Carmen Wall's busPIRone. I am also having her maintain her polyethylene glycol, albuterol, escitalopram, tiZANidine, naproxen, and traZODone.  Meds ordered this encounter  Medications  . busPIRone (BUSPAR) 15 MG tablet    Sig: Take 1 tablet (15 mg total) by mouth 2 (two) times daily.    Dispense:  30 tablet    Refill:  3    Order Specific Question:   Supervising Provider    Answer:   MATTHEWS, CODY [[id number]]  . traZODone (DESYREL) 50 MG tablet    Sig: Take 1 tablet (50 mg total) by mouth at bedtime.    Dispense:  90 tablet    Refill:  1    Order Specific Question:   Supervising Provider    Answer:   MATTHEWS, CODY [[id number]]    Problem List Items Addressed This Visit      Nervous and Auditory   Chronic right-sided low back pain with right-sided sciatica  Normal CT and lumbar spine x-ray. Intermittent with prolonged standing and walking Worse of use of work boots without inserts. Improved with use of naproxen and zanaflex prn, and stretching exercise. Unable to afford PT at this time. Encourage weight loss through diet changes and exercise.  Advised to use naproxen at bedtime instead of zanaflex due to increased sedation in combination with trazodone      Relevant Medications   busPIRone (BUSPAR) 15 MG tablet   traZODone (DESYREL) 50 MG tablet     Other   Moderate episode of recurrent major depressive disorder (HCC) - Primary    lexapro dose increased to 20mg  2weeks ago. Added buspar BID. Maintain trazodone. Continue psychotherapy every 2weeks.  She is to send mychart message if no improvement in 4-6weeks. Consider changing lexapro to wellbutrin?        Relevant Medications   busPIRone (BUSPAR) 15 MG tablet   traZODone (DESYREL) 50 MG tablet    Other Visit Diagnoses    GAD (generalized anxiety disorder)       Relevant Medications   busPIRone (BUSPAR) 15 MG tablet   traZODone (DESYREL) 50 MG tablet       Follow-up: Return in about 3 months (around 08/31/2018) for  CPE (fasting. needs PHQ form completed).  Alysia Penna, NP

## 2018-06-07 ENCOUNTER — Ambulatory Visit: Payer: BLUE CROSS/BLUE SHIELD | Admitting: Psychology

## 2018-06-07 DIAGNOSIS — F4323 Adjustment disorder with mixed anxiety and depressed mood: Secondary | ICD-10-CM

## 2018-06-10 ENCOUNTER — Other Ambulatory Visit: Payer: Self-pay | Admitting: Nurse Practitioner

## 2018-06-10 DIAGNOSIS — M5441 Lumbago with sciatica, right side: Principal | ICD-10-CM

## 2018-06-10 DIAGNOSIS — G8929 Other chronic pain: Secondary | ICD-10-CM

## 2018-06-20 ENCOUNTER — Other Ambulatory Visit: Payer: Self-pay | Admitting: Nurse Practitioner

## 2018-06-20 DIAGNOSIS — M5441 Lumbago with sciatica, right side: Principal | ICD-10-CM

## 2018-06-20 DIAGNOSIS — G8929 Other chronic pain: Secondary | ICD-10-CM

## 2018-06-20 NOTE — Telephone Encounter (Signed)
Please advise, last rx sent 06/11/2018 for 10 tablets.

## 2018-06-21 ENCOUNTER — Ambulatory Visit: Payer: BLUE CROSS/BLUE SHIELD | Admitting: Psychology

## 2018-06-26 ENCOUNTER — Ambulatory Visit: Payer: BLUE CROSS/BLUE SHIELD | Admitting: Psychology

## 2018-06-26 DIAGNOSIS — F4323 Adjustment disorder with mixed anxiety and depressed mood: Secondary | ICD-10-CM | POA: Diagnosis not present

## 2018-06-28 ENCOUNTER — Telehealth: Payer: Self-pay | Admitting: Nurse Practitioner

## 2018-06-28 DIAGNOSIS — G8929 Other chronic pain: Secondary | ICD-10-CM

## 2018-06-28 DIAGNOSIS — F331 Major depressive disorder, recurrent, moderate: Secondary | ICD-10-CM

## 2018-06-28 DIAGNOSIS — M5441 Lumbago with sciatica, right side: Secondary | ICD-10-CM

## 2018-06-28 DIAGNOSIS — F411 Generalized anxiety disorder: Secondary | ICD-10-CM

## 2018-06-28 MED ORDER — NAPROXEN 500 MG PO TABS: 500.0000 mg | ORAL_TABLET | Freq: Two times a day (BID) | ORAL | 2 refills | Status: DC | PRN

## 2018-06-28 MED ORDER — BUSPIRONE HCL 15 MG PO TABS: 15.0000 mg | ORAL_TABLET | Freq: Two times a day (BID) | ORAL | 0 refills | Status: DC

## 2018-06-28 NOTE — Telephone Encounter (Signed)
Received refill request from [facility] on [address] for Buspirone 15 mg and naproxen 500 mg. Last ov was 06/01/2018 and scheduled f/u on 08/2018. Please advise ok to send both in?   We sent in last refill for Buspirone 30 with 3 refills but the pt is taking twice daily.   Naproxen last sent was 06/01/18 30 with 1 refill.

## 2018-07-01 ENCOUNTER — Other Ambulatory Visit: Payer: Self-pay | Admitting: Nurse Practitioner

## 2018-07-01 DIAGNOSIS — G8929 Other chronic pain: Secondary | ICD-10-CM

## 2018-07-01 DIAGNOSIS — M5441 Lumbago with sciatica, right side: Principal | ICD-10-CM

## 2018-07-10 ENCOUNTER — Ambulatory Visit: Payer: BLUE CROSS/BLUE SHIELD | Admitting: Psychology

## 2018-07-18 ENCOUNTER — Ambulatory Visit (INDEPENDENT_AMBULATORY_CARE_PROVIDER_SITE_OTHER): Payer: BLUE CROSS/BLUE SHIELD | Admitting: Psychology

## 2018-07-18 DIAGNOSIS — F4323 Adjustment disorder with mixed anxiety and depressed mood: Secondary | ICD-10-CM

## 2018-07-23 ENCOUNTER — Ambulatory Visit (INDEPENDENT_AMBULATORY_CARE_PROVIDER_SITE_OTHER): Payer: BLUE CROSS/BLUE SHIELD

## 2018-07-23 ENCOUNTER — Ambulatory Visit: Payer: BLUE CROSS/BLUE SHIELD | Admitting: Family Medicine

## 2018-07-23 ENCOUNTER — Encounter: Payer: Self-pay | Admitting: Family Medicine

## 2018-07-23 VITALS — BP 124/82 | HR 87 | Temp 98.4°F | Ht 67.0 in | Wt 282.6 lb

## 2018-07-23 DIAGNOSIS — M25561 Pain in right knee: Secondary | ICD-10-CM

## 2018-07-23 HISTORY — DX: Pain in right knee: M25.561

## 2018-07-23 MED ORDER — DICLOFENAC SODIUM 2 % TD SOLN: 1.0000 "application " | Freq: Two times a day (BID) | TRANSDERMAL | 3 refills | Status: DC

## 2018-07-23 NOTE — Progress Notes (Signed)
Carmen Wall [place] - 27 y.o. female MRN [id number]  Date of birth: Oct 11, 1990  SUBJECTIVE:  Including CC & ROS.  Chief Complaint  Patient presents with  . Pain    Right knee pain, crunching/ going on for year/ 2008 meniscus tear surgery/ last 3 months worse-- cortisone shot year ago    Carmen Wall [place] is a 28 y.o. female that is is presenting with acute on chronic right knee pain.  The pain is anterior nature.  She denies any swelling.  She does have pain with going up and down stairs.  The pain is to the anterior aspect of the knee.  It seems to be worse after she is on it for a period of time.  Has tried over-the-counter remedies with no improvement.  Pain is throbbing.  She feels like it may give out at times.  Denies any inciting event.  Pain is intermittent.  Can be moderate to severe.  It is localized to the knee.  Had a meniscal tear and repair in 2008 and received a steroid injection last year in the same knee.   Independent review of the right knee x-ray from 09/15/2016 shows A normal exam.  Review of Systems  Constitutional: Negative for fever.  HENT: Negative for congestion.   Respiratory: Negative for cough.   Cardiovascular: Negative for chest pain.  Gastrointestinal: Negative for abdominal pain.  Musculoskeletal: Positive for gait problem.  Skin: Negative for color change.  Neurological: Negative for weakness.  Hematological: Negative for adenopathy.  Psychiatric/Behavioral: Negative for agitation.    HISTORY: Past Medical, Surgical, Social, and Family History Reviewed & Updated per EMR.   Pertinent Historical Findings include:  Past Medical History:  Diagnosis Date  . Allergy   . Anxiety   . Arthritis   . Migraine without aura     Past Surgical History:  Procedure Laterality Date  . INTRAUTERINE DEVICE (IUD) INSERTION  04/2017   Mirena   . KNEE ARTHROSCOPY Right     No Known Allergies  Family History  Problem Relation Age of Onset  . Diabetes  Father   . Arthritis Father   . Depression Father   . Mental illness Father        PTSD, anxiety  . Arthritis Mother        osteoartthritis  . Fibroids Mother   . Ovarian cysts Mother   . Kidney disease Maternal Grandmother   . Arthritis Maternal Grandmother   . Prostate cancer Maternal Grandfather   . Arthritis Maternal Grandfather   . Arthritis Paternal Grandmother   . Diabetes Paternal Grandfather   . Mental illness Paternal Grandfather   . Arthritis Paternal Grandfather      Social History   Socioeconomic History  . Marital status: Legally Separated    Spouse name: Not on file  . Number of children: Not on file  . Years of education: Not on file  . Highest education level: Not on file  Occupational History  . Not on file  Social Needs  . Financial resource strain: Not on file  . Food insecurity:    Worry: Not on file    Inability: Not on file  . Transportation needs:    Medical: Not on file    Non-medical: Not on file  Tobacco Use  . Smoking status: Never Smoker  . Smokeless tobacco: Never Used  Substance and Sexual Activity  . Alcohol use: No    Alcohol/week: 0.0 standard drinks  . Drug use: No  .  Sexual activity: Yes    Partners: Male    Birth control/protection: I.U.D.  Lifestyle  . Physical activity:    Days per week: Not on file    Minutes per session: Not on file  . Stress: Not on file  Relationships  . Social connections:    Talks on phone: Not on file    Gets together: Not on file    Attends religious service: Not on file    Active member of club or organization: Not on file    Attends meetings of clubs or organizations: Not on file    Relationship status: Not on file  . Intimate partner violence:    Fear of current or ex partner: Not on file    Emotionally abused: Not on file    Physically abused: Not on file    Forced sexual activity: Not on file  Other Topics Concern  . Not on file  Social History Narrative  . Not on file      PHYSICAL EXAM:  VS: BP 124/82   Pulse 87   Temp 98.4 F (36.9 C) (Oral)   Ht 5\' 7"  (1.702 Wall)   Wt 282 lb 9.6 oz (128.2 kg)   SpO2 98%   BMI 44.26 kg/Wall  Physical Exam Gen: NAD, alert, cooperative with exam, well-appearing ENT: normal lips, normal nasal mucosa,  Eye: normal EOM, normal conjunctiva and lids CV:  no edema, +2 pedal pulses   Resp: no accessory muscle use, non-labored,  Skin: no rashes, no areas of induration  Neuro: normal tone, normal sensation to touch Psych:  normal insight, alert and oriented MSK:  Right knee:  No effusion  Normal ROm  Normal strength to resistance  Negative Murray's test. Negative Thessaly test. Neurovascular intact  Limited ultrasound: Right knee:  No significant effusion of the suprapatellar pouch. Normal appearing quadricep and patellar tendon. Normal-appearing medial joint line and medial meniscus. Anterior horn of lateral meniscus appears to have a horizontal hypoechoic region to suggest a possible tear.  There is a small effusion proximal to this area.  Normal-appearing lateral joint space.  Summary: Findings possible for anterior horn of lateral meniscus horizontal tear.  Ultrasound and interpretation by Clare Gandy, MD      ASSESSMENT & PLAN:   Acute pain of right knee Pain seems to be related to patellofemoral syndrome.  She does have history of meniscal tear in the same knee but clinical testing was not revealing for that as the contributing cause. -Counseled on home exercise therapy and supportive care. -Pennsaid. -If no improvement consider imaging and physical therapy.

## 2018-07-23 NOTE — Assessment & Plan Note (Signed)
Pain seems to be related to patellofemoral syndrome.  She does have history of meniscal tear in the same knee but clinical testing was not revealing for that as the contributing cause. -Counseled on home exercise therapy and supportive care. -Pennsaid. -If no improvement consider imaging and physical therapy.

## 2018-07-23 NOTE — Patient Instructions (Signed)
Nice to meet you  Please try the exercises  Please try the rub on medicine  Please try ice on the knee  Please see me back in 4-5 weeks if no better.

## 2018-08-07 ENCOUNTER — Ambulatory Visit: Payer: BLUE CROSS/BLUE SHIELD | Admitting: Psychology

## 2018-08-07 DIAGNOSIS — F4323 Adjustment disorder with mixed anxiety and depressed mood: Secondary | ICD-10-CM | POA: Diagnosis not present

## 2018-08-17 ENCOUNTER — Encounter: Payer: Self-pay | Admitting: Family Medicine

## 2018-08-17 ENCOUNTER — Other Ambulatory Visit: Payer: Self-pay

## 2018-08-17 ENCOUNTER — Ambulatory Visit: Payer: BLUE CROSS/BLUE SHIELD | Admitting: Family Medicine

## 2018-08-17 VITALS — BP 106/78 | HR 98 | Temp 99.0°F | Ht 67.0 in | Wt 278.8 lb

## 2018-08-17 DIAGNOSIS — J069 Acute upper respiratory infection, unspecified: Secondary | ICD-10-CM | POA: Diagnosis not present

## 2018-08-17 DIAGNOSIS — R6889 Other general symptoms and signs: Secondary | ICD-10-CM

## 2018-08-17 LAB — POCT INFLUENZA A/B
Influenza A, POC: NEGATIVE
Influenza B, POC: NEGATIVE

## 2018-08-17 NOTE — Assessment & Plan Note (Signed)
  Symptomatic therapy suggested: push fluids, rest, gargle warm salt water, use vaporizer or mist prn and return office visit prn if symptoms persist or worsen. Lack of antibiotic effectiveness discussed with her. Call or return to clinic prn if these symptoms worsen or fail to improve as anticipated.  

## 2018-08-17 NOTE — Patient Instructions (Signed)

## 2018-08-17 NOTE — Progress Notes (Signed)
Carmen Wall [place] - 28 y.o. female MRN [id number]  Date of birth: [birth year]  Subjective Chief Complaint  Patient presents with  . URI    allergies/sinus, then achey feeling today, clammy/sweaty feeling.     HPI  Carmen Wall [place] is a 28 y.o. female who complains of coryza, congestion, nasal blockage and myalgias for 2 days. She denies a history of anorexia, chest pain, nausea, shortness of breath, vomiting, wheezing and sputum production and denies a history of asthma. Patient does not smoke cigarettes.   ROS:  A comprehensive ROS was completed and negative except as noted per HPI   No Known Allergies  Past Medical History:  Diagnosis Date  . Allergy   . Anxiety   . Arthritis   . Migraine without aura     Past Surgical History:  Procedure Laterality Date  . INTRAUTERINE DEVICE (IUD) INSERTION  04/2017   Mirena   . KNEE ARTHROSCOPY Right     Social History   Socioeconomic History  . Marital status: Legally Separated    Spouse name: Not on file  . Number of children: Not on file  . Years of education: Not on file  . Highest education level: Not on file  Occupational History  . Not on file  Social Needs  . Financial resource strain: Not on file  . Food insecurity:    Worry: Not on file    Inability: Not on file  . Transportation needs:    Medical: Not on file    Non-medical: Not on file  Tobacco Use  . Smoking status: Never Smoker  . Smokeless tobacco: Never Used  Substance and Sexual Activity  . Alcohol use: No    Alcohol/week: 0.0 standard drinks  . Drug use: No  . Sexual activity: Yes    Partners: Male    Birth control/protection: I.U.D.  Lifestyle  . Physical activity:    Days per week: Not on file    Minutes per session: Not on file  . Stress: Not on file  Relationships  . Social connections:    Talks on phone: Not on file    Gets together: Not on file    Attends religious service: Not on file    Active member of club or organization:  Not on file    Attends meetings of clubs or organizations: Not on file    Relationship status: Not on file  Other Topics Concern  . Not on file  Social History Narrative  . Not on file    Family History  Problem Relation Age of Onset  . Diabetes Father   . Arthritis Father   . Depression Father   . Mental illness Father        PTSD, anxiety  . Arthritis Mother        osteoartthritis  . Fibroids Mother   . Ovarian cysts Mother   . Kidney disease Maternal Grandmother   . Arthritis Maternal Grandmother   . Prostate cancer Maternal Grandfather   . Arthritis Maternal Grandfather   . Arthritis Paternal Grandmother   . Diabetes Paternal Grandfather   . Mental illness Paternal Grandfather   . Arthritis Paternal Grandfather     Health Maintenance  Topic Date Due  . PAP-Cervical Cytology Screening  09/01/2019  . PAP SMEAR-Modifier  09/01/2019  . TETANUS/TDAP  01/04/2026  . INFLUENZA VACCINE  Completed  . HIV Screening  Completed    ----------------------------------------------------------------------------------------------------------------------------------------------------------------------------------------------------------------- Physical Exam BP 106/78   Pulse 98   Temp 99  F (37.2 C) (Oral)   Ht 5\' 7"  (1.702 m)   Wt 278 lb 12.8 oz (126.5 kg)   SpO2 99%   BMI 43.67 kg/m   Physical Exam Constitutional:      Appearance: Normal appearance.  HENT:     Head: Normocephalic and atraumatic.     Right Ear: Tympanic membrane normal.     Left Ear: Tympanic membrane normal.     Mouth/Throat:     Mouth: Mucous membranes are moist.  Eyes:     General: No scleral icterus. Neck:     Musculoskeletal: Neck supple.  Cardiovascular:     Rate and Rhythm: Normal rate and regular rhythm.  Pulmonary:     Effort: Pulmonary effort is normal.     Breath sounds: Normal breath sounds.  Skin:    General: Skin is warm and dry.  Neurological:     General: No focal deficit  present.     Mental Status: She is alert.  Psychiatric:        Mood and Affect: Mood normal.        Behavior: Behavior normal.     ------------------------------------------------------------------------------------------------------------------------------------------------------------------------------------------------------------------- Assessment and Plan  Viral URI Symptomatic therapy suggested: push fluids, rest, gargle warm salt water, use vaporizer or mist prn and return office visit prn if symptoms persist or worsen. Lack of antibiotic effectiveness discussed with her. Call or return to clinic prn if these symptoms worsen or fail to improve as anticipated.

## 2018-08-24 ENCOUNTER — Encounter: Payer: Self-pay | Admitting: Nurse Practitioner

## 2018-08-24 ENCOUNTER — Other Ambulatory Visit: Payer: Self-pay

## 2018-08-24 ENCOUNTER — Ambulatory Visit: Payer: BLUE CROSS/BLUE SHIELD | Admitting: Nurse Practitioner

## 2018-08-24 VITALS — BP 110/80 | HR 90 | Temp 99.0°F | Ht 67.0 in | Wt 279.6 lb

## 2018-08-24 DIAGNOSIS — F411 Generalized anxiety disorder: Secondary | ICD-10-CM

## 2018-08-24 DIAGNOSIS — Z0001 Encounter for general adult medical examination with abnormal findings: Secondary | ICD-10-CM

## 2018-08-24 DIAGNOSIS — F331 Major depressive disorder, recurrent, moderate: Secondary | ICD-10-CM

## 2018-08-24 DIAGNOSIS — Z0000 Encounter for general adult medical examination without abnormal findings: Secondary | ICD-10-CM | POA: Diagnosis not present

## 2018-08-24 DIAGNOSIS — Z6841 Body Mass Index (BMI) 40.0 and over, adult: Secondary | ICD-10-CM | POA: Insufficient documentation

## 2018-08-24 DIAGNOSIS — E669 Obesity, unspecified: Secondary | ICD-10-CM | POA: Insufficient documentation

## 2018-08-24 HISTORY — DX: Generalized anxiety disorder: F41.1

## 2018-08-24 LAB — COMPREHENSIVE METABOLIC PANEL
ALT: 19 U/L (ref 0–35)
AST: 17 U/L (ref 0–37)
Albumin: 4 g/dL (ref 3.5–5.2)
Alkaline Phosphatase: 68 U/L (ref 39–117)
BUN: 9 mg/dL (ref 6–23)
CALCIUM: 8.8 mg/dL (ref 8.4–10.5)
CO2: 26 mEq/L (ref 19–32)
Chloride: 106 mEq/L (ref 96–112)
Creatinine, Ser: 0.73 mg/dL (ref 0.40–1.20)
GFR: 93.87 mL/min (ref >60.00)
Glucose, Bld: 85 mg/dL (ref 70–99)
Potassium: 3.9 mEq/L (ref 3.5–5.1)
Sodium: 140 mEq/L (ref 135–145)
Total Bilirubin: 0.5 mg/dL (ref 0.2–1.2)
Total Protein: 7 g/dL (ref 6.0–8.3)

## 2018-08-24 LAB — LIPID PANEL
Cholesterol: 163 mg/dL (ref 0–200)
HDL: 36.5 mg/dL — ABNORMAL LOW (ref >39.00)
LDL Cholesterol: 106 mg/dL — ABNORMAL HIGH (ref 0–99)
NonHDL: 126.91
TRIGLYCERIDES: 105 mg/dL (ref 0.0–149.0)
Total CHOL/HDL Ratio: 4
VLDL: 21 mg/dL (ref 0.0–40.0)

## 2018-08-24 LAB — TSH: TSH: 1.41 u[IU]/mL (ref 0.35–4.50)

## 2018-08-24 MED ORDER — ESCITALOPRAM OXALATE 20 MG PO TABS: 20.0000 mg | ORAL_TABLET | Freq: Every day | ORAL | 3 refills | Status: DC

## 2018-08-24 MED ORDER — BUSPIRONE HCL 15 MG PO TABS: 15.0000 mg | ORAL_TABLET | Freq: Two times a day (BID) | ORAL | 3 refills | Status: DC

## 2018-08-24 NOTE — Progress Notes (Signed)
Subjective:    Patient ID: Carmen Wall [place], female    DOB: [birth year], 28 y.o.   MRN: [id number]  Patient presents today for complete physical and eval of chronic conditions  HPI  denies any acute complains.  Sexual History (orientation,birth control, marital status, STD):not sexually active at this time, IUD present, up to date with PAP.  Depression/Suicide: improved with lexapro and buspar.  Depression screen [facility] 2/9 08/24/2018 05/15/2018 03/14/2018 07/19/2017 07/12/2016 06/08/2015 09/26/2014  Decreased Interest 0 0 0 2  Down, Depressed, Hopeless 0 2 3 0 0 0 2  PHQ - 2 Score 0 0 0 4  Altered sleeping - - - 2  Tired, decreased energy - - - 1  Change in appetite - - - 1  Feeling bad or failure about yourself  0 2 3 - - - 0  Trouble concentrating 0 2 1 - - - 3  Moving slowly or fidgety/restless - - - 2  Suicidal thoughts 0 1 2 - - - 0  PHQ-9 Score - - - 13  Difficult doing work/chores - - - - - - Very difficult   GAD 7 : Generalized Anxiety Score 08/24/2018 05/15/2018 03/14/2018  Nervous, Anxious, on Edge Control/stop worrying 0 3 3  Worry too much - different things 0 3 3  Trouble relaxing Restless 0 2 1  Easily annoyed or irritable Afraid - awful might happen 0 2 2  Total GAD 7 Score Vision:will schedule  Dental:will schedule  Immunizations: (TDAP, Hep C screen, Pneumovax, Influenza, zoster)  Health Maintenance  Topic Date Due   PAP-Cervical Cytology Screening  09/01/2019   Pap Smear  09/01/2019   Tetanus Vaccine  01/04/2026   Flu Shot  Completed   HIV Screening  Completed   Diet:regular.  Weight:  Wt Readings from Last 3 Encounters:  08/24/18 279 lb 9.6 oz (126.8 kg)  08/17/18 278 lb 12.8 oz (126.5 kg)  07/23/18 282 lb 9.6 oz (128.2 kg)   Exercise:none  Fall Risk: Fall Risk  08/24/2018 07/19/2017 07/12/2016  Falls in the past year? 0 No No   Medications and allergies reviewed  with patient and updated if appropriate.  Patient Active Problem List   Diagnosis Date Noted   Morbid (severe) obesity due to excess calories (HCC) 08/24/2018   GAD (generalized anxiety disorder) 08/24/2018   Viral URI 08/17/2018   Acute pain of right knee 07/23/2018   Chronic right-sided low back pain with right-sided sciatica 05/15/2018   Moderate episode of recurrent major depressive disorder (HCC) 09/05/2017   Migraine without aura    Arthritis    Anxiety    Allergy    Increased bowel frequency 07/12/2016   Increased urinary frequency 07/12/2016   Class 3 severe obesity without serious comorbidity with body mass index (BMI) of 40.0 to 44.9 in adult [facility]) 09/26/2014   Rhinitis, allergic 09/09/2014    Current Outpatient Medications on File Prior to Visit  Medication Sig Dispense Refill   albuterol (VENTOLIN HFA) 108 (90 Base) MCG/ACT inhaler Inhale 1 puff into the lungs every 6 (six) hours as needed for wheezing or shortness of breath. Need office visit for additional refills 6.7 g 1   polyethylene glycol (MIRALAX / GLYCOLAX) packet Take 17 g by mouth daily as needed. 14 each  0   tiZANidine (ZANAFLEX) 4 MG capsule Take 1 capsule (4 mg total) by mouth daily as needed for muscle spasms. No additional refill before 07/21/2018 10 capsule 0   traZODone (DESYREL) 50 MG tablet Take 1 tablet (50 mg total) by mouth at bedtime. 90 tablet 1   Diclofenac Sodium (PENNSAID) 2 % SOLN Place 1 application onto the skin 2 (two) times daily. (Patient not taking: Reported on 08/17/2018) 1 Bottle 3   No current facility-administered medications on file prior to visit.     Past Medical History:  Diagnosis Date   Allergy    Anxiety    Arthritis    Migraine without aura     Past Surgical History:  Procedure Laterality Date   INTRAUTERINE DEVICE (IUD) INSERTION  04/2017   Mirena    KNEE ARTHROSCOPY Right     Social History   Socioeconomic History   Marital status:  Legally Separated    Spouse name: Not on file   Number of children: Not on file   Years of education: Not on file   Highest education level: Not on file  Occupational History   Not on file  Social Needs   Financial resource strain: Not on file   Food insecurity:    Worry: Not on file    Inability: Not on file   Transportation needs:    Medical: Not on file    Non-medical: Not on file  Tobacco Use   Smoking status: Never Smoker   Smokeless tobacco: Never Used  Substance and Sexual Activity   Alcohol use: No    Alcohol/week: 0.0 standard drinks   Drug use: No   Sexual activity: Yes    Partners: Male    Birth control/protection: I.U.D.  Lifestyle   Physical activity:    Days per week: Not on file    Minutes per session: Not on file   Stress: Not on file  Relationships   Social connections:    Talks on phone: Not on file    Gets together: Not on file    Attends religious service: Not on file    Active member of club or organization: Not on file    Attends meetings of clubs or organizations: Not on file    Relationship status: Not on file  Other Topics Concern   Not on file  Social History Narrative   Not on file    Family History  Problem Relation Age of Onset   Diabetes Father    Arthritis Father    Depression Father    Mental illness Father        PTSD, anxiety   Arthritis Mother        osteoartthritis   Fibroids Mother    Ovarian cysts Mother    Kidney disease Maternal Grandmother    Arthritis Maternal Grandmother    Prostate cancer Maternal Grandfather    Arthritis Maternal Grandfather    Arthritis Paternal Grandmother    Diabetes Paternal Grandfather    Mental illness Paternal Grandfather    Arthritis Paternal Grandfather         Review of Systems  Constitutional: Negative for fever, malaise/fatigue and weight loss.  HENT: Negative for congestion and sore throat.   Eyes:       Negative for visual changes    Respiratory: Negative for cough and shortness of breath.   Cardiovascular: Negative for chest pain, palpitations and leg swelling.  Gastrointestinal: Negative for blood in stool, constipation, diarrhea and heartburn.  Genitourinary: Negative for dysuria, frequency and urgency.  Musculoskeletal: Negative for falls, joint pain and myalgias.  Skin: Negative for rash.  Neurological: Negative for dizziness, sensory change and headaches.  Endo/Heme/Allergies: Does not bruise/bleed easily.  Psychiatric/Behavioral: Negative for depression, substance abuse and suicidal ideas. The patient is not nervous/anxious.     Objective:   Vitals:   08/24/18 0807  BP: 110/80  Pulse: 90  Temp: 99 F (37.2 C)  SpO2: 97%    Body mass index is 43.79 kg/Wall.   Physical Examination:  Physical Exam Vitals signs reviewed.  Constitutional:      General: She is not in acute distress.    Appearance: She is well-developed. She is obese.  HENT:     Right Ear: Tympanic membrane, ear canal and external ear normal.     Left Ear: Tympanic membrane, ear canal and external ear normal.     Nose: Nose normal.     Mouth/Throat:     Pharynx: No oropharyngeal exudate.  Eyes:     Conjunctiva/sclera: Conjunctivae normal.     Pupils: Pupils are equal, round, and reactive to light.  Neck:     Musculoskeletal: Normal range of motion and neck supple.  Cardiovascular:     Rate and Rhythm: Normal rate and regular rhythm.     Pulses: Normal pulses.     Heart sounds: Normal heart sounds.  Pulmonary:     Effort: Pulmonary effort is normal. No respiratory distress.     Breath sounds: Normal breath sounds.  Chest:     Chest wall: No tenderness.  Abdominal:     General: Bowel sounds are normal.     Palpations: Abdomen is soft.  Genitourinary:    Comments: Deferred pelvic and breast exam to GYN per patient Musculoskeletal: Normal range of motion.  Lymphadenopathy:     Cervical: No cervical adenopathy.   Neurological:     Mental Status: She is alert and oriented to person, place, and time.     Deep Tendon Reflexes: Reflexes are normal and symmetric.  Psychiatric:        Mood and Affect: Mood normal.        Behavior: Behavior normal.        Thought Content: Thought content normal.     ASSESSMENT and PLAN:  Israela was seen today for annual exam.  Diagnoses and all orders for this visit:  Encounter for preventative adult health care exam with abnormal findings -     TSH -     Lipid panel -     Comprehensive metabolic panel  Moderate episode of recurrent major depressive disorder (HCC) -     busPIRone (BUSPAR) 15 MG tablet; Take 1 tablet (15 mg total) by mouth 2 (two) times daily. -     escitalopram (LEXAPRO) 20 MG tablet; Take 1 tablet (20 mg total) by mouth at bedtime.  GAD (generalized anxiety disorder) -     busPIRone (BUSPAR) 15 MG tablet; Take 1 tablet (15 mg total) by mouth 2 (two) times daily. -     escitalopram (LEXAPRO) 20 MG tablet; Take 1 tablet (20 mg total) by mouth at bedtime.  Morbid (severe) obesity due to excess calories (HCC)    No problem-specific Assessment & Plan notes found for this encounter.      Problem List Items Addressed This Visit      Other   GAD (generalized anxiety disorder)   Relevant Medications   busPIRone (BUSPAR) 15 MG tablet   escitalopram (LEXAPRO) 20  MG tablet   Moderate episode of recurrent major depressive disorder (HCC)   Relevant Medications   busPIRone (BUSPAR) 15 MG tablet   escitalopram (LEXAPRO) 20 MG tablet   Morbid (severe) obesity due to excess calories (HCC)    Other Visit Diagnoses    Encounter for preventative adult health care exam with abnormal findings    -  Primary   Relevant Orders   TSH (Completed)   Lipid panel (Completed)   Comprehensive metabolic panel (Completed)       Follow up: Return in about 3 months (around 11/24/2018) for anxiety and Depression.  Alysia Penna, NP

## 2018-08-24 NOTE — Patient Instructions (Addendum)
Go to lab for blood draw.  Make appt for annual eye exam.  Health Maintenance, Female Adopting a healthy lifestyle and getting preventive care can go a long way to promote health and wellness. Talk with your health care provider about what schedule of regular examinations is right for you. This is a good chance for you to check in with your provider about disease prevention and staying healthy. In between checkups, there are plenty of things you can do on your own. Experts have done a lot of research about which lifestyle changes and preventive measures are most likely to keep you healthy. Ask your health care provider for more information. Weight and diet Eat a healthy diet  Be sure to include plenty of vegetables, fruits, low-fat dairy products, and lean protein.  Do not eat a lot of foods high in solid fats, added sugars, or salt.  Get regular exercise. This is one of the most important things you can do for your health. ? Most adults should exercise for at least 150 minutes each week. The exercise should increase your heart rate and make you sweat (moderate-intensity exercise). ? Most adults should also do strengthening exercises at least twice a week. This is in addition to the moderate-intensity exercise. Maintain a healthy weight  Body mass index (BMI) is a measurement that can be used to identify possible weight problems. It estimates body fat based on height and weight. Your health care provider can help determine your BMI and help you achieve or maintain a healthy weight.  For females 68 years of age and older: ? A BMI below 18.5 is considered underweight. ? A BMI of 18.5 to 24.9 is normal. ? A BMI of 25 to 29.9 is considered overweight. ? A BMI of 30 and above is considered obese. Watch levels of cholesterol and blood lipids  You should start having your blood tested for lipids and cholesterol at 30 years of age, then have this test every 5 years.  You may need to have your  cholesterol levels checked more often if: ? Your lipid or cholesterol levels are high. ? You are older than 30 years of age. ? You are at high risk for heart disease. Cancer screening Lung Cancer  Lung cancer screening is recommended for adults 53-64 years old who are at high risk for lung cancer because of a history of smoking.  A yearly low-dose CT scan of the lungs is recommended for people who: ? Currently smoke. ? Have quit within the past 15 years. ? Have at least a 30-pack-year history of smoking. A pack year is smoking an average of one pack of cigarettes a day for 1 year.  Yearly screening should continue until it has been 15 years since you quit.  Yearly screening should stop if you develop a health problem that would prevent you from having lung cancer treatment. Breast Cancer  Practice breast self-awareness. This means understanding how your breasts normally appear and feel.  It also means doing regular breast self-exams. Let your health care provider know about any changes, no matter how small.  If you are in your 20s or 30s, you should have a clinical breast exam (CBE) by a health care provider every 1-3 years as part of a regular health exam.  If you are 26 or older, have a CBE every year. Also consider having a breast X-ray (mammogram) every year.  If you have a family history of breast cancer, talk to your health care provider  about genetic screening.  If you are at high risk for breast cancer, talk to your health care provider about having an MRI and a mammogram every year.  Breast cancer gene (BRCA) assessment is recommended for women who have family members with BRCA-related cancers. BRCA-related cancers include: ? Breast. ? Ovarian. ? Tubal. ? Peritoneal cancers.  Results of the assessment will determine the need for genetic counseling and BRCA1 and BRCA2 testing. Cervical Cancer Your health care provider may recommend that you be screened regularly for  cancer of the pelvic organs (ovaries, uterus, and vagina). This screening involves a pelvic examination, including checking for microscopic changes to the surface of your cervix (Pap test). You may be encouraged to have this screening done every 3 years, beginning at age 31.  For women ages 2-65, health care providers may recommend pelvic exams and Pap testing every 3 years, or they may recommend the Pap and pelvic exam, combined with testing for human papilloma virus (HPV), every 5 years. Some types of HPV increase your risk of cervical cancer. Testing for HPV may also be done on women of any age with unclear Pap test results.  Other health care providers may not recommend any screening for nonpregnant women who are considered low risk for pelvic cancer and who do not have symptoms. Ask your health care provider if a screening pelvic exam is right for you.  If you have had past treatment for cervical cancer or a condition that could lead to cancer, you need Pap tests and screening for cancer for at least 20 years after your treatment. If Pap tests have been discontinued, your risk factors (such as having a new sexual partner) need to be reassessed to determine if screening should resume. Some women have medical problems that increase the chance of getting cervical cancer. In these cases, your health care provider may recommend more frequent screening and Pap tests. Colorectal Cancer  This type of cancer can be detected and often prevented.  Routine colorectal cancer screening usually begins at 30 years of age and continues through 30 years of age.  Your health care provider may recommend screening at an earlier age if you have risk factors for colon cancer.  Your health care provider may also recommend using home test kits to check for hidden blood in the stool.  A small camera at the end of a tube can be used to examine your colon directly (sigmoidoscopy or colonoscopy). This is done to check for  the earliest forms of colorectal cancer.  Routine screening usually begins at age 88.  Direct examination of the colon should be repeated every 5-10 years through 30 years of age. However, you may need to be screened more often if early forms of precancerous polyps or small growths are found. Skin Cancer  Check your skin from head to toe regularly.  Tell your health care provider about any new moles or changes in moles, especially if there is a change in a mole's shape or color.  Also tell your health care provider if you have a mole that is larger than the size of a pencil eraser.  Always use sunscreen. Apply sunscreen liberally and repeatedly throughout the day.  Protect yourself by wearing long sleeves, pants, a wide-brimmed hat, and sunglasses whenever you are outside. Heart disease, diabetes, and high blood pressure  High blood pressure causes heart disease and increases the risk of stroke. High blood pressure is more likely to develop in: ? People who have blood  pressure in the high end of the normal range (130-139/85-89 mm Hg). ? People who are overweight or obese. ? People who are African American.  If you are 73-8 years of age, have your blood pressure checked every 3-5 years. If you are 47 years of age or older, have your blood pressure checked every year. You should have your blood pressure measured twice-once when you are at a hospital or clinic, and once when you are not at a hospital or clinic. Record the average of the two measurements. To check your blood pressure when you are not at a hospital or clinic, you can use: ? An automated blood pressure machine at a pharmacy. ? A home blood pressure monitor.  If you are between 27 years and 28 years old, ask your health care provider if you should take aspirin to prevent strokes.  Have regular diabetes screenings. This involves taking a blood sample to check your fasting blood sugar level. ? If you are at a normal weight and  have a low risk for diabetes, have this test once every three years after 30 years of age. ? If you are overweight and have a high risk for diabetes, consider being tested at a younger age or more often. Preventing infection Hepatitis B  If you have a higher risk for hepatitis B, you should be screened for this virus. You are considered at high risk for hepatitis B if: ? You were born in a country where hepatitis B is common. Ask your health care provider which countries are considered high risk. ? Your parents were born in a high-risk country, and you have not been immunized against hepatitis B (hepatitis B vaccine). ? You have HIV or AIDS. ? You use needles to inject street drugs. ? You live with someone who has hepatitis B. ? You have had sex with someone who has hepatitis B. ? You get hemodialysis treatment. ? You take certain medicines for conditions, including cancer, organ transplantation, and autoimmune conditions. Hepatitis C  Blood testing is recommended for: ? Everyone born from [birth year] through 1967. ? Anyone with known risk factors for hepatitis C. Sexually transmitted infections (STIs)  You should be screened for sexually transmitted infections (STIs) including gonorrhea and chlamydia if: ? You are sexually active and are younger than 30 years of age. ? You are older than 30 years of age and your health care provider tells you that you are at risk for this type of infection. ? Your sexual activity has changed since you were last screened and you are at an increased risk for chlamydia or gonorrhea. Ask your health care provider if you are at risk.  If you do not have HIV, but are at risk, it may be recommended that you take a prescription medicine daily to prevent HIV infection. This is called pre-exposure prophylaxis (PrEP). You are considered at risk if: ? You are sexually active and do not regularly use condoms or know the HIV status of your partner(s). ? You take drugs by  injection. ? You are sexually active with a partner who has HIV. Talk with your health care provider about whether you are at high risk of being infected with HIV. If you choose to begin PrEP, you should first be tested for HIV. You should then be tested every 3 months for as long as you are taking PrEP. Pregnancy  If you are premenopausal and you may become pregnant, ask your health care provider about preconception counseling.  If  you may become pregnant, take 400 to 800 micrograms (mcg) of folic acid every day.  If you want to prevent pregnancy, talk to your health care provider about birth control (contraception). Osteoporosis and menopause  Osteoporosis is a disease in which the bones lose minerals and strength with aging. This can result in serious bone fractures. Your risk for osteoporosis can be identified using a bone density scan.  If you are 36 years of age or older, or if you are at risk for osteoporosis and fractures, ask your health care provider if you should be screened.  Ask your health care provider whether you should take a calcium or vitamin D supplement to lower your risk for osteoporosis.  Menopause may have certain physical symptoms and risks.  Hormone replacement therapy may reduce some of these symptoms and risks. Talk to your health care provider about whether hormone replacement therapy is right for you. Follow these instructions at home:  Schedule regular health, dental, and eye exams.  Stay current with your immunizations.  Do not use any tobacco products including cigarettes, chewing tobacco, or electronic cigarettes.  If you are pregnant, do not drink alcohol.  If you are breastfeeding, limit how much and how often you drink alcohol.  Limit alcohol intake to no more than 1 drink per day for nonpregnant women. One drink equals 12 ounces of beer, 5 ounces of wine, or 1 ounces of hard liquor.  Do not use street drugs.  Do not share needles.  Ask  your health care provider for help if you need support or information about quitting drugs.  Tell your health care provider if you often feel depressed.  Tell your health care provider if you have ever been abused or do not feel safe at home. This information is not intended to replace advice given to you by your health care provider. Make sure you discuss any questions you have with your health care provider. Document Released: 12/06/2010 Document Revised: 10/29/2015 Document Reviewed: 02/24/2015 Elsevier Interactive Patient Education  2019 [employer].

## 2018-08-27 ENCOUNTER — Encounter: Payer: Self-pay | Admitting: Nurse Practitioner

## 2018-08-29 ENCOUNTER — Encounter: Payer: Self-pay | Admitting: Nurse Practitioner

## 2018-08-29 ENCOUNTER — Ambulatory Visit: Payer: BLUE CROSS/BLUE SHIELD | Admitting: Psychology

## 2018-10-05 ENCOUNTER — Telehealth: Payer: Self-pay | Admitting: Nurse Practitioner

## 2018-10-05 NOTE — Telephone Encounter (Signed)
LVM for the pt to call back to make 3 mo follow up in 11/2018 with [name].

## 2018-10-08 ENCOUNTER — Telehealth: Payer: Self-pay | Admitting: Nurse Practitioner

## 2018-10-08 NOTE — Telephone Encounter (Signed)
Called and left vm. Calling to schedule virtual visit with [name] in May

## 2018-10-15 ENCOUNTER — Telehealth: Payer: Self-pay | Admitting: Nurse Practitioner

## 2018-10-15 NOTE — Telephone Encounter (Signed)
Called patient and no vm. Calling to schedule 3 month f/u with [name].

## 2018-10-17 ENCOUNTER — Telehealth: Payer: Self-pay | Admitting: Nurse Practitioner

## 2018-10-17 NOTE — Telephone Encounter (Signed)
Called patient three times to schedule mychart message patient sent on 10/13/2018. Patient is to be schedule for a 3 month f/u with [name].

## 2018-10-24 ENCOUNTER — Ambulatory Visit: Payer: BLUE CROSS/BLUE SHIELD | Admitting: Family Medicine

## 2018-10-24 ENCOUNTER — Other Ambulatory Visit: Payer: Self-pay

## 2018-10-24 ENCOUNTER — Encounter: Payer: Self-pay | Admitting: Family Medicine

## 2018-10-24 VITALS — BP 118/82 | HR 82 | Temp 98.6°F | Ht 67.0 in | Wt 292.0 lb

## 2018-10-24 DIAGNOSIS — M7651 Patellar tendinitis, right knee: Secondary | ICD-10-CM

## 2018-10-24 DIAGNOSIS — M25571 Pain in right ankle and joints of right foot: Secondary | ICD-10-CM | POA: Insufficient documentation

## 2018-10-24 HISTORY — DX: Patellar tendinitis, right knee: M76.51

## 2018-10-24 HISTORY — DX: Pain in right ankle and joints of right foot: M25.571

## 2018-10-24 NOTE — Progress Notes (Signed)
Carmen Wall [place] - 27 y.o. female MRN [id number]  Date of birth: 14-Dec-1990  SUBJECTIVE:  Including CC & ROS.  Chief Complaint  Patient presents with  . Pain    right ankle and knee pain/ 4 days/ not sure if she stepped wrong     Carmen Wall [place] is a 28 y.o. female that is  Presenting with right anterior knee pain and right lateral ankle pain. The pain started 4 days ago. Unsure if she stepped wrong while out in the yard. She has pain with prolonged standing and going up and down stairs. Has tried pennsaid with limited improvement. Has a history of sciatica. No mechanical symptoms. Pain in the knee can be sharp. It is located at the proximal tibia where the patellar tendon inserts. Does have some radiation down the anterior aspect of the tibia. The lateral ankle pain is localized and worse with certain movements. No history of ankle sprain. .    Review of Systems  Constitutional: Negative for fever.  HENT: Negative for congestion.   Respiratory: Negative for cough.   Cardiovascular: Negative for chest pain.  Gastrointestinal: Negative for abdominal pain.  Musculoskeletal: Positive for gait problem.  Skin: Negative for color change.  Neurological: Negative for weakness.  Hematological: Negative for adenopathy.    HISTORY: Past Medical, Surgical, Social, and Family History Reviewed & Updated per EMR.   Pertinent Historical Findings include:  Past Medical History:  Diagnosis Date  . Allergy   . Anxiety   . Arthritis   . Migraine without aura     Past Surgical History:  Procedure Laterality Date  . INTRAUTERINE DEVICE (IUD) INSERTION  04/2017   Mirena   . KNEE ARTHROSCOPY Right     No Known Allergies  Family History  Problem Relation Age of Onset  . Diabetes Father   . Arthritis Father   . Depression Father   . Mental illness Father        PTSD, anxiety  . Arthritis Mother        osteoartthritis  . Fibroids Mother   . Ovarian cysts Mother   . Kidney  disease Maternal Grandmother   . Arthritis Maternal Grandmother   . Prostate cancer Maternal Grandfather   . Arthritis Maternal Grandfather   . Arthritis Paternal Grandmother   . Diabetes Paternal Grandfather   . Mental illness Paternal Grandfather   . Arthritis Paternal Grandfather      Social History   Socioeconomic History  . Marital status: Legally Separated    Spouse name: Not on file  . Number of children: Not on file  . Years of education: Not on file  . Highest education level: Not on file  Occupational History  . Not on file  Social Needs  . Financial resource strain: Not on file  . Food insecurity:    Worry: Not on file    Inability: Not on file  . Transportation needs:    Medical: Not on file    Non-medical: Not on file  Tobacco Use  . Smoking status: Never Smoker  . Smokeless tobacco: Never Used  Substance and Sexual Activity  . Alcohol use: No    Alcohol/week: 0.0 standard drinks  . Drug use: No  . Sexual activity: Yes    Partners: Male    Birth control/protection: I.U.D.  Lifestyle  . Physical activity:    Days per week: Not on file    Minutes per session: Not on file  . Stress: Not on file  Relationships  . Social connections:    Talks on phone: Not on file    Gets together: Not on file    Attends religious service: Not on file    Active member of club or organization: Not on file    Attends meetings of clubs or organizations: Not on file    Relationship status: Not on file  . Intimate partner violence:    Fear of current or ex partner: Not on file    Emotionally abused: Not on file    Physically abused: Not on file    Forced sexual activity: Not on file  Other Topics Concern  . Not on file  Social History Narrative  . Not on file     PHYSICAL EXAM:  VS: BP 118/82   Pulse 82   Temp 98.6 F (37 C) (Oral)   Ht 5\' 7"  (1.702 Wall)   Wt 292 lb (132.5 kg)   SpO2 99%   BMI 45.73 kg/Wall  Physical Exam Gen: NAD, alert, cooperative with exam,  well-appearing ENT: normal lips, normal nasal mucosa,  Eye: normal EOM, normal conjunctiva and lids CV:  no edema, +2 pedal pulses   Resp: no accessory muscle use, non-labored,  Skin: no rashes, no areas of induration  Neuro: normal tone, normal sensation to touch Psych:  normal insight, alert and oriented MSK:  Right knee:  No effusion  TTP of the patellar tendon at the insertion.  No joint line tenderness  Normal ROM  Normal strength to resistance  Negative Mcmurray's test  No pain with patellar grind  No significant hip abduction weakness.  Right ankle:  No subluxation  Normal ROM  Normal strength to resistance  No significant instability with one leg standing  Neurovascularly intact       ASSESSMENT & PLAN:   Jumper's knee of right side Pain seems to involve more of the patellar tendon. Possible for jumper's knee. Could have PF syndrome as well.  - counseled on HEP and supportive care - provided Rayos samples. Counseled and instructed on its use  - can try jumper's knee brace - if no improvement consider PT   Acute right ankle pain Doesn't seem to have a significant amount of instability. May be related to sciatic type symptoms  - lace up brace provided  - counseled on HEp and PT  - if no improvement consider PT or imaging.

## 2018-10-24 NOTE — Patient Instructions (Signed)
Good to see you Please try the ankle brace for 2-3 weeks  Please try ice after your shift  Please try a jumper's knee brace  Please try the range of motion once during your shift.   Please send me a message in MyChart with any questions or updates.  Please see me back in 4-6 weeks if no better.   --Dr. Jordan Likes

## 2018-10-24 NOTE — Assessment & Plan Note (Signed)
Doesn't seem to have a significant amount of instability. May be related to sciatic type symptoms  - lace up brace provided  - counseled on HEp and PT  - if no improvement consider PT or imaging.

## 2018-10-24 NOTE — Assessment & Plan Note (Signed)
Pain seems to involve more of the patellar tendon. Possible for jumper's knee. Could have PF syndrome as well.  - counseled on HEP and supportive care - provided Rayos samples. Counseled and instructed on its use  - can try jumper's knee brace - if no improvement consider PT

## 2019-01-05 ENCOUNTER — Other Ambulatory Visit: Payer: Self-pay | Admitting: Nurse Practitioner

## 2019-01-05 DIAGNOSIS — F331 Major depressive disorder, recurrent, moderate: Secondary | ICD-10-CM

## 2019-02-12 ENCOUNTER — Other Ambulatory Visit: Payer: Self-pay

## 2019-02-13 ENCOUNTER — Encounter: Payer: Self-pay | Admitting: Nurse Practitioner

## 2019-02-13 ENCOUNTER — Ambulatory Visit: Payer: Self-pay | Admitting: Nurse Practitioner

## 2019-02-13 ENCOUNTER — Other Ambulatory Visit: Payer: Self-pay

## 2019-02-13 VITALS — BP 128/86 | HR 77 | Temp 98.2°F | Ht 67.0 in | Wt 297.0 lb

## 2019-02-13 DIAGNOSIS — G43009 Migraine without aura, not intractable, without status migrainosus: Secondary | ICD-10-CM | POA: Diagnosis not present

## 2019-02-13 DIAGNOSIS — Z23 Encounter for immunization: Secondary | ICD-10-CM | POA: Diagnosis not present

## 2019-02-13 MED ORDER — TOPIRAMATE 25 MG PO CPSP: ORAL_CAPSULE | ORAL | 1 refills | Status: DC

## 2019-02-13 MED ORDER — TIZANIDINE HCL 4 MG PO CAPS: 4.0000 mg | ORAL_CAPSULE | Freq: Three times a day (TID) | ORAL | 0 refills | Status: DC | PRN

## 2019-02-13 MED ORDER — SUMATRIPTAN SUCCINATE 25 MG PO TABS: 25.0000 mg | ORAL_TABLET | ORAL | 0 refills | Status: DC | PRN

## 2019-02-13 NOTE — Patient Instructions (Addendum)
Start topamax as directed. Use imitrex as needed for migrain headache.  You will be contacted to schedule appt with weight loss clinic.   Migraine Headache A migraine headache is a very strong throbbing pain on one side or both sides of your head. This type of headache can also cause other symptoms. It can last from 4 hours to 3 days. Talk with your doctor about what things may bring on (trigger) this condition. What are the causes? The exact cause of this condition is not known. This condition may be triggered or caused by:  Drinking alcohol.  Smoking.  Taking medicines, such as: ? Medicine used to treat chest pain (nitroglycerin). ? Birth control pills. ? Estrogen. ? Some blood pressure medicines.  Eating or drinking certain products.  Doing physical activity. Other things that may trigger a migraine headache include:  Having a menstrual period.  Pregnancy.  Hunger.  Stress.  Not getting enough sleep or getting too much sleep.  Weather changes.  Tiredness (fatigue). What increases the risk?  Being 30-30 years old.  Being female.  Having a family history of migraine headaches.  Being Caucasian.  Having depression or anxiety.  Being very overweight. What are the signs or symptoms?  A throbbing pain. This pain may: ? Happen in any area of the head, such as on one side or both sides. ? Make it hard to do daily activities. ? Get worse with physical activity. ? Get worse around bright lights or loud noises.  Other symptoms may include: ? Feeling sick to your stomach (nauseous). ? Vomiting. ? Dizziness. ? Being sensitive to bright lights, loud noises, or smells.  Before you get a migraine headache, you may get warning signs (an aura). An aura may include: ? Seeing flashing lights or having blind spots. ? Seeing bright spots, halos, or zigzag lines. ? Having tunnel vision or blurred vision. ? Having numbness or a tingling feeling. ? Having trouble  talking. ? Having weak muscles.  Some people have symptoms after a migraine headache (postdromal phase), such as: ? Tiredness. ? Trouble thinking (concentrating). How is this treated?  Taking medicines that: ? Relieve pain. ? Relieve the feeling of being sick to your stomach. ? Prevent migraine headaches.  Treatment may also include: ? Having acupuncture. ? Avoiding foods that bring on migraine headaches. ? Learning ways to control your body functions (biofeedback). ? Therapy to help you know and deal with negative thoughts (cognitive behavioral therapy). Follow these instructions at home: Medicines  Take over-the-counter and prescription medicines only as told by your doctor.  Ask your doctor if the medicine prescribed to you: ? Requires you to avoid driving or using heavy machinery. ? Can cause trouble pooping (constipation). You may need to take these steps to prevent or treat trouble pooping:  Drink enough fluid to keep your pee (urine) pale yellow.  Take over-the-counter or prescription medicines.  Eat foods that are high in fiber. These include beans, whole grains, and fresh fruits and vegetables.  Limit foods that are high in fat and sugar. These include fried or sweet foods. Lifestyle  Do not drink alcohol.  Do not use any products that contain nicotine or tobacco, such as cigarettes, e-cigarettes, and chewing tobacco. If you need help quitting, ask your doctor.  Get at least 8 hours of sleep every night.  Limit and deal with stress. General instructions      Keep a journal to find out what may bring on your migraine headaches. For example,  write down: ? What you eat and drink. ? How much sleep you get. ? Any change in what you eat or drink. ? Any change in your medicines.  If you have a migraine headache: ? Avoid things that make your symptoms worse, such as bright lights. ? It may help to lie down in a dark, quiet room. ? Do not drive or use heavy  machinery. ? Ask your doctor what activities are safe for you.  Keep all follow-up visits as told by your doctor. This is important. Contact a doctor if:  You get a migraine headache that is different or worse than others you have had.  You have more than 15 headache days in one month. Get help right away if:  Your migraine headache gets very bad.  Your migraine headache lasts longer than 72 hours.  You have a fever.  You have a stiff neck.  You have trouble seeing.  Your muscles feel weak or like you cannot control them.  You start to lose your balance a lot.  You start to have trouble walking.  You pass out (faint).  You have a seizure. Summary  A migraine headache is a very strong throbbing pain on one side or both sides of your head. These headaches can also cause other symptoms.  This condition may be treated with medicines and changes to your lifestyle.  Keep a journal to find out what may bring on your migraine headaches.  Contact a doctor if you get a migraine headache that is different or worse than others you have had.  Contact your doctor if you have more than 15 headache days in a month. This information is not intended to replace advice given to you by your health care provider. Make sure you discuss any questions you have with your health care provider. Document Released: 03/01/2008 Document Revised: 09/14/2018 Document Reviewed: 07/05/2018 Elsevier Patient Education  2020 [employer].

## 2019-02-13 NOTE — Progress Notes (Signed)
Subjective:  Patient ID: Carmen Wall [place], female    DOB: 09-Jan-1991  Age: 28 y.o. MRN: [id number]  CC: Neck Pain (pt is c/o of neck pain,top of shoulder and neck area-migrains--light headach and dizzy/going on 1 yr but getting bad 4 mo ago/took migrains med,heat and ice/ req flu shot?)  Migraine  This is a chronic problem. The current episode started more than 1 year ago. The problem occurs intermittently. The problem has been unchanged. The pain is located in the occipital and temporal region. The pain radiates to the left neck, right neck, right shoulder and left shoulder. The pain quality is similar to prior headaches. The quality of the pain is described as aching and dull. The pain is severe. Associated symptoms include anorexia, phonophobia and photophobia. Pertinent negatives include no blurred vision, coughing, eye pain, eye redness, eye watering, fever, hearing loss, insomnia, loss of balance, muscle aches, nausea, rhinorrhea, scalp tenderness, sinus pressure, sore throat, tingling, tinnitus, visual change, vomiting or weakness. The symptoms are aggravated by emotional stress and menstrual cycle. She has tried Excedrin for the symptoms. The treatment provided moderate relief. Her past medical history is significant for migraine headaches and obesity. There is no history of cancer, cluster headaches, hypertension, immunosuppression, migraines in the family, pseudotumor cerebri, recent head traumas or sinus disease.   Reviewed past Medical, Social and Family history today.  Outpatient Medications Prior to Visit  Medication Sig Dispense Refill  . albuterol (VENTOLIN HFA) 108 (90 Base) MCG/ACT inhaler Inhale 1 puff into the lungs every 6 (six) hours as needed for wheezing or shortness of breath. Need office visit for additional refills 6.7 g 1  . busPIRone (BUSPAR) 15 MG tablet Take 1 tablet (15 mg total) by mouth 2 (two) times daily. 180 tablet 3  . Diclofenac Sodium (PENNSAID) 2 % SOLN  Place 1 application onto the skin 2 (two) times daily. 1 Bottle 3  . escitalopram (LEXAPRO) 20 MG tablet Take 1 tablet (20 mg total) by mouth at bedtime. 90 tablet 3  . polyethylene glycol (MIRALAX / GLYCOLAX) packet Take 17 g by mouth daily as needed. 14 each 0  . traZODone (DESYREL) 50 MG tablet Take 1 tablet (50 mg total) by mouth at bedtime. Need office visit for additional refills 90 tablet 1  . tiZANidine (ZANAFLEX) 4 MG capsule Take 1 capsule (4 mg total) by mouth daily as needed for muscle spasms. No additional refill before 07/21/2018 10 capsule 0   No facility-administered medications prior to visit.     ROS See HPI  Objective:  BP 128/86   Pulse 77   Temp 98.2 F (36.8 C) (Tympanic)   Ht 5\' 7"  (1.702 Wall)   Wt 297 lb (134.7 kg)   SpO2 98%   BMI 46.52 kg/Wall   BP Readings from Last 3 Encounters:  02/13/19 128/86  10/24/18 118/82  08/24/18 110/80    Wt Readings from Last 3 Encounters:  02/13/19 297 lb (134.7 kg)  10/24/18 292 lb (132.5 kg)  08/24/18 279 lb 9.6 oz (126.8 kg)    Physical Exam Constitutional:      Appearance: She is obese.  Eyes:     Extraocular Movements: Extraocular movements intact.     Conjunctiva/sclera: Conjunctivae normal.     Pupils: Pupils are equal, round, and reactive to light.  Neck:     Musculoskeletal: Normal range of motion and neck supple.  Cardiovascular:     Rate and Rhythm: Normal rate and regular rhythm.  Pulses: Normal pulses.     Heart sounds: Normal heart sounds.  Pulmonary:     Effort: Pulmonary effort is normal.  Musculoskeletal:     Right lower leg: No edema.     Left lower leg: No edema.  Lymphadenopathy:     Cervical: No cervical adenopathy.  Neurological:     Mental Status: She is alert and oriented to person, place, and time.     Cranial Nerves: No cranial nerve deficit.     Gait: Gait normal.  Psychiatric:        Mood and Affect: Mood normal.        Behavior: Behavior normal.        Thought Content:  Thought content normal.     Lab Results  Component Value Date   WBC 5.5 02/06/2018   HGB 13.3 02/06/2018   HCT 40.0 02/06/2018   PLT 232 02/06/2018   GLUCOSE 85 08/24/2018   CHOL 163 08/24/2018   TRIG 105.0 08/24/2018   HDL 36.50 (L) 08/24/2018   LDLCALC 106 (H) 08/24/2018   ALT 19 08/24/2018   AST 17 08/24/2018   NA 140 08/24/2018   K 3.9 08/24/2018   CL 106 08/24/2018   CREATININE 0.73 08/24/2018   BUN 9 08/24/2018   CO2 26 08/24/2018   TSH 1.41 08/24/2018   HGBA1C 4.9 09/05/2017    Ct Abdomen Pelvis W Contrast  Result Date: 05/12/2017 CLINICAL DATA:  Lower abdominal pain radiates to low back. Associated with nausea diarrhea and chills. EXAM: CT ABDOMEN AND PELVIS WITH CONTRAST TECHNIQUE: Multidetector CT imaging of the abdomen and pelvis was performed using the standard protocol following bolus administration of intravenous contrast. CONTRAST:  ISOVUE-300 IOPAMIDOL (ISOVUE-300) INJECTION 61% COMPARISON:  07/25/2015 FINDINGS: Lower chest:  Unremarkable. Hepatobiliary: No focal abnormality within the liver parenchyma. There is no evidence for gallstones, gallbladder wall thickening, or pericholecystic fluid. No intrahepatic or extrahepatic biliary dilation. Pancreas: No focal mass lesion. No dilatation of the main duct. No intraparenchymal cyst. No peripancreatic edema. Spleen: 10 mm hypoattenuating subcapsular lesion in the posterior spleen and is likely a small cyst or pseudocyst. Adrenals/Urinary Tract: No adrenal nodule or mass. Kidneys are unremarkable. No evidence for hydroureter. The urinary bladder appears normal for the degree of distention. Stomach/Bowel: Stomach is nondistended. No gastric wall thickening. No evidence of outlet obstruction. Duodenum is normally positioned as is the ligament of Treitz. No small bowel wall thickening. No small bowel dilatation. The terminal ileum is normal. No gross colonic mass. No colonic wall thickening. No substantial diverticular  change. Vascular/Lymphatic: There is no gastrohepatic or hepatoduodenal ligament lymphadenopathy. No intraperitoneal or retroperitoneal lymphadenopathy. No abdominal aortic aneurysm. No abdominal aortic atherosclerotic calcification. No pelvic sidewall lymphadenopathy. Reproductive: IUD is visualized in the uterus. There is no adnexal mass. Other: Trace free fluid is evident in the cul-de-sac. Musculoskeletal: Bone windows reveal no worrisome lytic or sclerotic osseous lesions. IMPRESSION: 1. Unremarkable exam with no acute findings in the abdomen or pelvis. Specifically, no findings to explain the patient's history of lower abdominal pain radiating to the back. No GI tract abnormality to account for the nausea and diarrhea. Electronically Signed   By: Kennith Center Wall.D.   On: 05/12/2017 16:40    Assessment & Plan:   Dennie was seen today for neck pain.  Diagnoses and all orders for this visit:  Morbid (severe) obesity due to excess calories (HCC) -     Amb Ref to Medical Weight Management  Chronic right-sided low back  pain with right-sided sciatica  Migraine without aura and without status migrainosus, not intractable -     tiZANidine (ZANAFLEX) 4 MG capsule; Take 1 capsule (4 mg total) by mouth 3 (three) times daily as needed for muscle spasms. -     Discontinue: SUMAtriptan (IMITREX) 25 MG tablet; Take 1 tablet (25 mg total) by mouth every 2 (two) hours as needed for migraine. May repeat in 2 hours if headache persists or recurs. -     SUMAtriptan (IMITREX) 25 MG tablet; Take 1 tablet (25 mg total) by mouth every 2 (two) hours as needed for migraine. No more than 100mg  in 24hrs -     topiramate (TOPAMAX) 25 MG capsule; Take 1 capsule (25 mg total) by mouth at bedtime for 7 days, THEN 1 capsule (25 mg total) at bedtime for 23 days.  Need for immunization against influenza -     Flu Vaccine QUAD 36+ mos IM   I have changed [place]. [place]'s tiZANidine and SUMAtriptan. I am also  having her start on topiramate. Additionally, I am having her maintain her polyethylene glycol, albuterol, Diclofenac Sodium, busPIRone, escitalopram, and traZODone.  Meds ordered this encounter  Medications  . tiZANidine (ZANAFLEX) 4 MG capsule    Sig: Take 1 capsule (4 mg total) by mouth 3 (three) times daily as needed for muscle spasms.    Dispense:  14 capsule    Refill:  0    Order Specific Question:   Supervising Provider    Answer:   Lucille Passy [[id number]]  . DISCONTD: SUMAtriptan (IMITREX) 25 MG tablet    Sig: Take 1 tablet (25 mg total) by mouth every 2 (two) hours as needed for migraine. May repeat in 2 hours if headache persists or recurs.    Dispense:  10 tablet    Refill:  0    Order Specific Question:   Supervising Provider    Answer:   Lucille Passy [[id number]]  . SUMAtriptan (IMITREX) 25 MG tablet    Sig: Take 1 tablet (25 mg total) by mouth every 2 (two) hours as needed for migraine. No more than 100mg  in 24hrs    Dispense:  10 tablet    Refill:  0    Order Specific Question:   Supervising Provider    Answer:   Lucille Passy [[id number]]  . topiramate (TOPAMAX) 25 MG capsule    Sig: Take 1 capsule (25 mg total) by mouth at bedtime for 7 days, THEN 1 capsule (25 mg total) at bedtime for 23 days.    Dispense:  60 capsule    Refill:  1    Order Specific Question:   Supervising Provider    Answer:   Lucille Passy [[id number]]    Problem List Items Addressed This Visit      Cardiovascular and Mediastinum   Migraine without aura   Relevant Medications   tiZANidine (ZANAFLEX) 4 MG capsule   SUMAtriptan (IMITREX) 25 MG tablet   topiramate (TOPAMAX) 25 MG capsule     Nervous and Auditory   Chronic right-sided low back pain with right-sided sciatica   Relevant Medications   tiZANidine (ZANAFLEX) 4 MG capsule   topiramate (TOPAMAX) 25 MG capsule     Other   Morbid (severe) obesity due to excess calories (HCC) - Primary   Relevant Orders   Amb Ref to Medical Weight Management     Other Visit Diagnoses    Need for immunization against influenza  Relevant Orders   Flu Vaccine QUAD 36+ mos IM (Completed)       Follow-up: Return in about 4 weeks (around 03/13/2019) for migraine and depression (complete PHQ form-730mins).  Alysia Pennaharlotte Chanel Mckesson, NP

## 2019-02-14 ENCOUNTER — Telehealth: Payer: Self-pay | Admitting: Nurse Practitioner

## 2019-02-14 DIAGNOSIS — G43009 Migraine without aura, not intractable, without status migrainosus: Secondary | ICD-10-CM

## 2019-02-14 NOTE — Telephone Encounter (Signed)
[facility] is calling and needs clarification on topamax 25 mg.

## 2019-02-15 MED ORDER — TOPIRAMATE 25 MG PO CPSP: ORAL_CAPSULE | ORAL | 1 refills | Status: DC

## 2019-02-15 NOTE — Telephone Encounter (Signed)
Correction made. She is to take topamax 1cap at hs x 7days, then increase to 1cap BID continuous.

## 2019-02-15 NOTE — Telephone Encounter (Signed)
Carmen Wall pleas advise, there is 2 directions but quantity is 60 cap.

## 2019-02-18 NOTE — Telephone Encounter (Signed)
Pharmacy called back in to follow up. Advised per PCP response.

## 2019-03-13 ENCOUNTER — Encounter: Payer: Self-pay | Admitting: Nurse Practitioner

## 2019-03-13 ENCOUNTER — Other Ambulatory Visit: Payer: Self-pay

## 2019-03-13 ENCOUNTER — Ambulatory Visit: Payer: BC Managed Care – PPO | Admitting: Nurse Practitioner

## 2019-03-13 VITALS — BP 120/80 | HR 75 | Temp 98.5°F | Ht 67.0 in | Wt 295.8 lb

## 2019-03-13 DIAGNOSIS — F411 Generalized anxiety disorder: Secondary | ICD-10-CM

## 2019-03-13 DIAGNOSIS — G43009 Migraine without aura, not intractable, without status migrainosus: Secondary | ICD-10-CM | POA: Diagnosis not present

## 2019-03-13 DIAGNOSIS — F331 Major depressive disorder, recurrent, moderate: Secondary | ICD-10-CM

## 2019-03-13 DIAGNOSIS — G4719 Other hypersomnia: Secondary | ICD-10-CM | POA: Insufficient documentation

## 2019-03-13 HISTORY — DX: Other hypersomnia: G47.19

## 2019-03-13 MED ORDER — TOPIRAMATE 25 MG PO CPSP: 25.0000 mg | ORAL_CAPSULE | Freq: Two times a day (BID) | ORAL | 1 refills | Status: DC

## 2019-03-13 NOTE — Assessment & Plan Note (Signed)
Improved with topamax BID imitrex prn

## 2019-03-13 NOTE — Progress Notes (Signed)
Subjective:  Patient ID: Isolde M [place], female    DOB: 02/04/1991  Age: 28 y.o. MRN: [id number]  CC: Follow-up (Depression and migraines is better)  HPI Migraines: No migraine for 1week with use of topamax BID. Use imitrex once. It caused dizzness, so unable to take med while at work.  Anxiety and Depression: reports improved mood with lexapro and trazodone. She has use of buspar prn, but has not needed med for over 108month. She has discontinue therapy sessions due to improved mood. Depression screen [facility] 2/9 03/13/2019 08/24/2018 05/15/2018  Decreased Interest 0 1 1  Down, Depressed, Hopeless 0 0 2  PHQ - 2 Score 0 1 3  Altered sleeping 2 2 3   Tired, decreased energy 2 2 3   Change in appetite 2 2 3   Feeling bad or failure about yourself  0 0 2  Trouble concentrating 0 0 2  Moving slowly or fidgety/restless 1 1 3   Suicidal thoughts 0 0 1  PHQ-9 Score 7 8 20   Difficult doing work/chores - - -   GAD 7 : Generalized Anxiety Score 03/13/2019 08/24/2018 05/15/2018 03/14/2018  Nervous, Anxious, on Edge 1 1 3 3   Control/stop worrying 1 0 3 3  Worry too much - different things 1 0 3 3  Trouble relaxing 0 1 2 3   Restless 0 0 2 1  Easily annoyed or irritable 1 1 3 3   Afraid - awful might happen 1 0 2 2  Total GAD 7 Score 5 3 18 18    She also reports chronic excessive daytime sleepiness, Ongoing since high school, she feels rested in morning but still has need to sleep after 3hrs of waking up. Use of trazodone, lexapro and topamax has not made an impact on symptoms. Denies any report of snoring. Normal cbc, cmp and TSH. ESS score of 17 Reviewed past Medical, Social and Family history today.  Outpatient Medications Prior to Visit  Medication Sig Dispense Refill   albuterol (VENTOLIN HFA) 108 (90 Base) MCG/ACT inhaler Inhale 1 puff into the lungs every 6 (six) hours as needed for wheezing or shortness of breath. Need office visit for additional refills 6.7 g 1   busPIRone (BUSPAR)  15 MG tablet Take 1 tablet (15 mg total) by mouth 2 (two) times daily. 180 tablet 3   Diclofenac Sodium (PENNSAID) 2 % SOLN Place 1 application onto the skin 2 (two) times daily. 1 Bottle 3   escitalopram (LEXAPRO) 20 MG tablet Take 1 tablet (20 mg total) by mouth at bedtime. 90 tablet 3   polyethylene glycol (MIRALAX / GLYCOLAX) packet Take 17 g by mouth daily as needed. 14 each 0   tiZANidine (ZANAFLEX) 4 MG capsule Take 1 capsule (4 mg total) by mouth 3 (three) times daily as needed for muscle spasms. 14 capsule 0   traZODone (DESYREL) 50 MG tablet Take 1 tablet (50 mg total) by mouth at bedtime. Need office visit for additional refills 90 tablet 1   topiramate (TOPAMAX) 25 MG capsule Take 1 capsule (25 mg total) by mouth at bedtime for 7 days, THEN 1 capsule (25 mg total) 2 (two) times daily for 23 days. 60 capsule 1   SUMAtriptan (IMITREX) 25 MG tablet Take 1 tablet (25 mg total) by mouth every 2 (two) hours as needed for migraine. No more than 100mg  in 24hrs (Patient not taking: Reported on 03/13/2019) 10 tablet 0   No facility-administered medications prior to visit.     ROS See HPI  Objective:  BP 120/80    Pulse 75    Temp 98.5 F (36.9 C) (Oral)    Ht 5\' 7"  (1.702 m)    Wt 295 lb 12.8 oz (134.2 kg)    SpO2 97%    BMI 46.33 kg/m   BP Readings from Last 3 Encounters:  03/13/19 120/80  02/13/19 128/86  10/24/18 118/82    Wt Readings from Last 3 Encounters:  03/13/19 295 lb 12.8 oz (134.2 kg)  02/13/19 297 lb (134.7 kg)  10/24/18 292 lb (132.5 kg)    Physical Exam Constitutional:      Appearance: She is obese.  Cardiovascular:     Rate and Rhythm: Normal rate and regular rhythm.     Pulses: Normal pulses.     Heart sounds: Normal heart sounds.  Pulmonary:     Effort: Pulmonary effort is normal.     Breath sounds: Normal breath sounds.  Musculoskeletal:     Right lower leg: No edema.     Left lower leg: No edema.  Neurological:     Mental Status: She is alert  and oriented to person, place, and time.    Lab Results  Component Value Date   WBC 5.5 02/06/2018   HGB 13.3 02/06/2018   HCT 40.0 02/06/2018   PLT 232 02/06/2018   GLUCOSE 85 08/24/2018   CHOL 163 08/24/2018   TRIG 105.0 08/24/2018   HDL 36.50 (L) 08/24/2018   LDLCALC 106 (H) 08/24/2018   ALT 19 08/24/2018   AST 17 08/24/2018   NA 140 08/24/2018   K 3.9 08/24/2018   CL 106 08/24/2018   CREATININE 0.73 08/24/2018   BUN 9 08/24/2018   CO2 26 08/24/2018   TSH 1.41 08/24/2018   HGBA1C 4.9 09/05/2017    Ct Abdomen Pelvis W Contrast  Result Date: 05/12/2017 CLINICAL DATA:  Lower abdominal pain radiates to low back. Associated with nausea diarrhea and chills. EXAM: CT ABDOMEN AND PELVIS WITH CONTRAST TECHNIQUE: Multidetector CT imaging of the abdomen and pelvis was performed using the standard protocol following bolus administration of intravenous contrast. CONTRAST:  100mL ISOVUE-300 IOPAMIDOL (ISOVUE-300) INJECTION 61% COMPARISON:  07/25/2015 FINDINGS: Lower chest:  Unremarkable. Hepatobiliary: No focal abnormality within the liver parenchyma. There is no evidence for gallstones, gallbladder wall thickening, or pericholecystic fluid. No intrahepatic or extrahepatic biliary dilation. Pancreas: No focal mass lesion. No dilatation of the main duct. No intraparenchymal cyst. No peripancreatic edema. Spleen: 10 mm hypoattenuating subcapsular lesion in the posterior spleen and is likely a small cyst or pseudocyst. Adrenals/Urinary Tract: No adrenal nodule or mass. Kidneys are unremarkable. No evidence for hydroureter. The urinary bladder appears normal for the degree of distention. Stomach/Bowel: Stomach is nondistended. No gastric wall thickening. No evidence of outlet obstruction. Duodenum is normally positioned as is the ligament of Treitz. No small bowel wall thickening. No small bowel dilatation. The terminal ileum is normal. No gross colonic mass. No colonic wall thickening. No substantial  diverticular change. Vascular/Lymphatic: There is no gastrohepatic or hepatoduodenal ligament lymphadenopathy. No intraperitoneal or retroperitoneal lymphadenopathy. No abdominal aortic aneurysm. No abdominal aortic atherosclerotic calcification. No pelvic sidewall lymphadenopathy. Reproductive: IUD is visualized in the uterus. There is no adnexal mass. Other: Trace free fluid is evident in the cul-de-sac. Musculoskeletal: Bone windows reveal no worrisome lytic or sclerotic osseous lesions. IMPRESSION: 1. Unremarkable exam with no acute findings in the abdomen or pelvis. Specifically, no findings to explain the patient's history of lower abdominal pain radiating to the back. No GI tract abnormality  to account for the nausea and diarrhea. Electronically Signed   By: Kennith Center M.D.   On: 05/12/2017 16:40    Assessment & Plan:   Odaliz was seen today for follow-up.  Diagnoses and all orders for this visit:  Migraine without aura and without status migrainosus, not intractable -     topiramate (TOPAMAX) 25 MG capsule; Take 1 capsule (25 mg total) by mouth 2 (two) times daily.  GAD (generalized anxiety disorder)  Moderate episode of recurrent major depressive disorder (HCC)  Excessive daytime sleepiness -     Ambulatory referral to Neurology   I have changed Lance M. Ireland's topiramate. I am also having her maintain her polyethylene glycol, albuterol, Diclofenac Sodium, busPIRone, escitalopram, traZODone, tiZANidine, and SUMAtriptan.  Meds ordered this encounter  Medications   topiramate (TOPAMAX) 25 MG capsule    Sig: Take 1 capsule (25 mg total) by mouth 2 (two) times daily.    Dispense:  180 capsule    Refill:  1    Order Specific Question:   Supervising Provider    Answer:   Dianne Dun [[id number]]    Problem List Items Addressed This Visit      Cardiovascular and Mediastinum   Migraine without aura - Primary    Improved with topamax BID imitrex prn      Relevant  Medications   topiramate (TOPAMAX) 25 MG capsule     Other   Excessive daytime sleepiness    ESS score 17 Ongoing since high school, she feels rested in morning but still has need to sleep after 3hrs of waking up. Use of trazodone, lexapro and topamax has not made an impact on symptoms. Denies any report of snoring. Normal cbc, cmp and TSH.  Entered referral to neurology to eval for possible narcolepsy?      Relevant Orders   Ambulatory referral to Neurology   GAD (generalized anxiety disorder)    Improved GAD score: 18 to 3 Current use of lexapro daily, buspar prn, and therapy sessions prn. F/up in 63months      Moderate episode of recurrent major depressive disorder (HCC)    Improved PHQ-9 score: 20 to 8 No SI or HI. Current use of lexapro and trazodone daily. Therapy session prn F/up in 58months         Follow-up: Return in about 3 months (around 06/13/2019) for depression and anxiety ( ).  Alysia Penna, NP

## 2019-03-13 NOTE — Assessment & Plan Note (Signed)
ESS score 17 Ongoing since high school, she feels rested in morning but still has need to sleep after 3hrs of waking up. Use of trazodone, lexapro and topamax has not made an impact on symptoms. Denies any report of snoring. Normal cbc, cmp and TSH.  Entered referral to neurology to eval for possible narcolepsy?

## 2019-03-13 NOTE — Assessment & Plan Note (Addendum)
Improved PHQ-9 score: 20 to 8 No SI or HI. Current use of lexapro and trazodone daily. Therapy session prn F/up in 58months

## 2019-03-13 NOTE — Patient Instructions (Signed)
Continue current medications. You will be contacted to schedule appt with neurology.  Hypersomnia Hypersomnia is a condition in which a person feels very tired during the day even though he or she gets plenty of sleep at night. A person with this condition may take naps during the day and may find it very difficult to wake up from sleep. Hypersomnia may affect a person's ability to think, concentrate, drive, or remember things. What are the causes? The cause of this condition may not be known. Possible causes include:  Certain medicines.  Sleep disorders, such as narcolepsy and sleep apnea.  Injury to the head, brain, or spinal cord.  Drug or alcohol use.  Gastroesophageal reflux disease (GERD).  Tumors.  Certain medical conditions, such as depression, diabetes, or an underactive thyroid gland (hypothyroidism). What are the signs or symptoms? The main symptoms of hypersomnia include:  Feeling very tired throughout the day, regardless of how much sleep you got the night before.  Having trouble waking up. Others may find it difficult to wake you up when you are sleeping.  Sleeping for longer and longer periods at a time.  Taking naps throughout the day. Other symptoms may include:  Feeling restless, anxious, or annoyed.  Lacking energy.  Having trouble with: ? Remembering. ? Speaking. ? Thinking.  Loss of appetite.  Seeing, hearing, tasting, smelling, or feeling things that are not real (hallucinations). How is this diagnosed? This condition may be diagnosed based on:  Your symptoms and medical history.  Your sleeping habits. Your health care provider may ask you to write down your sleeping habits in a daily sleep log, along with any symptoms you have.  A series of tests that are done while you sleep (sleep study or polysomnogram).  A test that measures how quickly you can fall asleep during the day (daytime nap study or multiple sleep latency test). How is this  treated? Treatment can help you manage your condition. Treatment may include:  Following a regular sleep routine.  Lifestyle changes, such as changing your eating habits, getting regular exercise, and avoiding alcohol or caffeinated beverages.  Taking medicines to make you more alert (stimulants) during the day.  Treating any underlying medical causes of hypersomnia. Follow these instructions at home: Sleep routine   Schedule the same bedtime and wake-up time each day.  Practice a relaxing bedtime routine. This may include reading, meditation, deep breathing, or taking a warm bath before going to sleep.  Get regular exercise each day. Avoid strenuous exercise in the evening hours.  Keep your sleep environment at a cooler temperature, darkened, and quiet.  Sleep with pillows and a mattress that are comfortable and supportive.  Schedule short 20-minute naps for when you feel sleepiest during the day.  Talk with your employer or teachers about your hypersomnia. If possible, adjust your schedule so that: ? You have a regular daytime work schedule. ? You can take a scheduled nap during the day. ? You do not have to work or be active at night.  Do not eat a heavy meal for a few hours before bedtime. Eat your meals at about the same times every day.  Avoid drinking alcohol or caffeinated beverages. Safety   Do not drive or use heavy machinery if you are sleepy. Ask your health care provider if it is safe for you to drive.  Wear a life jacket when swimming or spending time near water. General instructions  Take supplements and over-the-counter and prescription medicines only as told  by your health care provider.  Keep a sleep log that will help your doctor manage your condition. This may include information about: ? What time you go to bed each night. ? How often you wake up at night. ? How many hours you sleep at night. ? How often and for how long you nap during the day. ?  Any observations from others, such as leg movements during sleep, sleep walking, or snoring.  Keep all follow-up visits as told by your health care provider. This is important. Contact a health care provider if:  You have new symptoms.  Your symptoms get worse. Get help right away if:  You have serious thoughts about hurting yourself or someone else. If you ever feel like you may hurt yourself or others, or have thoughts about taking your own life, get help right away. You can go to your nearest emergency department or call:  Your local emergency services (911 in the [place]  A suicide crisis helpline, such as the National Suicide Prevention Lifeline at (445)142-3600. This is open 24 hours a day. Summary  Hypersomnia refers to a condition in which you feel very tired during the day even though you get plenty of sleep at night.  A person with this condition may take naps during the day and may find it very difficult to wake up from sleep.  Hypersomnia may affect a person's ability to think, concentrate, drive, or remember things.  Treatment, such as following a regular sleep routine and making some lifestyle changes, can help you manage your condition. This information is not intended to replace advice given to you by your health care provider. Make sure you discuss any questions you have with your health care provider. Document Released: 05/13/2002 Document Revised: 05/25/2017 Document Reviewed: 05/25/2017 Elsevier Patient Education  2020 [employer].

## 2019-03-13 NOTE — Assessment & Plan Note (Signed)
Improved GAD score: 18 to 3 Current use of lexapro daily, buspar prn, and therapy sessions prn. F/up in 31months

## 2019-03-18 ENCOUNTER — Other Ambulatory Visit: Payer: Self-pay

## 2019-03-18 ENCOUNTER — Ambulatory Visit: Payer: BC Managed Care – PPO | Admitting: Neurology

## 2019-03-18 ENCOUNTER — Encounter: Payer: Self-pay | Admitting: Neurology

## 2019-03-18 VITALS — BP 130/81 | HR 60 | Ht 67.0 in | Wt 294.0 lb

## 2019-03-18 DIAGNOSIS — G478 Other sleep disorders: Secondary | ICD-10-CM

## 2019-03-18 DIAGNOSIS — G4710 Hypersomnia, unspecified: Secondary | ICD-10-CM

## 2019-03-18 DIAGNOSIS — R400 Somnolence: Secondary | ICD-10-CM

## 2019-03-18 DIAGNOSIS — R0683 Snoring: Secondary | ICD-10-CM

## 2019-03-18 DIAGNOSIS — Z6841 Body Mass Index (BMI) 40.0 and over, adult: Secondary | ICD-10-CM

## 2019-03-18 DIAGNOSIS — R6889 Other general symptoms and signs: Secondary | ICD-10-CM

## 2019-03-18 DIAGNOSIS — Z820 Family history of epilepsy and other diseases of the nervous system: Secondary | ICD-10-CM

## 2019-03-18 DIAGNOSIS — G4719 Other hypersomnia: Secondary | ICD-10-CM

## 2019-03-18 NOTE — Patient Instructions (Addendum)
  Thank you for choosing [facility] for your sleep related care! It was nice to meet you today! I appreciate that you entrust me with your sleep related healthcare concerns. I hope, I was able to address at least some of your concerns today, and that I can help you feel reassured and also get better.    Here is what we discussed today and what we came up with as our plan for you:    Based on your symptoms and your exam I believe you may be at some risk for obstructive sleep apnea or OSA, and I think we should recheck your sleep for sleep apnea. We will look into your severe sleepiness with a nighttime sleep study, followed by a daytime nap study. In preparation for sleep study testing, you would have to be off of any psychotropic or neurotropic medications that can potentially make you sleepy.  Therefore, talk to [facility] about coming off of the Lexapro. You would have to taper this and you Would have to be off for 2 weeks of the Lexapro, trazodone, BuSpar, Zanaflex, and Topamax.  You cannot be on any narcotic pain medication or Marijuana either. Do no drive when sleepy!   Please keep your sleep schedule stable and do not add any additional medications or caffeine in your day to day routine, in preparation of the study.   Our sleep lab administrative assistant will call you to schedule your sleep study. If you don't hear back from her by about 2 weeks from now, please feel free to call her at [phone]. You can leave a message with your phone number and concerns, if you get the voicemail box. She will call back as soon as possible.

## 2019-03-18 NOTE — Progress Notes (Signed)
Subjective:    Patient ID: Carmen Wall Carmen Wall is a 28 y.o. female.  HPI     Carmen FoleySaima Levelle Edelen, MD, PhD [facility] [address], [address] [address] [place], [place] [id number]  Dear Carmen Wall, I saw your patient, Carmen Wall Carmen Wall, upon your kind request in my sleep clinic today for initial consultation of her sleep disorder, in particular, concern for underlying obstructive sleep apnea.  The patient is unaccompanied today.  As you know, Carmen Wall is a 28 year old right-handed woman with an underlying medical of migraines, anxiety, depression, arthritis, allergies and morbid obesity with a BMI of over 45, who reports being sleepy for years, since high school years. I reviewed your office note from 03/13/2019.  Her Epworth sleepiness score is 19 out of 24, fatigue severity score is 59 out of 63. Of note, she is currently on Lexapro and trazodone, has been on these medicines here.  She takes trazodone at night for sleep.  She also takes BuSpar 15 mg up to twice daily as needed for anxiety.  She has a history of migraines and takes Topamax twice daily and also has a prescription for Zanaflex.  Her father has sleep apnea and uses a CPAP machine.  She sleeps anywhere from 7 to 10 hours.  She does not wake up rested.  She can take a nap despite sleeping up to 10 hours.  Bedtime is generally between 930 and 10 and rise time around 6.  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  She denies any recurrent morning headaches or night to night nocturia.  She lives with her 30-year-old daughter, they have a dog in the household who sleeps in her bedroom.  Daughter sometimes sleeps with her but generally sleeps in her own bedroom.  The patient does not watch TV in the bedroom typically although she does have a TV in her bedroom.  She is trying to reduce her caffeine, does not drink coffee or soda currently for the past 2 weeks.  Drinks about 1-2 servings of unsweetened tea per  day.  She drinks alcohol rarely on special occasions typically, she is a non-smoker.  She endorses vivid dreams and sleep starts, no telltale hypnagogic or hypnopompic hallucinations, no telltale symptoms of restless legs although she is a restless sleeper and tends to toss and turn a lot, no leg movements or kicking reported per her boyfriend.  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   Her Past Medical History Is Significant For: Past Medical History:  Diagnosis Date  . Allergy   . Anxiety   . Arthritis   . Migraine without aura     Her Past Surgical History Is Significant For: Past Surgical History:  Procedure Laterality Date  . INTRAUTERINE DEVICE (IUD) INSERTION  04/2017   Mirena   . KNEE ARTHROSCOPY Right     Her Family History Is Significant For: Family History  Problem Relation Age of Onset  . Diabetes Father   . Arthritis Father   . Depression Father   . Mental illness Father        PTSD, anxiety  . Arthritis Mother        osteoartthritis  . Fibroids Mother   . Ovarian cysts Mother   . Kidney disease Maternal Grandmother   . Arthritis Maternal Grandmother   . Prostate cancer Maternal Grandfather   . Arthritis Maternal Grandfather   . Arthritis Paternal Grandmother   . Diabetes Paternal Grandfather   . Mental  illness Paternal Grandfather   . Arthritis Paternal Grandfather     Her Social History Is Significant For: Social History   Socioeconomic History  . Marital status: Legally Separated    Spouse name: Not on file  . Number of children: Not on file  . Years of education: Not on file  . Highest education level: Not on file  Occupational History  . Not on file  Social Needs  . Financial resource strain: Not on file  . Food insecurity    Worry: Not on file    Inability: Not on file  . Transportation needs    Medical: Not on file    Non-medical: Not on file  Tobacco Use  . Smoking status: Never Smoker  . Smokeless tobacco: Never Used   Substance and Sexual Activity  . Alcohol use: No    Alcohol/week: 0.0 standard drinks  . Drug use: No  . Sexual activity: Yes    Partners: Male    Birth control/protection: I.U.D.  Lifestyle  . Physical activity    Days per week: Not on file    Minutes per session: Not on file  . Stress: Not on file  Relationships  . Social Musician on phone: Not on file    Gets together: Not on file    Attends religious service: Not on file    Active member of club or organization: Not on file    Attends meetings of clubs or organizations: Not on file    Relationship status: Not on file  Other Topics Concern  . Not on file  Social History Narrative  . Not on file    Her Allergies Are:  No Known Allergies:   Her Current Medications Are:  Outpatient Encounter Medications as of 03/18/2019  Medication Sig  . albuterol (VENTOLIN HFA) 108 (90 Base) MCG/ACT inhaler Inhale 1 puff into the lungs every 6 (six) hours as needed for wheezing or shortness of breath. Need office visit for additional refills  . busPIRone (BUSPAR) 15 MG tablet Take 1 tablet (15 mg total) by mouth 2 (two) times daily.  . Diclofenac Sodium (PENNSAID) 2 % SOLN Place 1 application onto the skin 2 (two) times daily.  Marland Kitchen escitalopram (LEXAPRO) 20 MG tablet Take 1 tablet (20 mg total) by mouth at bedtime.  . polyethylene glycol (MIRALAX / GLYCOLAX) packet Take 17 g by mouth daily as needed.  . SUMAtriptan (IMITREX) 25 MG tablet Take 1 tablet (25 mg total) by mouth every 2 (two) hours as needed for migraine. No more than  in 24hrs  . tiZANidine (ZANAFLEX) 4 MG capsule Take 1 capsule (4 mg total) by mouth 3 (three) times daily as needed for muscle spasms.  Marland Kitchen topiramate (TOPAMAX) 25 MG capsule Take 1 capsule (25 mg total) by mouth 2 (two) times daily.  . traZODone (DESYREL) 50 MG tablet Take 1 tablet (50 mg total) by mouth at bedtime. Need office visit for additional refills   No facility-administered encounter  medications on file as of 03/18/2019.   :  Review of Systems:  Out of a complete 14 point review of systems, all are reviewed and negative with the exception of these symptoms as listed below: Review of Systems  Neurological:       Pt presents today to discuss her sleep. Pt has never had a sleep study but does endorse snoring.  Epworth Sleepiness Scale 0= would never doze 1= slight chance of dozing 2= moderate chance of dozing 3=  high chance of dozing  Sitting and reading: 3 Watching TV: 2 Sitting inactive in a public place (ex. Theater or meeting): 3 As a passenger in a car for an hour without a break: 3 Lying down to rest in the afternoon: 3 Sitting and talking to someone: 1 Sitting quietly after lunch (no alcohol): 3 In a car, while stopped in traffic: 1 Total: 19     Objective:  Neurological Exam  Physical Exam Physical Examination:   Vitals:   03/18/19 1612  BP: 130/81  Pulse: 60    General Examination: The patient is a very pleasant 28 y.o. female in no acute distress. She appears well-developed and well-nourished and well groomed.   HEENT: Normocephalic, atraumatic, pupils are equal, round and reactive to light. Corrective eyeglasses in place. Extraocular tracking is good without limitation to gaze excursion or nystagmus noted. Normal smooth pursuit is noted. Hearing is grossly intact. Face is symmetric with normal facial animation. Speech is clear with no dysarthria noted. There is no hypophonia. There is no lip, neck/head, jaw or voice tremor. Neck is supple with full range of passive and active motion. There are no carotid bruits on auscultation. Oropharynx exam reveals: mild mouth dryness, good dental hygiene and mild airway crowding, due to smaller.airway entry. Mallampati is class II. Tongue protrudes centrally and palate elevates symmetrically. Tonsils are 1+ in size. Neck size is 15.75 inches. She has a Mild overbite.    Chest: Clear to auscultation without  wheezing, rhonchi or crackles noted.  Heart: S1+S2+0, regular and normal without murmurs, rubs or gallops noted.   Abdomen: Soft, non-tender and non-distended with normal bowel sounds appreciated on auscultation.  Extremities: There is no pitting edema in the distal lower extremities bilaterally. Pedal pulses are intact.  Skin: Warm and dry without trophic changes noted.  Musculoskeletal: exam reveals no obvious joint deformities, tenderness or joint swelling or erythema.   Neurologically:  Mental status: The patient is awake, alert and oriented in all 4 spheres. Her immediate and remote memory, attention, language skills and fund of knowledge are appropriate. There is no evidence of aphasia, agnosia, apraxia or anomia. Speech is clear with normal prosody and enunciation. Thought process is linear. Mood is normal and affect is normal.  Cranial nerves II - XII are as described above under HEENT exam. In addition: shoulder shrug is normal with equal shoulder height noted. Motor exam: Normal bulk, strength and tone is noted. There is no drift, tremor or rebound. Romberg is negative. Fine motor skills and coordination: grossly intact.  Cerebellar testing: No dysmetria or intention tremor. There is no truncal or gait ataxia.  Sensory exam: intact to light touch in the upper and lower extremities.  Gait, station and balance: She stands easily. No veering to one side is noted. No leaning to one side is noted. Posture is age-appropriate and stance is narrow based. Gait shows normal stride length and normal pace. No problems turning are noted. Tandem walk is unremarkable.   Assessment and Plan:  In summary, Ms. Carmen [place] is a 28 year old female with an underlying medical of migraines, anxiety, depression, arthritis, allergies and morbid obesity with a BMI of over 45, who Presents for evaluation of her sleep disorder in particular her excessive daytime somnolence of several years duration, dating back to  high school days.  While her history is not telltale for narcolepsy, she may have an underlying organic hypersomnolence disorder with narcolepsy without cataplexy within the differential diagnosis as well as idiopathic hypersomnolence.  Also  in the differential would be underlying obstructive sleep apnea what with her family history of OSA as well as her history of snoring and morbid obesity. I talked to the patient at length today. The patient is advised to proceed with extended sleep study testing in the form of nocturnal polysomnogram, followed by a next day nap study, called MSLT (mean sleep latency test). I explained the sleep study procedures to the patient. In preparation for sleep study testing she has to be off of any prescription stimulants or psychotropic medications or potentially sedating medications including Topamax, Zanaflex, BuSpar, trazodone, and Lexapro.  She is advised to talk to about tapering off of her trazodone and Lexapro. She would have to be off of these medications for at least 2 weeks prior to sleep study testing. She is advised to keep a set schedule for her sleep in preparation for sleep study testing as well as limit and stabilize caffeine intake. I explained to the patient that should the overnight PSG reveal obstructive sleep apnea we will forego the next day nap study, as we will have to treat the underlying sleep disordered breathing first. I will see her back after sleep testing is completed. I answered all her questions today and she was in agreement with the plan.   Thank you very much for allowing me to participate in the care of this nice patient. If I can be of any further assistance to you please do not hesitate to call me at [phone].  Sincerely,   Star Age, MD, PhD

## 2019-04-15 ENCOUNTER — Telehealth: Payer: Self-pay | Admitting: Obstetrics and Gynecology

## 2019-04-15 NOTE — Telephone Encounter (Signed)
Left message to call Murlean Hark, RN at [facility].

## 2019-04-15 NOTE — Telephone Encounter (Signed)
Patient is asking to see Dr.Jertson for some "hormonal issues". She stated she feels out of control.

## 2019-04-15 NOTE — Telephone Encounter (Signed)
Spoke with pt. Pt states crying and being nervous all the time. Everyone around her is noticing it too. Would like to speak with Dr Talbert Nan about hormone issues. OV scheduled 11/10 at 4:30pm. Routing to provider for final review. Patient is agreeable to disposition. Will close encounter.

## 2019-04-16 ENCOUNTER — Encounter: Payer: Self-pay | Admitting: Obstetrics and Gynecology

## 2019-04-16 ENCOUNTER — Ambulatory Visit: Payer: BC Managed Care – PPO | Admitting: Obstetrics and Gynecology

## 2019-04-16 ENCOUNTER — Other Ambulatory Visit: Payer: Self-pay

## 2019-04-16 VITALS — BP 130/80 | HR 64 | Temp 97.2°F | Wt 294.0 lb

## 2019-04-16 DIAGNOSIS — F3289 Other specified depressive episodes: Secondary | ICD-10-CM

## 2019-04-16 DIAGNOSIS — F3281 Premenstrual dysphoric disorder: Secondary | ICD-10-CM | POA: Diagnosis not present

## 2019-04-16 MED ORDER — BUPROPION HCL ER (XL) 150 MG PO TB24: 150.0000 mg | ORAL_TABLET | Freq: Every day | ORAL | 1 refills | Status: DC

## 2019-04-16 NOTE — Progress Notes (Signed)
GYNECOLOGY  VISIT   HPI: 28 y.o.   Legally Separated White or Caucasian Not Hispanic or Latino  female   G1P1001 with No LMP recorded. (Menstrual status: IUD).   here for consult regarding mood fluctuations with IUD.  She has a mirena IUD, had reinsertion of a new mirena in 10/18.    She previously tried Celexa for just a few days, didn't like how she felt on it. She was given a script for lexapro for severe PMDD in 9/18, which helped. Currently she c/o depression intermittently, can't control her emotions, gets depressed because she can't control her emotions. It controllable most of the month, for 2 weeks a month her symptoms are worse. She isn't having cycles but has breast tenderness and change in d/c indication she is prior to her cycle.  Her boyfriend recently broke up with her because she is too emotional.  Her mood changes started prior to getting her last IUD.   She c/o mid back pain intermittently occurs cyclically as well, during when she thinks she is premenstrual. This started a month ago. Hurt really bad, worse when she moved. Gone in 2 days.   GYNECOLOGIC HISTORY: No LMP recorded. (Menstrual status: IUD). Contraception: IUD  Menopausal hormone therapy: None        OB History    Gravida  1   Para  1   Term  1   Preterm      AB      Living  1     SAB      TAB      Ectopic      Multiple      Live Births  1              Patient Active Problem List   Diagnosis Date Noted  . Excessive daytime sleepiness 03/13/2019  . 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 Acute right ankle pain 10/24/2018  . Morbid (severe) obesity due to excess calories ([place]) 08/24/2018  . GAD (generalized anxiety disorder) 08/24/2018  . Viral URI 08/17/2018  . Acute pain of right knee 07/23/2018  . Chronic right-sided low back pain with right-sided sciatica 05/15/2018  . Moderate episode of recurrent major depressive disorder ([place]) 09/05/2017  . Migraine without aura   .  Arthritis   . Allergy   . Increased bowel frequency 07/12/2016  . Increased urinary frequency 07/12/2016  . Rhinitis, allergic 09/09/2014    Past Medical History:  Diagnosis Date  . Allergy   . Anxiety   . Arthritis   . Migraine without aura     Past Surgical History:  Procedure Laterality Date  . INTRAUTERINE DEVICE (IUD) INSERTION  04/2017   Mirena   . KNEE ARTHROSCOPY Right     Current Outpatient Medications  Medication Sig Dispense Refill  . albuterol (VENTOLIN HFA) 108 (90 Base) MCG/ACT inhaler Inhale 1 puff into the lungs every 6 (six) hours as needed for wheezing or shortness of breath. Need office visit for additional refills 6.7 g 1  . busPIRone (BUSPAR) 15 MG tablet Take 1 tablet (15 mg total) by mouth 2 (two) times daily. 180 tablet 3  . Diclofenac Sodium (PENNSAID) 2 % SOLN Place 1 application onto the skin 2 (two) times daily. 1 Bottle 3  . escitalopram (LEXAPRO) 20 MG tablet Take 1 tablet (20 mg total) by mouth at bedtime. 90 tablet 3  . levonorgestrel (MIRENA) 20 MCG/24HR IUD 1 each by Intrauterine route once.    Marland Kitchen  polyethylene glycol (MIRALAX / GLYCOLAX) packet Take 17 g by mouth daily as needed. 14 each 0  . SUMAtriptan (IMITREX) 25 MG tablet Take 1 tablet (25 mg total) by mouth every 2 (two) hours as needed for migraine. No more than 100mg  in 24hrs 10 tablet 0  . tiZANidine (ZANAFLEX) 4 MG capsule Take 1 capsule (4 mg total) by mouth 3 (three) times daily as needed for muscle spasms. 14 capsule 0  . topiramate (TOPAMAX) 25 MG capsule Take 1 capsule (25 mg total) by mouth 2 (two) times daily. 180 capsule 1  . traZODone (DESYREL) 50 MG tablet Take 1 tablet (50 mg total) by mouth at bedtime. Need office visit for additional refills 90 tablet 1   No current facility-administered medications for this visit.    buspar is taken intermittently  ALLERGIES: Patient has no known allergies.  Family History  Problem Relation Age of Onset  . Diabetes Father   .  Arthritis Father   . Depression Father   . Mental illness Father        PTSD, anxiety  . Arthritis Mother        osteoartthritis  . Fibroids Mother   . Ovarian cysts Mother   . Kidney disease Maternal Grandmother   . Arthritis Maternal Grandmother   . Prostate cancer Maternal Grandfather   . Arthritis Maternal Grandfather   . Arthritis Paternal Grandmother   . Diabetes Paternal Grandfather   . Mental illness Paternal Grandfather   . Arthritis Paternal Grandfather     Social History   Socioeconomic History  . Marital status: Legally Separated    Spouse name: Not on file  . Number of children: Not on file  . Years of education: Not on file  . Highest education level: Not on file  Occupational History  . Not on file  Social Needs  . Financial resource strain: Not on file  . Food insecurity    Worry: Not on file    Inability: Not on file  . Transportation needs    Medical: Not on file    Non-medical: Not on file  Tobacco Use  . Smoking status: Never Smoker  . Smokeless tobacco: Never Used  Substance and Sexual Activity  . Alcohol use: No    Alcohol/week: 0.0 standard drinks  . Drug use: No  . Sexual activity: Yes    Partners: Male    Birth control/protection: I.U.D.  Lifestyle  . Physical activity    Days per week: Not on file    Minutes per session: Not on file  . Stress: Not on file  Relationships  . Social Musicianconnections    Talks on phone: Not on file    Gets together: Not on file    Attends religious service: Not on file    Active member of club or organization: Not on file    Attends meetings of clubs or organizations: Not on file    Relationship status: Not on file  . Intimate partner violence    Fear of current or ex partner: Not on file    Emotionally abused: Not on file    Physically abused: Not on file    Forced sexual activity: Not on file  Other Topics Concern  . Not on file  Social History Narrative  . Not on file    Review of Systems   Constitutional: Positive for malaise/fatigue.  HENT: Negative.   Eyes: Negative.   Respiratory: Negative.   Cardiovascular: Negative.   Gastrointestinal: Negative.  Genitourinary: Negative.   Musculoskeletal: Positive for back pain.  Skin: Negative.   Neurological: Negative.   Endo/Heme/Allergies: Negative.   Psychiatric/Behavioral: Positive for depression.       Hopelessness Irritability    PHYSICAL EXAMINATION:    BP 130/80 (BP Location: Right Arm, Patient Position: Sitting, Cuff Size: Large)   Pulse 64   Temp (!) 97.2 F (36.2 C) (Skin)   Wt 294 lb (133.4 kg)   BMI 46.05 kg/m     General appearance: alert, cooperative and appears stated age   ASSESSMENT Mood changes, worsened premenstrually (she thinks)  On lexapro 20 mg Has a mirena IUD    PLAN Will continue lexapro  Add Wellbutrin 150 mg a day F/U in one month Consider OCP's if her PMDD symptoms persist.    An After Visit Summary was printed and given to the patient.  Over 15 minutes face to face time of which over 50% was spent in counseling.

## 2019-04-16 NOTE — Patient Instructions (Signed)
Living With Depression Everyone experiences occasional disappointment, sadness, and loss in their lives. When you are feeling down, blue, or sad for at least 2 weeks in a row, it may mean that you have depression. Depression can affect your thoughts and feelings, relationships, daily activities, and physical health. It is caused by changes in the way your brain functions. If you receive a diagnosis of depression, your health care provider will tell you which type of depression you have and what treatment options are available to you. If you are living with depression, there are ways to help you recover from it and also ways to prevent it from coming back. How to cope with lifestyle changes Coping with stress     Stress is your body's reaction to life changes and events, both good and bad. Stressful situations may include:  Getting married.  The death of a spouse.  Losing a job.  Retiring.  Having a baby. Stress can last just a few hours or it can be ongoing. Stress can play a major role in depression, so it is important to learn both how to cope with stress and how to think about it differently. Talk with your health care provider or a counselor if you would like to learn more about stress reduction. He or she may suggest some stress reduction techniques, such as:  Music therapy. This can include creating music or listening to music. Choose music that you enjoy and that inspires you.  Mindfulness-based meditation. This kind of meditation can be done while sitting or walking. It involves being aware of your normal breaths, rather than trying to control your breathing.  Centering prayer. This is a kind of meditation that involves focusing on a spiritual word or phrase. Choose a word, phrase, or sacred image that is meaningful to you and that brings you peace.  Deep breathing. To do this, expand your stomach and inhale slowly through your nose. Hold your breath for 3-5 seconds, then exhale  slowly, allowing your stomach muscles to relax.  Muscle relaxation. This involves intentionally tensing muscles then relaxing them. Choose a stress reduction technique that fits your lifestyle and personality. Stress reduction techniques take time and practice to develop. Set aside 5-15 minutes a day to do them. Therapists can offer training in these techniques. The training may be covered by some insurance plans. Other things you can do to manage stress include:  Keeping a stress diary. This can help you learn what triggers your stress and ways to control your response.  Understanding what your limits are and saying no to requests or events that lead to a schedule that is too full.  Thinking about how you respond to certain situations. You may not be able to control everything, but you can control how you react.  Adding humor to your life by watching funny films or TV shows.  Making time for activities that help you relax and not feeling guilty about spending your time this way.  Medicines Your health care provider may suggest certain medicines if he or she feels that they will help improve your condition. Avoid using alcohol and other substances that may prevent your medicines from working properly (may interact). It is also important to:  Talk with your pharmacist or health care provider about all the medicines that you take, their possible side effects, and what medicines are safe to take together.  Make it your goal to take part in all treatment decisions (shared decision-making). This includes giving input on  the side effects of medicines. It is best if shared decision-making with your health care provider is part of your total treatment plan. If your health care provider prescribes a medicine, you may not notice the full benefits of it for 4-8 weeks. Most people who are treated for depression need to be on medicine for at least 6-12 months after they feel better. If you are taking  medicines as part of your treatment, do not stop taking medicines without first talking to your health care provider. You may need to have the medicine slowly decreased (tapered) over time to decrease the risk of harmful side effects. Relationships Your health care provider may suggest family therapy along with individual therapy and drug therapy. While there may not be family problems that are causing you to feel depressed, it is still important to make sure your family learns as much as they can about your mental health. Having your family's support can help make your treatment successful. How to recognize changes in your condition Everyone has a different response to treatment for depression. Recovery from major depression happens when you have not had signs of major depression for two months. This may mean that you will start to:  Have more interest in doing activities.  Feel less hopeless than you did 2 months ago.  Have more energy.  Overeat less often, or have better or improving appetite.  Have better concentration. Your health care provider will work with you to decide the next steps in your recovery. It is also important to recognize when your condition is getting worse. Watch for these signs:  Having fatigue or low energy.  Eating too much or too little.  Sleeping too much or too little.  Feeling restless, agitated, or hopeless.  Having trouble concentrating or making decisions.  Having unexplained physical complaints.  Feeling irritable, angry, or aggressive. Get help as soon as you or your family members notice these symptoms coming back. How to get support and help from others How to talk with friends and family members about your condition  Talking to friends and family members about your condition can provide you with one way to get support and guidance. Reach out to trusted friends or family members, explain your symptoms to them, and let them know that you are  working with a health care provider to treat your depression. Financial resources Not all insurance plans cover mental health care, so it is important to check with your insurance carrier. If paying for co-pays or counseling services is a problem, search for a local or county mental health care center. They may be able to offer public mental health care services at low or no cost when you are not able to see a private health care provider. If you are taking medicine for depression, you may be able to get the generic form, which may be less expensive. Some makers of prescription medicines also offer help to patients who cannot afford the medicines they need. Follow these instructions at home:   Get the right amount and quality of sleep.  Cut down on using caffeine, tobacco, alcohol, and other potentially harmful substances.  Try to exercise, such as walking or lifting small weights.  Take over-the-counter and prescription medicines only as told by your health care provider.  Eat a healthy diet that includes plenty of vegetables, fruits, whole grains, low-fat dairy products, and lean protein. Do not eat a lot of foods that are high in solid fats, added sugars, or salt.  Keep all follow-up visits as told by your health care provider. This is important. Contact a health care provider if:  You stop taking your antidepressant medicines, and you have any of these symptoms: ? Nausea. ? Headache. ? Feeling lightheaded. ? Chills and body aches. ? Not being able to sleep (insomnia).  You or your friends and family think your depression is getting worse. Get help right away if:  You have thoughts of hurting yourself or others. If you ever feel like you may hurt yourself or others, or have thoughts about taking your own life, get help right away. You can go to your nearest emergency department or call:  Your local emergency services (911 in the [place]  A suicide crisis helpline, such as the  [place] at (501)385-8235. This is open 24-hours a day. Summary  If you are living with depression, there are ways to help you recover from it and also ways to prevent it from coming back.  Work with your health care team to create a management plan that includes counseling, stress management techniques, and healthy lifestyle habits. This information is not intended to replace advice given to you by your health care provider. Make sure you discuss any questions you have with your health care provider. Document Released: 04/25/2016 Document Revised: 09/14/2018 Document Reviewed: 04/25/2016 Elsevier Patient Education  [employer]. Premenstrual Syndrome Premenstrual syndrome (PMS) is a group of physical, emotional, and behavioral symptoms that affect women of childbearing age as part of their menstrual cycle. PMS starts 1-2 weeks before the start of a woman's menstrual period and goes away a few days after menstrual bleeding starts. It often happens in a predictable pattern (recurs). PMS may cause other health conditions to become worse, such as asthma, allergies, and migraines. PMS can range from mild to severe. When it is severe, it is called premenstrual dysphoric disorder (PMDD). PMS may interfere with normal daily activities. What are the causes? The cause of this condition is not known, but it seems to be related to hormone changes that happen before menstruation. What are the signs or symptoms? Symptoms of this condition often happen every month. They go away completely after your period starts. Physical symptoms of this condition include:  Bloating.  Breast pain.  Headaches.  Extreme fatigue.  Backaches.  Swelling of the hands and feet.  Weight gain.  Hot flashes. Emotional and behavioral symptoms of this condition include:  Mood swings.  Depression.  Angry outbursts.  Irritability.  Anxiety.  Crying spells.  Food cravings or  appetite changes.  Changes in sexual desire.  Confusion.  Aggression.  Social withdrawal.  Poor concentration. How is this diagnosed? This condition may be diagnosed based on a history of your symptoms. This condition is generally diagnosed if symptoms of PMS:  Are present in the 5 days before your period starts.  End within 4 days after your period starts.  Happen at least 3 months in a row.  Interfere with some of your normal activities. Other conditions that can cause some of these symptoms must be ruled out before PMS can be diagnosed. These include depression, anxiety, anemia, and thyroid problems. How is this treated? This condition may be treated by:  Maintaining a healthy lifestyle. This includes eating a well-balanced diet and exercising regularly.  Taking medicines. Medicines can help relieve symptoms such as cramps, aches, pains, headaches, and breast tenderness. Depending on the severity of the condition, your health care provider may recommend various over-the-counter pain  medicines. Follow these instructions at home: Eating and drinking   Eat a well-balanced diet.  Avoid caffeine and alcohol.  Limit the amount of salt and salty foods you eat. This will help reduce bloating.  Drink enough fluid to keep your urine pale yellow.  Take a multivitamin if told to do so by your health care provider. Lifestyle   Do not use any products that contain nicotine or tobacco, such as cigarettes, e-cigarettes, and chewing tobacco. If you need help quitting, ask your health care provider.  Exercise regularly as suggested by your health care provider.  Get enough sleep. For most adults, this is 7-8 hours of sleep each night.  Practice relaxation techniques such as yoga, tai chi, or meditation.  Find healthy ways to manage stress. General instructions   For 2-3 months, write down your symptoms, their severity, and how long they last. This will help your health care  provider choose the best treatment for you.  Take over-the-counter and prescription medicines only as told by your health care provider.  If you are using birth control pills (oral contraceptives), use them as told by your health care provider. Contact a health care provider if:  Your symptoms get worse.  You develop new symptoms.  You have trouble doing your daily activities. Summary  Premenstrual syndrome (PMS) is a group of physical, emotional, and behavioral symptoms that affect women of childbearing age.  PMS starts 1-2 weeks before the start of a woman's period and goes away a few days after the period starts.  PMS is treated by maintaining a healthy lifestyle and taking medicines to relieve the symptoms. This information is not intended to replace advice given to you by your health care provider. Make sure you discuss any questions you have with your health care provider. Document Released: 05/20/2000 Document Revised: 01/03/2018 Document Reviewed: 01/03/2018 Elsevier Patient Education  2020 [employer].

## 2019-04-17 ENCOUNTER — Telehealth: Payer: Self-pay

## 2019-04-17 NOTE — Telephone Encounter (Signed)
We have attempted to call the patient two times to schedule sleep study.  Patient has been unavailable at the phone numbers we have on file and has not returned our calls. If patient calls back we will schedule them for their sleep study.  

## 2019-04-19 ENCOUNTER — Encounter: Payer: Self-pay | Admitting: Nurse Practitioner

## 2019-04-19 ENCOUNTER — Ambulatory Visit: Payer: BC Managed Care – PPO | Admitting: Nurse Practitioner

## 2019-04-19 ENCOUNTER — Other Ambulatory Visit: Payer: Self-pay

## 2019-04-19 VITALS — BP 112/84 | HR 81 | Temp 97.4°F | Ht 67.0 in | Wt 295.0 lb

## 2019-04-19 DIAGNOSIS — M545 Low back pain: Secondary | ICD-10-CM | POA: Diagnosis not present

## 2019-04-19 DIAGNOSIS — F331 Major depressive disorder, recurrent, moderate: Secondary | ICD-10-CM | POA: Diagnosis not present

## 2019-04-19 DIAGNOSIS — R8290 Unspecified abnormal findings in urine: Secondary | ICD-10-CM

## 2019-04-19 DIAGNOSIS — F411 Generalized anxiety disorder: Secondary | ICD-10-CM

## 2019-04-19 DIAGNOSIS — G8929 Other chronic pain: Secondary | ICD-10-CM

## 2019-04-19 NOTE — Patient Instructions (Addendum)
No bacteria in urine Continue current medications  Contact [facility] for appt.  You will be contacted to schedule appt for PT. for back pain, continue naproxen ad muscle relaxant as needed.  After email collaboration with Dr. Rexene Alberts; her sleep study will be changed to a polysomnogram test to rule out obstructive sleep apnea. With this test she will not need to discontinue lexapro and buspar. She will need to hold trazodone for 48hrs prior to this test. If this test is negative for obstructive sleep apnea, then she will need another test to evaluate for daytime hypersomnolence. With this test she will need to stop lexapro and buspar. If we get to this step, I will need to refer her to psychiatry to provide guidance about her medications.

## 2019-04-19 NOTE — Progress Notes (Signed)
Subjective:  Patient ID: Carmen Wall, female    DOB: February 20, 1991  Age: 28 y.o. MRN: [id number]  CC: Follow-up (follow up on wieght loss/gatric sleeve/med/never got a call from weight loss referral. )  HPI  Anxiety and Depression: Stable mood with lexapro, trazodone and buspar. Reports worsening mood around menstrual cycle. GYN added wellbutrin 3days ago for premenstrual dysphoric disorder. She is to f/up in 70month to discuss possible change in contraception? (IUD to [facility]). She denies any adverse side effects with use of Wellbutrin and lexapro.  Migraines headaches: Improved with use of topamax BID and imitrex prn (not used since 02/2019) She was evaluate by neurology: Dr. Rexene Alberts who recommended  Discontinuation of SSRI, trazodone, and topamax prior to sleep study.  Chronic lower back pain: Intermittent, worse during menstrual cycle. Improves with use of NSAIDs and muscle relaxant Onset 2018, x-ray lumbar spine and Ct Abd/pelvis (no abnormal finding). Pelvic US completed 02/2018: normal IUD placement. She denies any back injury, no LE weakness, no change in GI/GU function.  She has been under care of chiropractor in past with minimal improvement.  Depression screen [facility] 2/9 03/13/2019 08/24/2018 05/15/2018  Decreased Interest 0 1 1  Down, Depressed, Hopeless 0 0 2  PHQ - 2 Score 0 1 3  Altered sleeping 2 2 3   Tired, decreased energy 2 2 3   Change in appetite 2 2 3   Feeling bad or failure about yourself  0 0 2  Trouble concentrating 0 0 2  Moving slowly or fidgety/restless 1 1 3   Suicidal thoughts 0 0 1  PHQ-9 Score 7 8 20   Difficult doing work/chores - - -   GAD 7 : Generalized Anxiety Score 03/13/2019 08/24/2018 05/15/2018 03/14/2018  Nervous, Anxious, on Edge 1 1 3 3   Control/stop worrying 1 0 3 3  Worry too much - different things 1 0 3 3  Trouble relaxing 0 1 2 3   Restless 0 0 2 1  Easily annoyed or irritable 1 1 3 3   Afraid - awful might happen 1 0 2 2  Total GAD 7  Score 5 3 18 18      Reviewed past Medical, Social and Family history today.  Outpatient Medications Prior to Visit  Medication Sig Dispense Refill   albuterol (VENTOLIN HFA) 108 (90 Base) MCG/ACT inhaler Inhale 1 puff into the lungs every 6 (six) hours as needed for wheezing or shortness of breath. Need office visit for additional refills 6.7 g 1   buPROPion (WELLBUTRIN XL) 150 MG 24 hr tablet Take 1 tablet (150 mg total) by mouth daily. 30 tablet 1   busPIRone (BUSPAR) 15 MG tablet Take 1 tablet (15 mg total) by mouth 2 (two) times daily. 180 tablet 3   escitalopram (LEXAPRO) 20 MG tablet Take 1 tablet (20 mg total) by mouth at bedtime. 90 tablet 3   levonorgestrel (MIRENA) 20 MCG/24HR IUD 1 each by Intrauterine route once.     polyethylene glycol (MIRALAX / GLYCOLAX) packet Take 17 g by mouth daily as needed. 14 each 0   SUMAtriptan (IMITREX) 25 MG tablet Take 1 tablet (25 mg total) by mouth every 2 (two) hours as needed for migraine. No more than 100mg  in 24hrs 10 tablet 0   tiZANidine (ZANAFLEX) 4 MG capsule Take 1 capsule (4 mg total) by mouth 3 (three) times daily as needed for muscle spasms. 14 capsule 0   topiramate (TOPAMAX) 25 MG capsule Take 1 capsule (25 mg total) by mouth 2 (two) times daily.  180 capsule 1   traZODone (DESYREL) 50 MG tablet Take 1 tablet (50 mg total) by mouth at bedtime. Need office visit for additional refills 90 tablet 1   Diclofenac Sodium (PENNSAID) 2 % SOLN Place 1 application onto the skin 2 (two) times daily. (Patient not taking: Reported on 04/19/2019) 1 Bottle 3   No facility-administered medications prior to visit.     ROS See HPI  Objective:  BP 112/84    Pulse 81    Temp (!) 97.4 F (36.3 C) (Tympanic)    Ht 5\' 7"  (1.702 m)    Wt 295 lb (133.8 kg)    SpO2 99%    BMI 46.20 kg/m   BP Readings from Last 3 Encounters:  04/19/19 112/84  04/16/19 130/80  03/18/19 130/81    Wt Readings from Last 3 Encounters:  04/19/19 295 lb (133.8  kg)  04/16/19 294 lb (133.4 kg)  03/18/19 294 lb (133.4 kg)   Physical Exam Vitals signs reviewed.  Cardiovascular:     Rate and Rhythm: Normal rate and regular rhythm.     Pulses: Normal pulses.     Heart sounds: Normal heart sounds.  Pulmonary:     Effort: Pulmonary effort is normal.     Breath sounds: Normal breath sounds.  Neurological:     Mental Status: She is alert and oriented to person, place, and time.     Gait: Gait normal.  Psychiatric:        Mood and Affect: Mood normal.        Behavior: Behavior normal.        Thought Content: Thought content normal.    Lab Results  Component Value Date   WBC 5.5 02/06/2018   HGB 13.3 02/06/2018   HCT 40.0 02/06/2018   PLT 232 02/06/2018   GLUCOSE 85 08/24/2018   CHOL 163 08/24/2018   TRIG 105.0 08/24/2018   HDL 36.50 (L) 08/24/2018   LDLCALC 106 (H) 08/24/2018   ALT 19 08/24/2018   AST 17 08/24/2018   NA 140 08/24/2018   K 3.9 08/24/2018   CL 106 08/24/2018   CREATININE 0.73 08/24/2018   BUN 9 08/24/2018   CO2 26 08/24/2018   TSH 1.41 08/24/2018   HGBA1C 4.9 09/05/2017    Ct Abdomen Pelvis W Contrast  Result Date: 05/12/2017 CLINICAL DATA:  Lower abdominal pain radiates to low back. Associated with nausea diarrhea and chills. EXAM: CT ABDOMEN AND PELVIS WITH CONTRAST TECHNIQUE: Multidetector CT imaging of the abdomen and pelvis was performed using the standard protocol following bolus administration of intravenous contrast. CONTRAST:  14/12/2016 ISOVUE-300 IOPAMIDOL (ISOVUE-300) INJECTION 61% COMPARISON:  07/25/2015 FINDINGS: Lower chest:  Unremarkable. Hepatobiliary: No focal abnormality within the liver parenchyma. There is no evidence for gallstones, gallbladder wall thickening, or pericholecystic fluid. No intrahepatic or extrahepatic biliary dilation. Pancreas: No focal mass lesion. No dilatation of the main duct. No intraparenchymal cyst. No peripancreatic edema. Spleen: 10 mm hypoattenuating subcapsular lesion in the  posterior spleen and is likely a small cyst or pseudocyst. Adrenals/Urinary Tract: No adrenal nodule or mass. Kidneys are unremarkable. No evidence for hydroureter. The urinary bladder appears normal for the degree of distention. Stomach/Bowel: Stomach is nondistended. No gastric wall thickening. No evidence of outlet obstruction. Duodenum is normally positioned as is the ligament of Treitz. No small bowel wall thickening. No small bowel dilatation. The terminal ileum is normal. No gross colonic mass. No colonic wall thickening. No substantial diverticular change. Vascular/Lymphatic: There is no gastrohepatic or  hepatoduodenal ligament lymphadenopathy. No intraperitoneal or retroperitoneal lymphadenopathy. No abdominal aortic aneurysm. No abdominal aortic atherosclerotic calcification. No pelvic sidewall lymphadenopathy. Reproductive: IUD is visualized in the uterus. There is no adnexal mass. Other: Trace free fluid is evident in the cul-de-sac. Musculoskeletal: Bone windows reveal no worrisome lytic or sclerotic osseous lesions. IMPRESSION: 1. Unremarkable exam with no acute findings in the abdomen or pelvis. Specifically, no findings to explain the patient's history of lower abdominal pain radiating to the back. No GI tract abnormality to account for the nausea and diarrhea. Electronically Signed   By: Kennith CenterEric  Mansell M.D.   On: 05/12/2017 16:40    Assessment & Plan:   [name] was seen today for follow-up.  Diagnoses and all orders for this visit:  Moderate episode of recurrent major depressive disorder (HCC)  Chronic bilateral low back pain without sciatica -     Ambulatory referral to Physical Therapy -     Urinalysis w microscopic + reflex cultur  Abnormal finding on urinalysis -     Urine Culture -     REFLEXIVE URINE CULTURE  GAD (generalized anxiety disorder)   I have discontinued Monetta M. Ireland's Diclofenac Sodium. I am also having her maintain her polyethylene glycol,  albuterol, busPIRone, escitalopram, traZODone, tiZANidine, SUMAtriptan, topiramate, levonorgestrel, and buPROPion.  No orders of the defined types were placed in this encounter.   Problem List Items Addressed This Visit      Nervous and Auditory   Chronic right-sided low back pain with right-sided sciatica    Intermittent, worse during menstrual cycle. Improves with use of NSAIDs and muscle relaxant Onset 2018, x-ray lumbar spine and Ct Abd/pelvis (no abnormal finding). Pelvic US completed 02/2018: normal IUD placement. She denies any back injury, no LE weakness, no change in GI/GU function.  She has been under care of chiropractor in past with minimal improvement.  Entered referral to PT. Advised about need for weight loss through diet and exercise. She is to schedule appt with nutritionist. I advised against gastric bypass surgery at this time. Advised to complete work up with Dr. Frances FurbishAthar first (rule out OSA).        Other   GAD (generalized anxiety disorder)   Moderate episode of recurrent major depressive disorder (HCC) - Primary    After email collaboration with Dr. Frances FurbishAthar; her sleep study will be changed to a polysomnogram test to rule out obstructive sleep apnea. With this test she will not need to discontinue lexapro and buspar. She will need to hold trazodone for 48hrs prior to this test. If this test is negative for obstructive sleep apnea, then she will need another test to evaluate for daytime hypersomnolence. With this test she will need to stop lexapro and buspar. If we get to this step, I will need to refer her to psychiatry to provide guidance about her medications.  F/up in 3months       Other Visit Diagnoses    Abnormal finding on urinalysis       Relevant Orders   Urine Culture (Completed)   REFLEXIVE URINE CULTURE (Completed)       Follow-up: Return in 3 months (on 07/20/2019) for Anxiety and Depression (30mins, complete PHQ form).  Alysia Pennaharlotte Makaylee Spielberg, NP

## 2019-04-21 LAB — URINE CULTURE
MICRO NUMBER:: 1102776
SPECIMEN QUALITY:: ADEQUATE

## 2019-04-21 LAB — URINALYSIS W MICROSCOPIC + REFLEX CULTURE
Bacteria, UA: NONE SEEN /HPF
Bilirubin Urine: NEGATIVE
Glucose, UA: NEGATIVE
Hgb urine dipstick: NEGATIVE
Hyaline Cast: NONE SEEN /LPF
Ketones, ur: NEGATIVE
Nitrites, Initial: NEGATIVE
Protein, ur: NEGATIVE
RBC / HPF: NONE SEEN /HPF (ref 0–2)
Specific Gravity, Urine: 1.02 (ref 1.001–1.03)
pH: 8 (ref 5.0–8.0)

## 2019-04-21 LAB — CULTURE INDICATED

## 2019-04-22 ENCOUNTER — Telehealth: Payer: Self-pay | Admitting: Neurology

## 2019-04-22 DIAGNOSIS — G4719 Other hypersomnia: Secondary | ICD-10-CM

## 2019-04-22 DIAGNOSIS — Z820 Family history of epilepsy and other diseases of the nervous system: Secondary | ICD-10-CM

## 2019-04-22 DIAGNOSIS — R0683 Snoring: Secondary | ICD-10-CM

## 2019-04-22 NOTE — Telephone Encounter (Signed)
As per email conversation with Wilfred Lacy, NP, it will not be feasible for the pt to come off the Antidepressant in order to proceed with PSG/MSLT.  We therefore agreed to pursue a baseline sleep study.  I will change the order to a baseline sleep study to evaluate her night time sleep and help r/o OSA. Meagan, can you submit to insurance and let patient know?

## 2019-04-23 ENCOUNTER — Encounter: Payer: Self-pay | Admitting: Nurse Practitioner

## 2019-04-23 ENCOUNTER — Telehealth: Payer: Self-pay | Admitting: Nurse Practitioner

## 2019-04-23 NOTE — Telephone Encounter (Signed)
LVM for the pt to call back, need to inform message below, ok for triage nurse to give detail message.

## 2019-04-23 NOTE — Assessment & Plan Note (Signed)
After email collaboration with Dr. Rexene Alberts; her sleep study will be changed to a polysomnogram test to rule out obstructive sleep apnea. With this test she will not need to discontinue lexapro and buspar. She will need to hold trazodone for 48hrs prior to this test. If this test is negative for obstructive sleep apnea, then she will need another test to evaluate for daytime hypersomnolence. With this test she will need to stop lexapro and buspar. If we get to this step, I will need to refer her to psychiatry to provide guidance about her medications.  F/up in 6months

## 2019-04-23 NOTE — Telephone Encounter (Signed)
Inform Ms. [place] that after collaboration with Dr. Rexene Alberts; her sleep study will be changed to a polysomnogram test to rule out obstructive sleep apnea. With this test she will not need to discontinue lexapro and buspar. She will need to hold trazodone for 48hrs prior to this test. If this test is negative for obstructive sleep apnea, then she will need another test to evaluate for daytime hypersomnolence. With this test she will need to stop lexapro and buspar. If we get to this step, I will need to refer her to psychiatry to provide guidance about her medications. If you have questions about sleep study tests, please reach out to Dr. Rexene Alberts.

## 2019-04-23 NOTE — Assessment & Plan Note (Signed)
Intermittent, worse during menstrual cycle. Improves with use of NSAIDs and muscle relaxant Onset 2018, x-ray lumbar spine and Ct Abd/pelvis (no abnormal finding). Pelvic US completed 02/2018: normal IUD placement. She denies any back injury, no LE weakness, no change in GI/GU function.  She has been under care of chiropractor in past with minimal improvement.  Entered referral to PT. Advised about need for weight loss through diet and exercise. She is to schedule appt with nutritionist. I advised against gastric bypass surgery at this time. Advised to complete work up with Dr. Rexene Alberts first (rule out OSA).

## 2019-04-25 NOTE — Telephone Encounter (Signed)
Left another vm for the pt to call back, need to inform this message and test result.

## 2019-05-06 ENCOUNTER — Encounter: Payer: Self-pay | Admitting: Nurse Practitioner

## 2019-05-09 ENCOUNTER — Telehealth: Payer: Self-pay

## 2019-05-09 NOTE — Telephone Encounter (Signed)
We have attempted to call the patient three times to schedule sleep study.  Patient has been unavailable at the phone numbers we have on file and has not returned our calls.  If patient calls back we will schedule them for their sleep study.  

## 2019-05-20 ENCOUNTER — Encounter: Payer: Self-pay | Admitting: Nurse Practitioner

## 2019-05-20 ENCOUNTER — Other Ambulatory Visit: Payer: Self-pay

## 2019-05-20 ENCOUNTER — Ambulatory Visit (INDEPENDENT_AMBULATORY_CARE_PROVIDER_SITE_OTHER): Payer: BC Managed Care – PPO | Admitting: Nurse Practitioner

## 2019-05-20 VITALS — Temp 96.2°F | Ht 67.0 in | Wt 288.0 lb

## 2019-05-20 DIAGNOSIS — R6889 Other general symptoms and signs: Secondary | ICD-10-CM | POA: Diagnosis not present

## 2019-05-20 MED ORDER — ACETAMINOPHEN ER 650 MG PO TBCR: 650.0000 mg | EXTENDED_RELEASE_TABLET | Freq: Three times a day (TID) | ORAL | Status: DC | PRN

## 2019-05-20 MED ORDER — GUAIFENESIN-DM 100-10 MG/5ML PO SYRP: 5.0000 mL | ORAL_SOLUTION | ORAL | 0 refills | Status: DC | PRN

## 2019-05-20 MED ORDER — BENZONATATE 100 MG PO CAPS: 100.0000 mg | ORAL_CAPSULE | Freq: Three times a day (TID) | ORAL | 0 refills | Status: DC | PRN

## 2019-05-20 MED ORDER — PEPTO-BISMOL 524 MG/30ML PO SUSP: 30.0000 mL | Freq: Four times a day (QID) | ORAL | 0 refills | Status: DC | PRN

## 2019-05-20 NOTE — Patient Instructions (Signed)
Go for COVID test as scheduled. Maintain adequate oral hydration. Worknote provided. You can access that through mychart. If symptoms worsen, go to ED.  This information is directly available on the CDC website: [email or web address].co.za.html    Source:CDC Reference to specific commercial products, manufacturers, companies, or trademarks does not constitute its endorsement or recommendation by the [place], [place], or Centers for [employer] and Prevention.

## 2019-05-20 NOTE — Progress Notes (Signed)
Virtual Visit via Video Note  I connected with Carmen Wall on 05/20/19 at 11:00 AM EST by a video enabled telemedicine application and verified that I am speaking with the correct person using two identifiers.  Location: Patient: home Provider: office Participants: provider and patient I discussed the limitations of evaluation and management by telemedicine and the availability of in person appointments. The patient expressed understanding and agreed to proceed.  CC: pt is c/o deep cough with clear mucus,had headache 4 days last week,bodyache,diarrhea,weakness,abd cramping,hot and cold/going on 1 wk/no otc/FYI-pt works with confirm covid dx--going to get test today at 12:45  History of Present Illness: Cough This is a new problem. The current episode started in the past 7 days. The problem has been unchanged. The cough is non-productive. Associated symptoms include chills, headaches, myalgias, nasal congestion, postnasal drip, rhinorrhea and a sore throat. Pertinent negatives include no chest pain, ear congestion, ear pain, fever, heartburn, hemoptysis, shortness of breath, sweats, weight loss or wheezing. Nothing aggravates the symptoms. She has tried nothing for the symptoms. There is no history of asthma, bronchiectasis, bronchitis, COPD, emphysema, environmental allergies or pneumonia.  Diarrhea  This is a new problem. The current episode started in the past 7 days. The problem occurs 2 to 4 times per day. The problem has been unchanged. The stool consistency is described as watery. The patient states that diarrhea does not awaken her from sleep. Associated symptoms include abdominal pain, bloating, chills, coughing, headaches, myalgias and a URI. Pertinent negatives include no arthralgias, fever, increased  flatus, sweats, vomiting or weight loss. Nothing aggravates the symptoms. Risk factors include ill contacts. She has tried increased fluids for the symptoms. The treatment provided  no relief. Her past medical history is significant for irritable bowel syndrome. There is no history of bowel resection, inflammatory bowel disease, malabsorption, a recent abdominal surgery or short gut syndrome.   Observations/Objective: Physical Exam  Constitutional: She is oriented to person, place, and time. No distress.  Pulmonary/Chest: Effort normal.  Neurological: She is alert and oriented to person, place, and time.  Skin: Skin is dry.  limited exam due to video call. Patient is Unable to provide any vital signs  Assessment and Plan: Carmen Wall was seen today for cough.  Diagnoses and all orders for this visit:  Flu-like symptoms -     benzonatate (TESSALON) 100 MG capsule; Take 1 capsule (100 mg total) by mouth 3 (three) times daily as needed for cough. -     guaiFENesin-dextromethorphan (ROBITUSSIN DM) 100-10 MG/5ML syrup; Take 5 mLs by mouth every 4 (four) hours as needed for cough. -     bismuth subsalicylate (PEPTO-BISMOL) 262 MG/15ML suspension; Take 30 mLs by mouth every 6 (six) hours as needed. -     acetaminophen (TYLENOL 8 HOUR) 650 MG CR tablet; Take 1 tablet (650 mg total) by mouth every 8 (eight) hours as needed for pain.   Follow Up Instructions: See avs   I discussed the assessment and treatment plan with the patient. The patient was provided an opportunity to ask questions and all were answered. The patient agreed with the plan and demonstrated an understanding of the instructions.   The patient was advised to call back or seek an in-person evaluation if the symptoms worsen or if the condition fails to improve as anticipated.  Wilfred Lacy, NP

## 2019-05-22 ENCOUNTER — Ambulatory Visit: Payer: BC Managed Care – PPO | Admitting: Obstetrics and Gynecology

## 2019-05-22 ENCOUNTER — Encounter: Payer: Self-pay | Admitting: Nurse Practitioner

## 2019-06-12 ENCOUNTER — Other Ambulatory Visit: Payer: Self-pay | Admitting: *Deleted

## 2019-06-12 NOTE — Telephone Encounter (Signed)
Message left to return call to [facility] at [phone].   Patient needs to schedule aex/1 month follow up for refills of bupropion.

## 2019-06-13 MED ORDER — BUPROPION HCL ER (XL) 150 MG PO TB24: 150.0000 mg | ORAL_TABLET | Freq: Every day | ORAL | 0 refills | Status: DC

## 2019-06-13 NOTE — Telephone Encounter (Signed)
Medication refill request: bupropion  Last OV:  04-16-2019 JJ  Next AEX: left message for patient to call and schedule  Last MMG (if hormonal medication request): n/a Refill authorized: Today, please advise.   Medication pended for #30, 0RF. Please refill if appropriate.

## 2019-07-03 ENCOUNTER — Ambulatory Visit (INDEPENDENT_AMBULATORY_CARE_PROVIDER_SITE_OTHER): Payer: BC Managed Care – PPO | Admitting: Family Medicine

## 2019-07-03 ENCOUNTER — Other Ambulatory Visit: Payer: Self-pay

## 2019-07-03 ENCOUNTER — Encounter (INDEPENDENT_AMBULATORY_CARE_PROVIDER_SITE_OTHER): Payer: Self-pay | Admitting: Family Medicine

## 2019-07-03 VITALS — BP 123/74 | HR 63 | Temp 98.0°F | Ht 66.0 in | Wt 282.0 lb

## 2019-07-03 DIAGNOSIS — Z0289 Encounter for other administrative examinations: Secondary | ICD-10-CM

## 2019-07-03 DIAGNOSIS — R5383 Other fatigue: Secondary | ICD-10-CM

## 2019-07-03 DIAGNOSIS — Z9189 Other specified personal risk factors, not elsewhere classified: Secondary | ICD-10-CM

## 2019-07-03 DIAGNOSIS — F418 Other specified anxiety disorders: Secondary | ICD-10-CM

## 2019-07-03 DIAGNOSIS — R0602 Shortness of breath: Secondary | ICD-10-CM | POA: Diagnosis not present

## 2019-07-03 DIAGNOSIS — G43009 Migraine without aura, not intractable, without status migrainosus: Secondary | ICD-10-CM

## 2019-07-03 DIAGNOSIS — Z6841 Body Mass Index (BMI) 40.0 and over, adult: Secondary | ICD-10-CM

## 2019-07-03 DIAGNOSIS — E7849 Other hyperlipidemia: Secondary | ICD-10-CM | POA: Diagnosis not present

## 2019-07-04 LAB — HEMOGLOBIN A1C
Est. average glucose Bld gHb Est-mCnc: 94 mg/dL
Hgb A1c MFr Bld: 4.9 % (ref 4.8–5.6)

## 2019-07-04 LAB — COMPREHENSIVE METABOLIC PANEL
ALT: 14 IU/L (ref 0–32)
AST: 15 IU/L (ref 0–40)
Albumin/Globulin Ratio: 1.7 (ref 1.2–2.2)
Albumin: 4.3 g/dL (ref 3.9–5.0)
Alkaline Phosphatase: 64 IU/L (ref 39–117)
BUN/Creatinine Ratio: 18 (ref 9–23)
BUN: 14 mg/dL (ref 6–20)
Bilirubin Total: 0.5 mg/dL (ref 0.0–1.2)
CO2: 23 mmol/L (ref 20–29)
Calcium: 9.3 mg/dL (ref 8.7–10.2)
Chloride: 103 mmol/L (ref 96–106)
Creatinine, Ser: 0.79 mg/dL (ref 0.57–1.00)
GFR calc Af Amer: 116 mL/min/{1.73_m2} (ref >59)
GFR calc non Af Amer: 101 mL/min/{1.73_m2} (ref >59)
Globulin, Total: 2.6 g/dL (ref 1.5–4.5)
Glucose: 84 mg/dL (ref 65–99)
Potassium: 4.3 mmol/L (ref 3.5–5.2)
Sodium: 140 mmol/L (ref 134–144)
Total Protein: 6.9 g/dL (ref 6.0–8.5)

## 2019-07-04 LAB — INSULIN, RANDOM: INSULIN: 16.9 u[IU]/mL (ref 2.6–24.9)

## 2019-07-04 LAB — FOLATE: Folate: 7.5 ng/mL (ref >3.0)

## 2019-07-04 LAB — TSH: TSH: 0.655 u[IU]/mL (ref 0.450–4.500)

## 2019-07-04 LAB — CBC WITH DIFFERENTIAL/PLATELET
Basophils Absolute: 0 10*3/uL (ref 0.0–0.2)
Basos: 1 %
EOS (ABSOLUTE): 0.1 10*3/uL (ref 0.0–0.4)
Eos: 1 %
Hematocrit: 41.4 % (ref 34.0–46.6)
Hemoglobin: 14.4 g/dL (ref 11.1–15.9)
Immature Grans (Abs): 0 10*3/uL (ref 0.0–0.1)
Immature Granulocytes: 0 %
Lymphocytes Absolute: 1.2 10*3/uL (ref 0.7–3.1)
Lymphs: 25 %
MCH: 32.1 pg (ref 26.6–33.0)
MCHC: 34.8 g/dL (ref 31.5–35.7)
MCV: 92 fL (ref 79–97)
Monocytes Absolute: 0.4 10*3/uL (ref 0.1–0.9)
Monocytes: 8 %
Neutrophils Absolute: 3.3 10*3/uL (ref 1.4–7.0)
Neutrophils: 65 %
Platelets: 189 10*3/uL (ref 150–450)
RBC: 4.49 x10E6/uL (ref 3.77–5.28)
RDW: 12.8 % (ref 11.7–15.4)
WBC: 5 10*3/uL (ref 3.4–10.8)

## 2019-07-04 LAB — LIPID PANEL WITH LDL/HDL RATIO
Cholesterol, Total: 227 mg/dL — ABNORMAL HIGH (ref 100–199)
HDL: 45 mg/dL (ref >39)
LDL Chol Calc (NIH): 158 mg/dL — ABNORMAL HIGH (ref 0–99)
LDL/HDL Ratio: 3.5 ratio — ABNORMAL HIGH (ref 0.0–3.2)
Triglycerides: 133 mg/dL (ref 0–149)
VLDL Cholesterol Cal: 24 mg/dL (ref 5–40)

## 2019-07-04 LAB — T3: T3, Total: 125 ng/dL (ref 71–180)

## 2019-07-04 LAB — VITAMIN D 25 HYDROXY (VIT D DEFICIENCY, FRACTURES): Vit D, 25-Hydroxy: 15.5 ng/mL — ABNORMAL LOW (ref 30.0–100.0)

## 2019-07-04 LAB — VITAMIN B12: Vitamin B-12: 343 pg/mL (ref 232–1245)

## 2019-07-04 LAB — T4, FREE: Free T4: 1.01 ng/dL (ref 0.82–1.77)

## 2019-07-04 NOTE — Progress Notes (Signed)
Dear Carmen Buffy, NP,   Thank you for referring Carmen Wall to our clinic. The following note includes my evaluation and treatment recommendations.  Chief Complaint:   OBESITY Carmen Wall (MR# [id number]) is a 29 y.o. female who presents for evaluation and treatment of obesity and related comorbidities. Carmen Wall was referred to our clinic by Carmen Buffy, NP. Current BMI is Body mass index is 45.52 kg/m.Marland Kitchen Carmen Wall has been struggling with her weight for many years and has been unsuccessful in either losing weight, maintaining weight loss, or reaching her healthy weight goal.  Carmen Wall is currently in the action stage of change and ready to dedicate time achieving and maintaining a healthier weight. Carmen Wall is interested in becoming our patient and working on intensive lifestyle modifications including (but not limited to) diet and exercise for weight loss.  Carmen Wall's habits were reviewed today and are as follows: Her family eats meals together, she thinks her family will eat healthier with her, her desired weight loss is 102 lbs, she has been heavy most of her life, she started gaining weight in her 69's (when married), her heaviest weight ever was 298 pounds, she has significant food cravings issues, she snacks sometimes in the evenings, she skips meals frequently, she sometimes makes poor food choices, she frequently eats larger portions than normal, she has binge eating behaviors and she struggles with emotional eating.  Carmen Wall is lactose sensitive. She skips breakfast, secondary to time. Breakfast consists of eggs (2), toast (2 slices) with water or unsweetened black tea, if she is hungry for breakfast. Lunch consists of peanut butter and jelly or chicken fingers (6) and one bowl of macaroni and cheese (2 cups) and she feels full. Dinner consists of tacos or 8 ounces of [employer] prime rib with salad and rice or fries (eats all of it).    Depression Screen Carmen Wall's Food and Mood (modified PHQ-9) score was strongly positive.  Depression screen PHQ 2/9 07/03/2019  Decreased Interest 2  Down, Depressed, Hopeless 3  PHQ - 2 Score 5  Altered sleeping 2  Tired, decreased energy 2  Change in appetite 3  Feeling bad or failure about yourself  2  Trouble concentrating 1  Moving slowly or fidgety/restless 0  Suicidal thoughts 0  PHQ-9 Score 15  Difficult doing work/chores Extremely dIfficult   Subjective:   Other fatigue (new) Carmen Wall admits to daytime somnolence and admits to waking up still tired. Patent has a history of symptoms of daytime fatigue, morning fatigue and Epworth sleepiness scale. Carmen Wall generally gets 5 to 7 hours of sleep per night, and states that she has generally restless sleep. Snoring is present. Apneic episodes are present. Epworth Sleepiness Score is 15. EKG ordered today shows normal sinus rhythm at 61 BPM.  SOB (shortness of breath) on exertion (new) Carmen Wall notes increasing shortness of breath with exercising and seems to be worsening over time with weight gain. She notes getting out of breath sooner with activity than she used to. This has not gotten worse recently. Carmen Wall denies shortness of breath at rest or orthopnea.  Migraine without aura and without status migrainosus, not intractable Carmen Wall is on Topiramate twice daily. Her migraines are better controlled. She has a tension headache feeling now.  Other hyperlipidemia  Carmen Wall has hyperlipidemia and she has LDL of 106 and HDL of 36. She is not on statin. Carmen Wall is attempting to improve her cholesterol levels with intensive lifestyle modification including a low saturated fat diet, exercise  and weight loss. She denies any chest pain, claudication or myalgias.  Lab Results  Component Value Date   ALT 14 07/03/2019   AST 15 07/03/2019   ALKPHOS 64 07/03/2019   BILITOT 0.5 07/03/2019   Lab Results  Component  Value Date   CHOL 227 (H) 07/03/2019   HDL 45 07/03/2019   LDLCALC 158 (H) 07/03/2019   TRIG 133 07/03/2019   CHOLHDL 4 08/24/2018   Depression with anxiety Carmen Wall shows no sign of suicidal or homicidal ideations. Her symptoms are worse at the time of her menses. She is barely using Buspar anymore.  At risk for heart disease Carmen Wall is at a higher than average risk for cardiovascular disease due to obesity and hyperlipidemia. Reviewed: no chest pain on exertion, no dyspnea on exertion, and no swelling of ankles.  Assessment/Plan:   Other fatigue (new) Carmen Wall does feel that her weight is causing her energy to be lower than it should be. Fatigue may be related to obesity, depression or many other causes. Labs and EKG will be ordered, and in the meanwhile, Carmen Wall will focus on self care including making healthy food choices, increasing physical activity and focusing on stress reduction.  SOB (shortness of breath) on exertion (new) Carmen Wall does feel that she gets out of breath more easily that she used to when she exercises. Carmen Wall's shortness of breath appears to be obesity related and exercise induced. She has agreed to work on weight loss and gradually increase exercise to treat her exercise induced shortness of breath. Labs and indirect calorimetry will be ordered today. Will continue to monitor closely.  Migraine without aura and without status migrainosus, not intractable Carmen Wall will follow up with Dr. Frances Furbish at [facility].  Other hyperlipidemia Cardiovascular risk and specific lipid/LDL goals reviewed.  We discussed several lifestyle modifications today and Carmen Wall will begin to work on diet, exercise and weight loss efforts. We will check fasting lipid panel today. Orders and follow up as documented in patient record.   Counseling Intensive lifestyle modifications are the first line treatment for this issue. . Dietary changes: Increase  soluble fiber. Decrease simple carbohydrates. . Exercise changes: Moderate to vigorous-intensity aerobic activity 150 minutes per week if tolerated. . Lipid-lowering medications: see documented in medical record.  Depression with anxiety Carmen Wall will follow up with Alysia Penna, NP.    At risk for heart disease Carmen Wall was given approximately 15 minutes of coronary artery disease prevention counseling today. She is 29 y.o. female and has risk factors for heart disease including obesity and hyperlipidemia. We discussed intensive lifestyle modifications today with an emphasis on specific weight loss instructions and strategies.   Repetitive spaced learning was employed today to elicit superior memory formation and behavioral change.  Class 3 severe obesity with serious comorbidity and body mass index (BMI) of 45.0 to 49.9 in adult, unspecified obesity type (HCC) Carmen Wall is currently in the action stage of change and her goal is to continue with weight loss efforts. I recommend Carmen Wall begin the structured treatment plan as follows:  She has agreed to the Category 4 Plan.  Exercise goals: No exercise has been prescribed at this time.   Behavioral modification strategies: increasing lean protein intake, increasing vegetables, meal planning and cooking strategies, keeping healthy foods in the home and planning for success.  She was informed of the importance of frequent follow-up visits to maximize her success with intensive lifestyle modifications for her multiple health conditions. She was informed we would discuss her lab  results at her next visit unless there is a critical issue that needs to be addressed sooner. Carmen Wall agreed to keep her next visit at the agreed upon time to discuss these results.  Objective:   Blood pressure 123/74, pulse 63, temperature 98 F (36.7 C), temperature source Oral, height 5\' 6"  (1.676 m), weight 282 lb (127.9 kg), last menstrual period  06/24/2019, SpO2 98 %. Body mass index is 45.52 kg/m.  EKG: Normal sinus rhythm, rate 61 BPM.  Indirect Calorimeter completed today shows a VO2 of 323 and a REE of 2254.  Her calculated basal metabolic rate is 06/26/2019 thus her basal metabolic rate is better than expected.  General: Cooperative, alert, well developed, in no acute distress. HEENT: Conjunctivae and lids unremarkable. Cardiovascular: Regular rhythm.  Lungs: Normal work of breathing. Neurologic: No focal deficits.   Lab Results  Component Value Date   CREATININE 0.79 07/03/2019   BUN 14 07/03/2019   NA 140 07/03/2019   K 4.3 07/03/2019   CL 103 07/03/2019   CO2 23 07/03/2019   Lab Results  Component Value Date   ALT 14 07/03/2019   AST 15 07/03/2019   ALKPHOS 64 07/03/2019   BILITOT 0.5 07/03/2019   Lab Results  Component Value Date   HGBA1C 4.9 07/03/2019   HGBA1C 4.9 09/05/2017   Lab Results  Component Value Date   INSULIN WILL FOLLOW 07/03/2019   Lab Results  Component Value Date   TSH 0.655 07/03/2019   Lab Results  Component Value Date   CHOL 227 (H) 07/03/2019   HDL 45 07/03/2019   LDLCALC 158 (H) 07/03/2019   TRIG 133 07/03/2019   CHOLHDL 4 08/24/2018   Lab Results  Component Value Date   WBC 5.0 07/03/2019   HGB 14.4 07/03/2019   HCT 41.4 07/03/2019   MCV 92 07/03/2019   PLT 189 07/03/2019   No results found for: IRON, TIBC, FERRITIN   Attestation Statements:   This is the patient's first visit at [facility]. The patient's NEW PATIENT PACKET was reviewed at length. Included in the packet: current and past health history, medications, allergies, ROS, gynecologic history (women only), surgical history, family history, social history, weight history, weight loss surgery history (for those that have had weight loss surgery), nutritional evaluation, mood and food questionnaire, PHQ9, Epworth questionnaire, sleep habits questionnaire, patient life and health improvement goals  questionnaire. These will all be scanned into the patient's chart under media.   During the visit, I independently reviewed the patient's EKG, bioimpedance scale results, and indirect calorimeter results. I used this information to tailor a meal plan for the patient that will help her to lose weight and will improve her obesity-related conditions going forward. I performed a medically necessary appropriate examination and/or evaluation. I discussed the assessment and treatment plan with the patient. The patient was provided an opportunity to ask questions and all were answered. The patient agreed with the plan and demonstrated an understanding of the instructions. Labs were ordered at this visit and will be reviewed at the next visit unless more critical results need to be addressed immediately. Clinical information was updated and documented in the EMR.   Time spent on visit including pre-visit chart review and post-visit care was 60 minutes.  I, 07/05/2019, am acting as transcriptionist for [place] Crane, MD.  I have reviewed the above documentation for accuracy and completeness, and I agree with the above. Filbert Schilder, MD

## 2019-07-15 ENCOUNTER — Telehealth: Payer: Self-pay | Admitting: Nurse Practitioner

## 2019-07-15 DIAGNOSIS — F331 Major depressive disorder, recurrent, moderate: Secondary | ICD-10-CM

## 2019-07-15 MED ORDER — TRAZODONE HCL 50 MG PO TABS: 50.0000 mg | ORAL_TABLET | Freq: Every day | ORAL | 0 refills | Status: DC

## 2019-07-15 NOTE — Telephone Encounter (Signed)
rx sent. She needs to maintain f/up appt in order to get additional refills

## 2019-07-15 NOTE — Telephone Encounter (Signed)
Patient request refill for Trazodone 50 mg tab take 1 tab by mouth at bedtime.  Last ov was 04/19/2019--suppose to follow up in 3 mo Lat refill 01/07/2019---90 with 1 refill  Please advise ok to fill

## 2019-07-16 ENCOUNTER — Other Ambulatory Visit: Payer: Self-pay | Admitting: *Deleted

## 2019-07-16 NOTE — Telephone Encounter (Signed)
Message left to return call to [facility] at [phone].   Refill request received for Bupropion. Patient needs to advise if she is still taking and schedule aex.

## 2019-07-17 ENCOUNTER — Ambulatory Visit (INDEPENDENT_AMBULATORY_CARE_PROVIDER_SITE_OTHER): Payer: BC Managed Care – PPO | Admitting: Family Medicine

## 2019-07-17 ENCOUNTER — Other Ambulatory Visit: Payer: Self-pay

## 2019-07-17 VITALS — BP 117/76 | HR 76 | Temp 98.2°F | Ht 66.0 in | Wt 280.0 lb

## 2019-07-17 DIAGNOSIS — Z6841 Body Mass Index (BMI) 40.0 and over, adult: Secondary | ICD-10-CM

## 2019-07-17 DIAGNOSIS — E559 Vitamin D deficiency, unspecified: Secondary | ICD-10-CM | POA: Diagnosis not present

## 2019-07-17 DIAGNOSIS — E7849 Other hyperlipidemia: Secondary | ICD-10-CM

## 2019-07-17 DIAGNOSIS — E8881 Metabolic syndrome: Secondary | ICD-10-CM

## 2019-07-17 DIAGNOSIS — Z9189 Other specified personal risk factors, not elsewhere classified: Secondary | ICD-10-CM

## 2019-07-17 MED ORDER — VITAMIN D (ERGOCALCIFEROL) 1.25 MG (50000 UNIT) PO CAPS: 50000.0000 [IU] | ORAL_CAPSULE | ORAL | 0 refills | Status: DC

## 2019-07-17 NOTE — Progress Notes (Signed)
Chief Complaint:   OBESITY Carmen Wall is here to discuss her progress with her obesity treatment plan along with follow-up of her obesity related diagnoses. Carmen Wall is on the Category 4 Plan and states she is following her eating plan approximately 75% of the time. Carmen Wall states she is exercising at the gym 45 minutes 4 times per week.  Today's visit was #: 2 Starting weight: 282 lbs Starting date: 07/03/2019 Today's weight: 280 lbs Today's date: 07/17/2019 Total lbs lost to date: 2 Total lbs lost since last in-office visit: 2  Interim History: Carmen Wall voices that she did have some struggles in terms of estimating how much food she had to buy, in order to continue the plan. She only has occasional hunger. Carmen Wall hasn't tried the greek yogurt options or milk, secondary to not liking yogurt or milk.  Subjective:   Other hyperlipidemia Carmen Wall has worsening hyperlipidemia and she has LDL of 158 (previously 106). She is not on statin. Carmen Wall has been trying to improve her cholesterol levels with intensive lifestyle modification including a low saturated fat diet, exercise and weight loss.   Lab Results  Component Value Date   ALT 14 07/03/2019   AST 15 07/03/2019   ALKPHOS 64 07/03/2019   BILITOT 0.5 07/03/2019   Lab Results  Component Value Date   CHOL 227 (H) 07/03/2019   HDL 45 07/03/2019   LDLCALC 158 (H) 07/03/2019   TRIG 133 07/03/2019   CHOLHDL 4 08/24/2018   Insulin resistance Carmen Wall has a diagnosis of insulin resistance based on her elevated fasting insulin level >5. Her last A1c was 4.9 and last insulin level was 16.9 (07/03/19). Carmen Wall is not on metformin. She continues to work on diet and exercise to decrease her risk of diabetes. Labs were discussed with patient today.  Lab Results  Component Value Date   INSULIN 16.9 07/03/2019   Lab Results  Component Value Date   HGBA1C 4.9 07/03/2019   Vitamin D  deficiency Carmen Wall's Vitamin D level was 15.5 on 07/03/19. She is not on vit D supplement. She denies nausea, vomiting or muscle weakness. Labs were discussed with patient today.  At risk for deficient intake of food The patient is at a higher than average risk of deficient intake of food due to current food recall.  Assessment/Plan:   Other hyperlipidemia Cardiovascular risk and specific lipid/LDL goals reviewed.  We discussed several lifestyle modifications today and Carmen Wall will continue to work on diet, exercise and weight loss efforts. We will follow up in 6 months with fasting lipid panel. Orders and follow up as documented in patient record.   Counseling Intensive lifestyle modifications are the first line treatment for this issue. . Dietary changes: Increase soluble fiber. Decrease simple carbohydrates. . Exercise changes: Moderate to vigorous-intensity aerobic activity 150 minutes per week if tolerated. . Lipid-lowering medications: see documented in medical record.  Insulin resistance Carmen Wall will continue to work on weight loss, exercise, and decreasing simple carbohydrates to help decrease the risk of diabetes. We will follow up with labs in 3 months and if weight loss is not as expected, we will discuss initiation of metformin. Carmen Wall agreed to follow-up with [place] as directed to closely monitor her progress.  Vitamin D deficiency  Low Vitamin D level contributes to fatigue and are associated with obesity, breast, and colon cancer. Carmen Wall agrees to start prescription Vitamin D @50 ,000 IU every week #4 with no refills and she will follow-up for routine testing of Vitamin  D, at least 2-3 times per year to avoid over-replacement.  At risk for deficient intake of food Carmen Wall was given approximately 15 minutes of deficit intake of food prevention counseling today. Carmen Wall is at risk for eating too few calories based on current food recall. She was encouraged to  focus on meeting caloric and protein goals according to her recommended meal plan.   Class 3 severe obesity with serious comorbidity and body mass index (BMI) of 45.0 to 49.9 in adult, unspecified obesity type (HCC) Carmen Wall is currently in the action stage of change. As such, her goal is to continue with weight loss efforts. She has agreed to the Category 4 Plan and keeping a food journal and adhering to recommended goals of 350 to 500 calories and 30+ grams of protein at breakfast daily.   Exercise goals: Carmen Wall will continue her current exercise regimen.  Behavioral modification strategies: increasing lean protein intake, increasing vegetables, meal planning and cooking strategies, keeping healthy foods in the home and planning for success.  Carmen Wall has agreed to follow-up with our clinic in 2 weeks. She was informed of the importance of frequent follow-up visits to maximize her success with intensive lifestyle modifications for her multiple health conditions.   Objective:   Blood pressure 117/76, pulse 76, temperature 98.2 F (36.8 C), temperature source Oral, height 5\' 6"  (1.676 m), weight 280 lb (127 kg), last menstrual period 07/14/2019, SpO2 96 %. Body mass index is 45.19 kg/m.  General: Cooperative, alert, well developed, in no acute distress. HEENT: Conjunctivae and lids unremarkable. Cardiovascular: Regular rhythm.  Lungs: Normal work of breathing. Neurologic: No focal deficits.   Lab Results  Component Value Date   CREATININE 0.79 07/03/2019   BUN 14 07/03/2019   NA 140 07/03/2019   K 4.3 07/03/2019   CL 103 07/03/2019   CO2 23 07/03/2019   Lab Results  Component Value Date   ALT 14 07/03/2019   AST 15 07/03/2019   ALKPHOS 64 07/03/2019   BILITOT 0.5 07/03/2019   Lab Results  Component Value Date   HGBA1C 4.9 07/03/2019   HGBA1C 4.9 09/05/2017   Lab Results  Component Value Date   INSULIN 16.9 07/03/2019   Lab Results  Component Value Date   TSH  0.655 07/03/2019   Lab Results  Component Value Date   CHOL 227 (H) 07/03/2019   HDL 45 07/03/2019   LDLCALC 158 (H) 07/03/2019   TRIG 133 07/03/2019   CHOLHDL 4 08/24/2018   Lab Results  Component Value Date   WBC 5.0 07/03/2019   HGB 14.4 07/03/2019   HCT 41.4 07/03/2019   MCV 92 07/03/2019   PLT 189 07/03/2019   No results found for: IRON, TIBC, FERRITIN   Ref. Range 07/03/2019 12:28  Vitamin D, 25-Hydroxy Latest Ref Range: 30.0 - 100.0 ng/mL 15.5 (L)    Attestation Statements:   Reviewed by clinician on day of visit: allergies, medications, problem list, medical history, surgical history, family history, social history, and previous encounter notes.  I, 07/05/2019, am acting as transcriptionist for [place] Crane, MD.  I have reviewed the above documentation for accuracy and completeness, and I agree with the above. - Filbert Schilder, MD

## 2019-07-19 ENCOUNTER — Other Ambulatory Visit: Payer: Self-pay

## 2019-07-19 MED ORDER — BUPROPION HCL ER (XL) 150 MG PO TB24: 150.0000 mg | ORAL_TABLET | Freq: Every day | ORAL | 0 refills | Status: DC

## 2019-07-19 NOTE — Telephone Encounter (Signed)
Medication refill request: bupropion xl 150mg  Last OV:  04-16-2019 Next AEX: unable to tell Last MMG (if hormonal medication request): n/a Refill authorized: a couple of telephone calls have been placed to patient to get her to schedule yearly exam. No callback or appt scheduled. It also shows where you refilled medication 06/2019. It shows where someone else discontinued it. Please approve if appropriate. Pharmacy note states she needs to schedule yearly exam before further refills.

## 2019-07-24 NOTE — Telephone Encounter (Signed)
Message left to return call to [facility] at [phone].   Prescription request denied with message sent to pharmacy stating patient should contact office first.

## 2019-08-01 ENCOUNTER — Other Ambulatory Visit: Payer: Self-pay

## 2019-08-01 ENCOUNTER — Ambulatory Visit (INDEPENDENT_AMBULATORY_CARE_PROVIDER_SITE_OTHER): Payer: BC Managed Care – PPO | Admitting: Family Medicine

## 2019-08-01 ENCOUNTER — Encounter (INDEPENDENT_AMBULATORY_CARE_PROVIDER_SITE_OTHER): Payer: Self-pay | Admitting: Family Medicine

## 2019-08-01 VITALS — BP 111/72 | HR 78 | Temp 98.2°F | Ht 66.0 in | Wt 281.0 lb

## 2019-08-01 DIAGNOSIS — E8881 Metabolic syndrome: Secondary | ICD-10-CM

## 2019-08-01 DIAGNOSIS — E7849 Other hyperlipidemia: Secondary | ICD-10-CM | POA: Diagnosis not present

## 2019-08-01 DIAGNOSIS — Z6841 Body Mass Index (BMI) 40.0 and over, adult: Secondary | ICD-10-CM | POA: Diagnosis not present

## 2019-08-01 NOTE — Progress Notes (Signed)
Chief Complaint:   OBESITY [place] is here to discuss her progress with her obesity treatment plan along with follow-up of her obesity related diagnoses. Nayali is on the Category 4 Plan and states she is following her eating plan approximately 50% of the time. Mahreen states she is walking at work 12 miles, 4 times per week.  Today's visit was #: 3 Starting weight: 282 lbs Starting date: 07/03/2019 Today's weight: 281 lbs Today's date: 08/01/2019 Total lbs lost to date: 1 Total lbs lost since last in-office visit: 0  Interim History: Baylea voices that she had a significant amount of stressors and activities that take time away from her focus on the meal plan. Aleida has had no exercise in the past two weeks. She is skipping breakfast daily, but she wants to start doing [place] oatmeal. Rilee has occasionally been on the plan at lunch. She has been drinking quite a few sodas over the last few weeks.  Subjective:   Other hyperlipidemia Heily has hyperlipidemia and her last LDL was 158 and HDL was 45 (07/03/19). She is not on medications. Rutha has been trying to improve her cholesterol levels with intensive lifestyle modification including a low saturated fat diet, exercise and weight loss.   Lab Results  Component Value Date   ALT 14 07/03/2019   AST 15 07/03/2019   ALKPHOS 64 07/03/2019   BILITOT 0.5 07/03/2019   Lab Results  Component Value Date   CHOL 227 (H) 07/03/2019   HDL 45 07/03/2019   LDLCALC 158 (H) 07/03/2019   TRIG 133 07/03/2019   CHOLHDL 4 08/24/2018   Insulin resistance Claramae has a diagnosis of insulin resistance and her last insulin level was 16.9 and last Hgb A1c was 4.9 (07/03/19). She continues to work on diet and exercise to decrease her risk of diabetes.  Lab Results  Component Value Date   INSULIN 16.9 07/03/2019   Lab Results  Component Value Date   HGBA1C 4.9 07/03/2019   Assessment/Plan:    Other hyperlipidemia Cardiovascular risk and specific lipid/LDL goals reviewed.  We discussed several lifestyle modifications today and Vandana will continue to work on diet, exercise and weight loss efforts. We will repeat fasting lipid panel in 2 months. Orders and follow up as documented in patient record.   Insulin resistance Jhanae will continue to work on weight loss, exercise, and decreasing simple carbohydrates to help decrease the risk of diabetes. We will repeat labs in 2 months. Reka agreed to follow-up with [place] as directed to closely monitor her progress.  Class 3 severe obesity with serious comorbidity and body mass index (BMI) of 45.0 to 49.9 in adult, unspecified obesity type (HCC) Shaka is currently in the action stage of change. As such, her goal is to continue with weight loss efforts. She has agreed to keeping a food journal and adhering to recommended goals of 1650 to 1800 calories and 115+ grams of protein daily.   Behavioral modification strategies: increasing lean protein intake, increasing vegetables and meal planning and cooking strategies.  Ranetta has agreed to follow-up with our clinic in 2 weeks. She was informed of the importance of frequent follow-up visits to maximize her success with intensive lifestyle modifications for her multiple health conditions.   Objective:   Blood pressure 111/72, pulse 78, temperature 98.2 F (36.8 C), temperature source Oral, height 5\' 6"  (1.676 m), weight 281 lb (127.5 kg), last menstrual period 07/14/2019, SpO2 97 %. Body mass index is 45.35 kg/m.  General: Cooperative, alert, well developed, in no acute distress. HEENT: Conjunctivae and lids unremarkable. Cardiovascular: Regular rhythm.  Lungs: Normal work of breathing. Neurologic: No focal deficits.   Lab Results  Component Value Date   CREATININE 0.79 07/03/2019   BUN 14 07/03/2019   NA 140 07/03/2019   K 4.3 07/03/2019   CL 103 07/03/2019   CO2  23 07/03/2019   Lab Results  Component Value Date   ALT 14 07/03/2019   AST 15 07/03/2019   ALKPHOS 64 07/03/2019   BILITOT 0.5 07/03/2019   Lab Results  Component Value Date   HGBA1C 4.9 07/03/2019   HGBA1C 4.9 09/05/2017   Lab Results  Component Value Date   INSULIN 16.9 07/03/2019   Lab Results  Component Value Date   TSH 0.655 07/03/2019   Lab Results  Component Value Date   CHOL 227 (H) 07/03/2019   HDL 45 07/03/2019   LDLCALC 158 (H) 07/03/2019   TRIG 133 07/03/2019   CHOLHDL 4 08/24/2018   Lab Results  Component Value Date   WBC 5.0 07/03/2019   HGB 14.4 07/03/2019   HCT 41.4 07/03/2019   MCV 92 07/03/2019   PLT 189 07/03/2019   No results found for: IRON, TIBC, FERRITIN   Ref. Range 07/03/2019 12:28  Vitamin D, 25-Hydroxy Latest Ref Range: 30.0 - 100.0 ng/mL 15.5 (L)    Attestation Statements:   Reviewed by clinician on day of visit: allergies, medications, problem list, medical history, surgical history, family history, social history, and previous encounter notes.  Time spent on visit including pre-visit chart review and post-visit charting and care was 17 minutes.   I, Doreene Nest, am acting as transcriptionist for Coralie Common, MD.  I have reviewed the above documentation for accuracy and completeness, and I agree with the above. - Ilene Qua, MD

## 2019-08-15 ENCOUNTER — Ambulatory Visit (INDEPENDENT_AMBULATORY_CARE_PROVIDER_SITE_OTHER): Payer: BC Managed Care – PPO | Admitting: Family Medicine

## 2019-08-15 ENCOUNTER — Other Ambulatory Visit (INDEPENDENT_AMBULATORY_CARE_PROVIDER_SITE_OTHER): Payer: Self-pay | Admitting: Family Medicine

## 2019-08-15 DIAGNOSIS — E559 Vitamin D deficiency, unspecified: Secondary | ICD-10-CM

## 2019-08-26 ENCOUNTER — Other Ambulatory Visit: Payer: Self-pay | Admitting: *Deleted

## 2019-08-26 NOTE — Telephone Encounter (Signed)
Medication refill request: Bupropion  Last AEX:  08-31-16 JJ  Next AEX: 08-24-19 Last MMG (if hormonal medication request): n/a Refill authorized: Today, please advise.   Medication pended for #30, 0RF. Please refill if appropriate.

## 2019-08-27 MED ORDER — BUPROPION HCL ER (XL) 150 MG PO TB24: 150.0000 mg | ORAL_TABLET | Freq: Every day | ORAL | 0 refills | Status: DC

## 2019-08-29 ENCOUNTER — Ambulatory Visit (INDEPENDENT_AMBULATORY_CARE_PROVIDER_SITE_OTHER): Payer: BC Managed Care – PPO | Admitting: Family Medicine

## 2019-08-29 ENCOUNTER — Other Ambulatory Visit: Payer: Self-pay

## 2019-08-29 ENCOUNTER — Encounter (INDEPENDENT_AMBULATORY_CARE_PROVIDER_SITE_OTHER): Payer: Self-pay | Admitting: Family Medicine

## 2019-08-29 VITALS — BP 112/56 | HR 78 | Temp 98.0°F | Ht 66.0 in | Wt 279.0 lb

## 2019-08-29 DIAGNOSIS — Z6841 Body Mass Index (BMI) 40.0 and over, adult: Secondary | ICD-10-CM

## 2019-08-29 DIAGNOSIS — Z9189 Other specified personal risk factors, not elsewhere classified: Secondary | ICD-10-CM | POA: Diagnosis not present

## 2019-08-29 DIAGNOSIS — E559 Vitamin D deficiency, unspecified: Secondary | ICD-10-CM

## 2019-08-29 DIAGNOSIS — E8881 Metabolic syndrome: Secondary | ICD-10-CM

## 2019-08-29 MED ORDER — VITAMIN D (ERGOCALCIFEROL) 1.25 MG (50000 UNIT) PO CAPS: 50000.0000 [IU] | ORAL_CAPSULE | ORAL | 0 refills | Status: DC
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
## 2019-09-02 NOTE — Progress Notes (Signed)
29 y.o. G27P1001 Legally Separated White or Caucasian Not Hispanic or Latino female here for annual exam.  She has a mirena IUD, placed in 10/18.  She was seen in 11/20 with worsening mood changes, Wellbutrin was added to her Lexapro. She is doing much better, depression and anxiety are well controlled. Still more emotional prior to her cycle, but tolerable. She is sexually active, new partner in the last 2 months. Not using condoms.  Period Duration (Days): 2-4 Period Pattern: (!) Irregular Menstrual Flow: Light Menstrual Control: Thin pad Menstrual Control Change Freq (Hours): 6 Dysmenorrhea: (!) Moderate Dysmenorrhea Symptoms: Headache, Cramping   She c/o straining with BM and intermittent bleeding with BM for the last 2 years. She is having a BM every day, but she strains frequently. Thinks she is more constipated prior to her cycle. Occasional pain with BM.   Patient's last menstrual period was 09/02/2019.          Sexually active: Yes.    The current method of family planning is IUD.    Exercising: Yes.    weight lifting  Smoker:  no  Health Maintenance: Pap:  08/31/16 Normal  History of abnormal Pap:  no TDaP:  01/2016 Gardasil: Completed   reports that she has never smoked. She has never used smokeless tobacco. She reports that she does not drink alcohol or use drugs. Very rare ETOH. She is a Corporate treasurer. Daughter turns 8 this month.   Past Medical History:  Diagnosis Date  . ADHD   . Allergy   . Anxiety   . Anxiety   . Arthritis   . Back pain   . Bilateral swelling of feet   . Chronic fatigue syndrome   . Constipation   . Depression   . IBS (irritable bowel syndrome)   . Joint pain   . Lactose intolerance   . Migraine without aura     Past Surgical History:  Procedure Laterality Date  . INTRAUTERINE DEVICE (IUD) INSERTION  04/2017   Mirena   . KNEE ARTHROSCOPY Right     Current Outpatient Medications  Medication Sig Dispense Refill  .  Acetaminophen-Caffeine (EXCEDRIN TENSION HEADACHE PO) Take by mouth.    Marland Kitchen albuterol (VENTOLIN HFA) 108 (90 Base) MCG/ACT inhaler Inhale 1 puff into the lungs every 6 (six) hours as needed for wheezing or shortness of breath. Need office visit for additional refills 6.7 g 1  . buPROPion (WELLBUTRIN XL) 150 MG 24 hr tablet Take 1 tablet (150 mg total) by mouth daily. 30 tablet 0  . busPIRone (BUSPAR) 15 MG tablet Take 1 tablet (15 mg total) by mouth 2 (two) times daily. 180 tablet 3  . escitalopram (LEXAPRO) 20 MG tablet Take 1 tablet (20 mg total) by mouth at bedtime. 90 tablet 3  . levonorgestrel (MIRENA) 20 MCG/24HR IUD 1 each by Intrauterine route once.    . SUMAtriptan (IMITREX) 25 MG tablet Take 1 tablet (25 mg total) by mouth every 2 (two) hours as needed for migraine. No more than 100mg  in 24hrs 10 tablet 0  . tiZANidine (ZANAFLEX) 4 MG capsule Take 1 capsule (4 mg total) by mouth 3 (three) times daily as needed for muscle spasms. 14 capsule 0  . topiramate (TOPAMAX) 25 MG capsule Take 1 capsule (25 mg total) by mouth 2 (two) times daily. 180 capsule 1  . traZODone (DESYREL) 50 MG tablet Take 1 tablet (50 mg total) by mouth at bedtime. No additional refills without office visit 90 tablet 0  .  Vitamin D, Ergocalciferol, (DRISDOL) 1.25 MG (50000 UNIT) CAPS capsule Take 1 capsule (50,000 Units total) by mouth every 7 (seven) days. 4 capsule 0   No current facility-administered medications for this visit.    Family History  Problem Relation Age of Onset  . Diabetes Father   . Arthritis Father   . Depression Father   . Mental illness Father        PTSD, anxiety  . Hypertension Father   . Hyperlipidemia Father   . Anxiety disorder Father   . Bipolar disorder Father   . Sleep apnea Father   . Alcoholism Father   . Obesity Father   . Arthritis Mother        osteoartthritis  . Fibroids Mother   . Ovarian cysts Mother   . Obesity Mother   . Kidney disease Maternal Grandmother   .  Arthritis Maternal Grandmother   . Prostate cancer Maternal Grandfather   . Arthritis Maternal Grandfather   . Arthritis Paternal Grandmother   . Diabetes Paternal Grandfather   . Mental illness Paternal Grandfather   . Arthritis Paternal Grandfather     Review of Systems  Constitutional: Positive for fatigue.  HENT: Positive for sinus pressure and sneezing.   Eyes: Negative.   Respiratory: Negative.   Cardiovascular: Negative.   Gastrointestinal: Negative.   Endocrine: Negative.   Genitourinary: Positive for menstrual problem.  Musculoskeletal: Negative.   Skin: Negative.   Allergic/Immunologic: Positive for environmental allergies.  Neurological: Negative.   Hematological: Negative.   Psychiatric/Behavioral: Negative.     Exam:   BP 122/64   Pulse (!) 109   Temp 98.1 F (36.7 C)   Ht 5\' 6"  (1.676 m)   Wt 283 lb (128.4 kg)   LMP 09/02/2019   SpO2 99%   BMI 45.68 kg/m   Weight change: @WEIGHTCHANGE @ Height:   Height: 5\' 6"  (167.6 cm)  Ht Readings from Last 3 Encounters:  09/03/19 5\' 6"  (1.676 m)  08/29/19 5\' 6"  (1.676 m)  08/01/19 5\' 6"  (1.676 m)    General appearance: alert, cooperative and appears stated age Head: Normocephalic, without obvious abnormality, atraumatic Neck: no adenopathy, supple, symmetrical, trachea midline and thyroid normal to inspection and palpation Lungs: clear to auscultation bilaterally Cardiovascular: regular rate and rhythm Breasts: normal appearance, no masses or tenderness Abdomen: soft, non-tender; non distended,  no masses,  no organomegaly Extremities: extremities normal, atraumatic, no cyanosis or edema Skin: Skin color, texture, turgor normal. No rashes or lesions Lymph nodes: Cervical, supraclavicular, and axillary nodes normal. No abnormal inguinal nodes palpated Neurologic: Grossly normal   Pelvic: External genitalia:  no lesions              Urethra:  normal appearing urethra with no masses, tenderness or lesions               Bartholins and Skenes: normal                 Vagina: normal appearing vagina with normal color and discharge, no lesions              Cervix: no lesions and IUD strings 2-3 cm               Bimanual Exam:  Uterus:  normal size, contour, position, consistency, mobility, non-tender and anteverted              Adnexa: no mass, fullness, tenderness  Rectovaginal: Confirms               Anus:  normal sphincter tone, no lesions, anal fissure at 6 o'clock  Carolynn Serve chaperoned for the exam.  A:  Well Woman with normal exam  Mirena IUD (placed 10/18)  Depression and anxiety, on lexapro and wellbutrin  Constipation  Some BRB with BM with straining intermittently over the last 2 years.   BMI 45, being seen at the weight loss clinic  Anal fissure, discussed   P:   Pap with hpv  Labs with primary  Discussed constipation, increase fruit, fiber and fluid.   Can try magnesium or metamucil or miralax, if this doesn't help will refer to GI  Discussed breast self exam  Discussed calcium and vit D intake

## 2019-09-03 ENCOUNTER — Other Ambulatory Visit (HOSPITAL_COMMUNITY)
Admission: RE | Admit: 2019-09-03 | Discharge: 2019-09-03 | Disposition: A | Discharge status: Home / Self Care | Payer: BC Managed Care – PPO | Source: Ambulatory Visit | Attending: Obstetrics and Gynecology | Admitting: Obstetrics and Gynecology

## 2019-09-03 ENCOUNTER — Ambulatory Visit: Payer: BC Managed Care – PPO | Admitting: Obstetrics and Gynecology

## 2019-09-03 ENCOUNTER — Encounter: Payer: Self-pay | Admitting: Obstetrics and Gynecology

## 2019-09-03 ENCOUNTER — Other Ambulatory Visit: Payer: Self-pay

## 2019-09-03 VITALS — BP 122/64 | HR 109 | Temp 98.1°F | Ht 66.0 in | Wt 283.0 lb

## 2019-09-03 DIAGNOSIS — Z113 Encounter for screening for infections with a predominantly sexual mode of transmission: Secondary | ICD-10-CM | POA: Diagnosis not present

## 2019-09-03 DIAGNOSIS — Z124 Encounter for screening for malignant neoplasm of cervix: Secondary | ICD-10-CM | POA: Diagnosis present

## 2019-09-03 DIAGNOSIS — Z30431 Encounter for routine checking of intrauterine contraceptive device: Secondary | ICD-10-CM | POA: Diagnosis not present

## 2019-09-03 DIAGNOSIS — K5900 Constipation, unspecified: Secondary | ICD-10-CM

## 2019-09-03 DIAGNOSIS — F418 Other specified anxiety disorders: Secondary | ICD-10-CM | POA: Diagnosis not present

## 2019-09-03 DIAGNOSIS — K602 Anal fissure, unspecified: Secondary | ICD-10-CM

## 2019-09-03 DIAGNOSIS — Z01419 Encounter for gynecological examination (general) (routine) without abnormal findings: Secondary | ICD-10-CM

## 2019-09-03 DIAGNOSIS — Z6841 Body Mass Index (BMI) 40.0 and over, adult: Secondary | ICD-10-CM

## 2019-09-03 MED ORDER — BUPROPION HCL ER (XL) 150 MG PO TB24: 150.0000 mg | ORAL_TABLET | Freq: Every day | ORAL | 3 refills | Status: DC

## 2019-09-03 MED ORDER — ESCITALOPRAM OXALATE 20 MG PO TABS: 20.0000 mg | ORAL_TABLET | Freq: Every day | ORAL | 3 refills | Status: DC

## 2019-09-03 NOTE — Patient Instructions (Addendum)
EXERCISE AND DIET:  We recommended that you start or continue a regular exercise program for good health. Regular exercise means any activity that makes your heart beat faster and makes you sweat.  We recommend exercising at least 30 minutes per day at least 3 days a week, preferably 4 or 5.  We also recommend a diet low in fat and sugar.  Inactivity, poor dietary choices and obesity can cause diabetes, heart attack, stroke, and kidney damage, among others.    ALCOHOL AND SMOKING:  Women should limit their alcohol intake to no more than 7 drinks/beers/glasses of wine (combined, not each!) per week. Moderation of alcohol intake to this level decreases your risk of breast cancer and liver damage. And of course, no recreational drugs are part of a healthy lifestyle.  And absolutely no smoking or even second hand smoke. Most people know smoking can cause heart and lung diseases, but did you know it also contributes to weakening of your bones? Aging of your skin?  Yellowing of your teeth and nails?  CALCIUM AND VITAMIN D:  Adequate intake of calcium and Vitamin D are recommended.  The recommendations for exact amounts of these supplements seem to change often, but generally speaking 1,000 mg of calcium (between diet and supplement) and 800 units of Vitamin D per day seems prudent. Certain women may benefit from higher intake of Vitamin D.  If you are among these women, your doctor will have told you during your visit.    PAP SMEARS:  Pap smears, to check for cervical cancer or precancers,  have traditionally been done yearly, although recent scientific advances have shown that most women can have pap smears less often.  However, every woman still should have a physical exam from her gynecologist every year. It will include a breast check, inspection of the vulva and vagina to check for abnormal growths or skin changes, a visual exam of the cervix, and then an exam to evaluate the size and shape of the uterus and  ovaries.  And after 31 years of age, a rectal exam is indicated to check for rectal cancers. We will also provide age appropriate advice regarding health maintenance, like when you should have certain vaccines, screening for sexually transmitted diseases, bone density testing, colonoscopy, mammograms, etc.   MAMMOGRAMS:  All women over 40 years old should have a yearly mammogram. Many facilities now offer a "3D" mammogram, which may cost around $50 extra out of pocket. If possible,  we recommend you accept the option to have the 3D mammogram performed.  It both reduces the number of women who will be called back for extra views which then turn out to be normal, and it is better than the routine mammogram at detecting truly abnormal areas.    COLON CANCER SCREENING: Now recommend starting at age 45. At this time colonoscopy is not covered for routine screening until 50. There are take home tests that can be done between 45-49.   COLONOSCOPY:  Colonoscopy to screen for colon cancer is recommended for all women at age 50.  We know, you hate the idea of the prep.  We agree, BUT, having colon cancer and not knowing it is worse!!  Colon cancer so often starts as a polyp that can be seen and removed at colonscopy, which can quite literally save your life!  And if your first colonoscopy is normal and you have no family history of colon cancer, most women don't have to have it again for   10 years.  Once every ten years, you can do something that may end up saving your life, right?  We will be happy to help you get it scheduled when you are ready.  Be sure to check your insurance coverage so you understand how much it will cost.  It may be covered as a preventative service at no cost, but you should check your particular policy.      Breast Self-Awareness Breast self-awareness means being familiar with how your breasts look and feel. It involves checking your breasts regularly and reporting any changes to your  health care provider. Practicing breast self-awareness is important. A change in your breasts can be a sign of a serious medical problem. Being familiar with how your breasts look and feel allows you to find any problems early, when treatment is more likely to be successful. All women should practice breast self-awareness, including women who have had breast implants. How to do a breast self-exam One way to learn what is normal for your breasts and whether your breasts are changing is to do a breast self-exam. To do a breast self-exam: Look for Changes  1. Remove all the clothing above your waist. 2. Stand in front of a mirror in a room with good lighting. 3. Put your hands on your hips. 4. Push your hands firmly downward. 5. Compare your breasts in the mirror. Look for differences between them (asymmetry), such as: ? Differences in shape. ? Differences in size. ? Puckers, dips, and bumps in one breast and not the other. 6. Look at each breast for changes in your skin, such as: ? Redness. ? Scaly areas. 7. Look for changes in your nipples, such as: ? Discharge. ? Bleeding. ? Dimpling. ? Redness. ? A change in position. Feel for Changes Carefully feel your breasts for lumps and changes. It is best to do this while lying on your back on the floor and again while sitting or standing in the shower or tub with soapy water on your skin. Feel each breast in the following way:  Place the arm on the side of the breast you are examining above your head.  Feel your breast with the other hand.  Start in the nipple area and make  inch (2 cm) overlapping circles to feel your breast. Use the pads of your three middle fingers to do this. Apply light pressure, then medium pressure, then firm pressure. The light pressure will allow you to feel the tissue closest to the skin. The medium pressure will allow you to feel the tissue that is a little deeper. The firm pressure will allow you to feel the tissue  close to the ribs.  Continue the overlapping circles, moving downward over the breast until you feel your ribs below your breast.  Move one finger-width toward the center of the body. Continue to use the  inch (2 cm) overlapping circles to feel your breast as you move slowly up toward your collarbone.  Continue the up and down exam using all three pressures until you reach your armpit.  Write Down What You Find  Write down what is normal for each breast and any changes that you find. Keep a written record with breast changes or normal findings for each breast. By writing this information down, you do not need to depend only on memory for size, tenderness, or location. Write down where you are in your menstrual cycle, if you are still menstruating. If you are having trouble noticing differences   in your breasts, do not get discouraged. With time you will become more familiar with the variations in your breasts and more comfortable with the exam. How often should I examine my breasts? Examine your breasts every month. If you are breastfeeding, the best time to examine your breasts is after a feeding or after using a breast pump. If you menstruate, the best time to examine your breasts is 5-7 days after your period is over. During your period, your breasts are lumpier, and it may be more difficult to notice changes. When should I see my health care provider? See your health care provider if you notice:  A change in shape or size of your breasts or nipples.  A change in the skin of your breast or nipples, such as a reddened or scaly area.  Unusual discharge from your nipples.  A lump or thick area that was not there before.  Pain in your breasts.  Anything that concerns you. About Constipation  Constipation Overview Constipation is the most common gastrointestinal complaint -- about 4 million Americans experience constipation and make 2.5 million physician visits a year to get help for the  problem.  Constipation can occur when the colon absorbs too much water, the colon's muscle contraction is slow or sluggish, and/or there is delayed transit time through the colon.  The result is stool that is hard and dry.  Indicators of constipation include straining during bowel movements greater than 25% of the time, having fewer than three bowel movements per week, and/or the feeling of incomplete evacuation.  There are established guidelines ([place] II ) for defining constipation. A person needs to have two or more of the following symptoms for at least 12 weeks (not necessarily consecutive) in the preceding 12 months: . Straining in  greater than 25% of bowel movements . Lumpy or hard stools in greater than 25% of bowel movements . Sensation of incomplete emptying in greater than 25% of bowel movements . Sensation of anorectal obstruction/blockade in greater than 25% of bowel movements . Manual maneuvers to help empty greater than 25% of bowel movements (e.g., digital evacuation, support of the pelvic floor)  . Less than  3 bowel movements/week . Loose stools are not present, and criteria for irritable bowel syndrome are insufficient  Common Causes of Constipation . Lack of fiber in your diet . Lack of physical activity . Medications, including iron and calcium supplements  . Dairy intake . Dehydration . Abuse of laxatives  Travel  Irritable Bowel Syndrome  Pregnancy  Luteal phase of menstruation (after ovulation and before menses)  Colorectal problems  Intestinal Dysfunction  Treating Constipation  There are several ways of treating constipation, including changes to diet and exercise, use of laxatives, adjustments to the pelvic floor, and scheduled toileting.  These treatments include: . increasing fiber and fluids in the diet  . increasing physical activity . learning muscle coordination   learning proper toileting techniques and toileting modifications   designing and  sticking  to a toileting schedule     2007, [employer] Doc.22  You can try Magnesium 500 mg a day, or metamucil, or miralax.    Anal Fissure, Adult  An anal fissure is a small tear or crack in the tissue of the anus. Bleeding from a fissure usually stops on its own within a few minutes. However, bleeding will often occur again with each bowel movement until the fissure heals. What are the causes? This condition is usually caused by passing a  large or hard stool (feces). Other causes include:  Constipation.  Frequent diarrhea.  Inflammatory bowel disease (Crohn's disease or ulcerative colitis).  Childbirth.  Infections.  Anal sex. What are the signs or symptoms? Symptoms of this condition include:  Bleeding from the rectum.  Small amounts of blood seen on your stool, on the toilet paper, or in the toilet after a bowel movement. The blood coats the outside of the stool and is not mixed with the stool.  Painful bowel movements.  Itching or irritation around the anus. How is this diagnosed? A health care provider may diagnose this condition by closely examining the anal area. An anal fissure can usually be seen with careful inspection. In some cases, a rectal exam may be performed, or a short tube (anoscope) may be used to examine the anal canal. How is this treated? Initial treatment for this condition may include:  Taking steps to avoid constipation. This may include making changes to your diet, such as increasing your intake of fiber or fluid.  Taking fiber supplements. These supplements can soften your stool to help make bowel movements easier. Your health care provider may also prescribe a stool softener if your stool is hard.  Taking sitz baths. This may help to heal the tear.  Using medicated creams or ointments. These may be prescribed to lessen discomfort. Treatments that are sometimes used if initial treatments do not work well or if the condition  is more severe may include:  Botulinum injection.  Surgery to repair the fissure. Follow these instructions at home: Eating and drinking   Avoid foods that may cause constipation, such as bananas, milk, and other dairy products.  Eat all fruits, except bananas.  Drink enough fluid to keep your urine pale yellow.  Eat foods that are high in fiber, such as beans, whole grains, and fresh fruits and vegetables. General instructions   Take over-the-counter and prescription medicines only as told by your health care provider.  Use creams or ointments only as told by your health care provider.  Keep the anal area clean and dry.  Take sitz baths as told by your health care provider. Do not use soap in the sitz baths.  Keep all follow-up visits as told by your health care provider. This is important. Contact a health care provider if you have:  More bleeding.  A fever.  Diarrhea that is mixed with blood.  Pain that continues.  Ongoing problems that are getting worse rather than better. Summary  An anal fissure is a small tear or crack in the tissue of the anus. This condition is usually caused by passing a large or hard stool (feces). Other causes include constipation and frequent diarrhea.  Initial treatment for this condition may include taking steps to avoid constipation, such as increasing your intake of fiber or fluid.  Follow instructions for care as told by your health care provider.  Contact your health care provider if you have more bleeding or your problem is getting worse rather than better.  Keep all follow-up visits as told by your health care provider. This is important. This information is not intended to replace advice given to you by your health care provider. Make sure you discuss any questions you have with your health care provider. Document Revised: 11/02/2017 Document Reviewed: 11/02/2017 Elsevier Patient Education  2020 [employer].

## 2019-09-06 LAB — CYTOLOGY - PAP
Chlamydia: NEGATIVE
Comment: NEGATIVE
Comment: NEGATIVE
Comment: NEGATIVE
Comment: NORMAL
Diagnosis: NEGATIVE
High risk HPV: NEGATIVE
Neisseria Gonorrhea: NEGATIVE
Trichomonas: NEGATIVE

## 2019-09-16 ENCOUNTER — Encounter: Payer: Self-pay | Admitting: Nurse Practitioner

## 2019-09-17 ENCOUNTER — Other Ambulatory Visit: Payer: Self-pay

## 2019-09-17 ENCOUNTER — Encounter (INDEPENDENT_AMBULATORY_CARE_PROVIDER_SITE_OTHER): Payer: Self-pay | Admitting: Family Medicine

## 2019-09-17 ENCOUNTER — Ambulatory Visit (INDEPENDENT_AMBULATORY_CARE_PROVIDER_SITE_OTHER): Payer: BC Managed Care – PPO | Admitting: Family Medicine

## 2019-09-17 VITALS — BP 117/79 | HR 68 | Temp 97.8°F | Ht 66.0 in | Wt 277.0 lb

## 2019-09-17 DIAGNOSIS — E559 Vitamin D deficiency, unspecified: Secondary | ICD-10-CM | POA: Diagnosis not present

## 2019-09-17 DIAGNOSIS — E8881 Metabolic syndrome: Secondary | ICD-10-CM

## 2019-09-17 DIAGNOSIS — Z6841 Body Mass Index (BMI) 40.0 and over, adult: Secondary | ICD-10-CM

## 2019-09-17 DIAGNOSIS — Z9189 Other specified personal risk factors, not elsewhere classified: Secondary | ICD-10-CM

## 2019-09-17 MED ORDER — VITAMIN D (ERGOCALCIFEROL) 1.25 MG (50000 UNIT) PO CAPS: 50000.0000 [IU] | ORAL_CAPSULE | ORAL | 0 refills | Status: DC

## 2019-09-18 NOTE — Progress Notes (Signed)
Chief Complaint:   OBESITY Carmen Wall is here to discuss her progress with her obesity treatment plan along with follow-up of her obesity related diagnoses. Carmen Wall is on keeping a food journal and adhering to recommended goals of 1650-1800 calories and 115+ grams of protein and states she is following her eating plan approximately 50% of the time. Carmen Wall states she is active while working.  Today's visit was #: 5 Starting weight: 282 lbs Starting date: 07/03/2019 Today's weight: 277 lbs Today's date: 09/17/2019 Total lbs lost to date: 5 Total lbs lost since last in-office visit: 2  Interim History: Carmen Wall voices the last few weeks were up and down. She voices she has made healthier choices especially controlling portions sizes. Last night she used food she had at home versus ordering out. She hasn't kept a journal secondary to being busy and stressed with work. She is occasionally drinking soda at lunch. She is trying to get breakfast in daily and doing easier grab and go options, like [employer] [place] sausage bowl or oatmeal. She is going away to [place] this week, for her daughter's birthday and then [place] for 2 weeks.  Subjective:   1. Vitamin D deficiency Carmen Wall denies nausea, vomiting, or muscle weakness, but she notes fatigue. She is on prescription Vit D. Last Vit D level was 15.5.  2. Insulin resistance Carmen Wall's last Hgb A1c was 4.9 and insulin 16.9. she is not on metformin.  3. At risk for osteoporosis Carmen Wall is at higher risk of osteopenia and osteoporosis due to Vitamin D deficiency.   Assessment/Plan:   1. Vitamin D deficiency Low Vitamin D level contributes to fatigue and are associated with obesity, breast, and colon cancer. We will refill prescription Vitamin D for 1 month. [name] will follow-up for routine testing of Vitamin D, at least 2-3 times per year to avoid over-replacement.  - Vitamin D, Ergocalciferol, (DRISDOL) 1.25 MG  (50000 UNIT) CAPS capsule; Take 1 capsule (50,000 Units total) by mouth every 7 (seven) days.  Dispense: 4 capsule; Refill: 0  2. Insulin resistance Carmen Wall will continue to work on weight loss, exercise, and decreasing simple carbohydrates to help decrease the risk of diabetes. We will repeat labs in 1 month. Carmen Wall agreed to follow-up with [place] as directed to closely monitor her progress.  3. At risk for osteoporosis Carmen Wall was given approximately 15 minutes of osteoporosis prevention counseling today. Carmen Wall is at risk for osteopenia and osteoporosis due to her Vitamin D deficiency. She was encouraged to take her Vitamin D and follow her higher calcium diet and increase strengthening exercise to help strengthen her bones and decrease her risk of osteopenia and osteoporosis.  Repetitive spaced learning was employed today to elicit superior memory formation and behavioral change.  4. Class 3 severe obesity with serious comorbidity and body mass index (BMI) of 40.0 to 44.9 in adult, unspecified obesity type (HCC) Carmen Wall is currently in the action stage of change. As such, her goal is to continue with weight loss efforts. She has agreed to practicing portion control and making smarter food choices, such as increasing vegetables and decreasing simple carbohydrates.   Exercise goals: All adults should avoid inactivity. Some physical activity is better than none, and adults who participate in any amount of physical activity gain some health benefits.  Behavioral modification strategies: increasing lean protein intake, increasing vegetables, meal planning and cooking strategies, keeping healthy foods in the home and planning for success.  Carmen Wall has agreed to follow-up with our clinic  in 4 weeks. She was informed of the importance of frequent follow-up visits to maximize her success with intensive lifestyle modifications for her multiple health conditions.   Objective:   Blood  pressure 117/79, pulse 68, temperature 97.8 F (36.6 C), temperature source Oral, height 5\' 6"  (1.676 m), weight 277 lb (125.6 kg), last menstrual period 09/02/2019, SpO2 98 %. Body mass index is 44.71 kg/m.  General: Cooperative, alert, well developed, in no acute distress. HEENT: Conjunctivae and lids unremarkable. Cardiovascular: Regular rhythm.  Lungs: Normal work of breathing. Neurologic: No focal deficits.   Lab Results  Component Value Date   CREATININE 0.79 07/03/2019   BUN 14 07/03/2019   NA 140 07/03/2019   K 4.3 07/03/2019   CL 103 07/03/2019   CO2 23 07/03/2019   Lab Results  Component Value Date   ALT 14 07/03/2019   AST 15 07/03/2019   ALKPHOS 64 07/03/2019   BILITOT 0.5 07/03/2019   Lab Results  Component Value Date   HGBA1C 4.9 07/03/2019   HGBA1C 4.9 09/05/2017   Lab Results  Component Value Date   INSULIN 16.9 07/03/2019   Lab Results  Component Value Date   TSH 0.655 07/03/2019   Lab Results  Component Value Date   CHOL 227 (H) 07/03/2019   HDL 45 07/03/2019   LDLCALC 158 (H) 07/03/2019   TRIG 133 07/03/2019   CHOLHDL 4 08/24/2018   Lab Results  Component Value Date   WBC 5.0 07/03/2019   HGB 14.4 07/03/2019   HCT 41.4 07/03/2019   MCV 92 07/03/2019   PLT 189 07/03/2019   No results found for: IRON, TIBC, FERRITIN  Attestation Statements:   Reviewed by clinician on day of visit: allergies, medications, problem list, medical history, surgical history, family history, social history, and previous encounter notes.   I, 07/05/2019, am acting as transcriptionist for Burt Knack, MD. I have reviewed the above documentation for accuracy and completeness, and I agree with the above. - Margarette Asal, MD

## 2019-09-23 ENCOUNTER — Other Ambulatory Visit (INDEPENDENT_AMBULATORY_CARE_PROVIDER_SITE_OTHER): Payer: Self-pay | Admitting: Family Medicine

## 2019-09-23 DIAGNOSIS — E559 Vitamin D deficiency, unspecified: Secondary | ICD-10-CM

## 2019-09-26 ENCOUNTER — Encounter: Payer: Self-pay | Admitting: Nurse Practitioner

## 2019-09-26 ENCOUNTER — Other Ambulatory Visit: Payer: Self-pay

## 2019-09-26 ENCOUNTER — Telehealth: Payer: BC Managed Care – PPO | Admitting: Nurse Practitioner

## 2019-09-26 VITALS — BP 112/84 | HR 71 | Temp 97.5°F | Ht 66.0 in | Wt 285.8 lb

## 2019-09-26 DIAGNOSIS — J45991 Cough variant asthma: Secondary | ICD-10-CM | POA: Insufficient documentation

## 2019-09-26 DIAGNOSIS — J302 Other seasonal allergic rhinitis: Secondary | ICD-10-CM | POA: Diagnosis not present

## 2019-09-26 DIAGNOSIS — M79671 Pain in right foot: Secondary | ICD-10-CM | POA: Diagnosis not present

## 2019-09-26 HISTORY — DX: Cough variant asthma: J45.991

## 2019-09-26 MED ORDER — ALBUTEROL SULFATE HFA 108 (90 BASE) MCG/ACT IN AERS: 1.0000 | INHALATION_SPRAY | Freq: Four times a day (QID) | RESPIRATORY_TRACT | 1 refills | Status: DC | PRN

## 2019-09-26 NOTE — Progress Notes (Signed)
Subjective:  Patient ID: Carmen Wall, female    DOB: 06-19-1990  Age: 29 y.o. MRN: [id number]  CC: Follow-up (albuterol refill---allergy/SOB at times when outside. )  HPI Cough and nasal congestion: Chronic, onset over 70yrs ago, waxing and waning, Triggered by change in weather, increased pollen and tobacco smoke exposure. Symptoms (cough, chest tightness, wheezing, nasal congestion) Unable to tolerate OTC antihistamine (claritin, allegra, zyrtec, and xyzal), causes increased somnolence. Some relief with flonase. Use of albuterol prn with significant relief, last filled 2019.  Right foot pain: Onset 27months ago, worsening, denies any injury, worse with weight bearing and palpation.  Reviewed past Medical, Social and Family history today.  Outpatient Medications Prior to Visit  Medication Sig Dispense Refill  . Acetaminophen-Caffeine (EXCEDRIN TENSION HEADACHE PO) Take by mouth.    Marland Kitchen buPROPion (WELLBUTRIN XL) 150 MG 24 hr tablet Take 1 tablet (150 mg total) by mouth daily. 90 tablet 3  . busPIRone (BUSPAR) 15 MG tablet Take 1 tablet (15 mg total) by mouth 2 (two) times daily. 180 tablet 3  . escitalopram (LEXAPRO) 20 MG tablet Take 1 tablet (20 mg total) by mouth at bedtime. 90 tablet 3  . levonorgestrel (MIRENA) 20 MCG/24HR IUD 1 each by Intrauterine route once.    . SUMAtriptan (IMITREX) 25 MG tablet Take 1 tablet (25 mg total) by mouth every 2 (two) hours as needed for migraine. No more than 100mg  in 24hrs 10 tablet 0  . tiZANidine (ZANAFLEX) 4 MG capsule Take 1 capsule (4 mg total) by mouth 3 (three) times daily as needed for muscle spasms. 14 capsule 0  . topiramate (TOPAMAX) 25 MG capsule Take 1 capsule (25 mg total) by mouth 2 (two) times daily. 180 capsule 1  . traZODone (DESYREL) 50 MG tablet Take 1 tablet (50 mg total) by mouth at bedtime. No additional refills without office visit 90 tablet 0  . Vitamin D, Ergocalciferol, (DRISDOL) 1.25 MG (50000 UNIT) CAPS capsule  Take 1 capsule (50,000 Units total) by mouth every 7 (seven) days. 4 capsule 0  . albuterol (VENTOLIN HFA) 108 (90 Base) MCG/ACT inhaler Inhale 1 puff into the lungs every 6 (six) hours as needed for wheezing or shortness of breath. Need office visit for additional refills 6.7 g 1   No facility-administered medications prior to visit.    ROS See HPI  Objective:  BP 112/84   Pulse 71   Temp (!) 97.5 F (36.4 C) (Tympanic)   Ht 5\' 6"  (1.676 m)   Wt 285 lb 12.8 oz (129.6 kg)   LMP 09/02/2019   SpO2 99%   BMI 46.13 kg/m   BP Readings from Last 3 Encounters:  09/26/19 112/84  09/17/19 117/79  09/03/19 122/64    Wt Readings from Last 3 Encounters:  09/26/19 285 lb 12.8 oz (129.6 kg)  09/17/19 277 lb (125.6 kg)  09/03/19 283 lb (128.4 kg)    Physical Exam Vitals reviewed.  Constitutional:      Appearance: She is obese.  Cardiovascular:     Rate and Rhythm: Normal rate and regular rhythm.     Pulses: Normal pulses.     Heart sounds: Normal heart sounds.  Pulmonary:     Effort: Pulmonary effort is normal.     Breath sounds: Normal breath sounds.  Musculoskeletal:        General: Tenderness present. No swelling or deformity.     Right lower leg: No edema.     Left lower leg: No edema.  Feet:  Right foot:     Skin integrity: Skin integrity normal.     Toenail Condition: Right toenails are normal.     Left foot:     Skin integrity: Skin integrity normal.     Toenail Condition: Left toenails are normal.     Comments: Tenderness along right lateral foot, skin intact, no erythema, no palpable mass. Neurological:     Mental Status: She is alert and oriented to person, place, and time.    Lab Results  Component Value Date   WBC 5.0 07/03/2019   HGB 14.4 07/03/2019   HCT 41.4 07/03/2019   PLT 189 07/03/2019   GLUCOSE 84 07/03/2019   CHOL 227 (H) 07/03/2019   TRIG 133 07/03/2019   HDL 45 07/03/2019   LDLCALC 158 (H) 07/03/2019   ALT 14 07/03/2019   AST 15  07/03/2019   NA 140 07/03/2019   K 4.3 07/03/2019   CL 103 07/03/2019   CREATININE 0.79 07/03/2019   BUN 14 07/03/2019   CO2 23 07/03/2019   TSH 0.655 07/03/2019   HGBA1C 4.9 07/03/2019    No results found.  Assessment & Plan:  This visit occurred during the SARS-CoV-2 public health emergency.  Safety protocols were in place, including screening questions prior to the visit, additional usage of staff PPE, and extensive cleaning of exam room while observing appropriate contact time as indicated for disinfecting solutions.   Carmen Wall was seen today for follow-up.  Diagnoses and all orders for this visit:  Right foot pain -     Ambulatory referral to Podiatry  Cough variant asthma -     albuterol (VENTOLIN HFA) 108 (90 Base) MCG/ACT inhaler; Inhale 1 puff into the lungs every 6 (six) hours as needed for wheezing or shortness of breath.  Seasonal allergic rhinitis, unspecified trigger -     albuterol (VENTOLIN HFA) 108 (90 Base) MCG/ACT inhaler; Inhale 1 puff into the lungs every 6 (six) hours as needed for wheezing or shortness of breath.   I have changed Carmen Wall's albuterol. I am also having her maintain her busPIRone, tiZANidine, SUMAtriptan, topiramate, levonorgestrel, Acetaminophen-Caffeine (EXCEDRIN TENSION HEADACHE PO), traZODone, escitalopram, buPROPion, and Vitamin D (Ergocalciferol).  Meds ordered this encounter  Medications  . albuterol (VENTOLIN HFA) 108 (90 Base) MCG/ACT inhaler    Sig: Inhale 1 puff into the lungs every 6 (six) hours as needed for wheezing or shortness of breath.    Dispense:  6.7 g    Refill:  1    Order Specific Question:   Supervising Provider    Answer:   Ronnald Nian [[id number]]    Problem List Items Addressed This Visit      Respiratory   Cough variant asthma    Chronic, waxing and waning, Triggered by change in weather, increased pollen and tobacco smoke exposure. Symptoms (cough, chest tightness, wheezing, nasal  congestion) Unable to tolerate OTC antihistamine (claritin, allegra, zyrtec, and xyzal), causes increased somnolence. Some relief with flonase. Use of albuterol prn with significant relief, last filled 2019.  Albuterol refilled today Continue flonase Recommended use of montelukast during the spring season, but she declined.      Relevant Medications   albuterol (VENTOLIN HFA) 108 (90 Base) MCG/ACT inhaler   Rhinitis, allergic   Relevant Medications   albuterol (VENTOLIN HFA) 108 (90 Base) MCG/ACT inhaler    Other Visit Diagnoses    Right foot pain    -  Primary   Relevant Orders   Ambulatory referral to Podiatry  Follow-up: No follow-ups on file.  Wilfred Lacy, NP

## 2019-09-26 NOTE — Patient Instructions (Signed)
Continue flonase daily Consider use of montelukast during spring season  You will be contacted to schedule appt with podiatry

## 2019-09-26 NOTE — Assessment & Plan Note (Addendum)
Chronic, waxing and waning, Triggered by change in weather, increased pollen and tobacco smoke exposure. Symptoms (cough, chest tightness, wheezing, nasal congestion) Unable to tolerate OTC antihistamine (claritin, allegra, zyrtec, and xyzal), causes increased somnolence. Some relief with flonase. Use of albuterol prn with significant relief, last filled 2019.  Albuterol refilled today Continue flonase Recommended use of montelukast during the spring season, but she declined.

## 2019-10-09 ENCOUNTER — Telehealth: Payer: Self-pay

## 2019-10-09 DIAGNOSIS — G43009 Migraine without aura, not intractable, without status migrainosus: Secondary | ICD-10-CM

## 2019-10-09 NOTE — Telephone Encounter (Signed)
Received prescription request for:  Topiramate  180 capsules    1 Refill  Last filled-03/13/19  Last office visit- 03/13/19  [facility] please advise.

## 2019-10-10 MED ORDER — TOPIRAMATE 25 MG PO CPSP: 25.0000 mg | ORAL_CAPSULE | Freq: Two times a day (BID) | ORAL | 1 refills | Status: DC

## 2019-10-10 NOTE — Telephone Encounter (Signed)
Ok to refill 

## 2019-10-10 NOTE — Addendum Note (Signed)
Addended by: Rene Paci on: 10/10/2019 09:55 AM   Modules accepted: Orders

## 2019-10-10 NOTE — Telephone Encounter (Signed)
Rx sent 

## 2019-10-16 ENCOUNTER — Other Ambulatory Visit: Payer: Self-pay | Admitting: Nurse Practitioner

## 2019-10-16 DIAGNOSIS — F331 Major depressive disorder, recurrent, moderate: Secondary | ICD-10-CM

## 2019-10-17 ENCOUNTER — Other Ambulatory Visit: Payer: Self-pay | Admitting: Nurse Practitioner

## 2019-10-17 DIAGNOSIS — F418 Other specified anxiety disorders: Secondary | ICD-10-CM

## 2019-10-23 ENCOUNTER — Other Ambulatory Visit (INDEPENDENT_AMBULATORY_CARE_PROVIDER_SITE_OTHER): Payer: Self-pay | Admitting: Family Medicine

## 2019-10-23 DIAGNOSIS — E559 Vitamin D deficiency, unspecified: Secondary | ICD-10-CM

## 2019-10-24 ENCOUNTER — Ambulatory Visit (INDEPENDENT_AMBULATORY_CARE_PROVIDER_SITE_OTHER): Payer: BC Managed Care – PPO | Admitting: Family Medicine

## 2019-10-24 ENCOUNTER — Encounter (INDEPENDENT_AMBULATORY_CARE_PROVIDER_SITE_OTHER): Payer: Self-pay | Admitting: Family Medicine

## 2019-10-24 ENCOUNTER — Other Ambulatory Visit: Payer: Self-pay

## 2019-10-24 VITALS — HR 80 | Temp 97.9°F | Ht 66.0 in | Wt 282.0 lb

## 2019-10-24 DIAGNOSIS — E559 Vitamin D deficiency, unspecified: Secondary | ICD-10-CM | POA: Diagnosis not present

## 2019-10-24 DIAGNOSIS — E8881 Metabolic syndrome: Secondary | ICD-10-CM | POA: Diagnosis not present

## 2019-10-24 DIAGNOSIS — Z9189 Other specified personal risk factors, not elsewhere classified: Secondary | ICD-10-CM | POA: Diagnosis not present

## 2019-10-24 DIAGNOSIS — Z6841 Body Mass Index (BMI) 40.0 and over, adult: Secondary | ICD-10-CM

## 2019-10-24 MED ORDER — VITAMIN D (ERGOCALCIFEROL) 1.25 MG (50000 UNIT) PO CAPS: 50000.0000 [IU] | ORAL_CAPSULE | ORAL | 0 refills | Status: DC

## 2019-10-28 NOTE — Progress Notes (Signed)
Chief Complaint:   OBESITY Carmen Wall [place] is here to discuss her progress with her obesity treatment plan along with follow-up of her obesity related diagnoses. Carmen Wall is practicing portion control and making smarter food choices, such as increasing vegetables and decreasing simple carbohydrates and states she is following her eating plan approximately 60% of the time. Carmen Wall states she is biking 2 miles 4 times per week.  Today's visit was #: 6 Starting weight: 282 lbs Starting date: 07/03/2019 Today's weight: 282 lbs Today's date: 10/24/2019 Total lbs lost to date: 0 Total lbs lost since last in-office visit: 0  Interim History: Carmen Wall just returned from vacation and voices she exercises quite a bit. She didn't eat much throughout the day, but would eat a large breakfast. She is working quite a bit now that she is back home and is going grocery shopping today. She feels she would do better with a structured meal plan.  Subjective:   Vitamin D deficiency. No nausea, vomiting, or muscle weakness. Carmen Wall endorses fatigue. She is on prescription Vitamin D. Last Vitamin D 15.5 on 07/03/2019.  Insulin resistance. Carmen Wall has a diagnosis of insulin resistance based on her elevated fasting insulin level >5. She continues to work on diet and exercise to decrease her risk of diabetes. Carmen Wall is on no medication.  Lab Results  Component Value Date   INSULIN 16.9 07/03/2019   Lab Results  Component Value Date   HGBA1C 4.9 07/03/2019   At risk for osteoporosis. Carmen Wall is at higher risk of osteopenia and osteoporosis due to Vitamin D deficiency.   Assessment/Plan:   Vitamin D deficiency. Low Vitamin D level contributes to fatigue and are associated with obesity, breast, and colon cancer. She was given a refill for Vitamin D, Ergocalciferol, (DRISDOL) 1.25 MG (50000 UNIT) CAPS capsule every week #4 with 0 refills and will follow-up for routine testing of  Vitamin D, at least 2-3 times per year to avoid over-replacement.    Insulin resistance. Carmen Wall will continue to work on weight loss, exercise, and decreasing simple carbohydrates to help decrease the risk of diabetes. Carmen Wall agreed to follow-up with [place] as directed to closely monitor her progress. She will have follow-up labs within the next month.  At risk for osteoporosis. Carmen Wall was given approximately 15 minutes of osteoporosis prevention counseling today. Carmen Wall is at risk for osteopenia and osteoporosis due to her Vitamin D deficiency. She was encouraged to take her Vitamin D and follow her higher calcium diet and increase strengthening exercise to help strengthen her bones and decrease her risk of osteopenia and osteoporosis.  Repetitive spaced learning was employed today to elicit superior memory formation and behavioral change.  Class 3 severe obesity with serious comorbidity and body mass index (BMI) of 45.0 to 49.9 in adult, unspecified obesity type (HCC).  Carmen Wall is currently in the action stage of change. As such, her goal is to continue with weight loss efforts. She has agreed to the Category 4 Plan.   Exercise goals: No exercise has been prescribed at this time.  Behavioral modification strategies: increasing lean protein intake, meal planning and cooking strategies and keeping healthy foods in the home.  Carmen Wall has agreed to follow-up with our clinic in 2-3 weeks. She was informed of the importance of frequent follow-up visits to maximize her success with intensive lifestyle modifications for her multiple health conditions.   Objective:   Pulse 80, temperature 97.9 F (36.6 C), temperature source Oral, height 5\' 6"  (1.676 Wall),  weight 282 lb (127.9 kg), last menstrual period 10/22/2019, SpO2 96 %. Body mass index is 45.52 kg/Wall.  General: Cooperative, alert, well developed, in no acute distress. HEENT: Conjunctivae and lids unremarkable. Cardiovascular:  Regular rhythm.  Lungs: Normal work of breathing. Neurologic: No focal deficits.   Lab Results  Component Value Date   CREATININE 0.79 07/03/2019   BUN 14 07/03/2019   NA 140 07/03/2019   K 4.3 07/03/2019   CL 103 07/03/2019   CO2 23 07/03/2019   Lab Results  Component Value Date   ALT 14 07/03/2019   AST 15 07/03/2019   ALKPHOS 64 07/03/2019   BILITOT 0.5 07/03/2019   Lab Results  Component Value Date   HGBA1C 4.9 07/03/2019   HGBA1C 4.9 09/05/2017   Lab Results  Component Value Date   INSULIN 16.9 07/03/2019   Lab Results  Component Value Date   TSH 0.655 07/03/2019   Lab Results  Component Value Date   CHOL 227 (H) 07/03/2019   HDL 45 07/03/2019   LDLCALC 158 (H) 07/03/2019   TRIG 133 07/03/2019   CHOLHDL 4 08/24/2018   Lab Results  Component Value Date   WBC 5.0 07/03/2019   HGB 14.4 07/03/2019   HCT 41.4 07/03/2019   MCV 92 07/03/2019   PLT 189 07/03/2019   No results found for: IRON, TIBC, FERRITIN  Attestation Statements:   Reviewed by clinician on day of visit: allergies, medications, problem list, medical history, surgical history, family history, social history, and previous encounter notes.  I, Michaelene Song, am acting as transcriptionist for Coralie Common, MD  I have reviewed the above documentation for accuracy and completeness, and I agree with the above. - Jinny Blossom, MD

## 2019-11-11 ENCOUNTER — Ambulatory Visit: Payer: BC Managed Care – PPO | Admitting: Family Medicine

## 2019-11-11 ENCOUNTER — Other Ambulatory Visit: Payer: Self-pay

## 2019-11-11 ENCOUNTER — Encounter: Payer: Self-pay | Admitting: Family Medicine

## 2019-11-11 DIAGNOSIS — G5701 Lesion of sciatic nerve, right lower limb: Secondary | ICD-10-CM

## 2019-11-11 HISTORY — DX: Lesion of sciatic nerve, right lower limb: G57.01

## 2019-11-11 MED ORDER — PREDNISONE 5 MG PO TABS
ORAL_TABLET | ORAL | 0 refills | Status: DC
Start: 2019-11-11 — End: 2019-11-21

## 2019-11-11 NOTE — Progress Notes (Signed)
Carmen Wall [place] - 29 y.o. female MRN [id number]  Date of birth: 1990-11-30  SUBJECTIVE:  Including CC & ROS.  Chief Complaint  Patient presents with  . Hip Pain    right    Carmen Wall [place] is a 29 y.o. female that is presenting with right leg pain.  The symptoms been ongoing for 2 months.  Denies any specific inciting event.  It is worse when she sits on the affected side.  It is more problematic towards the end of the day or walking up and down several steps.  She has not had any improvement with modalities to date.  No previous surgery to the pelvis or the hip.  Symptoms seem to be ongoing with little to no improvement..  Independent review of the lumbar spine x-ray from 2019 shows limited views due to the nature of the x-ray. Independent review of CT abdomen and pelvis she has no significant degenerative changes of the pelvis or lumbar spine.  Review of Systems See HPI   HISTORY: Past Medical, Surgical, Social, and Family History Reviewed & Updated per EMR.   Pertinent Historical Findings include:  Past Medical History:  Diagnosis Date  . ADHD   . Allergy   . Anxiety   . Anxiety   . Arthritis   . Back pain   . Bilateral swelling of feet   . Chronic fatigue syndrome   . Constipation   . Depression   . IBS (irritable bowel syndrome)   . Joint pain   . Lactose intolerance   . Migraine without aura     Past Surgical History:  Procedure Laterality Date  . INTRAUTERINE DEVICE (IUD) INSERTION  04/2017   Mirena   . KNEE ARTHROSCOPY Right     Family History  Problem Relation Age of Onset  . Diabetes Father   . Arthritis Father   . Depression Father   . Mental illness Father        PTSD, anxiety  . Hypertension Father   . Hyperlipidemia Father   . Anxiety disorder Father   . Bipolar disorder Father   . Sleep apnea Father   . Alcoholism Father   . Obesity Father   . Arthritis Mother        osteoartthritis  . Fibroids Mother   . Ovarian cysts Mother   .  Obesity Mother   . Kidney disease Maternal Grandmother   . Arthritis Maternal Grandmother   . Prostate cancer Maternal Grandfather   . Arthritis Maternal Grandfather   . Arthritis Paternal Grandmother   . Diabetes Paternal Grandfather   . Mental illness Paternal Grandfather   . Arthritis Paternal Grandfather     Social History   Socioeconomic History  . Marital status: Legally Separated    Spouse name: Not on file  . Number of children: Not on file  . Years of education: Not on file  . Highest education level: Not on file  Occupational History  . Not on file  Tobacco Use  . Smoking status: Never Smoker  . Smokeless tobacco: Never Used  Substance and Sexual Activity  . Alcohol use: No    Alcohol/week: 0.0 standard drinks  . Drug use: No  . Sexual activity: Yes    Partners: Male    Birth control/protection: I.U.D.  Other Topics Concern  . Not on file  Social History Narrative  . Not on file   Social Determinants of Health   Financial Resource Strain:   . Difficulty of Paying  Living Expenses:   Food Insecurity:   . Worried About Charity fundraiser in the Last Year:   . Arboriculturist in the Last Year:   Transportation Needs:   . Film/video editor (Medical):   Marland Kitchen Lack of Transportation (Non-Medical):   Physical Activity:   . Days of Exercise per Week:   . Minutes of Exercise per Session:   Stress:   . Feeling of Stress :   Social Connections:   . Frequency of Communication with Friends and Family:   . Frequency of Social Gatherings with Friends and Family:   . Attends Religious Services:   . Active Member of Clubs or Organizations:   . Attends Archivist Meetings:   Marland Kitchen Marital Status:   Intimate Partner Violence:   . Fear of Current or Ex-Partner:   . Emotionally Abused:   Marland Kitchen Physically Abused:   . Sexually Abused:      PHYSICAL EXAM:  VS: BP 135/84   Pulse 81   Ht 5\' 7"  (1.702 Wall)   Wt 280 lb (127 kg)   LMP 10/22/2019   BMI 43.85 kg/Wall   Physical Exam Gen: NAD, alert, cooperative with exam, well-appearing MSK:  Right hip: Normal internal and external rotation of the hip. Normal strength to resistance with hip flexion. Weakness with hip abduction on the right when compared to the left. Negative straight leg raise. No signs of atrophy. Neurovascular intact     ASSESSMENT & PLAN:   Piriformis syndrome of right side She has hip abduction weakness more significant on the right than the left.  Possible that this leading to impingement versus piriformis syndrome.  She seems to have more radicular symptoms that seems less likely to be originating from the lumbar spine.  She has been seeing chiropractor and getting adjusted with little improvement. -Counseled on home exercise therapy and supportive care. -Prednisone. -Would consider trochanteric injection versus physical therapy if no improvement.

## 2019-11-11 NOTE — Assessment & Plan Note (Signed)
She has hip abduction weakness more significant on the right than the left.  Possible that this leading to impingement versus piriformis syndrome.  She seems to have more radicular symptoms that seems less likely to be originating from the lumbar spine.  She has been seeing chiropractor and getting adjusted with little improvement. -Counseled on home exercise therapy and supportive care. -Prednisone. -Would consider trochanteric injection versus physical therapy if no improvement.

## 2019-11-11 NOTE — Patient Instructions (Signed)
Good to see you Please try the exercises  Please continue ice  Please let me know if you still have pain after the prednisone  Please send me a message in MyChart with any questions or updates.  Please see me back in 4 weeks.   --Dr. Jordan Likes

## 2019-11-20 ENCOUNTER — Ambulatory Visit (INDEPENDENT_AMBULATORY_CARE_PROVIDER_SITE_OTHER): Payer: BC Managed Care – PPO | Admitting: Family Medicine

## 2019-11-21 ENCOUNTER — Ambulatory Visit (INDEPENDENT_AMBULATORY_CARE_PROVIDER_SITE_OTHER): Payer: BC Managed Care – PPO | Admitting: Family Medicine

## 2019-11-21 ENCOUNTER — Encounter (INDEPENDENT_AMBULATORY_CARE_PROVIDER_SITE_OTHER): Payer: Self-pay | Admitting: Family Medicine

## 2019-11-21 ENCOUNTER — Other Ambulatory Visit: Payer: Self-pay

## 2019-11-21 VITALS — BP 112/80 | HR 73 | Temp 98.1°F | Ht 67.0 in | Wt 287.0 lb

## 2019-11-21 DIAGNOSIS — Z6841 Body Mass Index (BMI) 40.0 and over, adult: Secondary | ICD-10-CM

## 2019-11-21 DIAGNOSIS — E8881 Metabolic syndrome: Secondary | ICD-10-CM | POA: Diagnosis not present

## 2019-11-21 DIAGNOSIS — Z9189 Other specified personal risk factors, not elsewhere classified: Secondary | ICD-10-CM

## 2019-11-21 DIAGNOSIS — E559 Vitamin D deficiency, unspecified: Secondary | ICD-10-CM | POA: Diagnosis not present

## 2019-11-21 DIAGNOSIS — F3289 Other specified depressive episodes: Secondary | ICD-10-CM

## 2019-11-22 LAB — LIPID PANEL WITH LDL/HDL RATIO
Cholesterol, Total: 205 mg/dL — ABNORMAL HIGH (ref 100–199)
HDL: 47 mg/dL (ref >39)
LDL Chol Calc (NIH): 139 mg/dL — ABNORMAL HIGH (ref 0–99)
LDL/HDL Ratio: 3 ratio (ref 0.0–3.2)
Triglycerides: 108 mg/dL (ref 0–149)
VLDL Cholesterol Cal: 19 mg/dL (ref 5–40)

## 2019-11-22 LAB — VITAMIN D 25 HYDROXY (VIT D DEFICIENCY, FRACTURES): Vit D, 25-Hydroxy: 24.1 ng/mL — ABNORMAL LOW (ref 30.0–100.0)

## 2019-11-22 LAB — INSULIN, RANDOM: INSULIN: 14.9 u[IU]/mL (ref 2.6–24.9)

## 2019-11-23 ENCOUNTER — Other Ambulatory Visit (INDEPENDENT_AMBULATORY_CARE_PROVIDER_SITE_OTHER): Payer: Self-pay | Admitting: Family Medicine

## 2019-11-23 DIAGNOSIS — E559 Vitamin D deficiency, unspecified: Secondary | ICD-10-CM

## 2019-11-25 ENCOUNTER — Encounter (INDEPENDENT_AMBULATORY_CARE_PROVIDER_SITE_OTHER): Payer: Self-pay | Admitting: Family Medicine

## 2019-11-25 DIAGNOSIS — E8881 Metabolic syndrome: Secondary | ICD-10-CM | POA: Insufficient documentation

## 2019-11-25 DIAGNOSIS — E559 Vitamin D deficiency, unspecified: Secondary | ICD-10-CM | POA: Insufficient documentation

## 2019-11-25 DIAGNOSIS — E88819 Insulin resistance, unspecified: Secondary | ICD-10-CM | POA: Insufficient documentation

## 2019-11-25 HISTORY — DX: Vitamin D deficiency, unspecified: E55.9

## 2019-11-25 NOTE — Progress Notes (Signed)
Chief Complaint:   OBESITY Carmen Wall is here to discuss her progress with her obesity treatment plan along with follow-up of her obesity related diagnoses. Carmen Wall is on the Category 4 Plan and states she is following her eating plan approximately 40% of the time. Carmen Wall states she is doing 0 minutes 0 times per week.  Today's visit was #: 7 Starting weight: 282 lbs Starting date: 07/03/2019 Today's weight: 287 lbs Today's date: 11/21/2019 Total lbs lost to date: 0 Total lbs lost since last in-office visit: 0  Interim History: Carmen Wall notes working quite a bit and has been inconsistent with the plan. She has been on the plan on workdays at breakfast and lunch. She notes significant cravings. She does get all of her protein in.  Subjective:   1. Insulin resistance Carmen Wall has a diagnosis of insulin resistance based on her elevated fasting insulin level >5. She notes some hunger. She is not on metformin. She continues to work on diet and exercise to decrease her risk of diabetes.  Lab Results  Component Value Date   INSULIN 14.9 11/21/2019   INSULIN 16.9 07/03/2019   Lab Results  Component Value Date   HGBA1C 4.9 07/03/2019   2. Vitamin D deficiency Carmen Wall's last Vit D level was very low at 15.5. She is on prescription Vit D q weekly.  3. Other depression with emotional eating  Carmen Wall notes considerable cravings, worse on her days off. She is on Wellbutrin and Topamax.  4. At risk for diabetes mellitus Carmen Wall is at higher than average risk for developing diabetes due to her obesity and insulin resistance.   Assessment/Plan:   1. Insulin resistance Carmen Wall will continue her meal plan, and will continue to work on weight loss, exercise, and decreasing simple carbohydrates to help decrease the risk of diabetes. We will check labs today. Carmen Wall agreed to follow-up with [place] as directed to closely monitor her progress.  - Insulin, random -  Lipid Panel With LDL/HDL Ratio  2. Vitamin D deficiency Low Vitamin D level contributes to fatigue and are associated with obesity, breast, and colon cancer. Carmen Wall agreed to continue taking prescription Vitamin D 50,000 IU every week, and we will check labs today. She will follow-up for routine testing of Vitamin D, at least 2-3 times per year to avoid over-replacement.  - VITAMIN D 25 Hydroxy (Vit-D Deficiency, Fractures)  3. Other depression with emotional eating  Behavior modification techniques were discussed today to help Carmen Wall deal with her emotional/non-hunger eating behaviors. She will consider seeing Dr. Mallie Mussel, our Bariatric Psychologist for evaluation. Orders and follow up as documented in patient record.   4. At risk for diabetes mellitus Carmen Wall was given approximately 15 minutes of diabetes education and counseling today. We discussed intensive lifestyle modifications today with an emphasis on weight loss as well as increasing exercise and decreasing simple carbohydrates in her diet. We also reviewed medication options with an emphasis on risk versus benefit of those discussed.   Repetitive spaced learning was employed today to elicit superior memory formation and behavioral change.  5. Class 3 severe obesity with serious comorbidity and body mass index (BMI) of 45.0 to 49.9 in adult, unspecified obesity type (HCC) Carmen Wall is currently in the action stage of change. As such, her goal is to continue with weight loss efforts. She has agreed to the Category 4 Plan and keeping a food journal and adhering to recommended goals of 550-700 calories and 45 grams of protein at supper daily.  We discussed Bernie Covey and Fatisha will research this. She will reduce her soda and will have with supper at times.  Exercise goals: No exercise has been prescribed at this time.  Behavioral modification strategies: increasing lean protein intake, decreasing simple carbohydrates and  decreasing liquid calories.  Carmen Wall has agreed to follow-up with our clinic in 2 to 3 weeks. She was informed of the importance of frequent follow-up visits to maximize her success with intensive lifestyle modifications for her multiple health conditions.   Carmen Wall was informed we would discuss her lab results at her next visit unless there is a critical issue that needs to be addressed sooner. Carmen Wall agreed to keep her next visit at the agreed upon time to discuss these results.  Objective:   Blood pressure 112/80, pulse 73, temperature 98.1 F (36.7 C), temperature source Oral, height 5\' 7"  (1.702 m), weight 287 lb (130.2 kg), last menstrual period 10/22/2019, SpO2 99 %. Body mass index is 44.95 kg/m.  General: Cooperative, alert, well developed, in no acute distress. HEENT: Conjunctivae and lids unremarkable. Cardiovascular: Regular rhythm.  Lungs: Normal work of breathing. Neurologic: No focal deficits.   Lab Results  Component Value Date   CREATININE 0.79 07/03/2019   BUN 14 07/03/2019   NA 140 07/03/2019   K 4.3 07/03/2019   CL 103 07/03/2019   CO2 23 07/03/2019   Lab Results  Component Value Date   ALT 14 07/03/2019   AST 15 07/03/2019   ALKPHOS 64 07/03/2019   BILITOT 0.5 07/03/2019   Lab Results  Component Value Date   HGBA1C 4.9 07/03/2019   HGBA1C 4.9 09/05/2017   Lab Results  Component Value Date   INSULIN 14.9 11/21/2019   INSULIN 16.9 07/03/2019   Lab Results  Component Value Date   TSH 0.655 07/03/2019   Lab Results  Component Value Date   CHOL 205 (H) 11/21/2019   HDL 47 11/21/2019   LDLCALC 139 (H) 11/21/2019   TRIG 108 11/21/2019   CHOLHDL 4 08/24/2018   Lab Results  Component Value Date   WBC 5.0 07/03/2019   HGB 14.4 07/03/2019   HCT 41.4 07/03/2019   MCV 92 07/03/2019   PLT 189 07/03/2019   No results found for: IRON, TIBC, FERRITIN  Attestation Statements:   Reviewed by clinician on day of visit: allergies,  medications, problem list, medical history, surgical history, family history, social history, and previous encounter notes.   07/05/2019, am acting as Trude Mcburney for Energy manager, FNP-C.  I have reviewed the above documentation for accuracy and completeness, and I agree with the above. -  [place], FNP

## 2019-12-05 ENCOUNTER — Encounter (INDEPENDENT_AMBULATORY_CARE_PROVIDER_SITE_OTHER): Payer: Self-pay

## 2019-12-10 ENCOUNTER — Ambulatory Visit (INDEPENDENT_AMBULATORY_CARE_PROVIDER_SITE_OTHER): Payer: Managed Care, Other (non HMO) | Admitting: Family Medicine

## 2019-12-10 ENCOUNTER — Other Ambulatory Visit: Payer: Self-pay

## 2019-12-10 ENCOUNTER — Encounter: Payer: Self-pay | Admitting: Family Medicine

## 2019-12-10 ENCOUNTER — Encounter (INDEPENDENT_AMBULATORY_CARE_PROVIDER_SITE_OTHER): Payer: Self-pay | Admitting: Family Medicine

## 2019-12-10 VITALS — BP 108/71 | HR 77 | Temp 98.2°F | Ht 66.0 in | Wt 289.0 lb

## 2019-12-10 VITALS — BP 122/78 | HR 98 | Ht 67.0 in | Wt 289.0 lb

## 2019-12-10 DIAGNOSIS — G5701 Lesion of sciatic nerve, right lower limb: Secondary | ICD-10-CM

## 2019-12-10 DIAGNOSIS — E7849 Other hyperlipidemia: Secondary | ICD-10-CM | POA: Diagnosis not present

## 2019-12-10 DIAGNOSIS — Z9189 Other specified personal risk factors, not elsewhere classified: Secondary | ICD-10-CM

## 2019-12-10 DIAGNOSIS — E559 Vitamin D deficiency, unspecified: Secondary | ICD-10-CM

## 2019-12-10 DIAGNOSIS — Z6841 Body Mass Index (BMI) 40.0 and over, adult: Secondary | ICD-10-CM

## 2019-12-10 MED ORDER — VITAMIN D (ERGOCALCIFEROL) 1.25 MG (50000 UNIT) PO CAPS: 50000.0000 [IU] | ORAL_CAPSULE | ORAL | 0 refills | Status: DC

## 2019-12-10 NOTE — Assessment & Plan Note (Signed)
Symptoms occurring intermittently but pain for the most part has improved. -Counseled on home exercise therapy and supportive care. -Referral to physical therapy. -Could consider MRI if needed. -Provided work note.

## 2019-12-10 NOTE — Progress Notes (Signed)
[name] [place] - 29 y.o. female MRN [id number]  Date of birth: [birth year]  SUBJECTIVE:  Including CC & ROS.  Chief Complaint  Patient presents with  . Follow-up    right hip    Carmen Wall [place] is a 29 y.o. female that is following up for her right leg pain.  Her symptoms have improved with the home exercise therapy.  She is able to do light duty at work as well.  She still has some vague symptoms intermittently in the leg.  Review of Systems See HPI   HISTORY: Past Medical, Surgical, Social, and Family History Reviewed & Updated per EMR.   Pertinent Historical Findings include:  Past Medical History:  Diagnosis Date  . ADHD   . Allergy   . Anxiety   . Anxiety   . Arthritis   . Back pain   . Bilateral swelling of feet   . Chronic fatigue syndrome   . Constipation   . Depression   . IBS (irritable bowel syndrome)   . Joint pain   . Lactose intolerance   . Migraine without aura     Past Surgical History:  Procedure Laterality Date  . INTRAUTERINE DEVICE (IUD) INSERTION  04/2017   Mirena   . KNEE ARTHROSCOPY Right     Family History  Problem Relation Age of Onset  . Diabetes Father   . Arthritis Father   . Depression Father   . Mental illness Father        PTSD, anxiety  . Hypertension Father   . Hyperlipidemia Father   . Anxiety disorder Father   . Bipolar disorder Father   . Sleep apnea Father   . Alcoholism Father   . Obesity Father   . Arthritis Mother        osteoartthritis  . Fibroids Mother   . Ovarian cysts Mother   . Obesity Mother   . Kidney disease Maternal Grandmother   . Arthritis Maternal Grandmother   . Prostate cancer Maternal Grandfather   . Arthritis Maternal Grandfather   . Arthritis Paternal Grandmother   . Diabetes Paternal Grandfather   . Mental illness Paternal Grandfather   . Arthritis Paternal Grandfather     Social History   Socioeconomic History  . Marital status: Legally Separated    Spouse name: Not on file   . Number of children: Not on file  . Years of education: Not on file  . Highest education level: Not on file  Occupational History  . Not on file  Tobacco Use  . Smoking status: Never Smoker  . Smokeless tobacco: Never Used  Vaping Use  . Vaping Use: Never used  Substance and Sexual Activity  . Alcohol use: No    Alcohol/week: 0.0 standard drinks  . Drug use: No  . Sexual activity: Yes    Partners: Male    Birth control/protection: I.U.D.  Other Topics Concern  . Not on file  Social History Narrative  . Not on file   Social Determinants of Health   Financial Resource Strain:   . Difficulty of Paying Living Expenses:   Food Insecurity:   . Worried About Programme researcher, broadcasting/film/video in the Last Year:   . Barista in the Last Year:   Transportation Needs:   . Freight forwarder (Medical):   Marland Kitchen Lack of Transportation (Non-Medical):   Physical Activity:   . Days of Exercise per Week:   . Minutes of Exercise per Session:  Stress:   . Feeling of Stress :   Social Connections:   . Frequency of Communication with Friends and Family:   . Frequency of Social Gatherings with Friends and Family:   . Attends Religious Services:   . Active Member of Clubs or Organizations:   . Attends Banker Meetings:   Marland Kitchen Marital Status:   Intimate Partner Violence:   . Fear of Current or Ex-Partner:   . Emotionally Abused:   Marland Kitchen Physically Abused:   . Sexually Abused:      PHYSICAL EXAM:  VS: BP 122/78   Pulse 98   Ht 5\' 7"  (1.702 Wall)   Wt 289 lb (131.1 kg)   BMI 45.26 kg/Wall  Physical Exam Gen: NAD, alert, cooperative with exam, well-appearing MSK:  Right leg: Normal range of motion. Normal strength resistance. Neurovascular intact     ASSESSMENT & PLAN:   Piriformis syndrome of right side Symptoms occurring intermittently but pain for the most part has improved. -Counseled on home exercise therapy and supportive care. -Referral to physical therapy. -Could  consider MRI if needed. -Provided work note.

## 2019-12-10 NOTE — Patient Instructions (Signed)
Good to see you  Please continue the light duty and let us know if there is anything to fill out  Physical therapy will give you a call  Please send me a message in MyChart with any questions or updates.  Please see me back in 6 weeks.   --Dr. Jordan Likes

## 2019-12-11 ENCOUNTER — Ambulatory Visit: Payer: BC Managed Care – PPO | Admitting: Family Medicine

## 2019-12-12 ENCOUNTER — Encounter (INDEPENDENT_AMBULATORY_CARE_PROVIDER_SITE_OTHER): Payer: Self-pay | Admitting: Family Medicine

## 2019-12-12 DIAGNOSIS — E7849 Other hyperlipidemia: Secondary | ICD-10-CM

## 2019-12-12 HISTORY — DX: Other hyperlipidemia: E78.49

## 2019-12-12 NOTE — Progress Notes (Signed)
Chief Complaint:   OBESITY Carmen Wall is here to discuss her progress with her obesity treatment plan along with follow-up of her obesity related diagnoses. Carmen Wall is on the Category 4 Plan and keeping a food journal and adhering to recommended goals of 550-700 calories and 45 grams of protein daily and states she is following her eating plan approximately 50% of the time. Carmen Wall states she is doing 0 minutes 0 times per week.  Today's visit was #: 8 Starting weight: 282 lbs Starting date: 07/03/2019 Today's weight: 289 lbs Today's date: 12/10/2019 Total lbs lost to date: 0 Total lbs lost since last in-office visit: 0  Interim History: Carmen Wall has cut out soda which is a big step for her. She is being treated for hip pain and has been unable to exercise. She hopes to be cleared for exercise by Ortho today. She notes cravings at night, especially for sweets. She is starting [employer] plan soon (5 "Fuelings and protein/vegetable meal).  Subjective:   1. Vitamin D deficiency Carmen Wall is on prescription Vit D. Her Vit D level has increased but not by much. She is sporadically taking Vit D.  2. Other hyperlipidemia Carmen Wall has hyperlipidemia and has been trying to improve her cholesterol levels with intensive lifestyle modification including a low saturated fat diet, exercise and weight loss. Last LDL has improved to 139 from 158. Her HDL was low at 47 and triglycerides within normal limits. She is not on statin. She denies any chest pain, claudication or myalgias.  Lab Results  Component Value Date   ALT 14 07/03/2019   AST 15 07/03/2019   ALKPHOS 64 07/03/2019   BILITOT 0.5 07/03/2019   Lab Results  Component Value Date   CHOL 205 (H) 11/21/2019   HDL 47 11/21/2019   LDLCALC 139 (H) 11/21/2019   TRIG 108 11/21/2019   CHOLHDL 4 08/24/2018   3. At risk for heart disease Carmen Wall is at a higher than average risk for cardiovascular disease due to obesity,  hyperlipidemia, and family history of CAD.   Assessment/Plan:   1. Vitamin D deficiency Low Vitamin D level contributes to fatigue and are associated with obesity, breast, and colon cancer. We will refill prescription Vitamin D for 1 month. Carmen Wall will follow-up for routine testing of Vitamin D, at least 2-3 times per year to avoid over-replacement.  - Vitamin D, Ergocalciferol, (DRISDOL) 1.25 MG (50000 UNIT) CAPS capsule; Take 1 capsule (50,000 Units total) by mouth every 7 (seven) days.  Dispense: 4 capsule; Refill: 0  2. Other hyperlipidemia Cardiovascular risk and specific lipid/LDL goals reviewed. We discussed several lifestyle modifications today. Carmen Wall will continue her meal plan, and will continue to work on diet, exercise and weight loss efforts.   3. At risk for heart disease Carmen Wall was given approximately 15 minutes of coronary artery disease prevention counseling today. She is 29 y.o. female and has risk factors for heart disease including obesity. We discussed intensive lifestyle modifications today with an emphasis on specific weight loss instructions and strategies.   Repetitive spaced learning was employed today to elicit superior memory formation and behavioral change.  4. Class 3 severe obesity with serious comorbidity and body mass index (BMI) of 45.0 to 49.9 in adult, unspecified obesity type (HCC) Carmen Wall is currently in the action stage of change. As such, her goal is to continue with weight loss efforts. She has agreed to practicing portion control and making smarter food choices, such as increasing vegetables and decreasing simple carbohydrates/[employer].  I advised her to make sure she eats at least with 80 grams of protein per day while on the [employer] plan. We discussed possibility of slowed metabolism and insufficient protein intake with the [employer] plan.    Exercise goals: No exercise has been prescribed at this time.  Behavioral modification  strategies: increasing lean protein intake and decreasing simple carbohydrates.  Carmen Wall has agreed to follow-up with our clinic in 3 weeks. She was informed of the importance of frequent follow-up visits to maximize her success with intensive lifestyle modifications for her multiple health conditions.   Objective:   Blood pressure 108/71, pulse 77, temperature 98.2 F (36.8 C), temperature source Oral, height 5\' 6"  (1.676 m), weight 289 lb (131.1 kg), SpO2 97 %. Body mass index is 46.65 kg/m.  General: Cooperative, alert, well developed, in no acute distress. HEENT: Conjunctivae and lids unremarkable. Cardiovascular: Regular rhythm.  Lungs: Normal work of breathing. Neurologic: No focal deficits.   Lab Results  Component Value Date   CREATININE 0.79 07/03/2019   BUN 14 07/03/2019   NA 140 07/03/2019   K 4.3 07/03/2019   CL 103 07/03/2019   CO2 23 07/03/2019   Lab Results  Component Value Date   ALT 14 07/03/2019   AST 15 07/03/2019   ALKPHOS 64 07/03/2019   BILITOT 0.5 07/03/2019   Lab Results  Component Value Date   HGBA1C 4.9 07/03/2019   HGBA1C 4.9 09/05/2017   Lab Results  Component Value Date   INSULIN 14.9 11/21/2019   INSULIN 16.9 07/03/2019   Lab Results  Component Value Date   TSH 0.655 07/03/2019   Lab Results  Component Value Date   CHOL 205 (H) 11/21/2019   HDL 47 11/21/2019   LDLCALC 139 (H) 11/21/2019   TRIG 108 11/21/2019   CHOLHDL 4 08/24/2018   Lab Results  Component Value Date   WBC 5.0 07/03/2019   HGB 14.4 07/03/2019   HCT 41.4 07/03/2019   MCV 92 07/03/2019   PLT 189 07/03/2019   No results found for: IRON, TIBC, FERRITIN  Attestation Statements:   Reviewed by clinician on day of visit: allergies, medications, problem list, medical history, surgical history, family history, social history, and previous encounter notes.   07/05/2019, am acting as Trude Mcburney for Energy manager, FNP-C.  I have reviewed the above  documentation for accuracy and completeness, and I agree with the above. -  [place], FNP

## 2019-12-23 ENCOUNTER — Ambulatory Visit: Payer: Managed Care, Other (non HMO) | Attending: Family Medicine | Admitting: Physical Therapy

## 2019-12-23 ENCOUNTER — Other Ambulatory Visit: Payer: Self-pay

## 2019-12-23 ENCOUNTER — Encounter: Payer: Self-pay | Admitting: Family Medicine

## 2019-12-23 DIAGNOSIS — R262 Difficulty in walking, not elsewhere classified: Secondary | ICD-10-CM

## 2019-12-23 DIAGNOSIS — R29898 Other symptoms and signs involving the musculoskeletal system: Secondary | ICD-10-CM | POA: Insufficient documentation

## 2019-12-23 DIAGNOSIS — M533 Sacrococcygeal disorders, not elsewhere classified: Secondary | ICD-10-CM | POA: Diagnosis present

## 2019-12-23 DIAGNOSIS — M62838 Other muscle spasm: Secondary | ICD-10-CM

## 2019-12-23 NOTE — Therapy (Signed)
[facility] [place] [address]  [place], [place], [address] Phone: [phone]   Fax:  [phone]  Physical Therapy Evaluation  Patient Details  Name: Carmen Wall MRN: [id number] Date of Birth: [birth year] Referring Provider (PT): Clearance Coots, MD   Encounter Date: 12/23/2019   PT End of Session - 12/23/19 1617    Visit Number 1    Number of Visits 8    Date for PT Re-Evaluation 02/17/20    Authorization Type Cigna    PT Start Time 7989    PT Stop Time 1705    PT Time Calculation (min) 48 min    Activity Tolerance Patient tolerated treatment well    Behavior During Therapy [facility] for tasks assessed/performed           Past Medical History:  Diagnosis Date  . ADHD   . Allergy   . Anxiety   . Anxiety   . Arthritis   . Back pain   . Bilateral swelling of feet   . Chronic fatigue syndrome   . Constipation   . Depression   . IBS (irritable bowel syndrome)   . Joint pain   . Lactose intolerance   . Migraine without aura     Past Surgical History:  Procedure Laterality Date  . INTRAUTERINE DEVICE (IUD) INSERTION  04/2017   Mirena   . KNEE ARTHROSCOPY Right     There were no vitals filed for this visit.    Subjective Assessment - 12/23/19 1619    Subjective "My R hip and leg does not like me." Severe sharp pain down whole R leg. Was told she has inflammation of piriformis muscle causing pressure on sciatic nerve which will cause her leg to go numb. Has been put on light duty at work until [employer].    Pertinent History R meniscal surgery 2008    Limitations Sitting;Standing;Walking    How long can you sit comfortably? 1 hr    How long can you stand comfortably? has to shift weight often    How long can you walk comfortably? 1 hr    Patient Stated Goals "find something to help relieve the tighness and pain"    Currently in Pain? Yes    Pain Score 3    3-4/10, up to 11/10 at worst   Pain Location  Buttocks    Pain Orientation Right    Pain Descriptors / Indicators Aching;Discomfort;Cramping    Pain Type Acute pain    Pain Radiating Towards wrap around hip to groin    Pain Onset More than a month ago   4-6 months   Pain Frequency Constant    Aggravating Factors  stairs, prolonged walking, driving, prolonged sitting    Pain Relieving Factors "sleeping it off", muscle relaxant, ice>heat, stretch    Effect of Pain on Daily Activities limits tolerance for work (esp climbing stairs), leg will go numb while driving              [facility] PT Assessment - 12/23/19 1617      Assessment   Medical Diagnosis R piriformis syndrome    Referring Provider (PT) Clearance Coots, MD    Onset Date/Surgical Date --   4-6 months   Next MD Visit TBD ~6 weeks    Prior Therapy none for current problem, PT after knee surgery 2008      Precautions   Precautions None      Restrictions   Weight Bearing  Restrictions No      Balance Screen   Has the patient fallen in the past 6 months No    Has the patient had a decrease in activity level because of a fear of falling?  No    Is the patient reluctant to leave their home because of a fear of falling?  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; no regular exercise      Cognition   Overall Cognitive Status Within Functional Limits for tasks assessed      ROM / Strength   AROM / PROM / Strength AROM;Strength      AROM   Overall AROM  Within functional limits for tasks performed    AROM Assessment Site Hip      Strength   Strength Assessment Site Hip;Knee    Right/Left Hip Right;Left    Right Hip  Flexion 5/5    Right Hip Extension 4+/5    Right Hip External Rotation  4/5    Right Hip Internal Rotation 4+/5    Right Hip ABduction 4+/5    Right Hip ADduction 4+/5    Left Hip Flexion 5/5    Left Hip Extension 4+/5    Left Hip External Rotation 4+/5    Left Hip Internal Rotation 5/5    Left Hip ABduction 4+/5    Left Hip ADduction 4+/5    Right/Left Knee Right;Left    Right Knee Flexion 5/5    Right Knee Extension 5/5    Left Knee Flexion 5/5    Left Knee Extension 5/5      Flexibility   Soft Tissue Assessment /Muscle Length yes    Hamstrings very mild tightness B    ITB mild tightness B    Piriformis WFL to very mild tightness B    Quadratus Lumborum mild tightness B      Palpation   Spinal mobility slight hypomobility of sacrum and lower lumbar vertebrae    SI assessment  apparent leg length discrepancy (R LE longer) with R posterior innominate rotation of pelvis on sacrum    Palpation comment ttp over lumbar parasinals & proximal glutes > piriformis                      Objective measurements completed on examination: See above findings.       [facility] Adult PT Treatment/Exercise - 12/23/19 1617      Lumbar Exercises: Stretches   Standing Side Bend Right;30 seconds;2 reps    Standing Side Bend Limitations QL/ITB stretch at wall    ITB Stretch Right;30 seconds;3 reps    ITB Stretch Limitations attempted multiple supine, sidelying and standing versions but pt not noting significant stretch    Piriformis Stretch Right;30 seconds;1 rep    Piriformis Stretch Limitations KTOS    Other Lumbar Stretch Exercise Side lying R QL stretch 2 x 30 sec      Manual Therapy   Manual Therapy Muscle Energy Technique    Muscle Energy Technique MET for correction of apparent R  posterior innominate rotation of pelvis on sacrum - alignment appears corrected following MMT with pt noting some relief of pain                  PT Education - 12/23/19 1705     Education Details PT eval findings, anticipated POC, initial HEP    Person(s) Educated Patient    Methods Explanation;Demonstration;Handout    Comprehension Verbalized understanding;Returned demonstration;Need further instruction            PT Short Term Goals - 12/23/19 1705      PT SHORT TERM GOAL #1   Title Patient will be independent with initial HEP    Status New    Target Date 01/13/20      PT SHORT TERM GOAL #2   Title Patient to demonstrate improved tissue quality and pliability with reduced pain    Status New    Target Date 01/20/20             PT Long Term Goals - 12/23/19 1705      PT LONG TERM GOAL #1   Title Patient will be independent with ongoing/advanced HEP    Status New    Target Date 02/17/20      PT LONG TERM GOAL #2   Title Patient to report pain reduction in frequency and intensity by >/= 50%    Status New    Target Date 02/17/20      PT LONG TERM GOAL #3   Title Patient will negotiate stairs reciprocally with normal step pattern w/o limitation due to R LE pain or weakness    Status New    Target Date 02/17/20      PT LONG TERM GOAL #4   Title Patient to report ability to perform work and daily activities without pain provocation    Status New    Target Date 02/17/20                  Plan - 12/23/19 1705    Clinical Impression Statement Arlynn is a 29 y/o female who presents to OP PT for R hip and leg pain attributed to piriformis syndrome. She reports onset of pain 4-6 months ago w/o known MOI but initially presumed pain was just sciatica. Worsening pain led her to seek MD care where she was told her radicular pain was due to piriformis syndrome. Assessment revealed apparent R posterior innominate rotation of pelvis on sacrum resulting in apparent lengthening of R LE - corrected via MET with pt noting some relief of pain. Palpation localizing ttp and increased muscle tension more to lumbar paraspinals, R QL and R proximal glutes  with only mild ttp and tension noted in R piriformis. Proximal LE strength essentially WFL/WNL other than mild weakness in R hip ER. Pain limits positional and walking tolerance, especially with climbing stairs which she needs to do 30+ times a day when working (currently on light duty). Makaiya will benefit from skilled PT intervention to address the above listed deficits and to allow for improved functional mobility, walking and stair tolerance with decreased pain to allow her to return to full duty at work w/o pain interference.    Personal Factors and Comorbidities Comorbidity 3+;Time since onset of injury/illness/exacerbation;Past/Current Experience;Fitness    Comorbidities Chronic R LBP with R sciatica, OA, R knee pain (Jumper's knee), obesity, ADHD, anxiety and depression    Examination-Activity Limitations Locomotion Level;Sit;Squat;Stairs;Stand    Examination-Participation Restrictions Community Activity;Driving;Shop    Stability/Clinical Decision Making  Evolving/Moderate complexity    Clinical Decision Making Moderate    Rehab Potential Good    PT Frequency 1x / week   1x/wk due to copay   PT Duration 8 weeks    PT Treatment/Interventions ADLs/Self Care Home Management;Cryotherapy;Electrical Stimulation;Iontophoresis 62m/ml Dexamethasone;Moist Heat;Ultrasound;Gait training;Stair training;Functional mobility training;Therapeutic activities;Therapeutic exercise;Balance training;Neuromuscular re-education;Patient/family education;Manual techniques;Passive range of motion;Dry needling;Taping;Spinal Manipulations;Joint Manipulations    PT Next Visit Plan Reassess SIJ alignment; manual therapy including possible DN to address increased muscle tension; lumbopelvic flexibility and strengthening with HEP update; modalities PRN    PT Home Exercise Plan 7/19 - QL & glute/piriformis stretches    Consulted and Agree with Plan of Care Patient           Patient will benefit from skilled therapeutic  intervention in order to improve the following deficits and impairments:  Decreased activity tolerance, Decreased mobility, Decreased strength, Difficulty walking, Increased fascial restricitons, Increased muscle spasms, Impaired perceived functional ability, Impaired flexibility, Impaired sensation, Improper body mechanics, Postural dysfunction, Pain  Visit Diagnosis: Other symptoms and signs involving the musculoskeletal system  Sacrococcygeal disorders, not elsewhere classified  Other muscle spasm  Difficulty in walking, not elsewhere classified     Problem List Patient Active Problem List   Diagnosis Date Noted  . Other hyperlipidemia 12/12/2019  . 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 Vitamin D deficiency 11/25/2019  . Piriformis syndrome of right side 11/11/2019  . Cough variant asthma 09/26/2019  . Excessive daytime sleepiness 03/13/2019  . 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 Acute right ankle pain 10/24/2018  . Class 3 severe obesity with serious comorbidity and body mass index (BMI) of 45.0 to 49.9 in adult ([facility] 08/24/2018  . GAD (generalized anxiety disorder) 08/24/2018  . Acute pain of right knee 07/23/2018  . Chronic right-sided low back pain with right-sided sciatica 05/15/2018  . Moderate episode of recurrent major depressive disorder ([place] 09/05/2017  . Migraine without aura   . Arthritis   . Allergy   . Increased bowel frequency 07/12/2016  . Increased urinary frequency 07/12/2016  . Rhinitis, allergic 09/09/2014    JPercival Spanish PT, MPT 12/23/2019, 8:29 PM  [facility] [place] [address]Phone: [phone]  Fax:  [phone] Name: Elira M ICosta RicaMRN: [id number] of Birth: [birth year]

## 2019-12-23 NOTE — Patient Instructions (Signed)
    Home exercise program created by Amari Zagal, PT.  For questions, please contact Nikiyah Fackler via phone at [phone] or email at [email or web address]  [facility] [place] [address]  [address] [place], [place], [address] Phone: [phone]   Fax:  [phone]    

## 2020-01-01 ENCOUNTER — Other Ambulatory Visit: Payer: Self-pay

## 2020-01-01 ENCOUNTER — Ambulatory Visit (INDEPENDENT_AMBULATORY_CARE_PROVIDER_SITE_OTHER): Payer: Managed Care, Other (non HMO) | Admitting: Family Medicine

## 2020-01-01 ENCOUNTER — Encounter (INDEPENDENT_AMBULATORY_CARE_PROVIDER_SITE_OTHER): Payer: Self-pay | Admitting: Family Medicine

## 2020-01-01 ENCOUNTER — Ambulatory Visit: Payer: Managed Care, Other (non HMO)

## 2020-01-01 VITALS — BP 131/75 | HR 60 | Temp 98.2°F | Ht 66.0 in | Wt 280.0 lb

## 2020-01-01 DIAGNOSIS — M533 Sacrococcygeal disorders, not elsewhere classified: Secondary | ICD-10-CM

## 2020-01-01 DIAGNOSIS — E8881 Metabolic syndrome: Secondary | ICD-10-CM

## 2020-01-01 DIAGNOSIS — R29898 Other symptoms and signs involving the musculoskeletal system: Secondary | ICD-10-CM

## 2020-01-01 DIAGNOSIS — Z6841 Body Mass Index (BMI) 40.0 and over, adult: Secondary | ICD-10-CM

## 2020-01-01 DIAGNOSIS — R262 Difficulty in walking, not elsewhere classified: Secondary | ICD-10-CM

## 2020-01-01 DIAGNOSIS — M62838 Other muscle spasm: Secondary | ICD-10-CM

## 2020-01-01 NOTE — Therapy (Signed)
[facility] [address]  [address] [place], [place], [address] Phone: [phone]   Fax:  [phone]  Physical Therapy Treatment  Patient Details  Name: Carmen Wall [place] MRN: [id number] Date of Birth: Oct 19, 1990 Referring Provider (PT): Clare Gandy, MD   Encounter Date: 01/01/2020   PT End of Session - 01/01/20 1631    Visit Number 2    Number of Visits 8    Date for PT Re-Evaluation 02/17/20    Authorization Type Cigna    PT Start Time 1617    PT Stop Time 1703    PT Time Calculation (min) 46 min    Activity Tolerance Patient tolerated treatment well    Behavior During Therapy [facility] for tasks assessed/performed           Past Medical History:  Diagnosis Date  . ADHD   . Allergy   . Anxiety   . Anxiety   . Arthritis   . Back pain   . Bilateral swelling of feet   . Chronic fatigue syndrome   . Constipation   . Depression   . IBS (irritable bowel syndrome)   . Joint pain   . Lactose intolerance   . Migraine without aura     Past Surgical History:  Procedure Laterality Date  . INTRAUTERINE DEVICE (IUD) INSERTION  04/2017   Mirena   . KNEE ARTHROSCOPY Right     There were no vitals filed for this visit.   Subjective Assessment - 01/01/20 1627    Subjective Pt. has been performing HEP.  Feels that SI joint treatment helped last session.    Pertinent History R meniscal surgery 2008    Patient Stated Goals "find something to help relieve the tighness and pain"    Currently in Pain? Yes    Pain Score 0-No pain   pain up to 7/10 at times   Pain Location Buttocks    Pain Orientation Right    Pain Descriptors / Indicators Aching    Pain Type Acute pain    Pain Radiating Towards wrap around hip to groin    Pain Onset More than a month ago    Pain Frequency Intermittent                             [facility] Adult PT Treatment/Exercise - 01/01/20 0001      Self-Care   Self-Care  Other Self-Care Comments    Other Self-Care Comments  self glute ball release on wall       Lumbar Exercises: Stretches   Passive Hamstring Stretch Right;1 rep;30 seconds    Lower Trunk Rotation Limitations 5" x 10 reps    Standing Side Bend Right;30 seconds;2 reps    Standing Side Bend Limitations QL/ITB stretch at wall    Piriformis Stretch Right;30 seconds;1 rep    Piriformis Stretch Limitations KTOS    Other Lumbar Stretch Exercise Side lying R QL stretch 2 x 30 sec      Lumbar Exercises: Aerobic   Nustep Lvl 3, 6 min (LE) only       Lumbar Exercises: Supine   Clam 10 reps;3 seconds    Clam Limitations red TB at knees     Bridge with clamshell 10 reps;3 seconds    Bridge with [employer] Limitations + isometric hip abd/ER into red TB at knees       Lumbar Exercises: Sidelying   Clam  Right;Left;10 reps    Clam Limitations no resistance                   PT Education - 01/01/20 1802    Education Details HEP update;  bridge + hip abduction red TB, glute massage with tennis ball on wall    Person(s) Educated Patient    Methods Explanation;Demonstration;Verbal cues;Handout    Comprehension Verbalized understanding;Returned demonstration;Verbal cues required            PT Short Term Goals - 01/01/20 1631      PT SHORT TERM GOAL #1   Title Patient will be independent with initial HEP    Status On-going    Target Date 01/13/20      PT SHORT TERM GOAL #2   Title Patient to demonstrate improved tissue quality and pliability with reduced pain    Status On-going    Target Date 01/20/20             PT Long Term Goals - 01/01/20 1632      PT LONG TERM GOAL #1   Title Patient will be independent with ongoing/advanced HEP    Status On-going      PT LONG TERM GOAL #2   Title Patient to report pain reduction in frequency and intensity by >/= 50%    Status On-going      PT LONG TERM GOAL #3   Title Patient will negotiate stairs reciprocally with normal step  pattern w/o limitation due to R LE pain or weakness    Status On-going      PT LONG TERM GOAL #4   Title Patient to report ability to perform work and daily activities without pain provocation    Status On-going                 Plan - 01/01/20 1634    Clinical Impression Statement [name] with good overall demonstration of HEP and reports daily adherence.  Notes 30% improvement in R hip pain since last session and attributes this to manual SIJ correction.  SIJ alignment appeared normal today thus proceeded with proximal hip strengthening.  Patient tolerated all activities well in session.  HEP updated.    Comorbidities Chronic R LBP with R sciatica, OA, R knee pain (Jumper's knee), obesity, ADHD, anxiety and depression    Rehab Potential Good    PT Frequency 1x / week    PT Treatment/Interventions ADLs/Self Care Home Management;Cryotherapy;Electrical Stimulation;Iontophoresis 4mg /ml Dexamethasone;Moist Heat;Ultrasound;Gait training;Stair training;Functional mobility training;Therapeutic activities;Therapeutic exercise;Balance training;Neuromuscular re-education;Patient/family education;Manual techniques;Passive range of motion;Dry needling;Taping;Spinal Manipulations;Joint Manipulations    PT Next Visit Plan Reassess SIJ alignment; manual therapy including possible DN to address increased muscle tension; lumbopelvic flexibility and strengthening with HEP update; modalities PRN    PT Home Exercise Plan 7/19 - QL & glute/piriformis stretches; 07/28 - bridge + hip abduction red TB, glute massage with tennis ball on wall    Consulted and Agree with Plan of Care Patient           Patient will benefit from skilled therapeutic intervention in order to improve the following deficits and impairments:  Decreased activity tolerance, Decreased mobility, Decreased strength, Difficulty walking, Increased fascial restricitons, Increased muscle spasms, Impaired perceived functional ability, Impaired  flexibility, Impaired sensation, Improper body mechanics, Postural dysfunction, Pain  Visit Diagnosis: Other symptoms and signs involving the musculoskeletal system  Sacrococcygeal disorders, not elsewhere classified  Other muscle spasm  Difficulty in walking, not elsewhere classified     Problem  List Patient Active Problem List   Diagnosis Date Noted  . Other hyperlipidemia 12/12/2019  . 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 Vitamin D deficiency 11/25/2019  . Piriformis syndrome of right side 11/11/2019  . Cough variant asthma 09/26/2019  . Excessive daytime sleepiness 03/13/2019  . 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 Acute right ankle pain 10/24/2018  . Class 3 severe obesity with serious comorbidity and body mass index (BMI) of 45.0 to 49.9 in adult [facility]) 08/24/2018  . GAD (generalized anxiety disorder) 08/24/2018  . Acute pain of right knee 07/23/2018  . Chronic right-sided low back pain with right-sided sciatica 05/15/2018  . Moderate episode of recurrent major depressive disorder (HCC) 09/05/2017  . Migraine without aura   . Arthritis   . Allergy   . Increased bowel frequency 07/12/2016  . Increased urinary frequency 07/12/2016  . Rhinitis, allergic 09/09/2014    Kermit Balo, PTA 01/01/20 6:07 PM   [facility] [address]  [address] [place], [place], [address] Phone: [phone]   Fax:  [phone]  Name: Tehani Wall [place] MRN: [id number] Date of Birth: March 11, 1991

## 2020-01-01 NOTE — Progress Notes (Signed)
Chief Complaint:   OBESITY Carmen Wall is here to discuss her progress with her obesity treatment plan along with follow-up of her obesity related diagnoses. Shakala is on practicing portion control and making smarter food choices, such as increasing vegetables and decreasing simple carbohydrates and states she is following her eating plan approximately 80% of the time. Anagabriela states she is doing 0 minutes 0 times per week.  Today's visit was #: 9 Starting weight: 282 lbs Starting date: 07/03/2019 Today's weight: 280 lbs Today's date: 01/01/2020 Total lbs lost to date: 2 Total lbs lost since last in-office visit: 9  Interim History: Deari has started the [employer]. She is getting >80 grams of protein daily. She is not having excessive hunger. She has been on it for 2 weeks. She is down 9 lbs. The "Fuelings" are 11 grams of protein and she has 5 per day. She also has a meal of veggies and meat for supper.  Subjective:   1. Insulin resistance Dante denies polyphagia despite being on [employer].  Assessment/Plan:   1. Insulin resistance Alexandria will continue to work on weight loss, exercise, and decreasing simple carbohydrates to help decrease the risk of diabetes. She will continue to work on eating sufficient protein. Ryelee agreed to follow-up with [place] as directed to closely monitor her progress.  2. Class 3 severe obesity with serious comorbidity and body mass index (BMI) of 45.0 to 49.9 in adult, unspecified obesity type (HCC) Endora is currently in the action stage of change. As such, her goal is to continue with weight loss efforts. She has agreed to practicing portion control and making smarter food choices, such as increasing vegetables and decreasing simple carbohydrates with 80 grams of protein daily.   Exercise goals: No exercise has been prescribed at this time.  Behavioral modification strategies: increasing lean protein intake and decreasing  simple carbohydrates.  Goldie has agreed to follow-up with our clinic in 3 weeks. She was informed of the importance of frequent follow-up visits to maximize her success with intensive lifestyle modifications for her multiple health conditions.   Objective:   Blood pressure (!) 131/75, pulse 60, temperature 98.2 F (36.8 C), temperature source Oral, height 5\' 6"  (1.676 m), weight (!) 280 lb (127 kg), SpO2 98 %. Body mass index is 45.19 kg/m.  General: Cooperative, alert, well developed, in no acute distress. HEENT: Conjunctivae and lids unremarkable. Cardiovascular: Regular rhythm.  Lungs: Normal work of breathing. Neurologic: No focal deficits.   Lab Results  Component Value Date   CREATININE 0.79 07/03/2019   BUN 14 07/03/2019   NA 140 07/03/2019   K 4.3 07/03/2019   CL 103 07/03/2019   CO2 23 07/03/2019   Lab Results  Component Value Date   ALT 14 07/03/2019   AST 15 07/03/2019   ALKPHOS 64 07/03/2019   BILITOT 0.5 07/03/2019   Lab Results  Component Value Date   HGBA1C 4.9 07/03/2019   HGBA1C 4.9 09/05/2017   Lab Results  Component Value Date   INSULIN 14.9 11/21/2019   INSULIN 16.9 07/03/2019   Lab Results  Component Value Date   TSH 0.655 07/03/2019   Lab Results  Component Value Date   CHOL 205 (H) 11/21/2019   HDL 47 11/21/2019   LDLCALC 139 (H) 11/21/2019   TRIG 108 11/21/2019   CHOLHDL 4 08/24/2018   Lab Results  Component Value Date   WBC 5.0 07/03/2019   HGB 14.4 07/03/2019   HCT 41.4 07/03/2019  MCV 92 07/03/2019   PLT 189 07/03/2019   No results found for: IRON, TIBC, FERRITIN  Attestation Statements:   Reviewed by clinician on day of visit: allergies, medications, problem list, medical history, surgical history, family history, social history, and previous encounter notes.   Trude Mcburney, am acting as Energy manager for [place], FNP-C.  I have reviewed the above documentation for accuracy and completeness, and I  agree with the above. -  Jesse Sans, FNP

## 2020-01-10 ENCOUNTER — Ambulatory Visit: Payer: Managed Care, Other (non HMO) | Attending: Family Medicine

## 2020-01-10 ENCOUNTER — Other Ambulatory Visit: Payer: Self-pay

## 2020-01-10 DIAGNOSIS — R262 Difficulty in walking, not elsewhere classified: Secondary | ICD-10-CM

## 2020-01-10 DIAGNOSIS — M62838 Other muscle spasm: Secondary | ICD-10-CM

## 2020-01-10 DIAGNOSIS — M533 Sacrococcygeal disorders, not elsewhere classified: Secondary | ICD-10-CM | POA: Diagnosis present

## 2020-01-10 DIAGNOSIS — R29898 Other symptoms and signs involving the musculoskeletal system: Secondary | ICD-10-CM | POA: Diagnosis not present

## 2020-01-10 NOTE — Therapy (Signed)
[facility] [address]  [address] [place], [place], [address] Phone: [phone]   Fax:  [phone]  Physical Therapy Treatment  Patient Details  Name: Carmen Wall MRN: [id number] Date of Birth: 01/26/1991 Referring Provider (PT): Clare Gandy, MD   Encounter Date: 01/10/2020   PT End of Session - 01/10/20 1020    Visit Number 3    Number of Visits 8    Date for PT Re-Evaluation 02/17/20    Authorization Type [employer]    PT Start Time 1015    PT Stop Time 1108    PT Time Calculation (min) 53 min    Activity Tolerance Patient tolerated treatment well    Behavior During Therapy [facility] for tasks assessed/performed           Past Medical History:  Diagnosis Date  . ADHD   . Allergy   . Anxiety   . Anxiety   . Arthritis   . Back pain   . Bilateral swelling of feet   . Chronic fatigue syndrome   . Constipation   . Depression   . IBS (irritable bowel syndrome)   . Joint pain   . Lactose intolerance   . Migraine without aura     Past Surgical History:  Procedure Laterality Date  . INTRAUTERINE DEVICE (IUD) INSERTION  04/2017   Mirena   . KNEE ARTHROSCOPY Right     There were no vitals filed for this visit.   Subjective Assessment - 01/10/20 1018    Subjective Pt. noting "squeezing pain" in R lateral thigh after long work shifts.    Pertinent History R meniscal surgery 2008    Patient Stated Goals "find something to help relieve the tighness and pain"    Currently in Pain? Yes    Pain Score 0-No pain   pain risgin to 7/10   Pain Location Buttocks    Pain Orientation Right    Pain Descriptors / Indicators Squeezing    Pain Type Acute pain    Pain Radiating Towards into R lateral thigh    Pain Onset More than a month ago    Pain Frequency Intermittent    Multiple Pain Sites No                             [facility] Adult PT Treatment/Exercise - 01/10/20 0001      Self-Care    Self-Care Other Self-Care Comments    Other Self-Care Comments  self glute ball release on wall       Lumbar Exercises: Stretches   Lower Trunk Rotation Limitations 5" x 10 reps    Piriformis Stretch Right;30 seconds;1 rep    Piriformis Stretch Limitations KTOS      Lumbar Exercises: Aerobic   Nustep Lvl 3, 6 min (LE) only       Lumbar Exercises: Supine   Clam 3 seconds;15 reps    Clam Limitations red TB at knees     Bridge with clamshell 10 reps;3 seconds    Bridge with [employer] Limitations + isometric hip abd/ER into red TB at knees       Lumbar Exercises: Sidelying   Clam Right;Left;10 reps      Knee/Hip Exercises: Standing   Hip Abduction Right;Left;10 reps;Knee straight;Stengthening    Abduction Limitations at counter     Functional Squat 10 reps;3 seconds    Functional Squat Limitations counter + red TB  at knees       Modalities   Modalities Moist Heat;Electrical Stimulation      Moist Heat Therapy   Number Minutes Moist Heat 15 Minutes    Moist Heat Location Hip   R buttocks      Electrical Stimulation   Electrical Stimulation Location R buttocks     Electrical Stimulation Action IFC    Electrical Stimulation Parameters 80-150Hz , intensity to pt. tolerance, 15'    Electrical Stimulation Goals Tone      Manual Therapy   Manual Therapy Soft tissue mobilization;Myofascial release    Manual therapy comments sidelying with R LE elevated on bolster     Soft tissue mobilization STM/DTM to R piriformis, R glute med    Myofascial Release TPR to R distal piriformis                   PT Education - 01/10/20 1203    Education Details HEP update; standing hip abduction, counter squat, sidelying clam shell    Person(s) Educated Patient    Methods Explanation;Demonstration;Verbal cues;Handout    Comprehension Verbalized understanding;Returned demonstration;Verbal cues required            PT Short Term Goals - 01/01/20 1631      PT SHORT TERM GOAL #1    Title Patient will be independent with initial HEP    Status On-going    Target Date 01/13/20      PT SHORT TERM GOAL #2   Title Patient to demonstrate improved tissue quality and pliability with reduced pain    Status On-going    Target Date 01/20/20             PT Long Term Goals - 01/01/20 1632      PT LONG TERM GOAL #1   Title Patient will be independent with ongoing/advanced HEP    Status On-going      PT LONG TERM GOAL #2   Title Patient to report pain reduction in frequency and intensity by >/= 50%    Status On-going      PT LONG TERM GOAL #3   Title Patient will negotiate stairs reciprocally with normal step pattern w/o limitation due to R LE pain or weakness    Status On-going      PT LONG TERM GOAL #4   Title Patient to report ability to perform work and daily activities without pain provocation    Status On-going                 Plan - 01/10/20 1021    Clinical Impression Statement Pt. reporting some muscular soreness after last visit which subsided.  Notes R hip was feeling ok for first few workdays than by 5th day felt "squeezing" pain in R buttocks and R lateral thigh with work tasks.  Has been performing HEP daily.  Progressed proximal hip strengthening, LE stretching, and initiated STM/DMT and TPR to R piriformis, glute Medius for improved tissue quality with good response.  Ended visit with trial of E-stim/moist heat to R glute for reduction in pain and tone.  Pt. leaving session pain free.    Comorbidities Chronic R LBP with R sciatica, OA, R knee pain (Jumper's knee), obesity, ADHD, anxiety and depression    Rehab Potential Good    PT Frequency 1x / week    PT Duration 8 weeks    PT Treatment/Interventions ADLs/Self Care Home Management;Cryotherapy;Electrical Stimulation;Iontophoresis 4mg /ml Dexamethasone;Moist Heat;Ultrasound;Gait training;Stair training;Functional mobility training;Therapeutic activities;Therapeutic exercise;Balance  training;Neuromuscular  re-education;Patient/family education;Manual techniques;Passive range of motion;Dry needling;Taping;Spinal Manipulations;Joint Manipulations    PT Next Visit Plan Reassess SIJ alignment prn; manual therapy including possible DN to address increased muscle tension; lumbopelvic flexibility and strengthening with HEP update; modalities PRN    PT Home Exercise Plan 7/19 - QL & glute/piriformis stretches; 07/28 - bridge + hip abduction red TB, glute massage with tennis ball on wall; 08/06 - standing hip abduction, counter squat, sidelying clam shell    Consulted and Agree with Plan of Care Patient           Patient will benefit from skilled therapeutic intervention in order to improve the following deficits and impairments:  Decreased activity tolerance, Decreased mobility, Decreased strength, Difficulty walking, Increased fascial restricitons, Increased muscle spasms, Impaired perceived functional ability, Impaired flexibility, Impaired sensation, Improper body mechanics, Postural dysfunction, Pain  Visit Diagnosis: Other symptoms and signs involving the musculoskeletal system  Sacrococcygeal disorders, not elsewhere classified  Other muscle spasm  Difficulty in walking, not elsewhere classified     Problem List Patient Active Problem List   Diagnosis Date Noted  . Other hyperlipidemia 12/12/2019  . 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 Vitamin D deficiency 11/25/2019  . Piriformis syndrome of right side 11/11/2019  . Cough variant asthma 09/26/2019  . Excessive daytime sleepiness 03/13/2019  . 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 Acute right ankle pain 10/24/2018  . Class 3 severe obesity with serious comorbidity and body mass index (BMI) of 45.0 to 49.9 in adult [facility]) 08/24/2018  . GAD (generalized anxiety disorder) 08/24/2018  . Acute pain of right knee 07/23/2018  . Chronic right-sided low back pain with right-sided sciatica 05/15/2018  . Moderate episode  of recurrent major depressive disorder (HCC) 09/05/2017  . Migraine without aura   . Arthritis   . Allergy   . Increased bowel frequency 07/12/2016  . Increased urinary frequency 07/12/2016  . Rhinitis, allergic 09/09/2014    Kermit Balo, PTA 01/10/20 12:09 PM   [facility] [address]  [address] [place], [place], [address] Phone: [phone]   Fax:  [phone]  Name: Carmen Wall MRN: [id number] Date of Birth: 1991/05/30

## 2020-01-21 ENCOUNTER — Ambulatory Visit (INDEPENDENT_AMBULATORY_CARE_PROVIDER_SITE_OTHER): Payer: Managed Care, Other (non HMO) | Admitting: Family Medicine

## 2020-01-21 ENCOUNTER — Other Ambulatory Visit: Payer: Self-pay

## 2020-01-21 ENCOUNTER — Encounter: Payer: Self-pay | Admitting: Physical Therapy

## 2020-01-21 ENCOUNTER — Encounter (INDEPENDENT_AMBULATORY_CARE_PROVIDER_SITE_OTHER): Payer: Self-pay | Admitting: Family Medicine

## 2020-01-21 ENCOUNTER — Ambulatory Visit: Payer: Managed Care, Other (non HMO) | Admitting: Physical Therapy

## 2020-01-21 VITALS — BP 134/71 | HR 68 | Temp 98.6°F | Ht 66.0 in | Wt 277.0 lb

## 2020-01-21 DIAGNOSIS — Z6841 Body Mass Index (BMI) 40.0 and over, adult: Secondary | ICD-10-CM

## 2020-01-21 DIAGNOSIS — R262 Difficulty in walking, not elsewhere classified: Secondary | ICD-10-CM

## 2020-01-21 DIAGNOSIS — Z9189 Other specified personal risk factors, not elsewhere classified: Secondary | ICD-10-CM

## 2020-01-21 DIAGNOSIS — E559 Vitamin D deficiency, unspecified: Secondary | ICD-10-CM | POA: Diagnosis not present

## 2020-01-21 DIAGNOSIS — M62838 Other muscle spasm: Secondary | ICD-10-CM

## 2020-01-21 DIAGNOSIS — R29898 Other symptoms and signs involving the musculoskeletal system: Secondary | ICD-10-CM

## 2020-01-21 DIAGNOSIS — K5909 Other constipation: Secondary | ICD-10-CM

## 2020-01-21 DIAGNOSIS — M533 Sacrococcygeal disorders, not elsewhere classified: Secondary | ICD-10-CM

## 2020-01-21 MED ORDER — VITAMIN D (ERGOCALCIFEROL) 1.25 MG (50000 UNIT) PO CAPS: 50000.0000 [IU] | ORAL_CAPSULE | ORAL | 0 refills | Status: DC

## 2020-01-21 NOTE — Progress Notes (Signed)
Chief Complaint:   OBESITY Carmen Wall is here to discuss her progress with her obesity treatment plan along with follow-up of her obesity related diagnoses. Carmen Wall is on practicing portion control and making smarter food choices, such as increasing vegetables and decreasing simple carbohydrates and states she is following her eating plan approximately 75% of the time. Carmen Wall states she is doing 0 minutes 0 times per week.  Today's visit was #: 10 Starting weight: 282 lbs Starting date: 07/03/2019 Today's weight: 277 lbs Today's date: 01/21/2020 Total lbs lost to date: 5 Total lbs lost since last in-office visit: 3  Interim History: Carmen Wall is on the [place] plan (5 Fuelings and one meal of protein and vegetables) but she does deviate from it at times. She sometimes is unable to eat one of her "Fuelings" due to lack of a break at work which causes her to overeat later. She notes her energy is good. She only has excessive hunger when she misses a "Fueling".  Subjective:   1. Other constipation Carmen Wall notes worsening constipation recently. This is a chronic issue. She has been using stool softeners. Vegetable intake is very good. She has had very hard stools that cause rectal tears in the past.  2. Vitamin D deficiency Carmen Wall's last Vit D level was low at 24.1. She is on prescription Vit D.  3. At risk for dehydration Carmen Wall is at risk for dehydration due to being on [employer] plan and decreased fluid intake.  Assessment/Plan:   1. Other constipation Carmen Wall was informed that a decrease in bowel movement frequency is normal while losing weight, but stools should not be hard or painful. Carmen Wall is to start miralax daily and increase her fluids.   2. Vitamin D deficiency Low Vitamin D level contributes to fatigue and are associated with obesity, breast, and colon cancer. We will refill prescription Vitamin D for 1 month. Carmen Wall will follow-up for routine  testing of Vitamin D, at least 2-3 times per year to avoid over-replacement.  - Vitamin D, Ergocalciferol, (DRISDOL) 1.25 MG (50000 UNIT) CAPS capsule; Take 1 capsule (50,000 Units total) by mouth every 7 (seven) days.  Dispense: 4 capsule; Refill: 0  3. At risk for dehydration Carmen Wall was given approximately 15 minutes dehydration prevention counseling today. Carmen Wall is at risk for dehydration due to weight loss and current medication(s). She was encouraged to hydrate and monitor fluid status to avoid dehydration as well as weight loss plateaus.   4. Class 3 severe obesity with serious comorbidity and body mass index (BMI) of 40.0 to 44.9 in adult, unspecified obesity type (HCC) Carmen Wall is currently in the action stage of change. As such, her goal is to continue with weight loss efforts. She has agreed to practicing portion control and making smarter food choices, such as increasing vegetables and decreasing simple carbohydrates/using [employer] plan.   Exercise goals: No exercise has been prescribed at this time.  Behavioral modification strategies: increasing lean protein intake, increasing water intake, meal planning and cooking strategies, keeping healthy foods in the home and better snacking choices.  Carmen Wall has agreed to follow-up with our clinic in 4 weeks. She was informed of the importance of frequent follow-up visits to maximize her success with intensive lifestyle modifications for her multiple health conditions.   Objective:   Blood pressure 134/71, pulse 68, temperature 98.6 F (37 C), temperature source Oral, height 5\' 6"  (1.676 m), weight 277 lb (125.6 kg), SpO2 96 %. Body mass index is 44.71 kg/m.  General:  Cooperative, alert, well developed, in no acute distress. HEENT: Conjunctivae and lids unremarkable. Cardiovascular: Regular rhythm.  Lungs: Normal work of breathing. Neurologic: No focal deficits.   Lab Results  Component Value Date   CREATININE 0.79  07/03/2019   BUN 14 07/03/2019   NA 140 07/03/2019   K 4.3 07/03/2019   CL 103 07/03/2019   CO2 23 07/03/2019   Lab Results  Component Value Date   ALT 14 07/03/2019   AST 15 07/03/2019   ALKPHOS 64 07/03/2019   BILITOT 0.5 07/03/2019   Lab Results  Component Value Date   HGBA1C 4.9 07/03/2019   HGBA1C 4.9 09/05/2017   Lab Results  Component Value Date   INSULIN 14.9 11/21/2019   INSULIN 16.9 07/03/2019   Lab Results  Component Value Date   TSH 0.655 07/03/2019   Lab Results  Component Value Date   CHOL 205 (H) 11/21/2019   HDL 47 11/21/2019   LDLCALC 139 (H) 11/21/2019   TRIG 108 11/21/2019   CHOLHDL 4 08/24/2018   Lab Results  Component Value Date   WBC 5.0 07/03/2019   HGB 14.4 07/03/2019   HCT 41.4 07/03/2019   MCV 92 07/03/2019   PLT 189 07/03/2019   No results found for: IRON, TIBC, FERRITIN  Attestation Statements:   Reviewed by clinician on day of visit: allergies, medications, problem list, medical history, surgical history, family history, social history, and previous encounter notes.   Trude Mcburney, am acting as Energy manager for [place], FNP-C.  I have reviewed the above documentation for accuracy and completeness, and I agree with the above. -  Jesse Sans, FNP

## 2020-01-21 NOTE — Therapy (Signed)
[facility] [address]  [address] [place], [place], [address] Phone: [phone]   Fax:  [phone]  Physical Therapy Treatment  Patient Details  Name: Carmen Wall MRN: [id number] Date of Birth: 1991-04-22 Referring Provider (PT): Clare Gandy, MD   Encounter Date: 01/21/2020   PT End of Session - 01/21/20 1402    Visit Number 4    Number of Visits 8    Date for PT Re-Evaluation 02/17/20    Authorization Type Cigna    PT Start Time 1402    PT Stop Time 1504    PT Time Calculation (min) 62 min    Activity Tolerance Patient tolerated treatment well    Behavior During Therapy [facility] for tasks assessed/performed           Past Medical History:  Diagnosis Date  . ADHD   . Allergy   . Anxiety   . Anxiety   . Arthritis   . Back pain   . Bilateral swelling of feet   . Chronic fatigue syndrome   . Constipation   . Depression   . IBS (irritable bowel syndrome)   . Joint pain   . Lactose intolerance   . Migraine without aura     Past Surgical History:  Procedure Laterality Date  . INTRAUTERINE DEVICE (IUD) INSERTION  04/2017   Mirena   . KNEE ARTHROSCOPY Right     There were no vitals filed for this visit.   Subjective Assessment - 01/21/20 1405    Subjective Pt noting more pain recently in upper glutes on R but less pain in piriformis. Also noted increased soreness for 2 days following last therapy session.    Pertinent History R meniscal surgery 2008    Patient Stated Goals "find something to help relieve the tighness and pain"    Currently in Pain? No/denies    Pain Score 0-No pain   still experiences pain up to 10/10 at times when attempting to stand up   Pain Location Buttocks    Pain Descriptors / Indicators Squeezing;Cramping    Pain Type Acute pain    Pain Frequency Intermittent    Aggravating Factors  sit to stand (esp from low/soft surface), rolling over to belly in bed                              [facility] Adult PT Treatment/Exercise - 01/21/20 1402      Lumbar Exercises: Stretches   Piriformis Stretch Right;30 seconds;1 rep    Piriformis Stretch Limitations KTOS    Figure 4 Stretch 30 seconds;1 rep;Supine;With overpressure    Other Lumbar Stretch Exercise Side lying R QL stretch 2 x 30 sec      Lumbar Exercises: Aerobic   Recumbent Bike L1 x 6 min      Modalities   Modalities Electrical Stimulation;Moist Heat      Moist Heat Therapy   Number Minutes Moist Heat 15 Minutes    Moist Heat Location Hip   R buttocks     Electrical Stimulation   Electrical Stimulation Location R buttocks     Electrical Stimulation Action IFC    Electrical Stimulation Parameters 80-150 Hz, intensity to pt tol x 15'    Electrical Stimulation Goals Tone;Pain      Manual Therapy   Manual Therapy Soft tissue mobilization;Myofascial release    Manual therapy comments skilled palpation and monitoring during DN  Soft tissue mobilization STM/DTM to R glutes & piriformis    Myofascial Release manual TPR to mid proximal glute medius & minimus, lateral piriformis            Trigger Point Dry Needling - 01/21/20 1402    Consent Given? Yes    Education Handout Provided Yes    Muscles Treated Back/Hip Gluteus minimus;Gluteus medius;Piriformis   Rt   Gluteus Minimus Response Twitch response elicited;Palpable increased muscle length    Gluteus Medius Response Twitch response elicited;Palpable increased muscle length    Piriformis Response Twitch response elicited;Palpable increased muscle length                PT Education - 01/21/20 1410    Education Details Role of DN, expected response to treatment and recommended post-treatment activity    Person(s) Educated Patient    Methods Explanation;Handout    Comprehension Verbalized understanding            PT Short Term Goals - 01/21/20 1409      PT SHORT TERM GOAL #1   Title Patient will be  independent with initial HEP    Status Achieved   01/21/20     PT SHORT TERM GOAL #2   Title Patient to demonstrate improved tissue quality and pliability with reduced pain    Status On-going             PT Long Term Goals - 01/01/20 1632      PT LONG TERM GOAL #1   Title Patient will be independent with ongoing/advanced HEP    Status On-going      PT LONG TERM GOAL #2   Title Patient to report pain reduction in frequency and intensity by >/= 50%    Status On-going      PT LONG TERM GOAL #3   Title Patient will negotiate stairs reciprocally with normal step pattern w/o limitation due to R LE pain or weakness    Status On-going      PT LONG TERM GOAL #4   Title Patient to report ability to perform work and daily activities without pain provocation    Status On-going                 Plan - 01/21/20 1500    Clinical Impression Statement [name] continues to report increased muscle soreness following PT visit, taking 2 days to resolve after last session. Pain seems better along piriformis but pt now noting increased intermittent pain in superior glutes which can get as high as 10/10 upon attempting to rise from sitting or when rolling over to belly while sleeping. Significant increased muscle tension, TPs and ttp noted in mid superior glutes and to a lesser extent in lateral piriformis - addressed with MT incorporating DN upon informed pt consent. Pt noting strong cramping sensation but able to elicit good twitch response with palpable reduction in muscle tension following DN. Reviewed relevant stretches to further reduce muscle tightness/tension and concluded visit with estim and moist to promote further muscle relaxation.    Comorbidities Chronic R LBP with R sciatica, OA, R knee pain (Jumper's knee), obesity, ADHD, anxiety and depression    Rehab Potential Good    PT Frequency 1x / week    PT Duration 8 weeks    PT Treatment/Interventions ADLs/Self Care Home  Management;Cryotherapy;Electrical Stimulation;Iontophoresis 4mg /ml Dexamethasone;Moist Heat;Ultrasound;Gait training;Stair training;Functional mobility training;Therapeutic activities;Therapeutic exercise;Balance training;Neuromuscular re-education;Patient/family education;Manual techniques;Passive range of motion;Dry needling;Taping;Spinal Manipulations;Joint Manipulations    PT Next Visit Plan  assess response to DN; reassess SIJ alignment prn; manual therapy including DN as benefit noted to address increased muscle tension; lumbopelvic flexibility and strengthening with HEP update; modalities PRN    PT Home Exercise Plan 7/19 - QL & glute/piriformis stretches; 07/28 - bridge + hip abduction red TB, glute massage with tennis ball on wall; 08/06 - standing hip abduction, counter squat, sidelying clam shell    Consulted and Agree with Plan of Care Patient           Patient will benefit from skilled therapeutic intervention in order to improve the following deficits and impairments:  Decreased activity tolerance, Decreased mobility, Decreased strength, Difficulty walking, Increased fascial restricitons, Increased muscle spasms, Impaired perceived functional ability, Impaired flexibility, Impaired sensation, Improper body mechanics, Postural dysfunction, Pain  Visit Diagnosis: Other symptoms and signs involving the musculoskeletal system  Sacrococcygeal disorders, not elsewhere classified  Other muscle spasm  Difficulty in walking, not elsewhere classified     Problem List Patient Active Problem List   Diagnosis Date Noted  . Other hyperlipidemia 12/12/2019  . 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 Vitamin D deficiency 11/25/2019  . Piriformis syndrome of right side 11/11/2019  . Cough variant asthma 09/26/2019  . Excessive daytime sleepiness 03/13/2019  . 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 Acute right ankle pain 10/24/2018  . Class 3 severe obesity with serious comorbidity and body  mass index (BMI) of 40.0 to 44.9 in adult [facility]) 08/24/2018  . GAD (generalized anxiety disorder) 08/24/2018  . Acute pain of right knee 07/23/2018  . Chronic right-sided low back pain with right-sided sciatica 05/15/2018  . Moderate episode of recurrent major depressive disorder (HCC) 09/05/2017  . Migraine without aura   . Arthritis   . Allergy   . Increased bowel frequency 07/12/2016  . Increased urinary frequency 07/12/2016  . Rhinitis, allergic 09/09/2014    Marry Guan, PT, MPT 01/21/2020, 3:09 PM  [facility] [address]  [address] [place], [place], [address] Phone: [phone]   Fax:  [phone]  Name: Carmen Wall MRN: [id number] Date of Birth: 06-16-1990

## 2020-01-21 NOTE — Patient Instructions (Signed)

## 2020-02-03 ENCOUNTER — Ambulatory Visit: Payer: Managed Care, Other (non HMO) | Admitting: Physical Therapy

## 2020-02-03 ENCOUNTER — Other Ambulatory Visit: Payer: Self-pay

## 2020-02-03 ENCOUNTER — Encounter: Payer: Self-pay | Admitting: Physical Therapy

## 2020-02-03 DIAGNOSIS — R29898 Other symptoms and signs involving the musculoskeletal system: Secondary | ICD-10-CM

## 2020-02-03 DIAGNOSIS — M533 Sacrococcygeal disorders, not elsewhere classified: Secondary | ICD-10-CM

## 2020-02-03 DIAGNOSIS — M62838 Other muscle spasm: Secondary | ICD-10-CM

## 2020-02-03 DIAGNOSIS — R262 Difficulty in walking, not elsewhere classified: Secondary | ICD-10-CM

## 2020-02-03 NOTE — Therapy (Signed)
[facility] [address]  [address] [place], [place], [address] Phone: [phone]   Fax:  [phone]  Physical Therapy Treatment  Patient Details  Name: Carmen Wall MRN: [id number] Date of Birth: 1990/09/03 Referring Provider (PT): Clare Gandy, MD   Encounter Date: 02/03/2020   PT End of Session - 02/03/20 0814    Visit Number 5    Number of Visits 8    Date for PT Re-Evaluation 02/17/20    Authorization Type Cign[employer]    PT Start Time [id number]   Pt arrived late   PT Stop Time 0848    PT Time Calculation (min) 34 min    Activity Tolerance Patient tolerated treatment well    Behavior During Therapy [facility] for tasks assessed/performed           Past Medical History:  Diagnosis Date  . ADHD   . Allergy   . Anxiety   . Anxiety   . Arthritis   . Back pain   . Bilateral swelling of feet   . Chronic fatigue syndrome   . Constipation   . Depression   . IBS (irritable bowel syndrome)   . Joint pain   . Lactose intolerance   . Migraine without aura     Past Surgical History:  Procedure Laterality Date  . INTRAUTERINE DEVICE (IUD) INSERTION  04/2017   Mirena   . KNEE ARTHROSCOPY Right     There were no vitals filed for this visit.   Subjective Assessment - 02/03/20 0816    Subjective Pt now noting "squeezing" pain "up top" on L but no pain into L LE - pain "going back and forth" L to R. Notes benefit from DN for ~3 days. Typically has to use TENS machine each night.    Pertinent History R meniscal surgery 2008    Patient Stated Goals "find something to help relieve the tighness and pain"    Currently in Pain? Yes    Pain Score 4     Pain Location Buttocks    Pain Orientation Right;Left    Pain Descriptors / Indicators Aching;Squeezing;Sharp;Tender    Pain Type Acute pain    Pain Radiating Towards intermittent pain down to R ankle    Pain Frequency Intermittent    Aggravating Factors  stairs,  turning/pivoting wrong or too fast, sleeping wrong way              [facility] PT Assessment - 02/03/20 0814      Palpation   SI assessment  alignment appears neutral today                         [facility] Adult PT Treatment/Exercise - 02/03/20 0814      Lumbar Exercises: Stretches   Standing Side Bend Right;30 seconds;2 reps    Standing Side Bend Limitations QL/ITB stretch at wall      Lumbar Exercises: Aerobic   Recumbent Bike L2 x 6 min      Lumbar Exercises: Sidelying   Hip Abduction Right;10 reps;3 seconds    Hip Abduction Weights (lbs) cues to slow pace and lift into slight hipe extension, avoiding hip flexion      Lumbar Exercises: Quadruped   Other Quadruped Lumbar Exercises R/L bent knee hip extension 10 x 3"   cues for neutral spine   Other Quadruped Lumbar Exercises R/L fire hydrants 10 x 3"   cues for neutral spine avoiding trunk  rotation     Knee/Hip Exercises: Standing   Hip Abduction Right;Left;10 reps;Knee straight;Stengthening    Abduction Limitations cues for slight hip extension                  PT Education - 02/03/20 0848    Education Details HEP update - standing ITB/QL stretch, glute med hip kicker, quadruped hip extension & fire hydrants    Person(s) Educated Patient    Methods Explanation;Demonstration;Verbal cues;Handout    Comprehension Verbalized understanding;Verbal cues required;Returned demonstration;Need further instruction            PT Short Term Goals - 01/21/20 1409      PT SHORT TERM GOAL #1   Title Patient will be independent with initial HEP    Status Achieved   01/21/20     PT SHORT TERM GOAL #2   Title Patient to demonstrate improved tissue quality and pliability with reduced pain    Status On-going             PT Long Term Goals - 01/01/20 1632      PT LONG TERM GOAL #1   Title Patient will be independent with ongoing/advanced HEP    Status On-going      PT LONG TERM GOAL #2   Title Patient to  report pain reduction in frequency and intensity by >/= 50%    Status On-going      PT LONG TERM GOAL #3   Title Patient will negotiate stairs reciprocally with normal step pattern w/o limitation due to R LE pain or weakness    Status On-going      PT LONG TERM GOAL #4   Title Patient to report ability to perform work and daily activities without pain provocation    Status On-going                 Plan - 02/03/20 0848    Clinical Impression Statement Carmen Wall reports pain seems to be moving laterally from R <> L, but no radicular pain on L. Pt continues to note pain down LE to ankle at time (consistent with glute minimus TP referral pattern). SIJ remains in neutral alignment. Review of HEP exercises reveals excessive activation of anterior hip muscles while minimal to no recruitment of glutes with both side lying and standing hip abduction and pt reporting cramping sensation in TFL and anterior hip - corrected technique with pt reporting better awareness of glute activation but noting sensation of potential cramping in glutes (no actual cramp experienced). Also reviewed standing QL/ITB stretch as pt feels that this provides good stretch in areas prone to cramping feeling.  HEP updated to include standing QL/ITB as well as targeted glute strengthening.    Comorbidities Chronic R LBP with R sciatica, OA, R knee pain (Jumper's knee), obesity, ADHD, anxiety and depression    Rehab Potential Good    PT Frequency 1x / week    PT Duration 8 weeks    PT Treatment/Interventions ADLs/Self Care Home Management;Cryotherapy;Electrical Stimulation;Iontophoresis 4mg /ml Dexamethasone;Moist Heat;Ultrasound;Gait training;Stair training;Functional mobility training;Therapeutic activities;Therapeutic exercise;Balance training;Neuromuscular re-education;Patient/family education;Manual techniques;Passive range of motion;Dry needling;Taping;Spinal Manipulations;Joint Manipulations    PT Next Visit Plan reassess SIJ  alignment prn; manual therapy including DN as benefit noted to address increased muscle tension; lumbopelvic flexibility and strengthening with HEP update PRN; modalities PRN    PT Home Exercise Plan 7/19 - QL & glute/piriformis stretches; 07/28 - bridge + hip abduction red TB, glute massage with tennis ball on wall; 08/06 -  standing hip abduction, counter squat, sidelying clam shell    Consulted and Agree with Plan of Care Patient           Patient will benefit from skilled therapeutic intervention in order to improve the following deficits and impairments:  Decreased activity tolerance, Decreased mobility, Decreased strength, Difficulty walking, Increased fascial restricitons, Increased muscle spasms, Impaired perceived functional ability, Impaired flexibility, Impaired sensation, Improper body mechanics, Postural dysfunction, Pain  Visit Diagnosis: Other symptoms and signs involving the musculoskeletal system  Sacrococcygeal disorders, not elsewhere classified  Other muscle spasm  Difficulty in walking, not elsewhere classified     Problem List Patient Active Problem List   Diagnosis Date Noted  . Other hyperlipidemia 12/12/2019  . 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 Vitamin D deficiency 11/25/2019  . Piriformis syndrome of right side 11/11/2019  . Cough variant asthma 09/26/2019  . Excessive daytime sleepiness 03/13/2019  . 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 Acute right ankle pain 10/24/2018  . Class 3 severe obesity with serious comorbidity and body mass index (BMI) of 40.0 to 44.9 in adult [facility]) 08/24/2018  . GAD (generalized anxiety disorder) 08/24/2018  . Acute pain of right knee 07/23/2018  . Chronic right-sided low back pain with right-sided sciatica 05/15/2018  . Moderate episode of recurrent major depressive disorder (HCC) 09/05/2017  . Migraine without aura   . Arthritis   . Allergy   . Increased bowel frequency 07/12/2016  . Increased urinary frequency  07/12/2016  . Rhinitis, allergic 09/09/2014    Marry Guan, PT, MPT 02/03/2020, 10:17 AM  [facility] [address]  [address] [place], [place], [address] Phone: [phone]   Fax:  [phone]  Name: Carmen Wall MRN: [id number] Date of Birth: 1991/02/13

## 2020-02-06 ENCOUNTER — Other Ambulatory Visit: Payer: Self-pay

## 2020-02-07 ENCOUNTER — Encounter: Payer: Self-pay | Admitting: Nurse Practitioner

## 2020-02-07 ENCOUNTER — Ambulatory Visit: Payer: Managed Care, Other (non HMO) | Admitting: Nurse Practitioner

## 2020-02-07 VITALS — BP 124/80 | HR 80 | Temp 97.7°F | Ht 67.0 in | Wt 282.2 lb

## 2020-02-07 DIAGNOSIS — R238 Other skin changes: Secondary | ICD-10-CM

## 2020-02-07 DIAGNOSIS — R233 Spontaneous ecchymoses: Secondary | ICD-10-CM

## 2020-02-07 NOTE — Progress Notes (Signed)
Subjective:  Patient ID: Kayden M [place], female    DOB: January 18, 1991  Age: 29 y.o. MRN: [id number]  CC: Acute Visit (Pt c/o bruises on both legs x2-3 weeks. Denies any known injury. Pt states she gets burning and itching sensations and she will see little blood spots and the next day her legs will be bruised. )  Rash This is a new problem. The current episode started in the past 7 days. The problem is unchanged. The affected locations include the right buttock, right upper leg, left buttock, left upper leg, right arm and left arm. The rash is characterized by bruising and itchiness. She was exposed to nothing. Pertinent negatives include no anorexia, fatigue, fever or joint pain. Past treatments include nothing.  denies any injury Denies any OTC herbal supplement or daily NSAIDs use. No ABD pain, no melena or hematochezia, no nosebleeds, no swollen lymph notes  Reviewed past Medical, Social and Family history today.  Outpatient Medications Prior to Visit  Medication Sig Dispense Refill  . Acetaminophen-Caffeine (EXCEDRIN TENSION HEADACHE PO) Take by mouth.    Marland Kitchen albuterol (VENTOLIN HFA) 108 (90 Base) MCG/ACT inhaler Inhale 1 puff into the lungs every 6 (six) hours as needed for wheezing or shortness of breath. 6.7 g 1  . buPROPion (WELLBUTRIN XL) 150 MG 24 hr tablet Take 1 tablet (150 mg total) by mouth daily. 90 tablet 3  . busPIRone (BUSPAR) 15 MG tablet Take 1 tablet (15 mg total) by mouth 2 (two) times daily. 180 tablet 3  . escitalopram (LEXAPRO) 20 MG tablet Take 1 tablet (20 mg total) by mouth at bedtime. 90 tablet 3  . levonorgestrel (MIRENA) 20 MCG/24HR IUD 1 each by Intrauterine route once.    Marland Kitchen tiZANidine (ZANAFLEX) 4 MG capsule Take 1 capsule (4 mg total) by mouth 3 (three) times daily as needed for muscle spasms. 14 capsule 0  . traZODone (DESYREL) 50 MG tablet TAKE 1 TABLET BY MOUTH EVERY NIGHT AT BEDTIME 90 tablet 1  . Vitamin D, Ergocalciferol, (DRISDOL) 1.25 MG (50000  UNIT) CAPS capsule Take 1 capsule (50,000 Units total) by mouth every 7 (seven) days. 4 capsule 0   No facility-administered medications prior to visit.    ROS See HPI  Objective:  BP 124/80 (BP Location: Left Arm, Patient Position: Sitting, Cuff Size: Normal)   Pulse 80   Temp 97.7 F (36.5 C) (Temporal)   Ht 5\' 7"  (1.702 m)   Wt 282 lb 3.2 oz (128 kg)   SpO2 98%   BMI 44.20 kg/m   Physical Exam Vitals reviewed.  Constitutional:      Appearance: She is obese.  Cardiovascular:     Rate and Rhythm: Normal rate.     Pulses: Normal pulses.  Skin:    General: Skin is warm and dry.     Findings: Bruising present. No erythema.       Neurological:     Mental Status: She is alert and oriented to person, place, and time.     Assessment & Plan:  This visit occurred during the SARS-CoV-2 public health emergency.  Safety protocols were in place, including screening questions prior to the visit, additional usage of staff PPE, and extensive cleaning of exam room while observing appropriate contact time as indicated for disinfecting solutions.   Aolani was seen today for acute visit.  Diagnoses and all orders for this visit:  Abnormal bruising -     CBC with Differential/Platelet -     PT and  PTT    Problem List Items Addressed This Visit    None    Visit Diagnoses    Abnormal bruising    -  Primary   Relevant Orders   CBC with Differential/Platelet   PT and PTT      Follow-up: No follow-ups on file.  Alysia Penna, NP

## 2020-02-07 NOTE — Patient Instructions (Signed)
Go to lab for blood draw  Contusion A contusion is a deep bruise. This is a result of an injury that causes bleeding under the skin. Symptoms of bruising include pain, swelling, and discolored skin. The skin may turn blue, purple, or yellow. Follow these instructions at home: Managing pain, stiffness, and swelling You may use RICE. This stands for:  Resting.  Icing.  Compression, or putting pressure.  Elevating, or raising the injured area. To follow this method, do these actions:  Rest the injured area.  If told, put ice on the injured area. ? Put ice in a plastic bag. ? Place a towel between your skin and the bag. ? Leave the ice on for 20 minutes, 2-3 times per day.  If told, put light pressure (compression) on the injured area using an elastic bandage. Make sure the bandage is not too tight. If the area tingles or becomes numb, remove it and put it back on as told by your doctor.  If possible, raise (elevate) the injured area above the level of your heart while you are sitting or lying down.  General instructions  Take over-the-counter and prescription medicines only as told by your doctor.  Keep all follow-up visits as told by your doctor. This is important. Contact a doctor if:  Your symptoms do not get better after several days of treatment.  Your symptoms get worse.  You have trouble moving the injured area. Get help right away if:  You have very bad pain.  You have a loss of feeling (numbness) in a hand or foot.  Your hand or foot turns pale or cold. Summary  A contusion is a deep bruise. This is a result of an injury that causes bleeding under the skin.  Symptoms of bruising include pain, swelling, and discolored skin. The skin may turn blue, purple, or yellow.  This condition is treated with rest, ice, compression, and elevation. This is also called RICE. You may be given over-the-counter medicines for pain.  Contact a doctor if you do not feel  better, or you feel worse. Get help right away if you have very bad pain, have lost feeling in a hand or foot, or the area turns pale or cold. This information is not intended to replace advice given to you by your health care provider. Make sure you discuss any questions you have with your health care provider. Document Revised: 01/12/2018 Document Reviewed: 01/12/2018 Elsevier Patient Education  2020 [employer].

## 2020-02-08 LAB — PT AND PTT
INR: 1 (ref 0.9–1.2)
Prothrombin Time: 10.2 s (ref 9.1–12.0)
aPTT: 26 s (ref 24–33)

## 2020-02-08 LAB — CBC WITH DIFFERENTIAL/PLATELET
Basophils Absolute: 0 10*3/uL (ref 0.0–0.2)
Basos: 1 %
EOS (ABSOLUTE): 0.1 10*3/uL (ref 0.0–0.4)
Eos: 2 %
Hematocrit: 41.5 % (ref 34.0–46.6)
Hemoglobin: 13.9 g/dL (ref 11.1–15.9)
Immature Grans (Abs): 0 10*3/uL (ref 0.0–0.1)
Immature Granulocytes: 0 %
Lymphocytes Absolute: 1.4 10*3/uL (ref 0.7–3.1)
Lymphs: 29 %
MCH: 31 pg (ref 26.6–33.0)
MCHC: 33.5 g/dL (ref 31.5–35.7)
MCV: 92 fL (ref 79–97)
Monocytes Absolute: 0.3 10*3/uL (ref 0.1–0.9)
Monocytes: 7 %
Neutrophils Absolute: 2.9 10*3/uL (ref 1.4–7.0)
Neutrophils: 61 %
Platelets: 197 10*3/uL (ref 150–450)
RBC: 4.49 x10E6/uL (ref 3.77–5.28)
RDW: 12.9 % (ref 11.7–15.4)
WBC: 4.7 10*3/uL (ref 3.4–10.8)

## 2020-02-13 ENCOUNTER — Ambulatory Visit: Payer: Managed Care, Other (non HMO) | Attending: Family Medicine | Admitting: Physical Therapy

## 2020-02-13 ENCOUNTER — Encounter: Payer: Self-pay | Admitting: Physical Therapy

## 2020-02-13 ENCOUNTER — Other Ambulatory Visit: Payer: Self-pay

## 2020-02-13 DIAGNOSIS — R262 Difficulty in walking, not elsewhere classified: Secondary | ICD-10-CM | POA: Insufficient documentation

## 2020-02-13 DIAGNOSIS — R29898 Other symptoms and signs involving the musculoskeletal system: Secondary | ICD-10-CM | POA: Diagnosis not present

## 2020-02-13 DIAGNOSIS — M533 Sacrococcygeal disorders, not elsewhere classified: Secondary | ICD-10-CM | POA: Diagnosis present

## 2020-02-13 DIAGNOSIS — M62838 Other muscle spasm: Secondary | ICD-10-CM | POA: Diagnosis present

## 2020-02-13 NOTE — Patient Instructions (Signed)
    Home exercise program created by Richards Pherigo, PT.  For questions, please contact Eastin Swing via phone at [phone] or email at [email or web address]  [facility] [place] [address]  [address] [place], [place], [address] Phone: [phone]   Fax:  [phone]    

## 2020-02-13 NOTE — Therapy (Addendum)
[facility] [place] [address]  [place], [place], [address] Phone: [phone]   Fax:  [phone]  Physical Therapy Treatment / Discharge Summary  Patient Details  Name: Jodine M [place] MRN: [id number] Date of Birth: September 01, 1990 Referring Provider (PT): Clearance Coots, MD   Encounter Date: 02/13/2020   PT End of Session - 02/13/20 1314    Visit Number 6    Number of Visits 8    Date for PT Re-Evaluation 02/17/20    Authorization Type [employer]igna    PT Start Time 1314    PT Stop Time 1407    PT Time Calculation (min) 53 min    Activity Tolerance Patient tolerated treatment well    Behavior During Therapy [facility] for tasks assessed/performed           Past Medical History:  Diagnosis Date  . ADHD   . Allergy   . Anxiety   . Anxiety   . Arthritis   . Back pain   . Bilateral swelling of feet   . Chronic fatigue syndrome   . Constipation   . Depression   . IBS (irritable bowel syndrome)   . Joint pain   . Lactose intolerance   . Migraine without aura     Past Surgical History:  Procedure Laterality Date  . INTRAUTERINE DEVICE (IUD) INSERTION  04/2017   Mirena   . KNEE ARTHROSCOPY Right     There were no vitals filed for this visit.   Subjective Assessment - 02/13/20 1316    Subjective Pt reporting more pain and tightness in lumbar paraspinals recently with minimal to no pain into LEs.    Pertinent History R meniscal surgery 2008    Patient Stated Goals "find something to help relieve the tighness and pain"    Currently in Pain? Yes    Pain Score 5     Pain Location Back    Pain Orientation Lower    Pain Descriptors / Indicators Tender    Pain Type Acute pain              [facility] PT Assessment - 02/13/20 1314      Assessment   Medical Diagnosis R piriformis syndrome    Referring Provider (PT) Clearance Coots, MD    Onset Date/Surgical Date --   4-6 months   Next MD Visit TBD                          [facility] Adult PT Treatment/Exercise - 02/13/20 1314      Ambulation/Gait   Stairs Yes    Stairs Assistance 7: Independent    Stair Management Technique No rails    Number of Stairs 14   2 sets   Height of Stairs 7    Gait Comments slight L hip Trendelenburg drop on descent due to limited R knee eccentric control and fear of knee buckling      Lumbar Exercises: Stretches   Quadruped Mid Back Stretch 30 seconds;3 reps    Quadruped Mid Back Stretch Limitations seated & quadruped 3-way prayer stretch with green Pball      Lumbar Exercises: Aerobic   Recumbent Bike L2 x 6 min      Lumbar Exercises: Supine   Pelvic Tilt 10 reps;5 seconds    Bent Knee Raise 10 reps;3 seconds    Bent Knee Raise Limitations brace marching - attetmpted up/up/down/down but increased pain with pressure on  lumbar spine    Dead Bug 10 reps;2 seconds    Dead Bug Limitations from hooklying      Lumbar Exercises: Sidelying   Other Sidelying Lumbar Exercises R/L open book stretch 5 x 10"                  PT Education - 02/13/20 1407    Education Details HEP review & update - core strengthening porgression (brace marching & dead bug)    Person(s) Educated Patient    Methods Explanation;Demonstration;Verbal cues;Handout    Comprehension Verbalized understanding;Verbal cues required;Returned demonstration            PT Short Term Goals - 02/13/20 1319      PT SHORT TERM GOAL #1   Title Patient will be independent with initial HEP    Status Achieved   01/21/20     PT SHORT TERM GOAL #2   Title Patient to demonstrate improved tissue quality and pliability with reduced pain    Status Partially Met   9/9//21 - better in glutes/piriformis but now tender in lumbar paraspinals            PT Long Term Goals - 02/13/20 1319      PT LONG TERM GOAL #1   Title Patient will be independent with ongoing/advanced HEP    Status Achieved   02/13/20     PT LONG TERM GOAL #2    Title Patient to report pain reduction in frequency and intensity by >/= 50%    Status Achieved   02/13/20 - pt reports 70% improvement in pain     PT LONG TERM GOAL #3   Title Patient will negotiate stairs reciprocally with normal step pattern w/o limitation due to R LE pain or weakness    Status Partially Met   02/13/20 - able to naviagate stairs reciproaclly however increased L hip Trendelenburg drop to compensate for decreased R knee eccentric control     PT LONG TERM GOAL #4   Title Patient to report ability to perform work and daily activities without pain provocation    Status On-going                 Plan - 02/13/20 1320    Clinical Impression Statement Deneise Lever reports she has been better able to manage the pain and tightness in her R buttock with less LE radicular pain but now noting increased tenderness and pain in B lumbar paraspinals - pain 70% improved overall. She is more consistently able to navigate stairs reciprocally but does demonstrate tendency for slight L hip Trendelenburg drop on descent due to favoring of R knee for fear of knee buckling. Pain still typically increases as workday progresses but she feels like she has more strategies to manage the pain with stretches and self STM. Goals only partially met at this time but pt expressing interest in trying a 30-day hold to see how she will manage with an ongoing HEP due to high copay ($60).    Comorbidities Chronic R LBP with R sciatica, OA, R knee pain (Jumper's knee), obesity, ADHD, anxiety and depression    Rehab Potential Good    PT Frequency 1x / week    PT Duration 8 weeks    PT Treatment/Interventions ADLs/Self Care Home Management;Cryotherapy;Electrical Stimulation;Iontophoresis 25m/ml Dexamethasone;Moist Heat;Ultrasound;Gait training;Stair training;Functional mobility training;Therapeutic activities;Therapeutic exercise;Balance training;Neuromuscular re-education;Patient/family education;Manual techniques;Passive  range of motion;Dry needling;Taping;Spinal Manipulations;Joint Manipulations    PT Next Visit Plan 30-day hold    PT Home  Exercise Plan 7/19 - QL & glute/piriformis stretches; 7/28 - bridge + hip abduction red TB, glute massage with tennis ball on wall; 8/6 - standing hip abduction, counter squat, sidelying clam shell; 8/30 - standing ITB/QL stretch, glute med hip kicker, quadruped hip extension & fire hydrants; 9/9 - core strengthening porgression (brace marching & dead bug)    Consulted and Agree with Plan of Care Patient           Patient will benefit from skilled therapeutic intervention in order to improve the following deficits and impairments:  Decreased activity tolerance, Decreased mobility, Decreased strength, Difficulty walking, Increased fascial restricitons, Increased muscle spasms, Impaired perceived functional ability, Impaired flexibility, Impaired sensation, Improper body mechanics, Postural dysfunction, Pain  Visit Diagnosis: Other symptoms and signs involving the musculoskeletal system  Sacrococcygeal disorders, not elsewhere classified  Other muscle spasm  Difficulty in walking, not elsewhere classified     Problem List Patient Active Problem List   Diagnosis Date Noted  . Other hyperlipidemia 12/12/2019  . 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 Vitamin D deficiency 11/25/2019  . Piriformis syndrome of right side 11/11/2019  . Cough variant asthma 09/26/2019  . Excessive daytime sleepiness 03/13/2019  . 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 Acute right ankle pain 10/24/2018  . Class 3 severe obesity with serious comorbidity and body mass index (BMI) of 40.0 to 44.9 in adult [facility]) 08/24/2018  . GAD (generalized anxiety disorder) 08/24/2018  . Acute pain of right knee 07/23/2018  . Chronic right-sided low back pain with right-sided sciatica 05/15/2018  . Moderate episode of recurrent major depressive disorder ([place]) 09/05/2017  . Migraine without aura   .  Arthritis   . Allergy   . Increased bowel frequency 07/12/2016  . Increased urinary frequency 07/12/2016  . Rhinitis, allergic 09/09/2014    Percival Spanish, PT, MPT 02/13/2020, 2:25 PM  [facility] [address]  [place], [place], [address] Phone: [phone]   Fax:  [phone]  Name: Sherel M [place] MRN: [id number] Date of Birth: 08-04-1990  PHYSICAL THERAPY DISCHARGE SUMMARY  Visits from Start of Care: 6  Current functional level related to goals / functional outcomes:   Refer to above clinical impression for status as of last visit on 02/13/2020. Patient was placed on hold for 30 days and has not needed to return to PT, therefore will proceed with discharge from PT for this episode.   Remaining deficits:   As above.    Education / Equipment:   HEP  Plan: Patient agrees to discharge.  Patient goals were partially met. Patient is being discharged due to being pleased with the current functional level.  ?????     Percival Spanish, PT, MPT 03/16/20, 8:18 AM  [facility] [place], [place], [address] Phone: [phone]   Fax:  [phone]

## 2020-02-18 ENCOUNTER — Ambulatory Visit (INDEPENDENT_AMBULATORY_CARE_PROVIDER_SITE_OTHER): Payer: Managed Care, Other (non HMO) | Admitting: Family Medicine

## 2020-02-27 ENCOUNTER — Other Ambulatory Visit: Payer: Self-pay

## 2020-02-27 ENCOUNTER — Ambulatory Visit (INDEPENDENT_AMBULATORY_CARE_PROVIDER_SITE_OTHER): Payer: Managed Care, Other (non HMO) | Admitting: Family Medicine

## 2020-02-27 ENCOUNTER — Encounter (INDEPENDENT_AMBULATORY_CARE_PROVIDER_SITE_OTHER): Payer: Self-pay | Admitting: Family Medicine

## 2020-02-27 VITALS — BP 113/64 | HR 72 | Temp 98.1°F | Ht 67.0 in | Wt 278.0 lb

## 2020-02-27 DIAGNOSIS — F3289 Other specified depressive episodes: Secondary | ICD-10-CM

## 2020-02-27 DIAGNOSIS — Z6841 Body Mass Index (BMI) 40.0 and over, adult: Secondary | ICD-10-CM | POA: Diagnosis not present

## 2020-02-27 DIAGNOSIS — E559 Vitamin D deficiency, unspecified: Secondary | ICD-10-CM

## 2020-02-27 DIAGNOSIS — Z9189 Other specified personal risk factors, not elsewhere classified: Secondary | ICD-10-CM

## 2020-02-27 MED ORDER — WEGOVY 0.25 MG/0.5ML ~~LOC~~ SOAJ: 0.2500 mg | SUBCUTANEOUS | 0 refills | Status: DC

## 2020-02-28 NOTE — Progress Notes (Signed)
Chief Complaint:   OBESITY Carmen Wall is here to discuss her progress with her obesity treatment plan along with follow-up of her obesity related diagnoses. Carmen Wall is on Optavia and states she is following her eating plan approximately 40% of the time. Carmen Wall states she is doing 0 minutes 0 times per week.  Today's visit was #: 11 Starting weight: 282 lbs Starting date: 07/03/2019 Today's weight: 278 lbs Today's date: 02/27/2020 Total lbs lost to date: 4 Total lbs lost since last in-office visit: 0  Interim History: Carmen Wall has not been doing well on Optavia and she notes she has had severe stress at work. She is working overtime and has been exhausted. She is up 1 lb today. She notes polyphagia. She is interested in a weight loss medications.   Subjective:   1. Vitamin D deficiency Carmen Wall's Vit D level is low at 24. She is on prescription Vit D weekly.  2. Other depression with emotional eating  Carmen Wall is doing a significant amount of emotional eating. Her mood is stable but she feels down after making bad food choices. She is on Lexapro, bupropion, and Buspar.  3. At risk for side effect of medication Carmen Wall is at risk for drug side effects due to start of Wegovy (nausea and constipation).  Assessment/Plan:   1. Vitamin D deficiency  Rawan agreed to continue taking prescription Vitamin D 50,000 IU every week.  2. Other depression with emotional eating  Dachelle will continue bupropion, Buspar, and Lexapro.   3. At risk for side effect of medication Carmen Wall was given approximately 15 minutes of drug side effect counseling today due to start of Wegovy. We discussed side effect possibility and risk versus benefits. Carmen Wall agreed to the medication and will contact this office if these side effects are intolerable.  Repetitive spaced learning was employed today to elicit superior memory formation and behavioral change.  4. Class 3 severe  obesity with serious comorbidity and body mass index (BMI) of 45.0 to 49.9 in adult, unspecified obesity type (HCC) Carmen Wall is currently in the action stage of change. As such, her goal is to continue with weight loss efforts. She has agreed to practicing portion control and making smarter food choices, such as increasing vegetables and decreasing simple carbohydrates, on the Optavia plan.   We both agreed to start Wegovy 0.25 mg SubQ weekly, with no refills. Carmen Wall denies personal or family history of thyroid cancer, history of pancreatitis, or current cholelithiasis.She was informed of side effects.   - Semaglutide-Weight Management (WEGOVY) 0.25 MG/0.5ML SOAJ; Inject 0.25 mg into the skin once a week.  Dispense: 2 mL; Refill: 0  Exercise goals: No exercise has been prescribed at this time.  Behavioral modification strategies: increasing lean protein intake and decreasing simple carbohydrates.  Carmen Wall has agreed to follow-up with our clinic in 2 to 3 weeks.  Objective:   Blood pressure 113/64, pulse 72, temperature 98.1 F (36.7 C), temperature source Oral, height 5\' 7"  (1.702 m), weight 278 lb (126.1 kg), SpO2 98 %. Body mass index is 43.54 kg/m.  General: Cooperative, alert, well developed, in no acute distress. HEENT: Conjunctivae and lids unremarkable. Cardiovascular: Regular rhythm.  Lungs: Normal work of breathing. Neurologic: No focal deficits.   Lab Results  Component Value Date   CREATININE 0.79 07/03/2019   BUN 14 07/03/2019   NA 140 07/03/2019   K 4.3 07/03/2019   CL 103 07/03/2019   CO2 23 07/03/2019   Lab Results  Component Value Date  ALT 14 07/03/2019   AST 15 07/03/2019   ALKPHOS 64 07/03/2019   BILITOT 0.5 07/03/2019   Lab Results  Component Value Date   HGBA1C 4.9 07/03/2019   HGBA1C 4.9 09/05/2017   Lab Results  Component Value Date   INSULIN 14.9 11/21/2019   INSULIN 16.9 07/03/2019   Lab Results  Component Value Date   TSH  0.655 07/03/2019   Lab Results  Component Value Date   CHOL 205 (H) 11/21/2019   HDL 47 11/21/2019   LDLCALC 139 (H) 11/21/2019   TRIG 108 11/21/2019   CHOLHDL 4 08/24/2018   Lab Results  Component Value Date   WBC 4.7 02/07/2020   HGB 13.9 02/07/2020   HCT 41.5 02/07/2020   MCV 92 02/07/2020   PLT 197 02/07/2020   No results found for: IRON, TIBC, FERRITIN  Attestation Statements:   Reviewed by clinician on day of visit: allergies, medications, problem list, medical history, surgical history, family history, social history, and previous encounter notes.   Trude Mcburney, am acting as Energy manager for [place], FNP-C.  I have reviewed the above documentation for accuracy and completeness, and I agree with the above. - Jesse Sans, FNP

## 2020-03-02 ENCOUNTER — Other Ambulatory Visit: Payer: Self-pay

## 2020-03-02 ENCOUNTER — Encounter: Payer: Self-pay | Admitting: Nurse Practitioner

## 2020-03-02 ENCOUNTER — Encounter (INDEPENDENT_AMBULATORY_CARE_PROVIDER_SITE_OTHER): Payer: Self-pay | Admitting: Family Medicine

## 2020-03-02 ENCOUNTER — Ambulatory Visit: Payer: Managed Care, Other (non HMO) | Admitting: Nurse Practitioner

## 2020-03-02 VITALS — BP 130/88 | HR 80 | Temp 97.3°F | Ht 67.0 in | Wt 281.0 lb

## 2020-03-02 DIAGNOSIS — F418 Other specified anxiety disorders: Secondary | ICD-10-CM | POA: Insufficient documentation

## 2020-03-02 DIAGNOSIS — R1013 Epigastric pain: Secondary | ICD-10-CM

## 2020-03-02 MED ORDER — OMEPRAZOLE 20 MG PO CPDR: 20.0000 mg | DELAYED_RELEASE_CAPSULE | Freq: Two times a day (BID) | ORAL | 0 refills | Status: DC

## 2020-03-02 NOTE — Patient Instructions (Signed)
Go to lab for H. Pylori test Start omeprazole BID You will be contacted to schedule appt for ABD [place]

## 2020-03-02 NOTE — Progress Notes (Signed)
Subjective:  Patient ID: Carmen Wall, female    DOB: February 01, 1991  Age: 29 y.o. MRN: [id number]  CC: Acute Visit (Pt c/o nausea x 1 week. Worse in the morning and last until around 2pm. Pt states bowel movements have been different also. Denies any vomiting, fever, body aches or chills. Pt states she has a IUD took a pregnancy test 2 days ago and it was negative. )  GI Problem The primary symptoms include abdominal pain and nausea. Primary symptoms do not include fever, weight loss, fatigue, vomiting, diarrhea, melena, hematemesis, jaundice, hematochezia, dysuria, myalgias, arthralgias or rash. The illness began 6 to 7 days ago. The onset was gradual. The problem has not changed since onset. The illness does not include chills, anorexia, dysphagia, odynophagia, bloating, constipation, tenesmus, back pain or itching. Significant associated medical issues include GERD. Associated medical issues do not include alcohol abuse or irritable bowel syndrome.   Reviewed past Medical, Social and Family history today.  Outpatient Medications Prior to Visit  Medication Sig Dispense Refill  . Acetaminophen-Caffeine (EXCEDRIN TENSION HEADACHE PO) Take by mouth.    Marland Kitchen albuterol (VENTOLIN HFA) 108 (90 Base) MCG/ACT inhaler Inhale 1 puff into the lungs every 6 (six) hours as needed for wheezing or shortness of breath. 6.7 g 1  . buPROPion (WELLBUTRIN XL) 150 MG 24 hr tablet Take 1 tablet (150 mg total) by mouth daily. 90 tablet 3  . busPIRone (BUSPAR) 15 MG tablet Take 1 tablet (15 mg total) by mouth 2 (two) times daily. 180 tablet 3  . escitalopram (LEXAPRO) 20 MG tablet Take 1 tablet (20 mg total) by mouth at bedtime. 90 tablet 3  . levonorgestrel (MIRENA) 20 MCG/24HR IUD 1 each by Intrauterine route once.    . Semaglutide-Weight Management (WEGOVY) 0.25 MG/0.5ML SOAJ Inject 0.25 mg into the skin once a week. 2 mL 0  . tiZANidine (ZANAFLEX) 4 MG capsule Take 1 capsule (4 mg total) by mouth 3 (three)  times daily as needed for muscle spasms. 14 capsule 0  . traZODone (DESYREL) 50 MG tablet TAKE 1 TABLET BY MOUTH EVERY NIGHT AT BEDTIME 90 tablet 1  . Vitamin D, Ergocalciferol, (DRISDOL) 1.25 MG (50000 UNIT) CAPS capsule Take 1 capsule (50,000 Units total) by mouth every 7 (seven) days. 4 capsule 0   No facility-administered medications prior to visit.    ROS See HPI  Objective:  BP 130/88 (BP Location: Left Arm, Patient Position: Sitting, Cuff Size: Normal)   Pulse 80   Temp (!) 97.3 F (36.3 C) (Temporal)   Ht 5\' 7"  (1.702 m)   Wt 281 lb (127.5 kg)   SpO2 97%   BMI 44.01 kg/m   Physical Exam Vitals and nursing note reviewed.  Constitutional:      Appearance: She is obese.  Pulmonary:     Effort: Pulmonary effort is normal.  Abdominal:     General: Bowel sounds are normal.     Palpations: Abdomen is soft.     Tenderness: There is abdominal tenderness. There is no right CVA tenderness, left CVA tenderness or guarding.     Hernia: No hernia is present.  Neurological:     Mental Status: She is alert.    Assessment & Plan:  This visit occurred during the SARS-CoV-2 public health emergency.  Safety protocols were in place, including screening questions prior to the visit, additional usage of staff PPE, and extensive cleaning of exam room while observing appropriate contact time as indicated for disinfecting solutions.  Carmen Wall was seen today for acute visit.  Diagnoses and all orders for this visit:  Dyspepsia -     H. pylori breath test -     omeprazole (PRILOSEC) 20 MG capsule; Take 1 capsule (20 mg total) by mouth 2 (two) times daily before a meal. -     US Abdomen Limited RUQ; Future  Epigastric pain -     US Abdomen Limited RUQ; Future   Problem List Items Addressed This Visit    None    Visit Diagnoses    Dyspepsia    -  Primary   Relevant Medications   omeprazole (PRILOSEC) 20 MG capsule   Other Relevant Orders   H. pylori breath test   US Abdomen  Limited RUQ   Epigastric pain       Relevant Orders   US Abdomen Limited RUQ      Follow-up: No follow-ups on file.  Alysia Penna, NP

## 2020-03-03 LAB — H. PYLORI BREATH TEST: H. pylori Breath Test: NOT DETECTED

## 2020-03-12 ENCOUNTER — Ambulatory Visit (INDEPENDENT_AMBULATORY_CARE_PROVIDER_SITE_OTHER): Payer: Managed Care, Other (non HMO) | Admitting: Family Medicine

## 2020-03-12 ENCOUNTER — Encounter (INDEPENDENT_AMBULATORY_CARE_PROVIDER_SITE_OTHER): Payer: Self-pay | Admitting: Family Medicine

## 2020-03-12 ENCOUNTER — Other Ambulatory Visit: Payer: Self-pay

## 2020-03-12 VITALS — BP 123/78 | HR 66 | Temp 98.0°F | Ht 67.0 in | Wt 280.0 lb

## 2020-03-12 DIAGNOSIS — E8881 Metabolic syndrome: Secondary | ICD-10-CM | POA: Diagnosis not present

## 2020-03-12 DIAGNOSIS — E559 Vitamin D deficiency, unspecified: Secondary | ICD-10-CM | POA: Diagnosis not present

## 2020-03-12 DIAGNOSIS — E88819 Insulin resistance, unspecified: Secondary | ICD-10-CM

## 2020-03-12 DIAGNOSIS — Z6841 Body Mass Index (BMI) 40.0 and over, adult: Secondary | ICD-10-CM | POA: Diagnosis not present

## 2020-03-12 DIAGNOSIS — Z9189 Other specified personal risk factors, not elsewhere classified: Secondary | ICD-10-CM

## 2020-03-12 MED ORDER — OZEMPIC (0.25 OR 0.5 MG/DOSE) 2 MG/1.5ML ~~LOC~~ SOPN: 0.2500 mg | PEN_INJECTOR | SUBCUTANEOUS | 0 refills | Status: DC

## 2020-03-12 MED ORDER — VITAMIN D (ERGOCALCIFEROL) 1.25 MG (50000 UNIT) PO CAPS: 50000.0000 [IU] | ORAL_CAPSULE | ORAL | 0 refills | Status: DC

## 2020-03-12 NOTE — Progress Notes (Signed)
Chief Complaint:   OBESITY Carmen Wall is here to discuss her progress with her obesity treatment plan along with follow-up of her obesity related diagnoses. Carmen Wall is on practicing portion control and making smarter food choices, such as increasing vegetables and decreasing simple carbohydrates and states she is following her eating plan approximately 50% of the time. Carmen Wall states she is doing 0 minutes 0 times per week.  Today's visit was #: 12 Starting weight: 282 lbs Starting date: 07/03/2019 Today's weight: 280 lbs Today's date: 03/12/2020 Total lbs lost to date: 2 Total lbs lost since last in-office visit: 0  Interim History: Carmen Wall has not been following a plan, but she has been trying to increase protein and decrease carbohydrates. She has been off of the [place] plan and she has no structure to her eating. She was unable to afford Wegovy.  Subjective:   1. Insulin resistance Carmen Wall has a diagnosis of insulin resistance based on her elevated fasting insulin level >5. She notes some hunger. Her insurance would not cover Wegovy, and we will try Ozempic. If insurance will not cover Ozempic, then we will try metformin.  Lab Results  Component Value Date   INSULIN 14.9 11/21/2019   INSULIN 16.9 07/03/2019   Lab Results  Component Value Date   HGBA1C 4.9 07/03/2019   2. Vitamin D deficiency Carmen Wall's last Vit D level was low at 24.1. She is on weekly prescription Vit D,  3. At risk for side effect of medication Carmen Wall is at risk for drug side effects due to starting Ozempic.  Assessment/Plan:   1. Insulin resistance Carmen Wall will continue to work on weight loss, exercise, and decreasing simple carbohydrates to help decrease the risk of diabetes. Carmen Wall agreed to start Ozempic 0.25 mg SubQ weekly with no refill. Carmen Wall agreed to follow-up with [place] as directed to closely monitor her progress.  - Semaglutide,0.25 or 0.5MG /DOS, (OZEMPIC,  0.25 OR 0.5 MG/DOSE,) 2 MG/1.5ML SOPN; Inject 0.25 mg into the skin once a week.  Dispense: 1.5 mL; Refill: 0  2. Vitamin D deficiency  We will refill prescription Vitamin D for 1 month.   - Vitamin D, Ergocalciferol, (DRISDOL) 1.25 MG (50000 UNIT) CAPS capsule; Take 1 capsule (50,000 Units total) by mouth every 7 (seven) days.  Dispense: 4 capsule; Refill: 0  3. At risk for side effect of medication Carmen Wall was given approximately 15 minutes of drug side effect counseling today due to start of Ozempic.  We discussed side effect possibility and risk versus benefits. Carmen Wall agreed to the medication and will contact this office if these side effects are intolerable.  Repetitive spaced learning was employed today to elicit superior memory formation and behavioral change.  4. Class 3 severe obesity with serious comorbidity and body mass index (BMI) of 40.0 to 44.9 in adult, unspecified obesity type (HCC) Carmen Wall is currently in the action stage of change. As such, her goal is to continue with weight loss efforts. She has agreed to the Category 4 Plan.   Exercise goals: No exercise has been prescribed at this time.  Behavioral modification strategies: increasing lean protein intake and decreasing simple carbohydrates. Handouts: cat 4 plan and breakfast/lunch options.  Carmen Wall has agreed to follow-up with our clinic in 3 weeks.   Objective:   Blood pressure 123/78, pulse 66, temperature 98 F (36.7 C), temperature source Oral, height 5\' 7"  (1.702 m), weight 280 lb (127 kg), SpO2 99 %. Body mass index is 43.85 kg/m.  General: Cooperative, alert, well  developed, in no acute distress. HEENT: Conjunctivae and lids unremarkable. Cardiovascular: Regular rhythm.  Lungs: Normal work of breathing. Neurologic: No focal deficits.   Lab Results  Component Value Date   CREATININE 0.79 07/03/2019   BUN 14 07/03/2019   NA 140 07/03/2019   K 4.3 07/03/2019   CL 103 07/03/2019   CO2  23 07/03/2019   Lab Results  Component Value Date   ALT 14 07/03/2019   AST 15 07/03/2019   ALKPHOS 64 07/03/2019   BILITOT 0.5 07/03/2019   Lab Results  Component Value Date   HGBA1C 4.9 07/03/2019   HGBA1C 4.9 09/05/2017   Lab Results  Component Value Date   INSULIN 14.9 11/21/2019   INSULIN 16.9 07/03/2019   Lab Results  Component Value Date   TSH 0.655 07/03/2019   Lab Results  Component Value Date   CHOL 205 (H) 11/21/2019   HDL 47 11/21/2019   LDLCALC 139 (H) 11/21/2019   TRIG 108 11/21/2019   CHOLHDL 4 08/24/2018   Lab Results  Component Value Date   WBC 4.7 02/07/2020   HGB 13.9 02/07/2020   HCT 41.5 02/07/2020   MCV 92 02/07/2020   PLT 197 02/07/2020   No results found for: IRON, TIBC, FERRITIN  Attestation Statements:   Reviewed by clinician on day of visit: allergies, medications, problem list, medical history, surgical history, family history, social history, and previous encounter notes.   Trude Mcburney, am acting as Energy manager for [place], FNP-C.  I have reviewed the above documentation for accuracy and completeness, and I agree with the above. -  Jesse Sans, FNP

## 2020-03-16 ENCOUNTER — Encounter (INDEPENDENT_AMBULATORY_CARE_PROVIDER_SITE_OTHER): Payer: Self-pay | Admitting: Family Medicine

## 2020-03-31 ENCOUNTER — Encounter (INDEPENDENT_AMBULATORY_CARE_PROVIDER_SITE_OTHER): Payer: Self-pay | Admitting: Family Medicine

## 2020-03-31 ENCOUNTER — Other Ambulatory Visit: Payer: Self-pay

## 2020-03-31 ENCOUNTER — Ambulatory Visit (INDEPENDENT_AMBULATORY_CARE_PROVIDER_SITE_OTHER): Payer: Managed Care, Other (non HMO) | Admitting: Family Medicine

## 2020-03-31 VITALS — BP 120/74 | HR 78 | Temp 98.3°F | Ht 67.0 in | Wt 281.0 lb

## 2020-03-31 DIAGNOSIS — E8881 Metabolic syndrome: Secondary | ICD-10-CM

## 2020-03-31 DIAGNOSIS — E66813 Obesity, class 3: Secondary | ICD-10-CM

## 2020-03-31 DIAGNOSIS — E88819 Insulin resistance, unspecified: Secondary | ICD-10-CM

## 2020-03-31 DIAGNOSIS — Z6841 Body Mass Index (BMI) 40.0 and over, adult: Secondary | ICD-10-CM | POA: Diagnosis not present

## 2020-03-31 DIAGNOSIS — Z9189 Other specified personal risk factors, not elsewhere classified: Secondary | ICD-10-CM | POA: Diagnosis not present

## 2020-03-31 DIAGNOSIS — F418 Other specified anxiety disorders: Secondary | ICD-10-CM | POA: Diagnosis not present

## 2020-03-31 MED ORDER — METFORMIN HCL 500 MG PO TABS: 500.0000 mg | ORAL_TABLET | Freq: Every day | ORAL | 0 refills | Status: DC

## 2020-03-31 MED ORDER — BUPROPION HCL ER (XL) 300 MG PO TB24: 300.0000 mg | ORAL_TABLET | Freq: Every day | ORAL | 0 refills | Status: DC

## 2020-04-01 NOTE — Progress Notes (Signed)
Chief Complaint:   OBESITY Carmen Wall is here to discuss her progress with her obesity treatment plan along with follow-up of her obesity related diagnoses. Carmen Wall is on the Category 4 Plan and states she is following her eating plan approximately 40% of the time. Carmen Wall states she is doing 0 minutes 0 times per week.  Today's visit was #: 13 Starting weight: 282 lbs Starting date: 07/03/2019 Today's weight: 281 lbs Today's date: 03/31/2020 Total lbs lost to date: 1 Total lbs lost since last in-office visit: 0  Interim History: [name] is struggling to stick to the plan due to excessive overtime hours and no time to plan meals, buy groceries, or cook. She has been trying to make good choices. She is not drinking any sugary beverages. She does tend to snack out of the vending machine at work.   Subjective:   1. Insulin resistance Carmen Wall and Reginal Lutes was denied by her insurance. Carmen Wall notes some hunger.  2. Depression with anxiety Carmen Wall is on bupropion 150 XR and Lexapro 20 mg, as well as Buspar as needed. She takes trazodone nightly for sleep. She does note increased stress eating due to work stress and fatigue.   3. At risk for side effect of medication Carmen Wall is at risk for drug side effects due to starting metformin.  Assessment/Plan:   1. Insulin resistance  Carmen Wall agreed to start metformin 500 mg q AM with no refills.  - metFORMIN (GLUCOPHAGE) 500 MG tablet; Take 1 tablet (500 mg total) by mouth daily with breakfast.  Dispense: 30 tablet; Refill: 0  2. Depression with anxiety . Carmen Wall agreed to increase bupropion XR to 300 mg q daily with no refills.  - buPROPion (WELLBUTRIN XL) 300 MG 24 hr tablet; Take 1 tablet (300 mg total) by mouth daily.  Dispense: 30 tablet; Refill: 0  3. At risk for side effect of medication Carmen Wall was given approximately 15 minutes of drug side effect counseling today.  We discussed side effect possibility  and risk versus benefits of both metformin and increased dose of bupropion.Carmen Wall agreed to the medication and will contact this office if these side effects are intolerable.  Repetitive spaced learning was employed today to elicit superior memory formation and behavioral change.  4. Class 3 severe obesity with serious comorbidity and body mass index (BMI) of 40.0 to 44.9 in adult, unspecified obesity type (HCC) Carmen Wall is currently in the action stage of change. As such, her goal is to continue with weight loss efforts. She has agreed to practicing portion control and making smarter food choices, such as increasing vegetables and decreasing simple carbohydrates.   Exercise goals: No exercise has been prescribed at this time.  Behavioral modification strategies: increasing lean protein intake, decreasing simple carbohydrates, meal planning and cooking strategies and better snacking choices.  Carmen Wall has agreed to follow-up with our clinic in 3 to 4 weeks.   Objective:   Blood pressure 120/74, pulse 78, temperature 98.3 F (36.8 C), height 5\' 7"  (1.702 m), weight 281 lb (127.5 kg), SpO2 97 %. Body mass index is 44.01 kg/m.  General: Cooperative, alert, well developed, in no acute distress. HEENT: Conjunctivae and lids unremarkable. Cardiovascular: Regular rhythm.  Lungs: Normal work of breathing. Neurologic: No focal deficits.   Lab Results  Component Value Date   CREATININE 0.79 07/03/2019   BUN 14 07/03/2019   NA 140 07/03/2019   K 4.3 07/03/2019   CL 103 07/03/2019   CO2 23 07/03/2019   Lab  Results  Component Value Date   ALT 14 07/03/2019   AST 15 07/03/2019   ALKPHOS 64 07/03/2019   BILITOT 0.5 07/03/2019   Lab Results  Component Value Date   HGBA1C 4.9 07/03/2019   HGBA1C 4.9 09/05/2017   Lab Results  Component Value Date   INSULIN 14.9 11/21/2019   INSULIN 16.9 07/03/2019   Lab Results  Component Value Date   TSH 0.655 07/03/2019   Lab Results   Component Value Date   CHOL 205 (H) 11/21/2019   HDL 47 11/21/2019   LDLCALC 139 (H) 11/21/2019   TRIG 108 11/21/2019   CHOLHDL 4 08/24/2018   Lab Results  Component Value Date   WBC 4.7 02/07/2020   HGB 13.9 02/07/2020   HCT 41.5 02/07/2020   MCV 92 02/07/2020   PLT 197 02/07/2020   No results found for: IRON, TIBC, FERRITIN  Attestation Statements:   Reviewed by clinician on day of visit: allergies, medications, problem list, medical history, surgical history, family history, social history, and previous encounter notes.   Trude Mcburney, am acting as Energy manager for [place], FNP-C.  I have reviewed the above documentation for accuracy and completeness, and I agree with the above. -  Jesse Sans, FNP

## 2020-04-02 ENCOUNTER — Encounter (INDEPENDENT_AMBULATORY_CARE_PROVIDER_SITE_OTHER): Payer: Self-pay | Admitting: Family Medicine

## 2020-04-21 ENCOUNTER — Ambulatory Visit (INDEPENDENT_AMBULATORY_CARE_PROVIDER_SITE_OTHER): Payer: Managed Care, Other (non HMO) | Admitting: Family Medicine

## 2020-04-22 ENCOUNTER — Ambulatory Visit (INDEPENDENT_AMBULATORY_CARE_PROVIDER_SITE_OTHER): Payer: Managed Care, Other (non HMO) | Admitting: Family Medicine

## 2020-04-22 ENCOUNTER — Other Ambulatory Visit: Payer: Self-pay

## 2020-04-22 ENCOUNTER — Encounter (INDEPENDENT_AMBULATORY_CARE_PROVIDER_SITE_OTHER): Payer: Self-pay | Admitting: Family Medicine

## 2020-04-22 VITALS — BP 105/69 | HR 69 | Temp 98.2°F | Ht 67.0 in | Wt 283.0 lb

## 2020-04-22 DIAGNOSIS — F3289 Other specified depressive episodes: Secondary | ICD-10-CM | POA: Diagnosis not present

## 2020-04-22 DIAGNOSIS — E7849 Other hyperlipidemia: Secondary | ICD-10-CM

## 2020-04-22 DIAGNOSIS — E88819 Insulin resistance, unspecified: Secondary | ICD-10-CM

## 2020-04-22 DIAGNOSIS — Z6841 Body Mass Index (BMI) 40.0 and over, adult: Secondary | ICD-10-CM

## 2020-04-22 DIAGNOSIS — E559 Vitamin D deficiency, unspecified: Secondary | ICD-10-CM

## 2020-04-22 DIAGNOSIS — Z9189 Other specified personal risk factors, not elsewhere classified: Secondary | ICD-10-CM | POA: Diagnosis not present

## 2020-04-22 DIAGNOSIS — E8881 Metabolic syndrome: Secondary | ICD-10-CM | POA: Diagnosis not present

## 2020-04-22 DIAGNOSIS — K5909 Other constipation: Secondary | ICD-10-CM | POA: Diagnosis not present

## 2020-04-22 MED ORDER — METFORMIN HCL 500 MG PO TABS: 500.0000 mg | ORAL_TABLET | Freq: Two times a day (BID) | ORAL | 0 refills | Status: DC

## 2020-04-22 MED ORDER — VITAMIN D (ERGOCALCIFEROL) 1.25 MG (50000 UNIT) PO CAPS: 50000.0000 [IU] | ORAL_CAPSULE | ORAL | 0 refills | Status: DC

## 2020-04-23 LAB — VITAMIN D 25 HYDROXY (VIT D DEFICIENCY, FRACTURES): Vit D, 25-Hydroxy: 23.7 ng/mL — ABNORMAL LOW (ref 30.0–100.0)

## 2020-04-23 LAB — COMPREHENSIVE METABOLIC PANEL
ALT: 16 IU/L (ref 0–32)
AST: 13 IU/L (ref 0–40)
Albumin/Globulin Ratio: 1.6 (ref 1.2–2.2)
Albumin: 4.1 g/dL (ref 3.8–4.8)
Alkaline Phosphatase: 77 IU/L (ref 44–121)
BUN/Creatinine Ratio: 15 (ref 9–23)
BUN: 11 mg/dL (ref 6–20)
Bilirubin Total: 0.5 mg/dL (ref 0.0–1.2)
CO2: 26 mmol/L (ref 20–29)
Calcium: 9.3 mg/dL (ref 8.7–10.2)
Chloride: 101 mmol/L (ref 96–106)
Creatinine, Ser: 0.75 mg/dL (ref 0.57–1.00)
GFR calc Af Amer: 123 mL/min/{1.73_m2} (ref >59)
GFR calc non Af Amer: 107 mL/min/{1.73_m2} (ref >59)
Globulin, Total: 2.5 g/dL (ref 1.5–4.5)
Glucose: 79 mg/dL (ref 65–99)
Potassium: 4.5 mmol/L (ref 3.5–5.2)
Sodium: 139 mmol/L (ref 134–144)
Total Protein: 6.6 g/dL (ref 6.0–8.5)

## 2020-04-23 LAB — LIPID PANEL WITH LDL/HDL RATIO
Cholesterol, Total: 218 mg/dL — ABNORMAL HIGH (ref 100–199)
HDL: 47 mg/dL (ref >39)
LDL Chol Calc (NIH): 148 mg/dL — ABNORMAL HIGH (ref 0–99)
LDL/HDL Ratio: 3.1 ratio (ref 0.0–3.2)
Triglycerides: 127 mg/dL (ref 0–149)
VLDL Cholesterol Cal: 23 mg/dL (ref 5–40)

## 2020-04-23 LAB — HEMOGLOBIN A1C
Est. average glucose Bld gHb Est-mCnc: 103 mg/dL
Hgb A1c MFr Bld: 5.2 % (ref 4.8–5.6)

## 2020-04-23 LAB — INSULIN, RANDOM: INSULIN: 15.7 u[IU]/mL (ref 2.6–24.9)

## 2020-04-27 ENCOUNTER — Encounter (INDEPENDENT_AMBULATORY_CARE_PROVIDER_SITE_OTHER): Payer: Self-pay | Admitting: Family Medicine

## 2020-04-27 NOTE — Progress Notes (Signed)
Chief Complaint:   OBESITY Carmen Wall is here to discuss her progress with her obesity treatment plan along with follow-up of her obesity related diagnoses. Carmen Wall is on practicing portion control and making smarter food choices, such as increasing vegetables and decreasing simple carbohydrates and states she is following her eating plan approximately 90% of the time. Carmen Wall states she is doing 0 minutes 0 times per week.  Today's visit was #: 14 Starting weight: 282 lbs Starting date: 07/03/2019 Today's weight: 283 lbs Today's date: 04/22/2020 Total lbs lost to date: 0 Total lbs lost since last in-office visit: 0 (+2)  Interim History: Carmen Wall is up 2 lbs today. She is working quite a bit of OT at work. Carmen Wall is drinking some orange juice recently. She is not usually skipping meals. She does run out of groceries at times, due to lack of  time and money. She notes hunger between lunch and dinner. She does tend to snack on carbohydrates. She sometimes resorts to snacks from vending machines at work.  Subjective:   1. Vitamin D deficiency Carmen Wall's last Vit D level was low at 24.1. She notes fatigue and she is on prescription Vit D.  2. Insulin resistance Carmen Wall feels metformin helps with appetite. She does note some stomach upset if she does not take with food.   Lab Results  Component Value Date   INSULIN 15.7 04/22/2020   INSULIN 14.9 11/21/2019   INSULIN 16.9 07/03/2019   Lab Results  Component Value Date   HGBA1C 5.2 04/22/2020   3. Other constipation Carmen Wall notes her BMs are once daily and very hard. She notes blood in her stool, but a small amount, bright red on the tissue.  4. Other depression with emotional eating  Carmen Wall's bupropion was increased at her last office visit. She feels it helps keep her mood stable.  5. At risk for side effect of medication Carmen Wall is at risk for drug side effects due to increased dose of  metformin.  Assessment/Plan:   1. Vitamin D deficiency  We will check labs today, and we will refill prescription Vitamin D for 1 month. Carmen Wall will follow-up for routine testing of Vitamin D, at least 2-3 times per year to avoid over-replacement.  - Vitamin D, Ergocalciferol, (DRISDOL) 1.25 MG (50000 UNIT) CAPS capsule; Take 1 capsule (50,000 Units total) by mouth every 7 (seven) days.  Dispense: 4 capsule; Refill: 0 - VITAMIN D 25 Hydroxy (Vit-D Deficiency, Fractures)  2. Insulin resistance  We will check labs today, and Carmen Wall agreed to increase metformin to 500 mg BID with food, with no refills. Carmen Wall agreed to follow-up with Carmen Wall as directed to closely monitor her progress.  - metFORMIN (GLUCOPHAGE) 500 MG tablet; Take 1 tablet (500 mg total) by mouth 2 (two) times daily with a meal.  Dispense: 60 tablet; Refill: 0 - Comprehensive metabolic panel - Hemoglobin A1c - Insulin, random - Lipid Panel With LDL/HDL Ratio  3. Other constipation  Carmen Wall agreed to take miralax every other day or daily as needed. She is to increase her water, fruit, and vegetable intake.   4. Other depression with emotional eating  . Carmen Wall will continue bupropion. Orders and follow up as documented in patient record.   5. At risk for side effect of medication Carmen Wall was given approximately 15 minutes of drug side effect counseling today.  We discussed side effect possibility and risk versus benefits. Carmen Wall agreed to the medication and will contact this office if these side  effects are intolerable.  Repetitive spaced learning was employed today to elicit superior memory formation and behavioral change.  6. Class 3 severe obesity with serious comorbidity and body mass index (BMI) of 40.0 to 44.9 in adult, unspecified obesity type (HCC) Carmen Wall is currently in the action stage of change. As such, her goal is to continue with weight loss efforts. She has agreed to practicing  portion control and making smarter food choices, such as increasing vegetables and decreasing simple carbohydrates.   We discussed better snacking choices such as fruit, popcorn, etc.  Exercise goals: No exercise has been prescribed at this time.  Behavioral modification strategies: increasing lean protein intake, decreasing simple carbohydrates and better snacking choices.  Carmen Wall has agreed to follow-up with our clinic in 3 weeks.  Objective:   Blood pressure 105/69, pulse 69, temperature 98.2 F (36.8 C), height 5\' 7"  (1.702 m), weight 283 lb (128.4 kg), SpO2 98 %. Body mass index is 44.32 kg/m.  General: Cooperative, alert, well developed, in no acute distress. HEENT: Conjunctivae and lids unremarkable. Cardiovascular: Regular rhythm.  Lungs: Normal work of breathing. Neurologic: No focal deficits.   Lab Results  Component Value Date   CREATININE 0.75 04/22/2020   BUN 11 04/22/2020   NA 139 04/22/2020   K 4.5 04/22/2020   CL 101 04/22/2020   CO2 26 04/22/2020   Lab Results  Component Value Date   ALT 16 04/22/2020   AST 13 04/22/2020   ALKPHOS 77 04/22/2020   BILITOT 0.5 04/22/2020   Lab Results  Component Value Date   HGBA1C 5.2 04/22/2020   HGBA1C 4.9 07/03/2019   HGBA1C 4.9 09/05/2017   Lab Results  Component Value Date   INSULIN 15.7 04/22/2020   INSULIN 14.9 11/21/2019   INSULIN 16.9 07/03/2019   Lab Results  Component Value Date   TSH 0.655 07/03/2019   Lab Results  Component Value Date   CHOL 218 (H) 04/22/2020   HDL 47 04/22/2020   LDLCALC 148 (H) 04/22/2020   TRIG 127 04/22/2020   CHOLHDL 4 08/24/2018   Lab Results  Component Value Date   WBC 4.7 02/07/2020   HGB 13.9 02/07/2020   HCT 41.5 02/07/2020   MCV 92 02/07/2020   PLT 197 02/07/2020   No results found for: IRON, TIBC, FERRITIN  Attestation Statements:   Reviewed by clinician on day of visit: allergies, medications, problem list, medical history, surgical history,  family history, social history, and previous encounter notes.   04/08/2020, am acting as Trude Mcburney for Energy manager, FNP-C.  I have reviewed the above documentation for accuracy and completeness, and I agree with the above. -  [place], FNP

## 2020-05-12 ENCOUNTER — Ambulatory Visit (INDEPENDENT_AMBULATORY_CARE_PROVIDER_SITE_OTHER): Payer: Managed Care, Other (non HMO) | Admitting: Family Medicine

## 2020-05-25 ENCOUNTER — Other Ambulatory Visit (INDEPENDENT_AMBULATORY_CARE_PROVIDER_SITE_OTHER): Payer: Self-pay | Admitting: Family Medicine

## 2020-05-25 DIAGNOSIS — F418 Other specified anxiety disorders: Secondary | ICD-10-CM

## 2020-05-25 NOTE — Telephone Encounter (Signed)
Refill request. Last appointment cancelled due to [address] [facility] was out of the office. Pt elected to not see another provider and stated she would call back for f/u. No f/u scheduled at this time.

## 2020-05-27 ENCOUNTER — Other Ambulatory Visit (INDEPENDENT_AMBULATORY_CARE_PROVIDER_SITE_OTHER): Payer: Self-pay | Admitting: Family Medicine

## 2020-05-27 DIAGNOSIS — F418 Other specified anxiety disorders: Secondary | ICD-10-CM

## 2020-06-02 ENCOUNTER — Telehealth: Payer: Self-pay | Admitting: Nurse Practitioner

## 2020-06-02 DIAGNOSIS — F331 Major depressive disorder, recurrent, moderate: Secondary | ICD-10-CM

## 2020-06-02 DIAGNOSIS — F418 Other specified anxiety disorders: Secondary | ICD-10-CM

## 2020-06-02 NOTE — Telephone Encounter (Signed)
Patient is scheduled for 01/07, but wants to know if she can get enough medication to last until her appointment. She needs her Trazodone, Wellbutrin, and Escitalopram. If approved, please send to [facility] and call patient at [phone] to let her know that it has been sent in.

## 2020-06-03 MED ORDER — BUPROPION HCL ER (XL) 300 MG PO TB24
300.0000 mg | ORAL_TABLET | Freq: Every day | ORAL | 0 refills | Status: DC
Start: 2020-06-03 — End: 2020-06-12

## 2020-06-03 MED ORDER — ESCITALOPRAM OXALATE 20 MG PO TABS: 20.0000 mg | ORAL_TABLET | Freq: Every day | ORAL | 0 refills | Status: DC

## 2020-06-03 MED ORDER — TRAZODONE HCL 50 MG PO TABS: 50.0000 mg | ORAL_TABLET | Freq: Every day | ORAL | 0 refills | Status: DC

## 2020-06-09 ENCOUNTER — Ambulatory Visit
Admission: EM | Admit: 2020-06-09 | Discharge: 2020-06-09 | Disposition: A | Discharge status: Home / Self Care | Payer: Managed Care, Other (non HMO) | Attending: Emergency Medicine | Admitting: Emergency Medicine

## 2020-06-09 ENCOUNTER — Encounter: Payer: Self-pay | Admitting: Emergency Medicine

## 2020-06-09 DIAGNOSIS — N390 Urinary tract infection, site not specified: Secondary | ICD-10-CM | POA: Insufficient documentation

## 2020-06-09 LAB — POCT URINALYSIS DIP (MANUAL ENTRY)
Bilirubin, UA: NEGATIVE
Blood, UA: NEGATIVE
Glucose, UA: NEGATIVE mg/dL
Ketones, POC UA: NEGATIVE mg/dL
Nitrite, UA: NEGATIVE
Protein Ur, POC: NEGATIVE mg/dL
Spec Grav, UA: 1.025 (ref 1.010–1.025)
Urobilinogen, UA: 0.2 E.U./dL
pH, UA: 7 (ref 5.0–8.0)

## 2020-06-09 MED ORDER — NITROFURANTOIN MONOHYD MACRO 100 MG PO CAPS: 100.0000 mg | ORAL_CAPSULE | Freq: Two times a day (BID) | ORAL | 0 refills | Status: DC

## 2020-06-09 NOTE — Discharge Instructions (Signed)
Urine showed evidence of infection. We are treating you with macrobid- twice daily for 5 days. Be sure to take full course. Stay hydrated- urine should be pale yellow to clear.  °Please return or follow up with your primary provider if symptoms not improving with treatment. Please return sooner if you have worsening of symptoms or develop fever, nausea, vomiting, abdominal pain, back pain, lightheadedness, dizziness. °

## 2020-06-09 NOTE — ED Provider Notes (Signed)
[facility]    CSN: [id number] Arrival date & time: 06/09/20  1014      History   Chief Complaint Chief Complaint  Patient presents with  . Dysuria    HPI Carmen Wall is a 30 y.o. female presenting today for evaluation of dysuria and urinary frequency.  Symptoms began yesterday.  Reports history of UTIs.  Denies any abnormal vaginal discharge itching or irritation.  HPI  Past Medical History:  Diagnosis Date  . ADHD   . Allergy   . Anxiety   . Anxiety   . Arthritis   . Back pain   . Bilateral swelling of feet   . Chronic fatigue syndrome   . Constipation   . Depression   . IBS (irritable bowel syndrome)   . Joint pain   . Lactose intolerance   . Migraine without aura     Patient Active Problem List   Diagnosis Date Noted  . Depression with anxiety 03/02/2020  . Other hyperlipidemia 12/12/2019  . 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 Vitamin D deficiency 11/25/2019  . Piriformis syndrome of right side 11/11/2019  . Cough variant asthma 09/26/2019  . Excessive daytime sleepiness 03/13/2019  . 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 Acute right ankle pain 10/24/2018  . Class 3 severe obesity with serious comorbidity and body mass index (BMI) of 40.0 to 44.9 in adult [facility]) 08/24/2018  . GAD (generalized anxiety disorder) 08/24/2018  . Acute pain of right knee 07/23/2018  . Chronic right-sided low back pain with right-sided sciatica 05/15/2018  . Moderate episode of recurrent major depressive disorder (HCC) 09/05/2017  . Migraine without aura   . Arthritis   . Allergy   . Increased bowel frequency 07/12/2016  . Increased urinary frequency 07/12/2016  . Rhinitis, allergic 09/09/2014    Past Surgical History:  Procedure Laterality Date  . INTRAUTERINE DEVICE (IUD) INSERTION  04/2017   Mirena   . KNEE ARTHROSCOPY Right     OB History    Gravida  1   Para  1   Term  1   Preterm      AB      Living  1     SAB      IAB       Ectopic      Multiple      Live Births  1            Home Medications    Prior to Admission medications   Medication Sig Start Date End Date Taking? Authorizing Provider  nitrofurantoin, macrocrystal-monohydrate, (MACROBID) 100 MG capsule Take 1 capsule (100 mg total) by mouth 2 (two) times daily for 5 days. 06/09/20 06/14/20 Yes Wieters, Hallie C, PA-C  Acetaminophen-Caffeine (EXCEDRIN TENSION HEADACHE PO) Take by mouth.    [provider]  albuterol (VENTOLIN HFA) 108 (90 Base) MCG/ACT inhaler Inhale 1 puff into the lungs every 6 (six) hours as needed for wheezing or shortness of breath. 09/26/19   [name], Bonna Gains, NP  buPROPion (WELLBUTRIN XL) 300 MG 24 hr tablet Take 1 tablet (300 mg total) by mouth daily. 06/03/20   Eulis Foster, FNP  busPIRone (BUSPAR) 15 MG tablet Take 1 tablet (15 mg total) by mouth 2 (two) times daily. 08/24/18   [name], Bonna Gains, NP  escitalopram (LEXAPRO) 20 MG tablet Take 1 tablet (20 mg total) by mouth at bedtime. 06/03/20   Eulis Foster, FNP  levonorgestrel (MIRENA) 20 MCG/24HR IUD 1 each by  Intrauterine route once.    [provider]  metFORMIN (GLUCOPHAGE) 500 MG tablet Take 1 tablet (500 mg total) by mouth 2 (two) times daily with a meal. 04/22/20   Whitmire, Joneen Boers, FNP  omeprazole (PRILOSEC) 20 MG capsule Take 1 capsule (20 mg total) by mouth 2 (two) times daily before a meal. 03/02/20   [name], Charlene Brooke, NP  tiZANidine (ZANAFLEX) 4 MG capsule Take 1 capsule (4 mg total) by mouth 3 (three) times daily as needed for muscle spasms. 02/13/19   [name], Charlene Brooke, NP  traZODone (DESYREL) 50 MG tablet Take 1 tablet (50 mg total) by mouth at bedtime. 06/03/20   Kennyth Arnold, FNP  Vitamin D, Ergocalciferol, (DRISDOL) 1.25 MG (50000 UNIT) CAPS capsule Take 1 capsule (50,000 Units total) by mouth every 7 (seven) days. 04/22/20   Whitmire, Joneen Boers, FNP    Family History Family History  Problem Relation Age of Onset  . Diabetes  Father   . Arthritis Father   . Depression Father   . Mental illness Father        PTSD, anxiety  . Hypertension Father   . Hyperlipidemia Father   . Anxiety disorder Father   . Bipolar disorder Father   . Sleep apnea Father   . Alcoholism Father   . Obesity Father   . Arthritis Mother        osteoartthritis  . Fibroids Mother   . Ovarian cysts Mother   . Obesity Mother   . Kidney disease Maternal Grandmother   . Arthritis Maternal Grandmother   . Prostate cancer Maternal Grandfather   . Arthritis Maternal Grandfather   . Arthritis Paternal Grandmother   . Diabetes Paternal Grandfather   . Mental illness Paternal Grandfather   . Arthritis Paternal Grandfather     Social History Social History   Tobacco Use  . Smoking status: Never Smoker  . Smokeless tobacco: Never Used  Vaping Use  . Vaping Use: Never used  Substance Use Topics  . Alcohol use: No    Alcohol/week: 0.0 standard drinks  . Drug use: No     Allergies   Patient has no known allergies.   Review of Systems Review of Systems  Constitutional: Negative for fever.  Respiratory: Negative for shortness of breath.   Cardiovascular: Negative for chest pain.  Gastrointestinal: Negative for abdominal pain, diarrhea, nausea and vomiting.  Genitourinary: Positive for dysuria and frequency. Negative for flank pain, genital sores, hematuria, menstrual problem, vaginal bleeding, vaginal discharge and vaginal pain.  Musculoskeletal: Negative for back pain.  Skin: Negative for rash.  Neurological: Negative for dizziness, light-headedness and headaches.     Physical Exam Triage Vital Signs ED Triage Vitals  Enc Vitals Group     BP 06/09/20 1246 118/66     Pulse Rate 06/09/20 1246 95     Resp 06/09/20 1246 18     Temp 06/09/20 1246 98.5 F (36.9 C)     Temp Source 06/09/20 1246 Oral     SpO2 06/09/20 1246 98 %     Weight --      Height --      Head Circumference --      Peak Flow --      Pain Score  06/09/20 1245 3     Pain Loc --      Pain Edu? --      Excl. in [place]? --    No data found.  Updated Vital Signs BP 118/66 (BP Location:  Left Arm)   Pulse 95   Temp 98.5 F (36.9 C) (Oral)   Resp 18   SpO2 98%   Visual Acuity Right Eye Distance:   Left Eye Distance:   Bilateral Distance:    Right Eye Near:   Left Eye Near:    Bilateral Near:     Physical Exam Vitals and nursing note reviewed.  Constitutional:      Appearance: She is well-developed and well-nourished.     Comments: No acute distress  HENT:     Head: Normocephalic and atraumatic.     Nose: Nose normal.  Eyes:     Conjunctiva/sclera: Conjunctivae normal.  Cardiovascular:     Rate and Rhythm: Normal rate.  Pulmonary:     Effort: Pulmonary effort is normal. No respiratory distress.  Abdominal:     General: There is no distension.  Musculoskeletal:        General: Normal range of motion.     Cervical back: Neck supple.  Skin:    General: Skin is warm and dry.  Neurological:     Mental Status: She is alert and oriented to person, place, and time.  Psychiatric:        Mood and Affect: Mood and affect normal.      UC Treatments / Results  Labs (all labs ordered are listed, but only abnormal results are displayed) Labs Reviewed  POCT URINALYSIS DIP (MANUAL ENTRY) - Abnormal; Notable for the following components:      Result Value   Leukocytes, UA Large (3+) (*)    All other components within normal limits  URINE CULTURE    EKG   Radiology No results found.  Procedures Procedures (including critical care time)  Medications Ordered in UC Medications - No data to display  Initial Impression / Assessment and Plan / UC Course  I have reviewed the triage vital signs and the nursing notes.  Pertinent labs & imaging results that were available during my care of the patient were reviewed by me and considered in my medical decision making (see chart for details).     Large leuks, urine  culture pending.  Treating with Macrobid twice daily x5 days.  Push fluids.  Discussed strict return precautions. Patient verbalized understanding and is agreeable with plan.  Final Clinical Impressions(s) / UC Diagnoses   Final diagnoses:  Lower urinary tract infection, acute     Discharge Instructions     Urine showed evidence of infection. We are treating you with macrobid- twice daily for 5 days. Be sure to take full course. Stay hydrated- urine should be pale yellow to clear.   Please return or follow up with your primary provider if symptoms not improving with treatment. Please return sooner if you have worsening of symptoms or develop fever, nausea, vomiting, abdominal pain, back pain, lightheadedness, dizziness.     ED Prescriptions    Medication Sig Dispense Auth. Provider   nitrofurantoin, macrocrystal-monohydrate, (MACROBID) 100 MG capsule Take 1 capsule (100 mg total) by mouth 2 (two) times daily for 5 days. 10 capsule Wieters, Salem C, PA-C     PDMP not reviewed this encounter.   Lew Dawes, PA-C 06/09/20 1356

## 2020-06-09 NOTE — ED Triage Notes (Signed)
Dysuria and frequency since yesterday.  

## 2020-06-10 LAB — URINE CULTURE: Culture: 10000 — AB

## 2020-06-12 ENCOUNTER — Encounter: Payer: Self-pay | Admitting: Nurse Practitioner

## 2020-06-12 ENCOUNTER — Ambulatory Visit: Payer: Managed Care, Other (non HMO) | Admitting: Nurse Practitioner

## 2020-06-12 ENCOUNTER — Other Ambulatory Visit: Payer: Self-pay

## 2020-06-12 VITALS — BP 118/80 | HR 80 | Temp 98.2°F | Ht 67.0 in | Wt 295.0 lb

## 2020-06-12 DIAGNOSIS — F331 Major depressive disorder, recurrent, moderate: Secondary | ICD-10-CM | POA: Diagnosis not present

## 2020-06-12 DIAGNOSIS — F411 Generalized anxiety disorder: Secondary | ICD-10-CM | POA: Diagnosis not present

## 2020-06-12 DIAGNOSIS — F418 Other specified anxiety disorders: Secondary | ICD-10-CM

## 2020-06-12 DIAGNOSIS — Z23 Encounter for immunization: Secondary | ICD-10-CM

## 2020-06-12 DIAGNOSIS — G43009 Migraine without aura, not intractable, without status migrainosus: Secondary | ICD-10-CM | POA: Diagnosis not present

## 2020-06-12 MED ORDER — BUPROPION HCL ER (XL) 300 MG PO TB24
300.0000 mg | ORAL_TABLET | Freq: Every day | ORAL | 3 refills | Status: DC
Start: 2020-06-12 — End: 2020-10-14

## 2020-06-12 MED ORDER — RIZATRIPTAN BENZOATE 5 MG PO TABS: 5.0000 mg | ORAL_TABLET | ORAL | 0 refills | Status: DC | PRN

## 2020-06-12 MED ORDER — ESCITALOPRAM OXALATE 20 MG PO TABS: 20.0000 mg | ORAL_TABLET | Freq: Every day | ORAL | 3 refills | Status: DC

## 2020-06-12 MED ORDER — TRAZODONE HCL 50 MG PO TABS
50.0000 mg | ORAL_TABLET | Freq: Every day | ORAL | 3 refills | Status: DC
Start: 2020-06-12 — End: 2020-08-26

## 2020-06-12 NOTE — Assessment & Plan Note (Signed)
Stable mood Denies need for therapy session and change in dose

## 2020-06-12 NOTE — Patient Instructions (Signed)
Contact neurology for sleep study as previously discussed.  Maintain current medications  Use maxalt prn for migraine headache

## 2020-06-12 NOTE — Assessment & Plan Note (Addendum)
Chronic, stable, mild improvement with excedrin Right occipital region, associated with right blurry vision and light sensitivity.  Stop excedrin use maxalt prn

## 2020-06-12 NOTE — Progress Notes (Signed)
Subjective:  Patient ID: Carmen Wall [place], female    DOB: September 26, 1990  Age: 30 y.o. MRN: [id number]  CC: Follow-up (Medication refills needed (lexapro, wellbutrin, trazodone). Pt would also like to discuss getting back on medication for severe headaches. )  HPI  Migraine without aura Chronic, stable, mild improvement with excedrin Right occipital region, associated with right blurry vision and light sensitivity.  Stop excedrin use maxalt prn If no improvement, d/c wellbutrin and buspar, start topamax?  Wt Readings from Last 3 Encounters:  06/12/20 295 lb (133.8 kg)  04/22/20 283 lb (128.4 kg)  03/31/20 281 lb (127.5 kg)   Depression screen [facility] 2/9 06/12/2020 07/03/2019 03/13/2019  Decreased Interest 1 2 0  Down, Depressed, Hopeless 0 3 0  PHQ - 2 Score 1 5 0  Altered sleeping 3 2 2   Tired, decreased energy 3 2 2   Change in appetite 2 3 2   Feeling bad or failure about yourself  0 2 0  Trouble concentrating 2 1 0  Moving slowly or fidgety/restless 2 0 1  Suicidal thoughts 0 0 0  PHQ-9 Score 13 15 7   Difficult doing work/chores Somewhat difficult Extremely dIfficult -   GAD 7 : Generalized Anxiety Score 06/12/2020 03/13/2019 08/24/2018 05/15/2018  Nervous, Anxious, on Edge 1 1 1 3   Control/stop worrying 0 1 0 3  Worry too much - different things 1 1 0 3  Trouble relaxing 0 0 1 2  Restless 2 0 0 2  Easily annoyed or irritable 1 1 1 3   Afraid - awful might happen 0 1 0 2  Total GAD 7 Score 5 5 3 18   Anxiety Difficulty Somewhat difficult - - -    Reviewed past Medical, Social and Family history today.  Outpatient Medications Prior to Visit  Medication Sig Dispense Refill  . albuterol (VENTOLIN HFA) 108 (90 Base) MCG/ACT inhaler Inhale 1 puff into the lungs every 6 (six) hours as needed for wheezing or shortness of breath. 6.7 g 1  . busPIRone (BUSPAR) 15 MG tablet Take 1 tablet (15 mg total) by mouth 2 (two) times daily. 180 tablet 3  . levonorgestrel (MIRENA) 20 MCG/24HR  IUD 1 each by Intrauterine route once.    . metFORMIN (GLUCOPHAGE) 500 MG tablet Take 1 tablet (500 mg total) by mouth 2 (two) times daily with a meal. 60 tablet 0  . omeprazole (PRILOSEC) 20 MG capsule Take 1 capsule (20 mg total) by mouth 2 (two) times daily before a meal. 30 capsule 0  . tiZANidine (ZANAFLEX) 4 MG capsule Take 1 capsule (4 mg total) by mouth 3 (three) times daily as needed for muscle spasms. 14 capsule 0  . Vitamin D, Ergocalciferol, (DRISDOL) 1.25 MG (50000 UNIT) CAPS capsule Take 1 capsule (50,000 Units total) by mouth every 7 (seven) days. 4 capsule 0  . Acetaminophen-Caffeine (EXCEDRIN TENSION HEADACHE PO) Take by mouth.    08/10/2020 buPROPion (WELLBUTRIN XL) 300 MG 24 hr tablet Take 1 tablet (300 mg total) by mouth daily. 30 tablet 0  . escitalopram (LEXAPRO) 20 MG tablet Take 1 tablet (20 mg total) by mouth at bedtime. 30 tablet 0  . nitrofurantoin, macrocrystal-monohydrate, (MACROBID) 100 MG capsule Take 1 capsule (100 mg total) by mouth 2 (two) times daily for 5 days. 10 capsule 0  . traZODone (DESYREL) 50 MG tablet Take 1 tablet (50 mg total) by mouth at bedtime. 30 tablet 0   No facility-administered medications prior to visit.    ROS See HPI  Objective:  BP 118/80 (BP Location: Left Arm, Patient Position: Sitting, Cuff Size: Large)   Pulse 80   Temp 98.2 F (36.8 C) (Temporal)   Ht 5\' 7"  (1.702 Wall)   Wt 295 lb (133.8 kg)   SpO2 97%   BMI 46.20 kg/Wall   Physical Exam Vitals reviewed.  Constitutional:      Appearance: She is obese.  Cardiovascular:     Rate and Rhythm: Normal rate.     Pulses: Normal pulses.  Pulmonary:     Effort: Pulmonary effort is normal.  Neurological:     Mental Status: She is alert and oriented to person, place, and time.  Psychiatric:        Mood and Affect: Mood normal.        Behavior: Behavior normal.        Thought Content: Thought content normal.    Assessment & Plan:  This visit occurred during the SARS-CoV-2 public health  emergency.  Safety protocols were in place, including screening questions prior to the visit, additional usage of staff PPE, and extensive cleaning of exam room while observing appropriate contact time as indicated for disinfecting solutions.   Amayah was seen today for follow-up.  Diagnoses and all orders for this visit:  GAD (generalized anxiety disorder)  Influenza vaccine needed -     Flu Vaccine QUAD 6+ mos PF IM (Fluarix Quad PF)  Moderate episode of recurrent major depressive disorder (HCC) -     traZODone (DESYREL) 50 MG tablet; Take 1 tablet (50 mg total) by mouth at bedtime.  Migraine without aura and without status migrainosus, not intractable -     rizatriptan (MAXALT) 5 MG tablet; Take 1-2 tablets (5-10 mg total) by mouth as needed for migraine. May repeat in 2 hours if needed, no more than 30mg  in 24hrs  Depression with anxiety -     buPROPion (WELLBUTRIN XL) 300 MG 24 hr tablet; Take 1 tablet (300 mg total) by mouth daily. -     escitalopram (LEXAPRO) 20 MG tablet; Take 1 tablet (20 mg total) by mouth at bedtime.    Problem List Items Addressed This Visit      Cardiovascular and Mediastinum   Migraine without aura    Chronic, stable, mild improvement with excedrin Right occipital region, associated with right blurry vision and light sensitivity.  Stop excedrin use maxalt prn If no improvement, d/c wellbutrin and buspar, start topamax?      Relevant Medications   buPROPion (WELLBUTRIN XL) 300 MG 24 hr tablet   escitalopram (LEXAPRO) 20 MG tablet   traZODone (DESYREL) 50 MG tablet   rizatriptan (MAXALT) 5 MG tablet     Other   Depression with anxiety   Relevant Medications   buPROPion (WELLBUTRIN XL) 300 MG 24 hr tablet   escitalopram (LEXAPRO) 20 MG tablet   traZODone (DESYREL) 50 MG tablet   GAD (generalized anxiety disorder) - Primary   Relevant Medications   buPROPion (WELLBUTRIN XL) 300 MG 24 hr tablet   escitalopram (LEXAPRO) 20 MG tablet    traZODone (DESYREL) 50 MG tablet   Moderate episode of recurrent major depressive disorder (HCC)   Relevant Medications   buPROPion (WELLBUTRIN XL) 300 MG 24 hr tablet   escitalopram (LEXAPRO) 20 MG tablet   traZODone (DESYREL) 50 MG tablet    Other Visit Diagnoses    Influenza vaccine needed       Relevant Orders   Flu Vaccine QUAD 6+ mos PF IM (Fluarix Quad PF) (  Completed)      Follow-up: Return in about 3 months (around 09/10/2020) for CPE (fasting).  Alysia Penna, NP

## 2020-06-19 ENCOUNTER — Telehealth (INDEPENDENT_AMBULATORY_CARE_PROVIDER_SITE_OTHER): Payer: Managed Care, Other (non HMO) | Admitting: Family Medicine

## 2020-06-19 DIAGNOSIS — R42 Dizziness and giddiness: Secondary | ICD-10-CM

## 2020-06-19 DIAGNOSIS — R0981 Nasal congestion: Secondary | ICD-10-CM | POA: Diagnosis not present

## 2020-06-19 DIAGNOSIS — R059 Cough, unspecified: Secondary | ICD-10-CM | POA: Diagnosis not present

## 2020-06-19 MED ORDER — BENZONATATE 100 MG PO CAPS: 100.0000 mg | ORAL_CAPSULE | Freq: Three times a day (TID) | ORAL | 0 refills | Status: DC | PRN

## 2020-06-19 NOTE — Progress Notes (Signed)
Virtual Visit via Video Note  I connected with Carmen Wall  on 06/19/20 at  2:40 PM EST by a video enabled telemedicine application and verified that I am speaking with the correct person using two identifiers.  Location patient: home, [place] Location provider:work or home office Persons participating in the virtual visit: patient, provider  I discussed the limitations of evaluation and management by telemedicine and the availability of in person appointments. The patient expressed understanding and agreed to proceed.   HPI:  Acute telemedicine visit for sinus issues: -Onset: 2-3 days ago -did a covid test the day these symptoms started - waiting on results -fiance is sick with similar symptoms -Symptoms include: sinus congestion, PND, cough, some vertigo today, headache yesterday, body aches, feels tired, some diarrhea -Denies: fever, CP, SOB, vomiting, inability to ear/drink/get out bed -Has tried: migraine medicine yesterday -Pertinent past medical history:migraine -Pertinent medication allergies:nkda -COVID-19 vaccine status: vaccinated for covid, did not have the booster; had flu shot -denies any chance of pregnancy, has IUD ROS: See pertinent positives and negatives per HPI.  Past Medical History:  Diagnosis Date  . ADHD   . Allergy   . Anxiety   . Anxiety   . Arthritis   . Back pain   . Bilateral swelling of feet   . Chronic fatigue syndrome   . Constipation   . Depression   . IBS (irritable bowel syndrome)   . Joint pain   . Lactose intolerance   . Migraine without aura     Past Surgical History:  Procedure Laterality Date  . INTRAUTERINE DEVICE (IUD) INSERTION  04/2017   Mirena   . KNEE ARTHROSCOPY Right      Current Outpatient Medications:  .  benzonatate (TESSALON PERLES) 100 MG capsule, Take 1 capsule (100 mg total) by mouth 3 (three) times daily as needed., Disp: 20 capsule, Rfl: 0 .  albuterol (VENTOLIN HFA) 108 (90 Base) MCG/ACT inhaler, Inhale 1 puff into  the lungs every 6 (six) hours as needed for wheezing or shortness of breath., Disp: 6.7 g, Rfl: 1 .  buPROPion (WELLBUTRIN XL) 300 MG 24 hr tablet, Take 1 tablet (300 mg total) by mouth daily., Disp: 90 tablet, Rfl: 3 .  busPIRone (BUSPAR) 15 MG tablet, Take 1 tablet (15 mg total) by mouth 2 (two) times daily., Disp: 180 tablet, Rfl: 3 .  escitalopram (LEXAPRO) 20 MG tablet, Take 1 tablet (20 mg total) by mouth at bedtime., Disp: 90 tablet, Rfl: 3 .  levonorgestrel (MIRENA) 20 MCG/24HR IUD, 1 each by Intrauterine route once., Disp: , Rfl:  .  metFORMIN (GLUCOPHAGE) 500 MG tablet, Take 1 tablet (500 mg total) by mouth 2 (two) times daily with a meal., Disp: 60 tablet, Rfl: 0 .  omeprazole (PRILOSEC) 20 MG capsule, Take 1 capsule (20 mg total) by mouth 2 (two) times daily before a meal., Disp: 30 capsule, Rfl: 0 .  rizatriptan (MAXALT) 5 MG tablet, Take 1-2 tablets (5-10 mg total) by mouth as needed for migraine. May repeat in 2 hours if needed, no more than 30mg  in 24hrs, Disp: 10 tablet, Rfl: 0 .  tiZANidine (ZANAFLEX) 4 MG capsule, Take 1 capsule (4 mg total) by mouth 3 (three) times daily as needed for muscle spasms., Disp: 14 capsule, Rfl: 0 .  traZODone (DESYREL) 50 MG tablet, Take 1 tablet (50 mg total) by mouth at bedtime., Disp: 90 tablet, Rfl: 3 .  Vitamin D, Ergocalciferol, (DRISDOL) 1.25 MG (50000 UNIT) CAPS capsule, Take 1 capsule (50,000  Units total) by mouth every 7 (seven) days., Disp: 4 capsule, Rfl: 0  EXAM:  VITALS per patient if applicable:  GENERAL: alert, oriented, appears well and in no acute distress  HEENT: atraumatic, conjunttiva clear, no obvious abnormalities on inspection of external nose and ears  NECK: normal movements of the head and neck  LUNGS: on inspection no signs of respiratory distress, breathing rate appears normal, no obvious gross SOB, gasping or wheezing  CV: no obvious cyanosis  MS: moves all visible extremities without noticeable  abnormality  PSYCH/NEURO: pleasant and cooperative, no obvious depression or anxiety, speech and thought processing grossly intact  ASSESSMENT AND PLAN:  Discussed the following assessment and plan:  Nasal congestion  Cough  Vertigo  -we discussed possible serious and likely etiologies, options for evaluation and workup, limitations of telemedicine visit vs in person visit, treatment, treatment risks and precautions. Pt prefers to treat via telemedicine empirically rather than in person at this moment. Query VURI, COVID19 vs other. Also discussed possibility of mild flu. Opted for symptomatic care, tessalon for cough. Covid testing results pending. Also discussed other causes of vertigo. Advised not to drive with vertigo.  Work/School slipped offered: provided in patient instructions  Scheduled follow up with PCP offered:agrees to schedule follow up if needed. Advised to seek prompt in person care if worsening, new symptoms arise, or if is not improving with treatment. Discussed options for inperson care if PCP office not available. Did let this patient know that I only do telemedicine on Tuesdays and Thursdays for [place]. Advised to schedule follow up visit with PCP or [facility] if any further questions or concerns to avoid delays in care.   I discussed the assessment and treatment plan with the patient. The patient was provided an opportunity to ask questions and all were answered. The patient agreed with the plan and demonstrated an understanding of the instructions.     Terressa Koyanagi, DO

## 2020-06-19 NOTE — Patient Instructions (Addendum)
   ---------------------------------------------------------------------------------------------------------------------------      WORK SLIP:  Patient Carmen Wall,  26-Apr-1991, was seen for a medical visit today, 06/19/20 . Please excuse from work for a COVID like illness. We advise 10 days minimum from the onset of symptoms (06/17/20) PLUS 1 day of no fever and improved symptoms. Will defer to employer for a sooner return to work if symptoms have resolved, it is greater than 5 days since the positive test and the patient can wear a high-quality, tight fitting mask such as N95 or KN95 at all times for an additional 5 days. Would also suggest COVID19 antigen testing is negative prior to return.  Sincerely: E-signature: Dr. Kriste Basque, DO [facility] Ph: [phone]   ------------------------------------------------------------------------------------------------------------------------------   HOME CARE TIPS:  [name] COVID19 testing information: [email or web address] [place] [phone] Most pharmacies also offer testing and home test kits.  -I sent the medication(s) we discussed to your pharmacy: Meds ordered this encounter  Medications  . benzonatate (TESSALON PERLES) 100 MG capsule    Sig: Take 1 capsule (100 mg total) by mouth 3 (three) times daily as needed.    Dispense:  20 capsule    Refill:  0      -can use tylenol or aleve if needed for fevers, aches and pains per instructions  -can use nasal saline a few times per day if nasal congestion, sometime a short course of Afrin nasal spray for 3 days can help as well  -stay hydrated, drink plenty of fluids and eat small healthy meals - avoid dairy  -can take 1000 IU Vit D3 and 500mg  Vit C lozenges per instructions  -If the Covid test is positive, check out the CDC website for more information on home care, transmission and treatment for  COVID19  -follow up with your doctor in 2-3 days unless improving and feeling better  -stay home while sick, except to seek medical care, and if you have COVID19 please stay home for a full 10 days since the onset of symptoms PLUS one day of no fever and feeling better.  It was nice to meet you today, and I really hope you are feeling better soon. I help [facility] out with telemedicine visits on Tuesdays and Thursdays and am available for visits on those days. If you have any concerns or questions following this visit please schedule a follow up visit with your Primary Care doctor or seek care at a local urgent care clinic to avoid delays in care.    Seek in person care promptly if your symptoms worsen, new concerns arise or you are not improving with treatment. Call 911 and/or seek emergency care if you symptoms are severe or life threatening.

## 2020-07-29 ENCOUNTER — Other Ambulatory Visit: Payer: Self-pay

## 2020-07-29 ENCOUNTER — Encounter: Payer: Self-pay | Admitting: Nurse Practitioner

## 2020-07-29 ENCOUNTER — Ambulatory Visit (INDEPENDENT_AMBULATORY_CARE_PROVIDER_SITE_OTHER): Payer: Managed Care, Other (non HMO) | Admitting: Nurse Practitioner

## 2020-07-29 VITALS — BP 116/80 | HR 80 | Temp 97.1°F | Ht 67.0 in | Wt 295.0 lb

## 2020-07-29 DIAGNOSIS — M791 Myalgia, unspecified site: Secondary | ICD-10-CM | POA: Diagnosis not present

## 2020-07-29 DIAGNOSIS — F324 Major depressive disorder, single episode, in partial remission: Secondary | ICD-10-CM | POA: Diagnosis not present

## 2020-07-29 DIAGNOSIS — G4719 Other hypersomnia: Secondary | ICD-10-CM | POA: Diagnosis not present

## 2020-07-29 MED ORDER — GABAPENTIN 100 MG PO CAPS: 100.0000 mg | ORAL_CAPSULE | Freq: Every day | ORAL | 5 refills | Status: DC

## 2020-07-29 NOTE — Assessment & Plan Note (Addendum)
Needs new referral in order to schedule sleep study. Trazodone is no longer effective.

## 2020-07-29 NOTE — Progress Notes (Signed)
Subjective:  Patient ID: Carmen Wall [place], female    DOB: 1991-05-19  Age: 30 y.o. MRN: [id number]  CC: Acute Visit (Pt c/o constant body aches x2-3 months. Pt states at times the body aches or so bad she is unable to get out of the bed. )  Accompanied by fiancee.  Muscle Pain This is a chronic problem. The current episode started more than 1 year ago (worse in last 3-19months). The problem occurs constantly. The problem has been waxing and waning since onset. The context of the pain is unknown (worse at bedtime). Pain location: multiple locations. The pain is severe. The symptoms are aggravated by inactivity (at night). Associated symptoms include fatigue and headaches. Pertinent negatives include no abdominal pain, chest pain, constipation, diarrhea, fever, joint swelling, rash, stiffness, sensory change, shortness of breath, swollen glands, urinary symptoms, vaginal discharge, visual change, vomiting, weakness or wheezing. (No joint stiffness) Past treatments include acetaminophen, OTC NSAID and prescription NSAID. The treatment provided mild relief. There is no swelling present. She has been sleeping more. Her past medical history is significant for chronic back pain. There is no history of rheumatic disease or sickle cell disease.   Excessive daytime sleepiness Needs new referral in order to schedule sleep study. Trazodone is no longer effective.  Depression, major, single episode, in partial remission (HCC) Stable mood with wellbutrin and lexapro. Denies need for psychology referral  Wt Readings from Last 3 Encounters:  07/29/20 295 lb (133.8 kg)  06/12/20 295 lb (133.8 kg)  04/22/20 283 lb (128.4 kg)   Reviewed past Medical, Social and Family history today.  Outpatient Medications Prior to Visit  Medication Sig Dispense Refill  . albuterol (VENTOLIN HFA) 108 (90 Base) MCG/ACT inhaler Inhale 1 puff into the lungs every 6 (six) hours as needed for wheezing or shortness of breath.  6.7 g 1  . buPROPion (WELLBUTRIN XL) 300 MG 24 hr tablet Take 1 tablet (300 mg total) by mouth daily. 90 tablet 3  . escitalopram (LEXAPRO) 20 MG tablet Take 1 tablet (20 mg total) by mouth at bedtime. 90 tablet 3  . levonorgestrel (MIRENA) 20 MCG/24HR IUD 1 each by Intrauterine route once.    . rizatriptan (MAXALT) 5 MG tablet Take 1-2 tablets (5-10 mg total) by mouth as needed for migraine. May repeat in 2 hours if needed, no more than 30mg  in 24hrs 10 tablet 0  . traZODone (DESYREL) 50 MG tablet Take 1 tablet (50 mg total) by mouth at bedtime. 90 tablet 3  . Vitamin D, Ergocalciferol, (DRISDOL) 1.25 MG (50000 UNIT) CAPS capsule Take 1 capsule (50,000 Units total) by mouth every 7 (seven) days. 4 capsule 0  . benzonatate (TESSALON PERLES) 100 MG capsule Take 1 capsule (100 mg total) by mouth 3 (three) times daily as needed. 20 capsule 0  . busPIRone (BUSPAR) 15 MG tablet Take 1 tablet (15 mg total) by mouth 2 (two) times daily. 180 tablet 3  . metFORMIN (GLUCOPHAGE) 500 MG tablet Take 1 tablet (500 mg total) by mouth 2 (two) times daily with a meal. 60 tablet 0  . omeprazole (PRILOSEC) 20 MG capsule Take 1 capsule (20 mg total) by mouth 2 (two) times daily before a meal. 30 capsule 0  . tiZANidine (ZANAFLEX) 4 MG capsule Take 1 capsule (4 mg total) by mouth 3 (three) times daily as needed for muscle spasms. (Patient not taking: Reported on 07/29/2020) 14 capsule 0   No facility-administered medications prior to visit.    ROS See  HPI  Objective:  BP 116/80 (BP Location: Right Arm, Patient Position: Sitting, Cuff Size: Large)   Pulse 80   Temp (!) 97.1 F (36.2 C) (Temporal)   Ht 5\' 7"  (1.702 Wall)   Wt 295 lb (133.8 kg)   SpO2 98%   BMI 46.20 kg/Wall   Physical Exam Constitutional:      Appearance: She is obese.  Cardiovascular:     Rate and Rhythm: Normal rate.     Pulses: Normal pulses.  Pulmonary:     Effort: Pulmonary effort is normal.  Musculoskeletal:        General: No  swelling or tenderness. Normal range of motion.     Right lower leg: No edema.     Left lower leg: No edema.  Neurological:     Mental Status: She is alert and oriented to person, place, and time.  Psychiatric:        Mood and Affect: Mood normal.        Behavior: Behavior normal.        Thought Content: Thought content normal.    Assessment & Plan:  This visit occurred during the SARS-CoV-2 public health emergency.  Safety protocols were in place, including screening questions prior to the visit, additional usage of staff PPE, and extensive cleaning of exam room while observing appropriate contact time as indicated for disinfecting solutions.   Carmen Wall was seen today for acute visit.  Diagnoses and all orders for this visit:  Myalgia -     gabapentin (NEURONTIN) 100 MG capsule; Take 1-2 capsules (100-200 mg total) by mouth at bedtime.  Depression, major, single episode, in partial remission (HCC)  Excessive daytime sleepiness -     Ambulatory referral to Neurology  Advised about possible correlation between myalgia and restless leg to lack of restful sleep and high BMI. Advised to try massage therapy, yoga for exercise and low carb diet. Will try gabapentin at hs, if no improvement in 2weeks, will consider changing lexapro to cymbalta Stop trazodone while take gabapentin.  Problem List Items Addressed This Visit      Other   Depression, major, single episode, in partial remission (HCC)    Stable mood with wellbutrin and lexapro. Denies need for psychology referral      Excessive daytime sleepiness    Needs new referral in order to schedule sleep study. Trazodone is no longer effective.      Relevant Orders   Ambulatory referral to Neurology    Other Visit Diagnoses    Myalgia    -  Primary   Relevant Medications   gabapentin (NEURONTIN) 100 MG capsule      Follow-up: Return in about 6 months (around 01/26/2021) for CPE (fasting).  01/28/2021, NP

## 2020-07-29 NOTE — Assessment & Plan Note (Signed)
Stable mood with wellbutrin and lexapro. Denies need for psychology referral

## 2020-07-29 NOTE — Patient Instructions (Addendum)
You will gabapentin 48hrs prior to sleep study. Stop trazodone while take gabapentin. Start gabapentin at bedtime. If no improvement in 2weeks, call office Start yoga exercise daily.

## 2020-08-26 ENCOUNTER — Encounter: Payer: Self-pay | Admitting: Neurology

## 2020-08-26 ENCOUNTER — Ambulatory Visit: Payer: Managed Care, Other (non HMO) | Admitting: Neurology

## 2020-08-26 VITALS — BP 124/84 | HR 90 | Ht 67.0 in | Wt 287.0 lb

## 2020-08-26 DIAGNOSIS — R0683 Snoring: Secondary | ICD-10-CM | POA: Diagnosis not present

## 2020-08-26 DIAGNOSIS — Z820 Family history of epilepsy and other diseases of the nervous system: Secondary | ICD-10-CM | POA: Diagnosis not present

## 2020-08-26 DIAGNOSIS — Z9189 Other specified personal risk factors, not elsewhere classified: Secondary | ICD-10-CM

## 2020-08-26 DIAGNOSIS — R400 Somnolence: Secondary | ICD-10-CM

## 2020-08-26 DIAGNOSIS — G2581 Restless legs syndrome: Secondary | ICD-10-CM

## 2020-08-26 DIAGNOSIS — G4761 Periodic limb movement disorder: Secondary | ICD-10-CM

## 2020-08-26 NOTE — Progress Notes (Signed)
Subjective:    Patient ID: Carmen Wall is a 30 y.o. female.  HPI     Interim history:  Carmen Wall is a 29 year old right-handed woman with an underlying medical of migraines, anxiety, depression, arthritis, allergies and morbid obesity with a BMI of over 35, who presents for reevaluation of her sleep disturbance.  She has had ongoing issues with daytime somnolence.  Lately, she has had more aching affecting the entire body.  She has been seeing her primary care nurse practitioner.  She has not seen a rheumatologist.  She has not had any work-up for arthritis as such.  She has had blood work, including thyroid function test, vitamin B12, this was on the lower end in early 2021.  She has been on prescription vitamin D.  Today, 08/26/2020: She reports that she worries about coming off of her antidepressant medication, Lexapro, even if she skips a day or so, she can feel it.  She is certainly not a heavy snorer but has a family history of sleep apnea.  She does snore been congested and lately she has had some flareup in her allergy symptoms.  She takes an over-the-counter allergy medicine and nasal spray.  She also has an inhaler in case she has breathing issues with her allergies, she is not actually labeled as asthmatic.  She tries to go to bed around 10 and rise time is around 6.  She is very restless at night, tosses and turns.  She has some restless leg symptoms and sometimes twitches or kicks her legs.  She lives with her fianc.  She has had some medication changes since we first met.  She is currently on Wellbutrin, Lexapro, gabapentin 100 to 200 mg at bedtime and no longer on trazodone.  She is not on Topamax or BuSpar or Zanaflex.  She would be interested in pursuing a sleep study without extended testing, as a daytime sleep study/nap study would require tapering her psychotropic medications.  Previously:   03/18/19: (She) reports being sleepy for years, since high school years. I  reviewed your office note from 03/13/2019.  Her Epworth sleepiness score is 19 out of 24, fatigue severity score is 59 out of 63. Of note, she is currently on Lexapro and trazodone, has been on these medicines here.  She takes trazodone at night for sleep.  She also takes BuSpar 15 mg up to twice daily as needed for anxiety.  She has a history of migraines and takes Topamax twice daily and also has a prescription for Zanaflex.  Her father has sleep apnea and uses a CPAP machine.  She sleeps anywhere from 7 to 10 hours.  She does not wake up rested.  She can take a nap despite sleeping up to 10 hours.  Bedtime is generally between 930 and 10 and rise time around 6.  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  She denies any recurrent morning headaches or night to night nocturia.  She lives with her 44-year-old daughter, they have a dog in the household who sleeps in her bedroom.  Daughter sometimes sleeps with her but generally sleeps in her own bedroom.  The patient does not watch TV in the bedroom typically although she does have a TV in her bedroom.  She is trying to reduce her caffeine, does not drink coffee or soda currently for the past 2 weeks.  Drinks about 1-2 servings of unsweetened tea per day.  She drinks alcohol rarely on special occasions  typically, she is a non-smoker.  She endorses vivid dreams and sleep starts, no telltale hypnagogic or hypnopompic hallucinations, no telltale symptoms of restless legs although she is a restless sleeper and tends to toss and turn a lot, no leg movements or kicking reported per her boyfriend.  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  The patient's allergies, current medications, family history, past medical history, past social history, past surgical history and problem list were reviewed and updated as appropriate.    Her Past Medical History Is Significant For: Past Medical History:  Diagnosis Date  . ADHD   . Allergy   . Anxiety    . Anxiety   . Arthritis   . Back pain   . Bilateral swelling of feet   . Chronic fatigue syndrome   . Constipation   . Depression   . IBS (irritable bowel syndrome)   . Joint pain   . Lactose intolerance   . Migraine without aura     Her Past Surgical History Is Significant For: Past Surgical History:  Procedure Laterality Date  . INTRAUTERINE DEVICE (IUD) INSERTION  04/2017   Mirena   . KNEE ARTHROSCOPY Right     Her Family History Is Significant For: Family History  Problem Relation Age of Onset  . Diabetes Father   . Arthritis Father   . Depression Father   . Mental illness Father        PTSD, anxiety  . Hypertension Father   . Hyperlipidemia Father   . Anxiety disorder Father   . Bipolar disorder Father   . Sleep apnea Father   . Alcoholism Father   . Obesity Father   . Arthritis Mother        osteoartthritis  . Fibroids Mother   . Ovarian cysts Mother   . Obesity Mother   . Kidney disease Maternal Grandmother   . Arthritis Maternal Grandmother   . Prostate cancer Maternal Grandfather   . Arthritis Maternal Grandfather   . Arthritis Paternal Grandmother   . Diabetes Paternal Grandfather   . Mental illness Paternal Grandfather   . Arthritis Paternal Grandfather     Her Social History Is Significant For: Social History   Socioeconomic History  . Marital status: Legally Separated    Spouse name: Not on file  . Number of children: Not on file  . Years of education: Not on file  . Highest education level: Not on file  Occupational History  . Not on file  Tobacco Use  . Smoking status: Never Smoker  . Smokeless tobacco: Never Used  Vaping Use  . Vaping Use: Never used  Substance and Sexual Activity  . Alcohol use: No    Alcohol/week: 0.0 standard drinks  . Drug use: No  . Sexual activity: Yes    Partners: Male    Birth control/protection: I.U.D.  Other Topics Concern  . Not on file  Social History Narrative  . Not on file   Social  Determinants of Health   Financial Resource Strain: Not on file  Food Insecurity: Not on file  Transportation Needs: Not on file  Physical Activity: Not on file  Stress: Not on file  Social Connections: Not on file    Her Allergies Are:  No Known Allergies:   Her Current Medications Are:  Outpatient Encounter Medications as of 08/26/2020  Medication Sig  . buPROPion (WELLBUTRIN XL) 300 MG 24 hr tablet Take 1 tablet (300 mg total) by mouth daily.  Marland Kitchen escitalopram (  LEXAPRO) 20 MG tablet Take 1 tablet (20 mg total) by mouth at bedtime.  . gabapentin (NEURONTIN) 100 MG capsule Take 1-2 capsules (100-200 mg total) by mouth at bedtime.  Marland Kitchen levonorgestrel (MIRENA) 20 MCG/24HR IUD 1 each by Intrauterine route once.  . rizatriptan (MAXALT) 5 MG tablet Take 1-2 tablets (5-10 mg total) by mouth as needed for migraine. May repeat in 2 hours if needed, no more than 18m in 24hrs  . [DISCONTINUED] albuterol (VENTOLIN HFA) 108 (90 Base) MCG/ACT inhaler Inhale 1 puff into the lungs every 6 (six) hours as needed for wheezing or shortness of breath.  . [DISCONTINUED] traZODone (DESYREL) 50 MG tablet Take 1 tablet (50 mg total) by mouth at bedtime. (Patient not taking: Reported on 08/26/2020)  . [DISCONTINUED] Vitamin D, Ergocalciferol, (DRISDOL) 1.25 MG (50000 UNIT) CAPS capsule Take 1 capsule (50,000 Units total) by mouth every 7 (seven) days.   No facility-administered encounter medications on file as of 08/26/2020.  :  Review of Systems:  Out of a complete 14 point review of systems, all are reviewed and negative with the exception of these symptoms as listed below: Review of Systems  Neurological:       Here for sleep consult. No prior sleep study, consult was completed but did not pursue testing at that time. Reports some snoring, does not rest well at night. Epworth Sleepiness Scale 0= would never doze 1= slight chance of dozing 2= moderate chance of dozing 3= high chance of dozing  Sitting and  reading:3 Watching TV:3 Sitting inactive in a public place (ex. Theater or meeting):2 As a passenger in a car for an hour without a break:3 Lying down to rest in the afternoon:3 Sitting and talking to someone:0 Sitting quietly after lunch (no alcohol):2 In a car, while stopped in traffic:1 Total:17     Objective:  Neurological Exam  Physical Exam Physical Examination:   Vitals:   08/26/20 1121  BP: 124/84  Pulse: 90  SpO2: 98%    General Examination: The patient is a very pleasant 30y.o. female in no acute distress. She appears well-developed and well-nourished and well groomed.   HEENT: Normocephalic, atraumatic, pupils are equal, round and reactive to light. Corrective eyeglasses in place. Extraocular tracking is good without limitation to gaze excursion or nystagmus noted. Normal smooth pursuit is noted. Hearing is grossly intact. Face is symmetric with normal facial animation. Speech is clear with no dysarthria noted. There is no hypophonia. There is no lip, neck/head, jaw or voice tremor. Neck is supple with full range of passive and active motion. There are no carotid bruits on auscultation. Oropharynx exam reveals: mild mouth dryness, good dental hygiene and mild airway crowding. Tongue protrudes centrally and palate elevates symmetrically.   Chest: Clear to auscultation without wheezing, rhonchi or crackles noted.  Heart: S1+S2+0, regular and normal without murmurs, rubs or gallops noted.   Abdomen: Soft, non-tender and non-distended.  Extremities: There is no  obvious edema in the distal lower extremities bilaterally.  Skin: Warm and dry without trophic changes noted.  Musculoskeletal: exam reveals no obvious joint deformities.   Neurologically:  Mental status: The patient is awake, alert and oriented in all 4 spheres. Her immediate and remote memory, attention, language skills and fund of knowledge are appropriate. There is no evidence of aphasia, agnosia,  apraxia or anomia. Speech is clear with normal prosody and enunciation. Thought process is linear. Mood is normal and affect is normal.  Cranial nerves II - XII are as  described above under HEENT exam. In addition: shoulder shrug is normal with equal shoulder height noted. Motor exam: Normal bulk, strength and tone is noted. There is no drift, tremor or rebound. Romberg is negative. Fine motor skills and coordination: grossly intact.  Cerebellar testing: No dysmetria or intention tremor. There is no truncal or gait ataxia.  Sensory exam: intact to light touch in the upper and lower extremities.  Gait, station and balance: She stands easily. No veering to one side is noted. No leaning to one side is noted. Posture is age-appropriate and stance is narrow based. Gait shows normal stride length and normal pace. No problems turning are noted. Tandem walk is unremarkable.   Assessment and Plan:  In summary,Carmen Wall is a 30 year-old femalewith an underlying medical of migraines, anxiety, depression, arthritis, allergies and severe obesity with a BMI of over 40, who presents for follow-up consultation of her sleep disturbance.  She reports a longstanding history of daytime somnolence.  We had talked about extended sleep testing in 2020, including a nighttime sleep study with a daytime nap study next day.  She has had some medication changes in the recent past.  Nevertheless, she does not believe that she would do well off her antidepressant medication.  We mutually agreed to pursue a nocturnal polysomnogram at this time to look for an underlying organic sleep disturbance such as obstructive sleep apnea.  She does have a family history of this.  We talked about sleep apnea, its prognosis and treatment options.  If she has OSA, she is advised that I will likely offer treatment with a CPAP or AutoPap machine.  We will plan a follow-up after testing.  She does not have to taper any of her medications in  preparation for her nocturnal polysomnogram.  She also endorses restless sleep including some symptoms in keeping with restless leg syndrome.  We will look out for periodic leg movements during sleep in her sleep study.  I answered all her questions today and she was in agreement with the plan.

## 2020-08-26 NOTE — Patient Instructions (Signed)
It was good to see you again today.  As discussed, you could be at risk for underlying obstructive sleep apnea.  Since it will be difficult for you to taper off your antidepressant medications, we will proceed with a nighttime sleep study only.  As explained, an attended sleep study meaning you get to stay overnight in the sleep lab, lets [place] monitor sleep-related behaviors such as sleep talking and leg movements in sleep, in addition to monitoring for sleep apnea.  A home sleep test is a screening tool for sleep apnea only, and unfortunately does not help with any other sleep-related diagnoses.  Please remember, the long-term risks and ramifications of untreated moderate to severe obstructive sleep apnea are: increased Cardiovascular disease, including congestive heart failure, stroke, difficult to control hypertension, treatment resistant obesity, arrhythmias, especially irregular heartbeat commonly known as A. Fib. (atrial fibrillation); even type 2 diabetes has been linked to untreated OSA.   Our sleep lab administrative assistant will call you to schedule your sleep study and give you further instructions, regarding the check in process for the sleep study, arrival time, what to bring, when you can expect to leave after the study, etc., and to answer any other logistical questions you may have. If you don't hear back from her by about 2 weeks from now, please feel free to call her direct line at [phone] or you can call our general clinic number, or email [place] through My Chart.

## 2020-09-02 ENCOUNTER — Telehealth: Payer: Self-pay

## 2020-09-02 NOTE — Telephone Encounter (Signed)
LVM for pt to call me back to schedule sleep study  

## 2020-09-08 ENCOUNTER — Telehealth: Payer: Self-pay

## 2020-09-08 NOTE — Progress Notes (Signed)
30 y.o. G1P1001 Legally Separated White or Caucasian Not Hispanic or Latino female here for annual exam. Possible removal of IUD discussion. She has a mirena IUD, placed in 10/18. She has intermittent spotting to light bleeding with the IUD. She is getting married May 2, would like to get pregnant. Will plan removal when she gets back from her honeymoon.   PAunt with Down's syndrome (other 4 kids were fine). No other h/o chromosomal or genetic disorders.   Sexually active, no pain.  Long term issues with constipation. Comes and goes. She has a BM 1-2 x a day, can be a small to normal amount. Sometimes needs to strain.     No LMP recorded. (Menstrual status: IUD).          Sexually active: Yes.    The current method of family planning is IUD.    Exercising: No.  The patient has a physically strenuous job, but has no regular exercise apart from work.  Smoker:  no  Health Maintenance: Pap: 09/03/19 normal, negative hpv; 08/31/16 normal  History of abnormal Pap:  no MMG:  none BMD:    None  Colonoscopy: none TDaP:  01/2016 Gardasil: complete    reports that she has never smoked. She has never used smokeless tobacco. She reports that she does not drink alcohol and does not use drugs. 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   Past Medical History:  Diagnosis Date  . ADHD   . Allergy   . Anxiety   . Anxiety   . Arthritis   . Back pain   . Bilateral swelling of feet   . Chronic fatigue syndrome   . Constipation   . Depression   . IBS (irritable bowel syndrome)   . Joint pain   . Lactose intolerance   . Migraine without aura     Past Surgical History:  Procedure Laterality Date  . INTRAUTERINE DEVICE (IUD) INSERTION  04/2017   Mirena   . KNEE ARTHROSCOPY Right     Current Outpatient Medications  Medication Sig Dispense Refill  . buPROPion (WELLBUTRIN XL) 300 MG 24 hr tablet Take 1 tablet (300 mg total) by mouth daily. 90 tablet 3  . escitalopram  (LEXAPRO) 20 MG tablet Take 1 tablet (20 mg total) by mouth at bedtime. 90 tablet 3  . gabapentin (NEURONTIN) 100 MG capsule Take 1-2 capsules (100-200 mg total) by mouth at bedtime. 60 capsule 5  . levonorgestrel (MIRENA) 20 MCG/24HR IUD 1 each by Intrauterine route once.    . rizatriptan (MAXALT) 5 MG tablet Take 1-2 tablets (5-10 mg total) by mouth as needed for migraine. May repeat in 2 hours if needed, no more than 30mg  in 24hrs 10 tablet 0   No current facility-administered medications for this visit.    Family History  Problem Relation Age of Onset  . Diabetes Father   . Arthritis Father   . Depression Father   . Mental illness Father        PTSD, anxiety  . Hypertension Father   . Hyperlipidemia Father   . Anxiety disorder Father   . Bipolar disorder Father   . Sleep apnea Father   . Alcoholism Father   . Obesity Father   . Arthritis Mother        osteoartthritis  . Fibroids Mother   . Ovarian cysts Mother   . Obesity Mother   . Kidney disease Maternal Grandmother   . Arthritis Maternal Grandmother   .  Prostate cancer Maternal Grandfather   . Arthritis Maternal Grandfather   . Arthritis Paternal Grandmother   . Diabetes Paternal Grandfather   . Mental illness Paternal Grandfather   . Arthritis Paternal Grandfather     Review of Systems  All other systems reviewed and are negative.   Exam:   BP 122/74   Pulse 69   Ht 5\' 7"  (1.702 m)   Wt 290 lb (131.5 kg)   SpO2 99%   BMI 45.42 kg/m   Weight change: @WEIGHTCHANGE @ Height:   Height: 5\' 7"  (170.2 cm)  Ht Readings from Last 3 Encounters:  09/09/20 5\' 7"  (1.702 m)  08/26/20 5\' 7"  (1.702 m)  07/29/20 5\' 7"  (1.702 m)    General appearance: alert, cooperative and appears stated age Head: Normocephalic, without obvious abnormality, atraumatic Neck: no adenopathy, supple, symmetrical, trachea midline and thyroid normal to inspection and palpation Lungs: clear to auscultation bilaterally Cardiovascular: regular  rate and rhythm Breasts: normal appearance, no masses or tenderness Abdomen: soft, non-tender; non distended,  no masses,  no organomegaly Extremities: extremities normal, atraumatic, no cyanosis or edema Skin: Skin color, texture, turgor normal. No rashes or lesions Lymph nodes: Cervical, supraclavicular, and axillary nodes normal. No abnormal inguinal nodes palpated Neurologic: Grossly normal   Pelvic: External genitalia:  no lesions              Urethra:  normal appearing urethra with no masses, tenderness or lesions              Bartholins and Skenes: normal                 Vagina: normal appearing vagina with normal color and discharge, no lesions              Cervix: no lesions and IUD strings seen               Bimanual Exam:  Uterus:  no masses or tenderness              Adnexa: no mass, fullness, tenderness               Rectovaginal: Confirms               Anus:  normal sphincter tone, no lesions  11/09/20 chaperoned for the exam.  1. Well woman exam Discussed breast self exam Discussed calcium and vit D intake No pap today Labs with primary  2. IUD check up Doing well  3. BMI 45.0-49.9, adult (HCC) Trying to eat healthier  4. Encounter for preconception consultation Discussed weight loss Discussed medications, she will come off of gabapentin, maxalt and Wellbutrin (feels she will be okay off of Wellbutrin)  - [place] screen -Information given -start PNV

## 2020-09-08 NOTE — Telephone Encounter (Signed)
LVM for pt to call me back to schedule sleep study  

## 2020-09-09 ENCOUNTER — Ambulatory Visit (INDEPENDENT_AMBULATORY_CARE_PROVIDER_SITE_OTHER): Payer: Managed Care, Other (non HMO) | Admitting: Obstetrics and Gynecology

## 2020-09-09 ENCOUNTER — Other Ambulatory Visit: Payer: Self-pay

## 2020-09-09 ENCOUNTER — Encounter: Payer: Self-pay | Admitting: Obstetrics and Gynecology

## 2020-09-09 VITALS — BP 122/74 | HR 69 | Ht 67.0 in | Wt 290.0 lb

## 2020-09-09 DIAGNOSIS — Z01419 Encounter for gynecological examination (general) (routine) without abnormal findings: Secondary | ICD-10-CM | POA: Diagnosis not present

## 2020-09-09 DIAGNOSIS — Z3169 Encounter for other general counseling and advice on procreation: Secondary | ICD-10-CM | POA: Diagnosis not present

## 2020-09-09 DIAGNOSIS — M654 Radial styloid tenosynovitis [de Quervain]: Secondary | ICD-10-CM

## 2020-09-09 DIAGNOSIS — Z6841 Body Mass Index (BMI) 40.0 and over, adult: Secondary | ICD-10-CM

## 2020-09-09 DIAGNOSIS — Z30431 Encounter for routine checking of intrauterine contraceptive device: Secondary | ICD-10-CM | POA: Diagnosis not present

## 2020-09-09 DIAGNOSIS — N920 Excessive and frequent menstruation with regular cycle: Secondary | ICD-10-CM | POA: Insufficient documentation

## 2020-09-09 DIAGNOSIS — R300 Dysuria: Secondary | ICD-10-CM | POA: Insufficient documentation

## 2020-09-09 DIAGNOSIS — M549 Dorsalgia, unspecified: Secondary | ICD-10-CM | POA: Insufficient documentation

## 2020-09-09 DIAGNOSIS — M542 Cervicalgia: Secondary | ICD-10-CM

## 2020-09-09 HISTORY — DX: Cervicalgia: M54.2

## 2020-09-09 HISTORY — DX: Dorsalgia, unspecified: M54.9

## 2020-09-09 HISTORY — DX: Radial styloid tenosynovitis (de quervain): M65.4

## 2020-09-09 NOTE — Patient Instructions (Addendum)
You can try 500 mg a day of Magnesium for constipation.  EXERCISE   We recommended that you start or continue a regular exercise program for good health. Physical activity is anything that gets your body moving, some is better than none. The CDC recommends 150 minutes per week of Moderate-Intensity Aerobic Activity and 2 or more days of Muscle Strengthening Activity.  Benefits of exercise are limitless: helps weight loss/weight maintenance, improves mood and energy, helps with depression and anxiety, improves sleep, tones and strengthens muscles, improves balance, improves bone density, protects from chronic conditions such as heart disease, high blood pressure and diabetes and so much more. To learn more visit: http://kirby-bean.org/  DIET: Good nutrition starts with a healthy diet of fruits, vegetables, whole grains, and lean protein sources. Drink plenty of water for hydration. Minimize empty calories, sodium, sweets. For more information about dietary recommendations visit: CriticalGas.be and https://www.carpenter-henry.info/  ALCOHOL:  Women should limit their alcohol intake to no more than 7 drinks/beers/glasses of wine (combined, not each!) per week. Moderation of alcohol intake to this level decreases your risk of breast cancer and liver damage.  If you are concerned that you may have a problem, or your friends have told you they are concerned about your drinking, there are many resources to help. A well-known program that is free, effective, and available to all people all over the nation is Alcoholics Anonymous.  Check out this site to learn more: BeverageBargains.co.za   CALCIUM AND VITAMIN D:  Adequate intake of calcium and Vitamin D are recommended for bone health.  You should be getting between 1000-1200 mg of calcium and 800 units of Vitamin D daily between diet and supplements  PAP SMEARS:  Pap smears, to check for  cervical cancer or precancers,  have traditionally been done yearly, scientific advances have shown that most women can have pap smears less often.  However, every woman still should have a physical exam from her gynecologist every year. It will include a breast check, inspection of the vulva and vagina to check for abnormal growths or skin changes, a visual exam of the cervix, and then an exam to evaluate the size and shape of the uterus and ovaries. We will also provide age appropriate advice regarding health maintenance, like when you should have certain vaccines, screening for sexually transmitted diseases, bone density testing, colonoscopy, mammograms, etc.   MAMMOGRAMS:  All women over 29 years old should have a routine mammogram.   COLON CANCER SCREENING: Now recommend starting at age 36. At this time colonoscopy is not covered for routine screening until 50. There are take home tests that can be done between 45-49.   COLONOSCOPY:  Colonoscopy to screen for colon cancer is recommended for all women at age 30.  We know, you hate the idea of the prep.  We agree, BUT, having colon cancer and not knowing it is worse!!  Colon cancer so often starts as a polyp that can be seen and removed at colonscopy, which can quite literally save your life!  And if your first colonoscopy is normal and you have no family history of colon cancer, most women don't have to have it again for 10 years.  Once every ten years, you can do something that may end up saving your life, right?  We will be happy to help you get it scheduled when you are ready.  Be sure to check your insurance coverage so you understand how much it will cost.  It may be  covered as a preventative service at no cost, but you should check your particular policy.      Breast Self-Awareness Breast self-awareness means being familiar with how your breasts look and feel. It involves checking your breasts regularly and reporting any changes to your health  care provider. Practicing breast self-awareness is important. A change in your breasts can be a sign of a serious medical problem. Being familiar with how your breasts look and feel allows you to find any problems early, when treatment is more likely to be successful. All women should practice breast self-awareness, including women who have had breast implants. How to do a breast self-exam One way to learn what is normal for your breasts and whether your breasts are changing is to do a breast self-exam. To do a breast self-exam: Look for Changes  1. Remove all the clothing above your waist. 2. Stand in front of a mirror in a room with good lighting. 3. Put your hands on your hips. 4. Push your hands firmly downward. 5. Compare your breasts in the mirror. Look for differences between them (asymmetry), such as: ? Differences in shape. ? Differences in size. ? Puckers, dips, and bumps in one breast and not the other. 6. Look at each breast for changes in your skin, such as: ? Redness. ? Scaly areas. 7. Look for changes in your nipples, such as: ? Discharge. ? Bleeding. ? Dimpling. ? Redness. ? A change in position. Feel for Changes Carefully feel your breasts for lumps and changes. It is best to do this while lying on your back on the floor and again while sitting or standing in the shower or tub with soapy water on your skin. Feel each breast in the following way:  Place the arm on the side of the breast you are examining above your head.  Feel your breast with the other hand.  Start in the nipple area and make  inch (2 cm) overlapping circles to feel your breast. Use the pads of your three middle fingers to do this. Apply light pressure, then medium pressure, then firm pressure. The light pressure will allow you to feel the tissue closest to the skin. The medium pressure will allow you to feel the tissue that is a little deeper. The firm pressure will allow you to feel the tissue close  to the ribs.  Continue the overlapping circles, moving downward over the breast until you feel your ribs below your breast.  Move one finger-width toward the center of the body. Continue to use the  inch (2 cm) overlapping circles to feel your breast as you move slowly up toward your collarbone.  Continue the up and down exam using all three pressures until you reach your armpit.  Write Down What You Find  Write down what is normal for each breast and any changes that you find. Keep a written record with breast changes or normal findings for each breast. By writing this information down, you do not need to depend only on memory for size, tenderness, or location. Write down where you are in your menstrual cycle, if you are still menstruating. If you are having trouble noticing differences in your breasts, do not get discouraged. With time you will become more familiar with the variations in your breasts and more comfortable with the exam. How often should I examine my breasts? Examine your breasts every month. If you are breastfeeding, the best time to examine your breasts is after a feeding or  after using a breast pump. If you menstruate, the best time to examine your breasts is 5-7 days after your period is over. During your period, your breasts are lumpier, and it may be more difficult to notice changes. When should I see my health care provider? See your health care provider if you notice:  A change in shape or size of your breasts or nipples.  A change in the skin of your breast or nipples, such as a reddened or scaly area.  Unusual discharge from your nipples.  A lump or thick area that was not there before.  Pain in your breasts.  Anything that concerns you.  Preparing for Pregnancy If you are planning to become pregnant, talk to your health care provider about preconception care. This type of care helps you prepare for a safe and healthy pregnancy. During this visit, your health  care provider will:  Do a complete physical exam, including a Pap test.  Take your complete medical history.  Give you information, answer your questions, and help you resolve problems. Preconception checklist Medical history  Tell your health care provider about any medical conditions you have or have had. Your pregnancy or your ability to become pregnant may be affected by long-term (chronic) conditions, such as: ? Diabetes. ? High blood pressure (hypertension). ? Thyroid problems.  Tell your health care provider about your family's medical history and your partner's medical history.  Tell your health care provider if you have or have had any sexually transmitted infections, orSTIs. These can affect your pregnancy. In some cases, they can be passed to your baby.  If needed, discuss the benefits of genetic testing. This test checks for conditions that may be passed from parent to child.  Tell your health care provider about: ? Any problems you had getting pregnant or while pregnant. ? Any medicines you take. These include vitamins, herbal supplements, and over-the-counter medicines. ? Your history of getting vaccines. Discuss any vaccines that you may need. Diet  Ask your health care provider about what foods to eat in order to get a balance of nutrients. This is especially important when you are pregnant or preparing to become pregnant. It is recommended that women of childbearing age take a folic acid supplement of 400 mcg daily and eat foods rich in folic acid to prevent certain birth defects.  Ask your health care provider to help you reach a healthy weight before pregnancy. ? If you are overweight, you may have a higher risk for certain problems. These include hypertension, diabetes, and early (preterm) birth. ? If you are underweight, you are more likely to have a baby who has a low birth weight. Lifestyle, work, and home Let your health care provider know about:  Any  lifestyle habits that you have, such as use of alcohol, drugs, or tobacco products.  Fun and leisure activities that may put you at risk during pregnancy, such as downhill skiing and certain exercise programs.  Any plans to travel out of the country, especially to places with an active [place] virus outbreak.  Harmful substances that you may be exposed to at work or at home. These include chemicals, pesticides, radiation, and substances from cat litter boxes.  Any concerns you have for your safety at home. Mental health Tell your health care provider about:  Any history of mental health conditions, including feelings of depression, sadness, or anxiety.  Any medicines that you take for a mental health condition. These include herbs and supplements. How  do I know that I am pregnant? You may be pregnant if you have been sexually active and you miss your period. Other symptoms of early pregnancy include:  Mild cramping.  Very light vaginal bleeding (spotting).  Feeling more tired than usual.  Nausea and vomiting. These may be signs of morning sickness. Take a home pregnancy test if you have any of these symptoms. This test checks for a hormone in your urine called human chorionic gonadotropin, or hCG. A woman's body begins to make this hormone during early pregnancy. These tests are very accurate. Wait until at least the first day after you miss your period to take a home pregnancy test. If the test shows that you are pregnant, call your health care provider for a prenatal care visit. What should I do if I become pregnant?  Schedule a visit with your health care provider as soon as you suspect you are pregnant.  Talk to your health care provider if you are taking prescription medicines to determine if they are safe to take during pregnancy.  You may continue to have sex if it does not cause pain or other problems, such as vaginal bleeding. Follow these instructions at home: Eating and  drinking  Follow instructions from your health care provider about eating or drinking restrictions.  Drink enough fluid to keep your urine pale yellow.  Eat a balanced diet. This includes fresh fruits and vegetables, whole grains, lean meats, low-fat dairy products, healthy fats, and foods that are high in fiber. Ask to meet with a nutritionist or registered dietitian for help with meal planning and goals.  Avoid eating raw or undercooked meat and seafood.  Avoid eating or drinking unpasteurized dairy products.   Lifestyle  Get regular exercise. Try to be active for at least 30 minutes a day on most days of the week. Ask your health care provider which activities are safe during pregnancy.  Maintain a healthy weight.  Avoid toxic fumes and chemicals.  Avoid cleaning cat litter boxes. Cat feces may contain a harmful parasite called toxoplasma.  Avoid travel to countries where [place] virus is common.  Do not use any products that contain nicotine or tobacco, such as cigarettes, e-cigarettes, and chewing tobacco. If you need help quitting, ask your health care provider.  Do not drink alcohol or use drugs.      General instructions  Keep an accurate record of your menstrual periods. This makes it easier for your health care provider to determine your baby's due date.  Take over-the-counter and prescription medicines only as told by your health care provider.  Begin taking prenatal vitamins and folic acid supplements daily as directed.  Manage any chronic conditions, such as hypertension and diabetes, as told by your health care provider. This is important. Summary  If you are planning to become pregnant, talk to your health care provider about preconception care. This is an important part of planning for a healthy pregnancy.  Women of childbearing age should take 400 mcg of folic acid daily in addition to eating a diet rich in folic acid. This will prevent certain birth  defects.  Schedule a visit with your health care provider as soon as you suspect you are pregnant. Tell your health care provider about your medical history, lifestyle activities, home safety, and other things that may concern you. This information is not intended to replace advice given to you by your health care provider. Make sure you discuss any questions you have with your health  care provider. Document Revised: 02/20/2019 Document Reviewed: 02/20/2019 Elsevier Patient Education  2021 [employer].

## 2020-09-10 LAB — RUBELLA SCREEN: Rubella: 6 Index

## 2020-09-14 ENCOUNTER — Telehealth: Payer: Self-pay

## 2020-09-14 NOTE — Telephone Encounter (Signed)
We have attempted to call the patient two times to schedule sleep study.  Patient has been unavailable at the phone numbers we have on file and has not returned our calls. If patient calls back we will schedule them for their sleep study.  

## 2020-10-14 ENCOUNTER — Encounter: Payer: Self-pay | Admitting: Obstetrics and Gynecology

## 2020-10-14 ENCOUNTER — Ambulatory Visit: Payer: Managed Care, Other (non HMO) | Admitting: Obstetrics and Gynecology

## 2020-10-14 ENCOUNTER — Other Ambulatory Visit: Payer: Self-pay

## 2020-10-14 VITALS — BP 122/80 | HR 83 | Ht 67.0 in | Wt 297.0 lb

## 2020-10-14 DIAGNOSIS — Z113 Encounter for screening for infections with a predominantly sexual mode of transmission: Secondary | ICD-10-CM | POA: Diagnosis not present

## 2020-10-14 DIAGNOSIS — Z30432 Encounter for removal of intrauterine contraceptive device: Secondary | ICD-10-CM | POA: Diagnosis not present

## 2020-10-14 DIAGNOSIS — N760 Acute vaginitis: Secondary | ICD-10-CM | POA: Diagnosis not present

## 2020-10-14 DIAGNOSIS — A5901 Trichomonal vulvovaginitis: Secondary | ICD-10-CM

## 2020-10-14 DIAGNOSIS — B9689 Other specified bacterial agents as the cause of diseases classified elsewhere: Secondary | ICD-10-CM | POA: Diagnosis not present

## 2020-10-14 LAB — WET PREP FOR TRICH, YEAST, CLUE

## 2020-10-14 MED ORDER — METRONIDAZOLE 500 MG PO TABS: 500.0000 mg | ORAL_TABLET | Freq: Two times a day (BID) | ORAL | 0 refills | Status: DC

## 2020-10-14 NOTE — Progress Notes (Signed)
GYNECOLOGY  VISIT   HPI: 30 y.o.   Legally Separated White or Caucasian Not Hispanic or Latino  female   G1P1001 with No LMP recorded. (Menstrual status: IUD).   here for IUD removal. She just got married and wants to get pregnant. She went off all of her medication other than lexapro and vit d. She has started a PNV.  She has noticed an increase in pink vaginal d/c for the last few days with slight irritation.   GYNECOLOGIC HISTORY: No LMP recorded. (Menstrual status: IUD). Contraception: IUD  Menopausal hormone therapy: none         OB History    Gravida  1   Para  1   Term  1   Preterm      AB      Living  1     SAB      IAB      Ectopic      Multiple      Live Births  1              Patient Active Problem List   Diagnosis Date Noted  . Backache 09/09/2020  . Dysuria 09/09/2020  . Excessive or frequent menstruation 09/09/2020  . Neck pain 09/09/2020  . Radial styloid tenosynovitis 09/09/2020  . Other hyperlipidemia 12/12/2019  . 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 Vitamin D deficiency 11/25/2019  . Piriformis syndrome of right side 11/11/2019  . Cough variant asthma 09/26/2019  . Excessive daytime sleepiness 03/13/2019  . 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 Acute right ankle pain 10/24/2018  . Class 3 severe obesity with serious comorbidity and body mass index (BMI) of 40.0 to 44.9 in adult [facility]) 08/24/2018  . GAD (generalized anxiety disorder) 08/24/2018  . Acute pain of right knee 07/23/2018  . Chronic right-sided low back pain with right-sided sciatica 05/15/2018  . 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 Migraine without aura   . Arthritis   . Allergy   . Increased bowel frequency 07/12/2016  . Increased urinary frequency 07/12/2016  . Rhinitis, allergic 09/09/2014    Past Medical History:  Diagnosis Date  . ADHD   . Allergy   . Anxiety   . Anxiety   . Arthritis   . Back pain   . Bilateral  swelling of feet   . Chronic fatigue syndrome   . Constipation   . Depression   . IBS (irritable bowel syndrome)   . Joint pain   . Lactose intolerance   . Migraine without aura     Past Surgical History:  Procedure Laterality Date  . INTRAUTERINE DEVICE (IUD) INSERTION  04/2017   Mirena   . KNEE ARTHROSCOPY Right     Current Outpatient Medications  Medication Sig Dispense Refill  . escitalopram (LEXAPRO) 20 MG tablet Take 1 tablet (20 mg total) by mouth at bedtime. 90 tablet 3  . levonorgestrel (MIRENA) 20 MCG/24HR IUD 1 each by Intrauterine route once.     No current facility-administered medications for this visit.     ALLERGIES: Patient has no known allergies.  Family History  Problem Relation Age of Onset  . Diabetes Father   . Arthritis Father   . Depression Father   . Mental illness Father        PTSD, anxiety  . Hypertension Father   . Hyperlipidemia Father   . Anxiety disorder Father   . Bipolar disorder Father   . Sleep apnea Father   .  Alcoholism Father   . Obesity Father   . Arthritis Mother        osteoartthritis  . Fibroids Mother   . Ovarian cysts Mother   . Obesity Mother   . Kidney disease Maternal Grandmother   . Arthritis Maternal Grandmother   . Prostate cancer Maternal Grandfather   . Arthritis Maternal Grandfather   . Arthritis Paternal Grandmother   . Diabetes Paternal Grandfather   . Mental illness Paternal Grandfather   . Arthritis Paternal Grandfather     Social History   Socioeconomic History  . Marital status: Legally Separated    Spouse name: Not on file  . Number of children: Not on file  . Years of education: Not on file  . Highest education level: Not on file  Occupational History  . Not on file  Tobacco Use  . Smoking status: Never Smoker  . Smokeless tobacco: Never Used  Vaping Use  . Vaping Use: Never used  Substance and Sexual Activity  . Alcohol use: No    Alcohol/week: 0.0 standard drinks  . Drug use: No   . Sexual activity: Yes    Partners: Male    Birth control/protection: I.U.D.  Other Topics Concern  . Not on file  Social History Narrative  . Not on file   Social Determinants of Health   Financial Resource Strain: Not on file  Food Insecurity: Not on file  Transportation Needs: Not on file  Physical Activity: Not on file  Stress: Not on file  Social Connections: Not on file  Intimate Partner Violence: Not on file    Review of Systems  All other systems reviewed and are negative.   PHYSICAL EXAMINATION:    BP 122/80   Pulse 83   Ht 5\' 7"  (1.702 m)   Wt 297 lb (134.7 kg)   SpO2 100%   BMI 46.52 kg/m     General appearance: alert, cooperative and appears stated age   Pelvic: External genitalia:  no lesions              Urethra:  normal appearing urethra with no masses, tenderness or lesions              Bartholins and Skenes: normal                 Vagina: normal appearing vagina with an increase in watery, yellow/white vaginal d/c with odor.              Cervix: no lesions, IUD strings grasped and IUD was removed with ringed forceps.  Chaperone was present for exam.  1. Encounter for IUD removal IUD removed She is on PNV  2. Acute vaginitis - WET PREP FOR TRICH, YEAST, CLUE: + trich and BV  3. Trichomoniasis of vagina Patient and her husband were counseled - metroNIDAZOLE (FLAGYL) 500 MG tablet; Take 1 tablet (500 mg total) by mouth 2 (two) times daily.  Dispense: 14 tablet; Refill: 0 -No ETOH while on flagyl or for 24 hours after -they should avoid intercourse until they have both completed treatment -She needs repeat testing in one month -He was advised to get full STD testing  4. BV (bacterial vaginosis) - metroNIDAZOLE (FLAGYL) 500 MG tablet; Take 1 tablet (500 mg total) by mouth 2 (two) times daily.  Dispense: 14 tablet; Refill: 0  5. Screening examination for STD (sexually transmitted disease) - RPR - HIV Antibody (routine testing w rflx) -  Hepatitis C antibody - SURESWAB CT/NG/T. vaginalis  Over 30 minutes in total patient care

## 2020-10-14 NOTE — Patient Instructions (Signed)
Bacterial Vaginosis  Bacterial vaginosis is an infection that occurs when the normal balance of bacteria in the vagina changes. This change is caused by an overgrowth of certain bacteria in the vagina. Bacterial vaginosis is the most common vaginal infection among females aged 32 to 44 years. This condition increases the risk of sexually transmitted infections (STIs). Treatment can help reduce this risk. Treatment is very important for pregnant women because this condition can cause babies to be born early (prematurely) or at a low birth weight. What are the causes? This condition is caused by an increase in harmful bacteria that are normally present in small amounts in the vagina. However, the exact reason this condition develops is not known. You cannot get bacterial vaginosis from toilet seats, bedding, swimming pools, or contact with objects around you. What increases the risk? The following factors may make you more likely to develop this condition:  Having a new sexual partner or multiple sexual partners, or having unprotected sex.  Douching.  Having an intrauterine device (IUD).  Smoking.  Abusing drugs and alcohol. This may lead to riskier sexual behavior.  Taking certain antibiotic medicines.  Being pregnant. What are the signs or symptoms? Some women with this condition have no symptoms. Symptoms may include:  Gray or white vaginal discharge. The discharge can be watery or foamy.  A fish-like odor with discharge, especially after sex or during menstruation.  Itching in and around the vagina.  Burning or pain with urination. How is this diagnosed? This condition is diagnosed based on:  Your medical history.  A physical exam of the vagina.  Checking a sample of vaginal fluid for harmful bacteria or abnormal cells. How is this treated? This condition is treated with antibiotic medicines. These may be given as a pill, a vaginal cream, or a medicine that is put into the  vagina (suppository). If the condition comes back after treatment, a second round of antibiotics may be needed. Follow these instructions at home: Medicines  Take or apply over-the-counter and prescription medicines only as told by your health care provider.  Take or apply your antibiotic medicine as told by your health care provider. Do not stop using the antibiotic even if you start to feel better. General instructions  If you have a female sexual partner, tell her that you have a vaginal infection. She should follow up with her health care provider. If you have a female sexual partner, he does not need treatment.  Avoid sexual activity until you finish treatment.  Drink enough fluid to keep your urine pale yellow.  Keep the area around your vagina and rectum clean. ? Wash the area daily with warm water. ? Wipe yourself from front to back after using the toilet.  If you are breastfeeding, talk to your health care provider about continuing breastfeeding during treatment.  Keep all follow-up visits. This is important. How is this prevented? Self-care  Do not douche.  Wash the outside of your vagina with warm water only.  Wear cotton or cotton-lined underwear.  Avoid wearing tight pants and pantyhose, especially during the summer. Safe sex  Use protection when having sex. This includes: ? Using condoms. ? Using dental dams. This is a thin layer of a material made of latex or polyurethane that protects the mouth during oral sex.  Limit the number of sexual partners. To help prevent bacterial vaginosis, it is best to have sex with just one partner (monogamous relationship).  Make sure you and your sexual partner   are tested for STIs. Drugs and alcohol  Do not use any products that contain nicotine or tobacco. These products include cigarettes, chewing tobacco, and vaping devices, such as e-cigarettes. If you need help quitting, ask your health care provider.  Do not use  drugs.  Do not drink alcohol if: ? Your health care provider tells you not to do this. ? You are pregnant, may be pregnant, or are planning to become pregnant.  If you drink alcohol: ? Limit how much you have to 0-1 drink a day. ? Be aware of how much alcohol is in your drink. In the [place] one drink equals one 12 oz bottle of beer (355 mL), one 5 oz glass of wine (148 mL), or one 1 oz glass of hard liquor (44 mL). Where to find more information  Centers for Disease Control and Prevention: http://www.wolf.info/  American Sexual Health Association (ASHA): www.ashastd.org  [place] Department of Health and Financial controller, Office on Women's Health: [place] Contact a health care provider if:  Your symptoms do not improve, even after treatment.  You have more discharge or pain when urinating.  You have a fever or chills.  You have pain in your abdomen or pelvis.  You have pain during sex.  You have vaginal bleeding between menstrual periods. Summary  Bacterial vaginosis is a vaginal infection that occurs when the normal balance of bacteria in the vagina changes. It results from an overgrowth of certain bacteria.  This condition increases the risk of sexually transmitted infections (STIs). Getting treated can help reduce this risk.  Treatment is very important for pregnant women because this condition can cause babies to be born early (prematurely) or at low birth weight.  This condition is treated with antibiotic medicines. These may be given as a pill, a vaginal cream, or a medicine that is put into the vagina (suppository). This information is not intended to replace advice given to you by your health care provider. Make sure you discuss any questions you have with your health care provider. Document Revised: 11/21/2019 Document Reviewed: 11/21/2019 Elsevier Patient Education  2021 [employer]. Trichomoniasis Trichomoniasis is an STI (sexually transmitted infection) that can  affect both women and men. In women, the outer area of the female genitalia (vulva) and the vagina are affected. In men, mainly the penis is affected, but the prostate and other reproductive organs can also be involved.  This condition can be treated with medicine. It often has no symptoms (is asymptomatic), especially in men. If not treated, trichomoniasis can last for months or years. What are the causes? This condition is caused by a parasite called Trichomonas vaginalis. Trichomoniasis most often spreads from person to person (is contagious) through sexual contact. What increases the risk? The following factors may make you more likely to develop this condition:  Having unprotected sex.  Having sex with a partner who has trichomoniasis.  Having multiple sexual partners.  Having had previous trichomoniasis infections or other STIs. What are the signs or symptoms? In women, symptoms of trichomoniasis include:  Abnormal vaginal discharge that is clear, white, gray, or yellow-green and foamy and has an unusual "fishy" odor.  Itching and irritation of the vagina and vulva.  Burning or pain during urination or sex.  Redness and swelling of the genitals. In men, symptoms of trichomoniasis include:  Penile discharge that may be foamy or contain pus.  Pain in the penis. This may happen only when urinating.  Itching or irritation inside the penis.  Burning  after urination or ejaculation. How is this diagnosed? In women, this condition may be found during a routine Pap test or physical exam. It may be found in men during a routine physical exam. Your health care provider may do tests to help diagnose this infection, such as:  Urine tests (men and women).  The following in women: ? Testing the pH of the vagina. ? A vaginal swab test that checks for the Trichomonas vaginalis parasite. ? Testing vaginal secretions. Your health care provider may test you for other STIs, including HIV  (human immunodeficiency virus). How is this treated? This condition is treated with medicine taken by mouth (orally), such as metronidazole or tinidazole, to fight the infection. Your sexual partner(s) also need to be tested and treated.  If you are a woman and you plan to become pregnant or think you may be pregnant, tell your health care provider right away. Some medicines that are used to treat the infection should not be taken during pregnancy. Your health care provider may recommend over-the-counter medicines or creams to help relieve itching or irritation. You may be tested for infection again 3 months after treatment.   Follow these instructions at home:  Take and use over-the-counter and prescription medicines, including creams, only as told by your health care provider.  Take your antibiotic medicine as told by your health care provider. Do not stop taking the antibiotic even if you start to feel better.  Do not have sex until 7-10 days after you finish your medicine, or until your health care provider approves. Ask your health care provider when you may start to have sex again.  (Women) Do not douche or wear tampons while you have the infection.  Discuss your infection with your sexual partner(s). Make sure that your partner gets tested and treated, if necessary.  Keep all follow-up visits as told by your health care provider. This is important. How is this prevented?  Use condoms every time you have sex. Using condoms correctly and consistently can help protect against STIs.  Avoid having multiple sexual partners.  Talk with your sexual partner about any symptoms that either of you may have, as well as any history of STIs.  Get tested for STIs and STDs (sexually transmitted diseases) before you have sex. Ask your partner to do the same.  Do not have sexual contact if you have symptoms of trichomoniasis or another STI.   Contact a health care provider if:  You still have  symptoms after you finish your medicine.  You develop pain in your abdomen.  You have pain when you urinate.  You have bleeding after sex.  You develop a rash.  You feel nauseous or you vomit.  You plan to become pregnant or think you may be pregnant. Summary  Trichomoniasis is an STI (sexually transmitted infection) that can affect both women and men.  This condition often has no symptoms (is asymptomatic), especially in men.  Without treatment, this condition can last for months or years.  You should not have sex until 7-10 days after you finish your medicine, or until your health care provider approves. Ask your health care provider when you may start to have sex again.  Discuss your infection with your sexual partner(s). Make sure that your partner gets tested and treated, if necessary. This information is not intended to replace advice given to you by your health care provider. Make sure you discuss any questions you have with your health care provider. Document Revised:  03/06/2018 Document Reviewed: 03/06/2018 Elsevier Patient Education  2021 [employer].

## 2020-10-15 LAB — RPR: RPR Ser Ql: NONREACTIVE

## 2020-10-15 LAB — SURESWAB CT/NG/T. VAGINALIS
C. trachomatis RNA, TMA: NOT DETECTED
N. gonorrhoeae RNA, TMA: NOT DETECTED
Trichomonas vaginalis RNA: DETECTED — AB

## 2020-10-15 LAB — HEPATITIS C ANTIBODY
Hepatitis C Ab: NONREACTIVE
SIGNAL TO CUT-OFF: 0 (ref <1.00)

## 2020-10-15 LAB — HIV ANTIBODY (ROUTINE TESTING W REFLEX): HIV 1&2 Ab, 4th Generation: NONREACTIVE

## 2020-10-21 ENCOUNTER — Encounter: Payer: Self-pay | Admitting: Obstetrics and Gynecology

## 2020-10-21 NOTE — Telephone Encounter (Signed)
Was prescribed Metronidazole 500 bid x 7 days on 10/14/20.

## 2020-10-21 NOTE — Telephone Encounter (Signed)
Please give her a note for missing work today.

## 2020-11-06 ENCOUNTER — Other Ambulatory Visit: Payer: Self-pay

## 2020-11-06 ENCOUNTER — Ambulatory Visit (HOSPITAL_COMMUNITY)
Admission: EM | Admit: 2020-11-06 | Discharge: 2020-11-06 | Disposition: A | Discharge status: Home / Self Care | Payer: Managed Care, Other (non HMO) | Attending: Internal Medicine | Admitting: Internal Medicine

## 2020-11-06 ENCOUNTER — Encounter (HOSPITAL_COMMUNITY): Payer: Self-pay

## 2020-11-06 DIAGNOSIS — Z79899 Other long term (current) drug therapy: Secondary | ICD-10-CM | POA: Diagnosis not present

## 2020-11-06 DIAGNOSIS — R112 Nausea with vomiting, unspecified: Secondary | ICD-10-CM | POA: Diagnosis not present

## 2020-11-06 DIAGNOSIS — R42 Dizziness and giddiness: Secondary | ICD-10-CM | POA: Diagnosis not present

## 2020-11-06 DIAGNOSIS — R1030 Lower abdominal pain, unspecified: Secondary | ICD-10-CM | POA: Diagnosis not present

## 2020-11-06 LAB — CBC WITH DIFFERENTIAL/PLATELET
Abs Immature Granulocytes: 0.01 10*3/uL (ref 0.00–0.07)
Basophils Absolute: 0 10*3/uL (ref 0.0–0.1)
Basophils Relative: 1 %
Eosinophils Absolute: 0.1 10*3/uL (ref 0.0–0.5)
Eosinophils Relative: 2 %
HCT: 42 % (ref 36.0–46.0)
Hemoglobin: 14 g/dL (ref 12.0–15.0)
Immature Granulocytes: 0 %
Lymphocytes Relative: 33 %
Lymphs Abs: 1.9 10*3/uL (ref 0.7–4.0)
MCH: 30.5 pg (ref 26.0–34.0)
MCHC: 33.3 g/dL (ref 30.0–36.0)
MCV: 91.5 fL (ref 80.0–100.0)
Monocytes Absolute: 0.6 10*3/uL (ref 0.1–1.0)
Monocytes Relative: 10 %
Neutro Abs: 3.2 10*3/uL (ref 1.7–7.7)
Neutrophils Relative %: 54 %
Platelets: 228 10*3/uL (ref 150–400)
RBC: 4.59 MIL/uL (ref 3.87–5.11)
RDW: 12.5 % (ref 11.5–15.5)
WBC: 5.8 10*3/uL (ref 4.0–10.5)
nRBC: 0 % (ref 0.0–0.2)

## 2020-11-06 LAB — COMPREHENSIVE METABOLIC PANEL
ALT: 20 U/L (ref 0–44)
AST: 25 U/L (ref 15–41)
Albumin: 3.9 g/dL (ref 3.5–5.0)
Alkaline Phosphatase: 57 U/L (ref 38–126)
Anion gap: 8 (ref 5–15)
BUN: 12 mg/dL (ref 6–20)
CO2: 26 mmol/L (ref 22–32)
Calcium: 9.3 mg/dL (ref 8.9–10.3)
Chloride: 103 mmol/L (ref 98–111)
Creatinine, Ser: 0.68 mg/dL (ref 0.44–1.00)
GFR, Estimated: >60 mL/min (ref >60)
Glucose, Bld: 75 mg/dL (ref 70–99)
Potassium: 4 mmol/L (ref 3.5–5.1)
Sodium: 137 mmol/L (ref 135–145)
Total Bilirubin: 0.8 mg/dL (ref 0.3–1.2)
Total Protein: 6.8 g/dL (ref 6.5–8.1)

## 2020-11-06 LAB — VITAMIN D 25 HYDROXY (VIT D DEFICIENCY, FRACTURES): Vit D, 25-Hydroxy: 27.76 ng/mL — ABNORMAL LOW (ref 30–100)

## 2020-11-06 LAB — POC URINE PREG, ED: Preg Test, Ur: NEGATIVE

## 2020-11-06 MED ORDER — ONDANSETRON 4 MG PO TBDP: 4.0000 mg | ORAL_TABLET | Freq: Three times a day (TID) | ORAL | 0 refills | Status: DC | PRN

## 2020-11-06 NOTE — Discharge Instructions (Addendum)
Please buy over-the-counter famotidine Take medications as prescribed We will call you with lab results if abnormal Your urine pregnancy test is negative.

## 2020-11-06 NOTE — ED Provider Notes (Signed)
[facility]    CSN: [id number] Arrival date & time: 11/06/20  1629      History   Chief Complaint Chief Complaint  Patient presents with  . Abdominal Pain  . Nausea  . Dizziness    HPI Carmen Wall is a 30 y.o. female comes to the urgent care with lower abdominal pain, nausea, nonbloody nonbilious vomitus and dizziness of a few days duration.  Symptoms started after her menstrual period ended patient denies any bloating.  She initially thought she was pregnant.  Patient's menses lasted for 4 days.  No change in patient's diet.  Patient also complains about reflux symptoms associated with nausea and an episode of vomiting.  No fever or chills.  No sick contacts.   HPI  Past Medical History:  Diagnosis Date  . ADHD   . Allergy   . Anxiety   . Anxiety   . Arthritis   . Back pain   . Bilateral swelling of feet   . Chronic fatigue syndrome   . Constipation   . Depression   . IBS (irritable bowel syndrome)   . Joint pain   . Lactose intolerance   . Migraine without aura     Patient Active Problem List   Diagnosis Date Noted  . Backache 09/09/2020  . Dysuria 09/09/2020  . Excessive or frequent menstruation 09/09/2020  . Neck pain 09/09/2020  . Radial styloid tenosynovitis 09/09/2020  . Other hyperlipidemia 12/12/2019  . 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 Vitamin D deficiency 11/25/2019  . Piriformis syndrome of right side 11/11/2019  . Cough variant asthma 09/26/2019  . Excessive daytime sleepiness 03/13/2019  . 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 Acute right ankle pain 10/24/2018  . Class 3 severe obesity with serious comorbidity and body mass index (BMI) of 40.0 to 44.9 in adult [facility]) 08/24/2018  . GAD (generalized anxiety disorder) 08/24/2018  . Acute pain of right knee 07/23/2018  . Chronic right-sided low back pain with right-sided sciatica 05/15/2018  . 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  Migraine without aura   . Arthritis   . Allergy   . Increased bowel frequency 07/12/2016  . Increased urinary frequency 07/12/2016  . Rhinitis, allergic 09/09/2014    Past Surgical History:  Procedure Laterality Date  . INTRAUTERINE DEVICE (IUD) INSERTION  04/2017   Mirena   . KNEE ARTHROSCOPY Right     OB History    Gravida  1   Para  1   Term  1   Preterm      AB      Living  1     SAB      IAB      Ectopic      Multiple      Live Births  1            Home Medications    Prior to Admission medications   Medication Sig Start Date End Date Taking? Authorizing Provider  ondansetron (ZOFRAN ODT) 4 MG disintegrating tablet Take 1 tablet (4 mg total) by mouth every 8 (eight) hours as needed for nausea or vomiting. 11/06/20  Yes Zac Torti, Britta Mccreedy, MD  escitalopram (LEXAPRO) 20 MG tablet Take 1 tablet (20 mg total) by mouth at bedtime. 06/12/20   [name], Bonna Gains, NP    Family History Family History  Problem Relation Age of Onset  . Diabetes Father   . Arthritis Father   . Depression Father   .  Mental illness Father        PTSD, anxiety  . Hypertension Father   . Hyperlipidemia Father   . Anxiety disorder Father   . Bipolar disorder Father   . Sleep apnea Father   . Alcoholism Father   . Obesity Father   . Arthritis Mother        osteoartthritis  . Fibroids Mother   . Ovarian cysts Mother   . Obesity Mother   . Kidney disease Maternal Grandmother   . Arthritis Maternal Grandmother   . Prostate cancer Maternal Grandfather   . Arthritis Maternal Grandfather   . Arthritis Paternal Grandmother   . Diabetes Paternal Grandfather   . Mental illness Paternal Grandfather   . Arthritis Paternal Grandfather     Social History Social History   Tobacco Use  . Smoking status: Never Smoker  . Smokeless tobacco: Never Used  Vaping Use  . Vaping Use: Never used  Substance Use Topics  . Alcohol use: No    Alcohol/week: 0.0 standard drinks  . Drug  use: No     Allergies   Patient has no known allergies.   Review of Systems Review of Systems  HENT: Negative.   Respiratory: Negative.   Gastrointestinal: Positive for abdominal pain, nausea and vomiting. Negative for constipation and diarrhea.  Genitourinary: Negative.   Musculoskeletal: Negative.   Neurological: Negative.      Physical Exam Triage Vital Signs ED Triage Vitals  Enc Vitals Group     BP 11/06/20 1736 117/72     Pulse Rate 11/06/20 1736 87     Resp --      Temp 11/06/20 1736 98.7 F (37.1 C)     Temp Source 11/06/20 1736 Oral     SpO2 11/06/20 1736 99 %     Weight --      Height --      Head Circumference --      Peak Flow --      Pain Score 11/06/20 1734 3     Pain Loc --      Pain Edu? --      Excl. in GC? --    No data found.  Updated Vital Signs BP 117/72 (BP Location: Right Arm)   Pulse 87   Temp 98.7 F (37.1 C) (Oral)   LMP 10/31/2020   SpO2 99%   Visual Acuity Right Eye Distance:   Left Eye Distance:   Bilateral Distance:    Right Eye Near:   Left Eye Near:    Bilateral Near:     Physical Exam Vitals and nursing note reviewed.  Constitutional:      General: She is not in acute distress.    Appearance: She is not ill-appearing.  Cardiovascular:     Rate and Rhythm: Normal rate and regular rhythm.  Pulmonary:     Effort: Pulmonary effort is normal.     Breath sounds: Normal breath sounds.  Abdominal:     General: Abdomen is flat and scaphoid. Bowel sounds are normal. There is no distension.     Palpations: Abdomen is soft.     Tenderness: There is no abdominal tenderness.     Hernia: No hernia is present.  Neurological:     Mental Status: She is alert.      UC Treatments / Results  Labs (all labs ordered are listed, but only abnormal results are displayed) Labs Reviewed  COMPREHENSIVE METABOLIC PANEL  CBC WITH DIFFERENTIAL/PLATELET  VITAMIN D 25 HYDROXY (VIT  D DEFICIENCY, FRACTURES)  POC URINE PREG, ED     EKG   Radiology No results found.  Procedures Procedures (including critical care time)  Medications Ordered in UC Medications - No data to display  Initial Impression / Assessment and Plan / UC Course  I have reviewed the triage vital signs and the nursing notes.  Pertinent labs & imaging results that were available during my care of the patient were reviewed by me and considered in my medical decision making (see chart for details).     1.  Lower abdominal pain in the setting of GERD symptoms: CBC, CMP Urine pregnancy is negative We will call patient with labs if abnormal Zofran as needed for nausea/vomiting Return to urgent care if symptoms worsen. Final Clinical Impressions(s) / UC Diagnoses   Final diagnoses:  Lower abdominal pain     Discharge Instructions     Please buy over-the-counter famotidine Take medications as prescribed We will call you with lab results if abnormal Your urine pregnancy test is negative.   ED Prescriptions    Medication Sig Dispense Auth. Provider   ondansetron (ZOFRAN ODT) 4 MG disintegrating tablet Take 1 tablet (4 mg total) by mouth every 8 (eight) hours as needed for nausea or vomiting. 20 tablet Naoma Boxell, Britta Mccreedy, MD     PDMP not reviewed this encounter.   Merrilee Jansky, MD 11/06/20 [phone]

## 2020-11-06 NOTE — ED Triage Notes (Signed)
Pt presents with intermittent lower abdominal pain, nausea, and dizziness for past few days.

## 2020-11-25 ENCOUNTER — Other Ambulatory Visit: Payer: Self-pay

## 2020-11-25 ENCOUNTER — Ambulatory Visit: Payer: Managed Care, Other (non HMO) | Admitting: Obstetrics and Gynecology

## 2020-11-25 ENCOUNTER — Encounter: Payer: Self-pay | Admitting: Obstetrics and Gynecology

## 2020-11-25 VITALS — BP 156/98 | HR 101

## 2020-11-25 DIAGNOSIS — R112 Nausea with vomiting, unspecified: Secondary | ICD-10-CM | POA: Diagnosis not present

## 2020-11-25 DIAGNOSIS — R1033 Periumbilical pain: Secondary | ICD-10-CM

## 2020-11-25 DIAGNOSIS — N644 Mastodynia: Secondary | ICD-10-CM

## 2020-11-25 DIAGNOSIS — K30 Functional dyspepsia: Secondary | ICD-10-CM

## 2020-11-25 DIAGNOSIS — R5383 Other fatigue: Secondary | ICD-10-CM | POA: Diagnosis not present

## 2020-11-25 DIAGNOSIS — Z8619 Personal history of other infectious and parasitic diseases: Secondary | ICD-10-CM

## 2020-11-25 DIAGNOSIS — R4586 Emotional lability: Secondary | ICD-10-CM

## 2020-11-25 LAB — PREGNANCY, URINE: Preg Test, Ur: NEGATIVE

## 2020-11-25 NOTE — Progress Notes (Signed)
GYNECOLOGY  VISIT   HPI: 30 y.o.   Married White or Caucasian Not Hispanic or Latino  female   G1P1001 with Patient's last menstrual period was 10/31/2020.   here for  pregnancy test  She had her mirena IUD removed on 10/14/20, trying to get pregnant. She had a normal period on 10/31/20.  She c/o a 2 week h/o very tender nipples, nausea/vomiting, indigestion, fatigue. Sleeping 14-15 hours on her days off from work. Sexually active several times a week. She has had some mild constipation then loose stool. She is having some periumbilical discomfort/cramping.  She has been having random sharp pain in either lower lateral part of her abdomen. She has had an increase in gas.    No ectopic risk factors.  She was treated for trich on 10/14/20. Partner also treated.   GYNECOLOGIC HISTORY: Patient's last menstrual period was 10/31/2020. Contraception:none Menopausal hormone therapy: none        OB History     Gravida  1   Para  1   Term  1   Preterm      AB      Living  1      SAB      IAB      Ectopic      Multiple      Live Births  1              Patient Active Problem List   Diagnosis Date Noted   Backache 09/09/2020   Dysuria 09/09/2020   Excessive or frequent menstruation 09/09/2020   Neck pain 09/09/2020   Radial styloid tenosynovitis 09/09/2020   Other hyperlipidemia 12/12/2019   Insulin resistance 11/25/2019   Vitamin D deficiency 11/25/2019   Piriformis syndrome of right side 11/11/2019   Cough variant asthma 09/26/2019   Excessive daytime sleepiness 03/13/2019   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   Acute right ankle pain 10/24/2018   Class 3 severe obesity with serious comorbidity and body mass index (BMI) of 40.0 to 44.9 in adult [facility]) 08/24/2018   GAD (generalized anxiety disorder) 08/24/2018   Acute pain of right knee 07/23/2018   Chronic right-sided low back pain with right-sided sciatica 05/15/2018   Depression, major, single episode, in  partial remission (HCC) 09/05/2017   Migraine without aura    Arthritis    Allergy    Increased bowel frequency 07/12/2016   Increased urinary frequency 07/12/2016   Rhinitis, allergic 09/09/2014    Past Medical History:  Diagnosis Date   ADHD    Allergy    Anxiety    Anxiety    Arthritis    Back pain    Bilateral swelling of feet    Chronic fatigue syndrome    Constipation    Depression    IBS (irritable bowel syndrome)    Joint pain    Lactose intolerance    Migraine without aura     Past Surgical History:  Procedure Laterality Date   INTRAUTERINE DEVICE (IUD) INSERTION  04/2017   Mirena    KNEE ARTHROSCOPY Right     Current Outpatient Medications  Medication Sig Dispense Refill   escitalopram (LEXAPRO) 20 MG tablet Take 1 tablet (20 mg total) by mouth at bedtime. 90 tablet 3   Prenatal Vit-Fe Fumarate-FA (PRENATAL VITAMIN PO) Take by mouth.     VITAMIN D PO Take by mouth.     ondansetron (ZOFRAN ODT) 4 MG disintegrating tablet Take 1 tablet (4 mg total) by mouth every  8 (eight) hours as needed for nausea or vomiting. (Patient not taking: Reported on 11/25/2020) 20 tablet 0   No current facility-administered medications for this visit.     ALLERGIES: Patient has no known allergies.  Family History  Problem Relation Age of Onset   Diabetes Father    Arthritis Father    Depression Father    Mental illness Father        PTSD, anxiety   Hypertension Father    Hyperlipidemia Father    Anxiety disorder Father    Bipolar disorder Father    Sleep apnea Father    Alcoholism Father    Obesity Father    Arthritis Mother        osteoartthritis   Fibroids Mother    Ovarian cysts Mother    Obesity Mother    Kidney disease Maternal Grandmother    Arthritis Maternal Grandmother    Prostate cancer Maternal Grandfather    Arthritis Maternal Grandfather    Arthritis Paternal Grandmother    Diabetes Paternal Grandfather    Mental illness Paternal Grandfather     Arthritis Paternal Grandfather     Social History   Socioeconomic History   Marital status: Married    Spouse name: Not on file   Number of children: Not on file   Years of education: Not on file   Highest education level: Not on file  Occupational History   Not on file  Tobacco Use   Smoking status: Never   Smokeless tobacco: Never  Vaping Use   Vaping Use: Never used  Substance and Sexual Activity   Alcohol use: No    Alcohol/week: 0.0 standard drinks   Drug use: No   Sexual activity: Yes    Partners: Male    Birth control/protection: I.U.D.  Other Topics Concern   Not on file  Social History Narrative   Not on file   Social Determinants of Health   Financial Resource Strain: Not on file  Food Insecurity: Not on file  Transportation Needs: Not on file  Physical Activity: Not on file  Stress: Not on file  Social Connections: Not on file  Intimate Partner Violence: Not on file    ROS: see above  PHYSICAL EXAMINATION:    BP (!) 156/98 (BP Location: Right Arm, Patient Position: Sitting, Cuff Size: Large)   Pulse (!) 101   LMP 10/31/2020   SpO2 98%     General appearance: alert, cooperative and appears stated age Neck: no adenopathy, supple, symmetrical, trachea midline and thyroid normal to inspection and palpation Abdomen: soft, mildly tender BLQ; non distended, no masses,  no organomegaly  Pelvic: External genitalia:  no lesions              Urethra:  normal appearing urethra with no masses, tenderness or lesions              Bartholins and Skenes: normal                 Vagina: normal appearing vagina with normal color and discharge, no lesions              Cervix: no cervical motion tenderness and no lesions              Bimanual Exam:  Uterus:  normal size, contour, position, consistency, mobility, non-tender and anteverted              Adnexa:  no masses, bilaterally tender  Chaperone was present for exam.  1. Breast tenderness Extreme  nipple tenderness - Pregnancy, urine (negative), trying to get pregnant - B-HCG Quant  2. Other fatigue  - CBC - TSH - B-HCG Quant  3. Non-intractable vomiting with nausea, unspecified vomiting type  - Comprehensive metabolic panel - B-HCG Quant  4. Indigestion   5. Periumbilical abdominal pain She doesn't have a surgical abdomen - CBC  6. Mood changes   7. History of trichomonal vaginitis Treated over a month ago.  - SURESWAB CT/NG/T. vaginalis

## 2020-11-26 ENCOUNTER — Encounter: Payer: Self-pay | Admitting: Obstetrics and Gynecology

## 2020-11-26 LAB — CBC
HCT: 39.9 % (ref 35.0–45.0)
Hemoglobin: 13.2 g/dL (ref 11.7–15.5)
MCH: 30.4 pg (ref 27.0–33.0)
MCHC: 33.1 g/dL (ref 32.0–36.0)
MCV: 91.9 fL (ref 80.0–100.0)
MPV: 12 fL (ref 7.5–12.5)
Platelets: 200 10*3/uL (ref 140–400)
RBC: 4.34 10*6/uL (ref 3.80–5.10)
RDW: 12.5 % (ref 11.0–15.0)
WBC: 9 10*3/uL (ref 3.8–10.8)

## 2020-11-26 LAB — COMPREHENSIVE METABOLIC PANEL
AG Ratio: 1.6 (calc) (ref 1.0–2.5)
ALT: 17 U/L (ref 6–29)
AST: 17 U/L (ref 10–30)
Albumin: 4.2 g/dL (ref 3.6–5.1)
Alkaline phosphatase (APISO): 61 U/L (ref 31–125)
BUN: 15 mg/dL (ref 7–25)
CO2: 21 mmol/L (ref 20–32)
Calcium: 9.1 mg/dL (ref 8.6–10.2)
Chloride: 106 mmol/L (ref 98–110)
Creat: 0.84 mg/dL (ref 0.50–1.10)
Globulin: 2.6 g/dL (calc) (ref 1.9–3.7)
Glucose, Bld: 84 mg/dL (ref 65–99)
Potassium: 4.1 mmol/L (ref 3.5–5.3)
Sodium: 138 mmol/L (ref 135–146)
Total Bilirubin: 0.2 mg/dL (ref 0.2–1.2)
Total Protein: 6.8 g/dL (ref 6.1–8.1)

## 2020-11-26 LAB — TSH: TSH: 1.6 mIU/L

## 2020-11-26 LAB — SURESWAB CT/NG/T. VAGINALIS
C. trachomatis RNA, TMA: NOT DETECTED
N. gonorrhoeae RNA, TMA: NOT DETECTED
Trichomonas vaginalis RNA: NOT DETECTED

## 2020-11-26 LAB — HCG, QUANTITATIVE, PREGNANCY: HCG, Total, QN: 16 m[IU]/mL

## 2020-12-03 ENCOUNTER — Telehealth: Payer: Self-pay | Admitting: *Deleted

## 2020-12-03 NOTE — Telephone Encounter (Signed)
Patient was informed she is early pregnant on 11/25/20. Patient works as a Biochemist, clinical and asked if a note could be provided stating her estimated due date also included in the note that is can't wear her duty belt due to the weight of the belt.  She has OB appointment scheduled with [facility] on 12/25/20. Please advise

## 2020-12-03 NOTE — Telephone Encounter (Signed)
She is so early in her pregnancy that wearing her duty belt shouldn't be an issue for some time. Certainly not an issue in the first trimester.  Based on her LMP on 10/31/20 her EDD is 08/07/21.  I'm sure she will get an ultrasound with her OB provider who will confirm dates for her.

## 2020-12-04 NOTE — Telephone Encounter (Signed)
I informed patient with all the below. She reports she stands 12-13 hours a day and the duty belt weights at least 10-12 pounds she which is very uncomfortable around her waist for a long period of time. Patient said she feel very bloated/gas and breast feel heavy. She told her supervisor and was told to get a note saying okay for patient not to wear her unform due to pregnancy discomfort. Patient said she can't work this way.   I did tell her you are out of the office today and the office is closed for July 4th holiday on Monday.  Please advise

## 2020-12-04 NOTE — Telephone Encounter (Signed)
Left message for patient to call.

## 2020-12-09 ENCOUNTER — Encounter: Payer: Self-pay | Admitting: *Deleted

## 2020-12-09 NOTE — Telephone Encounter (Signed)
Please give her a note stating that bloating caused by her pregnancy is making her duty belt very uncomfortable. Please excuse her from wearing the belt.

## 2020-12-09 NOTE — Telephone Encounter (Signed)
Letter printed and left at appointments for pick up. Message on patient voicemail this has been done.

## 2020-12-11 ENCOUNTER — Encounter: Payer: Self-pay | Admitting: Family Medicine

## 2020-12-11 ENCOUNTER — Other Ambulatory Visit: Payer: Self-pay

## 2020-12-11 ENCOUNTER — Ambulatory Visit: Payer: Managed Care, Other (non HMO) | Admitting: Family Medicine

## 2020-12-11 VITALS — BP 118/76 | HR 82 | Temp 97.7°F | Ht 67.0 in | Wt 300.8 lb

## 2020-12-11 DIAGNOSIS — Z3A01 Less than 8 weeks gestation of pregnancy: Secondary | ICD-10-CM | POA: Insufficient documentation

## 2020-12-11 DIAGNOSIS — F411 Generalized anxiety disorder: Secondary | ICD-10-CM

## 2020-12-11 NOTE — Progress Notes (Signed)
[facility] [address] [place] [address] Dept: [phone] Dept Fax: [phone]  Office Visit  Subjective:    Patient ID: Carmen Wall, female    DOB: 08-16-1990, 30 y.o..   MRN: [id number]  Chief Complaint  Patient presents with   Follow-up    Needs a note for work stating that she can't wear her uniform due to being [redacted] weeks pregnant.    History of Present Illness:  Patient is in today requesting  a note for work related to accommodations on her job related to her pregnancy. Carmen Wall is G2P0 with a LMP= 10/31/2020 and an EDD= 08/06/2021. She is currently 5 weeks and 6/7 gestation.   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 She typically wears a uniform, which includes a belt for equipment. She notes that she is having breast sensitivity/tenderness and occasional shortness of breath and feels the tightness of her uniform shirt is worsening the symptoms. As well, she is noting abdominal bloating and feels her uniform pants and equipment belt are constricting and uncomfortable. Carmen Wall admits she is also having some anxiety related to being around violent criminals in light of her pregnancy. She notes that if she has a physician's note, she can be allowed to not wear her uniform and to be placed in other duty locations during her pregnancy.  Carmen Wall has a history of depression and anxiety. She notes that she stopped taking her Lexapro, out of concern for being on this during pregnancy. She is finding that she is having some emotional lability, though she denies any specific depressed mood or overall anxious feelings.  Carmen Wall has an appointment with an obstetrician, but this is still a month or more away.  Past Medical History: Patient Active Problem List   Diagnosis Date Noted   Less than [redacted] weeks gestation of pregnancy 12/11/2020   Backache 09/09/2020   Dysuria 09/09/2020    Excessive or frequent menstruation 09/09/2020   Neck pain 09/09/2020   Radial styloid tenosynovitis 09/09/2020   Other hyperlipidemia 12/12/2019   Insulin resistance 11/25/2019   Vitamin D deficiency 11/25/2019   Piriformis syndrome of right side 11/11/2019   Cough variant asthma 09/26/2019   Excessive daytime sleepiness 03/13/2019   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   Acute right ankle pain 10/24/2018   Class 3 severe obesity with serious comorbidity and body mass index (BMI) of 40.0 to 44.9 in adult (HCC) 08/24/2018   GAD (generalized anxiety disorder) 08/24/2018   Acute pain of right knee 07/23/2018   Chronic right-sided low back pain with right-sided sciatica 05/15/2018   Depression, major, single episode, 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/05/2017   Migraine without aura    Arthritis    Allergy    Increased bowel frequency 07/12/2016   Increased urinary frequency 07/12/2016   Rhinitis, allergic 09/09/2014    Past Surgical History:  Procedure Laterality Date   INTRAUTERINE DEVICE (IUD) INSERTION  04/2017   Mirena    KNEE ARTHROSCOPY Right     Family History  Problem Relation Age of Onset   Diabetes Father    Arthritis Father    Depression Father    Mental illness Father        PTSD, anxiety   Hypertension Father    Hyperlipidemia Father    Anxiety disorder Father    Bipolar disorder Father    Sleep apnea Father    Alcoholism Father  Obesity Father    Arthritis Mother        osteoartthritis   Fibroids Mother    Ovarian cysts Mother    Obesity Mother    Kidney disease Maternal Grandmother    Arthritis Maternal Grandmother    Prostate cancer Maternal Grandfather    Arthritis Maternal Grandfather    Arthritis Paternal Grandmother    Diabetes Paternal Grandfather    Mental illness Paternal Grandfather    Arthritis Paternal Grandfather    Outpatient Medications Prior to Visit  Medication Sig Dispense Refill   albuterol (VENTOLIN HFA) 108 (90 Base) MCG/ACT  inhaler Inhale into the lungs every 6 (six) hours as needed for wheezing or shortness of breath.     Prenatal Vit-Fe Fumarate-FA (PRENATAL VITAMIN PO) Take by mouth.     VITAMIN D PO Take by mouth.     escitalopram (LEXAPRO) 20 MG tablet Take 1 tablet (20 mg total) by mouth at bedtime. (Patient not taking: Reported on 12/11/2020) 90 tablet 3   ondansetron (ZOFRAN ODT) 4 MG disintegrating tablet Take 1 tablet (4 mg total) by mouth every 8 (eight) hours as needed for nausea or vomiting. (Patient not taking: No sig reported) 20 tablet 0   No facility-administered medications prior to visit.   No Known Allergies    Objective:   Today's Vitals   12/11/20 0932  BP: 118/76  Pulse: 82  Temp: 97.7 F (36.5 C)  TempSrc: Temporal  SpO2: 99%  Weight: (!) 300 lb 12.8 oz (136.4 kg)  Height: 5\' 7"  (1.702 m)   Body mass index is 47.11 kg/m.   General: Well developed, well nourished. No acute distress. Psych: Alert and oriented. Mood appears mildly anxious. Some tearfulness.  Health Maintenance Due  Topic Date Due   COVID-19 Vaccine (3 - Booster for Moderna series) 01/05/2020     Assessment & Plan:   1. Less than [redacted] weeks gestation of pregnancy I provided Ms. 03/06/2020 with a note for work requesting accomodation for not wearing her uniform and alternative work duties for the duration of her pregnancy.  2. GAD (generalized anxiety disorder) I suspect Carmen Wall is having some increased anxiety related to both stopping her medication, hormonal influences form her pregnancy, and the real worry about risks to her fetus related to the normal risks of her job. We have discussed that the benefits of an SSRI may outweigh risks, if her anxiety/depression symptoms start to become excessive. She will monitor this for now, but reach out if these become more bothersome.  [place], MD

## 2020-12-25 LAB — OB RESULTS CONSOLE GC/CHLAMYDIA
Chlamydia: NEGATIVE
Gonorrhea: NEGATIVE

## 2021-01-22 LAB — OB RESULTS CONSOLE ANTIBODY SCREEN: Antibody Screen: NEGATIVE

## 2021-01-22 LAB — OB RESULTS CONSOLE ABO/RH: RH Type: POSITIVE

## 2021-01-22 LAB — OB RESULTS CONSOLE HIV ANTIBODY (ROUTINE TESTING): HIV: NONREACTIVE

## 2021-01-22 LAB — OB RESULTS CONSOLE HEPATITIS B SURFACE ANTIGEN: Hepatitis B Surface Ag: NEGATIVE

## 2021-01-22 LAB — OB RESULTS CONSOLE RUBELLA ANTIBODY, IGM: Rubella: IMMUNE

## 2021-01-22 LAB — OB RESULTS CONSOLE RPR: RPR: NONREACTIVE

## 2021-01-25 ENCOUNTER — Other Ambulatory Visit: Payer: Self-pay | Admitting: Obstetrics

## 2021-01-25 DIAGNOSIS — Z363 Encounter for antenatal screening for malformations: Secondary | ICD-10-CM

## 2021-03-04 ENCOUNTER — Encounter (HOSPITAL_BASED_OUTPATIENT_CLINIC_OR_DEPARTMENT_OTHER): Payer: Self-pay

## 2021-03-04 ENCOUNTER — Other Ambulatory Visit: Payer: Self-pay

## 2021-03-04 ENCOUNTER — Emergency Department (HOSPITAL_COMMUNITY): Payer: Managed Care, Other (non HMO)

## 2021-03-04 ENCOUNTER — Emergency Department (HOSPITAL_BASED_OUTPATIENT_CLINIC_OR_DEPARTMENT_OTHER)
Admission: EM | Admit: 2021-03-04 | Discharge: 2021-03-04 | Disposition: A | Discharge status: Home / Self Care | Payer: Managed Care, Other (non HMO) | Attending: Emergency Medicine | Admitting: Emergency Medicine

## 2021-03-04 DIAGNOSIS — R519 Headache, unspecified: Secondary | ICD-10-CM | POA: Diagnosis not present

## 2021-03-04 DIAGNOSIS — R202 Paresthesia of skin: Secondary | ICD-10-CM | POA: Diagnosis not present

## 2021-03-04 DIAGNOSIS — O26892 Other specified pregnancy related conditions, second trimester: Secondary | ICD-10-CM | POA: Insufficient documentation

## 2021-03-04 DIAGNOSIS — R200 Anesthesia of skin: Secondary | ICD-10-CM

## 2021-03-04 DIAGNOSIS — M542 Cervicalgia: Secondary | ICD-10-CM | POA: Diagnosis not present

## 2021-03-04 DIAGNOSIS — Z3A18 18 weeks gestation of pregnancy: Secondary | ICD-10-CM | POA: Diagnosis not present

## 2021-03-04 LAB — COMPREHENSIVE METABOLIC PANEL
ALT: 20 U/L (ref 0–44)
AST: 24 U/L (ref 15–41)
Albumin: 3.1 g/dL — ABNORMAL LOW (ref 3.5–5.0)
Alkaline Phosphatase: 55 U/L (ref 38–126)
Anion gap: 7 (ref 5–15)
BUN: 6 mg/dL (ref 6–20)
CO2: 21 mmol/L — ABNORMAL LOW (ref 22–32)
Calcium: 9 mg/dL (ref 8.9–10.3)
Chloride: 107 mmol/L (ref 98–111)
Creatinine, Ser: 0.58 mg/dL (ref 0.44–1.00)
GFR, Estimated: >60 mL/min (ref >60)
Glucose, Bld: 125 mg/dL — ABNORMAL HIGH (ref 70–99)
Potassium: 3.8 mmol/L (ref 3.5–5.1)
Sodium: 135 mmol/L (ref 135–145)
Total Bilirubin: 0.2 mg/dL — ABNORMAL LOW (ref 0.3–1.2)
Total Protein: 6.5 g/dL (ref 6.5–8.1)

## 2021-03-04 LAB — CBC WITH DIFFERENTIAL/PLATELET
Abs Immature Granulocytes: 0.04 10*3/uL (ref 0.00–0.07)
Basophils Absolute: 0 10*3/uL (ref 0.0–0.1)
Basophils Relative: 0 %
Eosinophils Absolute: 0.1 10*3/uL (ref 0.0–0.5)
Eosinophils Relative: 1 %
HCT: 37.5 % (ref 36.0–46.0)
Hemoglobin: 13 g/dL (ref 12.0–15.0)
Immature Granulocytes: 1 %
Lymphocytes Relative: 17 %
Lymphs Abs: 1.2 10*3/uL (ref 0.7–4.0)
MCH: 31.3 pg (ref 26.0–34.0)
MCHC: 34.7 g/dL (ref 30.0–36.0)
MCV: 90.4 fL (ref 80.0–100.0)
Monocytes Absolute: 0.4 10*3/uL (ref 0.1–1.0)
Monocytes Relative: 6 %
Neutro Abs: 5.1 10*3/uL (ref 1.7–7.7)
Neutrophils Relative %: 75 %
Platelets: 161 10*3/uL (ref 150–400)
RBC: 4.15 MIL/uL (ref 3.87–5.11)
RDW: 13.2 % (ref 11.5–15.5)
WBC: 6.8 10*3/uL (ref 4.0–10.5)
nRBC: 0 % (ref 0.0–0.2)

## 2021-03-04 LAB — MAGNESIUM: Magnesium: 2 mg/dL (ref 1.7–2.4)

## 2021-03-04 NOTE — ED Provider Notes (Signed)
[facility] EMERGENCY DEPARTMENT Provider Note   CSN: [id number] Arrival date & time: 03/04/21  1041     History Chief Complaint  Patient presents with   Numbness    Carmen Wall is a 30 y.o. female.  The history is provided by the patient and medical records. No language interpreter was used.  Neurologic Problem This is a new problem. The current episode started more than 1 week ago. The problem occurs constantly. The problem has been rapidly worsening. Associated symptoms include headaches. Pertinent negatives include no chest pain, no abdominal pain and no shortness of breath. Nothing aggravates the symptoms. Nothing relieves the symptoms. She has tried nothing for the symptoms. The treatment provided no relief.      Past Medical History:  Diagnosis Date   ADHD    Allergy    Anxiety    Anxiety    Arthritis    Back pain    Bilateral swelling of feet    Chronic fatigue syndrome    Constipation    Depression    IBS (irritable bowel syndrome)    Joint pain    Lactose intolerance    Migraine without aura     Patient Active Problem List   Diagnosis Date Noted   Less than [redacted] weeks gestation of pregnancy 12/11/2020   Backache 09/09/2020   Dysuria 09/09/2020   Excessive or frequent menstruation 09/09/2020   Neck pain 09/09/2020   Radial styloid tenosynovitis 09/09/2020   Other hyperlipidemia 12/12/2019   Insulin resistance 11/25/2019   Vitamin D deficiency 11/25/2019   Piriformis syndrome of right side 11/11/2019   Cough variant asthma 09/26/2019   Excessive daytime sleepiness 03/13/2019   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   Acute right ankle pain 10/24/2018   Class 3 severe obesity with serious comorbidity and body mass index (BMI) of 40.0 to 44.9 in adult (HCC) 08/24/2018   GAD (generalized anxiety disorder) 08/24/2018   Acute pain of right knee 07/23/2018   Chronic right-sided low back pain with right-sided sciatica 05/15/2018    Depression, major, single episode, 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/05/2017   Migraine without aura    Arthritis    Allergy    Increased bowel frequency 07/12/2016   Increased urinary frequency 07/12/2016   Rhinitis, allergic 09/09/2014    Past Surgical History:  Procedure Laterality Date   INTRAUTERINE DEVICE (IUD) INSERTION  04/2017   Mirena    KNEE ARTHROSCOPY Right      OB History     Gravida  2   Para  1   Term  1   Preterm      AB      Living  1      SAB      IAB      Ectopic      Multiple      Live Births  1           Family History  Problem Relation Age of Onset   Diabetes Father    Arthritis Father    Depression Father    Mental illness Father        PTSD, anxiety   Hypertension Father    Hyperlipidemia Father    Anxiety disorder Father    Bipolar disorder Father    Sleep apnea Father    Alcoholism Father    Obesity Father    Arthritis Mother        osteoartthritis   Fibroids Mother    Ovarian  cysts Mother    Obesity Mother    Kidney disease Maternal Grandmother    Arthritis Maternal Grandmother    Prostate cancer Maternal Grandfather    Arthritis Maternal Grandfather    Arthritis Paternal Grandmother    Diabetes Paternal Grandfather    Mental illness Paternal Grandfather    Arthritis Paternal Grandfather     Social History   Tobacco Use   Smoking status: Never   Smokeless tobacco: Never  Vaping Use   Vaping Use: Never used  Substance Use Topics   Alcohol use: No    Alcohol/week: 0.0 standard drinks   Drug use: No    Home Medications Prior to Admission medications   Medication Sig Start Date End Date Taking? Authorizing Provider  albuterol (VENTOLIN HFA) 108 (90 Base) MCG/ACT inhaler Inhale into the lungs every 6 (six) hours as needed for wheezing or shortness of breath.    [provider]  escitalopram (LEXAPRO) 10 MG tablet Take 10 mg by mouth daily. 01/22/21   [provider]  Prenatal Vit-Fe  Fumarate-FA (PRENATAL VITAMIN PO) Take by mouth.    [provider]  VITAMIN D PO Take by mouth.    [provider]    Allergies    Patient has no known allergies.  Review of Systems   Review of Systems  Constitutional:  Negative for chills, diaphoresis, fatigue and fever.  HENT:  Negative for congestion.   Eyes:  Negative for visual disturbance.  Respiratory:  Negative for cough, chest tightness and shortness of breath.   Cardiovascular:  Negative for chest pain.  Gastrointestinal:  Negative for abdominal pain, constipation, diarrhea, nausea and vomiting.  Genitourinary:  Negative for dysuria, flank pain and frequency.  Musculoskeletal:  Positive for neck pain. Negative for back pain and neck stiffness.  Skin:  Negative for rash and wound.  Neurological:  Positive for numbness and headaches. Negative for dizziness, tremors, syncope, weakness and light-headedness.  Psychiatric/Behavioral:  Negative for agitation.   All other systems reviewed and are negative.  Physical Exam Updated Vital Signs BP (!) 129/97 (BP Location: Right Arm)   Pulse 82   Temp 98.4 F (36.9 C) (Oral)   Resp 18   Ht 5\' 7"  (1.702 m)   Wt (!) 140.6 kg   LMP 10/31/2020 (Exact Date)   SpO2 100%   BMI 48.55 kg/m   Physical Exam Vitals and nursing note reviewed.  Constitutional:      General: She is not in acute distress.    Appearance: She is well-developed. She is not ill-appearing, toxic-appearing or diaphoretic.  HENT:     Head: Normocephalic and atraumatic.     Nose: No congestion or rhinorrhea.     Mouth/Throat:     Mouth: Mucous membranes are moist.     Pharynx: No oropharyngeal exudate or posterior oropharyngeal erythema.  Eyes:     Extraocular Movements: Extraocular movements intact.     Conjunctiva/sclera: Conjunctivae normal.     Pupils: Pupils are equal, round, and reactive to light.  Cardiovascular:     Rate and Rhythm: Normal rate and regular rhythm.     Heart  sounds: No murmur heard. Pulmonary:     Effort: Pulmonary effort is normal. No respiratory distress.     Breath sounds: Normal breath sounds. No wheezing, rhonchi or rales.  Chest:     Chest wall: No tenderness.  Abdominal:     General: Abdomen is flat. There is no distension.     Palpations: Abdomen is soft.  Tenderness: There is no abdominal tenderness. There is no guarding or rebound.  Musculoskeletal:        General: Tenderness present.     Cervical back: Neck supple. Tenderness present.     Right lower leg: No edema.     Left lower leg: No edema.  Skin:    General: Skin is warm and dry.     Capillary Refill: Capillary refill takes less than 2 seconds.     Findings: No erythema or rash.  Neurological:     Mental Status: She is alert and oriented to person, place, and time.     Cranial Nerves: No cranial nerve deficit.     Sensory: Sensory deficit present.     Motor: No weakness.     Coordination: Coordination normal.     Comments: Decreased temperature sensation in left arm compared to right arm.  No tenderness appreciated.  Intact grip strength.  Symmetric smile.  Clear speech.  Pupil symmetric reactive normal extraocular movements.  Normal finger-nose-finger testing.  No carotid bruit appreciated.  No nystagmus.  Hoffmann negative in the left hand.  Psychiatric:        Mood and Affect: Mood normal.    ED Results / Procedures / Treatments   Labs (all labs ordered are listed, but only abnormal results are displayed) Labs Reviewed  COMPREHENSIVE METABOLIC PANEL - Abnormal; Notable for the following components:      Result Value   CO2 21 (*)    Glucose, Bld 125 (*)    Albumin 3.1 (*)    Total Bilirubin 0.2 (*)    All other components within normal limits  CBC WITH DIFFERENTIAL/PLATELET  MAGNESIUM    EKG None  Radiology No results found.  Procedures Procedures   Medications Ordered in ED Medications - No data to display  ED Course  I have reviewed the  triage vital signs and the nursing notes.  Pertinent labs & imaging results that were available during my care of the patient were reviewed by me and considered in my medical decision making (see chart for details).    MDM Rules/Calculators/A&P                           Carmen Wall is a 30 y.o. female who reports a history of remote migraines, asthma, anxiety, chronic fatigue, and 18 weeks of pregnancy who presents with new neurologic complaints and headaches.  Patient reports that for the last month or so she has had left hand numbness.  She reports that over the last few days she has had more numbness and tingling and pins and needle sensation in the entire left arm.  She reports that the temperature sensation seems to be off.  She also reports that she has been having headaches for the last month in her right neck going into her head that wraps all the way across to the front of her head.  She describes it as moderate to severe.  She reports it feels different than previous migraines.  She denies vision changes, speech difficulties, or dizziness.  Denies any difficulty with ambulation.  Denies any trauma.  Denies any history of dural venous sinus thrombosis, stroke, arterial dissections, or MS.  She denies any fevers, chills, ingestion, cough, nausea, vomiting, constipation, diarrhea, urinary symptoms.  Denies any complications with her pregnancy.  On exam, lungs are clear and chest is nontender.  Abdomen is nontender.  Back and flanks nontender.  No  rashes seen.  Good pulses in all extremities.  Normal strength in extremities, normal sensation in the legs.  Normal sensation of the face.  Symmetric smile.  Pupil symmetric and reactive normal extract movements.  Patient did have decreased temperature sensation in the left arm compared to the right.  Hoffmann's test was negative for myelopathy in the left arm.  She did not have tenderness on my exam to reproduce the discomfort symptoms.  Clear  speech.  Exam otherwise unremarkable.  Clinically I am concerned that her symptoms may just be due to pregnancy causing some strange neurologic tingling sensation however, we discussed the possibility of more concerning etiologies such as MS, dissection, stroke, or dural venous sinus thrombus.  I spoke to Dr. Selina Cooley with neurology who also expressed concern for these concerning etiologies.    Dr. Selina Cooley recommended avoiding CTs due to the pregnancy and avoiding CTA but instead to order MRI brain without, MRI C-spine without, MRA head without, MRA neck without, and MRV head.  All these MRIs were ordered.  Patient will be transferred ED to ED for the MRIs.  If MRIs are negative, she will be appropriate for follow-up with her PCP/OB/GYN however if MRIs show any concerning findings or her exam worsens, neurology should be consulted at [facility].  Patient agrees with plan of care and as her symptoms have been ongoing for several days, patient will take personal vehicle to get over there.  Patient agrees to transfer and was transferred in stable condition.    Final Clinical Impression(s) / ED Diagnoses Final diagnoses:  Numbness and tingling in left arm  Neck pain  Acute nonintractable headache, unspecified headache type    Clinical Impression: 1. Numbness and tingling in left arm   2. Neck pain   3. Acute nonintractable headache, unspecified headache type     Disposition: Patient transferred for MRIs to rule out concerning etiology of her sensation abnormalities and headache.   This note was prepared with assistance of Conservation officer, historic buildings. Occasional wrong-word or sound-a-like substitutions may have occurred due to the inherent limitations of voice recognition software.     Eliyah Mcshea, Canary Brim, MD 03/04/21 1540

## 2021-03-04 NOTE — ED Triage Notes (Signed)
Pt states she is [redacted] weeks pregnant c/o paresthesia to L arm and hand since yesterday. G2P1. No complications with current pregnancy per pt. Pt noted to have equal grip strength 5/5 bilaterally.

## 2021-03-04 NOTE — ED Provider Notes (Signed)
Accepted ED to ED transfer briefly patient is a 30 year old female who is [redacted] weeks pregnant who reports a new onset problem of the left arm with significant pain, numbness and tingling of the extremity. She is G2P1. Patient reports that the problem started in the last month or so and left hand numbness.  Patient reports that over the last few days she had more numbness, tingling and pins and needle sensation for the entire left arm.  Patient also endorses some abnormality in her temperature sensation of the affected limb.  Patient reports that the problem has been worsening recently, it normally occurs when she is asleep, however has been occurring during waking hours more recently.  Patient has a history of migraines, tension type headaches in the right side of her neck and head.  Patient reports that headaches are moderate to severe.  Patient denies any vision changes, speech difficulties, or dizziness.  Patient is not had any issue with weakness, although she reports that her left arm feels heavier when she has these episodes.  Patient denies complication of pregnancy.  Patient does not have any history of MS.  Patient was evaluated, and has normal neurologic exam as can be described more clearly in Dr. Peggye Pitt note. Significant finding is some tenderness palpation of the cervical spine at midline.  Neurology was consulted and recommended in context of pregnancy to order MRI brain, C-spine, MRA head, MRA neck, and MRV head.  MRI was performed.  MRI shows no acute abnormalities.  Recommend Tylenol as needed for pain at this time.  Recommend further follow-up with neurology as needed if problem continues or worsens.  Recommend follow-up with OB/GYN for management of symptoms in context of no acute neurologic emergency.  Patient informed of plan.  Patient discharged in stable condition.  Return precautions were given.   [place] 03/04/21 1911    Benjiman Core, MD 03/04/21  2226

## 2021-03-04 NOTE — ED Notes (Signed)
Pt in MRI will get vitals once she is back

## 2021-03-04 NOTE — Discharge Instructions (Signed)
Please take Tylenol as needed for the pain.  Please talk to your OB/GYN about other medications that you might be able to take for neuropathic pain.  Please follow-up with the neurologist whose information I have attached if you have further questions.  I am glad to reassure you that you do not have a stroke or any signs of multiple sclerosis, or any other spinal cord or cervical nerve compression.

## 2021-03-04 NOTE — ED Notes (Signed)
Pt A&Ox4 ambulatory at d/c with independent steady gait. No signing pad but patient verbalized understanding of d/c instructions and follow up care.

## 2021-03-08 ENCOUNTER — Encounter: Payer: Self-pay | Admitting: *Deleted

## 2021-03-11 ENCOUNTER — Ambulatory Visit: Payer: Managed Care, Other (non HMO) | Attending: Obstetrics

## 2021-03-11 ENCOUNTER — Encounter: Payer: Self-pay | Admitting: *Deleted

## 2021-03-11 ENCOUNTER — Other Ambulatory Visit: Payer: Self-pay

## 2021-03-11 ENCOUNTER — Other Ambulatory Visit: Payer: Self-pay | Admitting: *Deleted

## 2021-03-11 ENCOUNTER — Ambulatory Visit: Payer: Managed Care, Other (non HMO) | Admitting: *Deleted

## 2021-03-11 VITALS — BP 127/56 | HR 90

## 2021-03-11 DIAGNOSIS — Z6841 Body Mass Index (BMI) 40.0 and over, adult: Secondary | ICD-10-CM

## 2021-03-11 DIAGNOSIS — Z362 Encounter for other antenatal screening follow-up: Secondary | ICD-10-CM

## 2021-03-11 DIAGNOSIS — Z363 Encounter for antenatal screening for malformations: Secondary | ICD-10-CM | POA: Insufficient documentation

## 2021-03-12 ENCOUNTER — Ambulatory Visit: Payer: Managed Care, Other (non HMO)

## 2021-04-08 ENCOUNTER — Ambulatory Visit (HOSPITAL_BASED_OUTPATIENT_CLINIC_OR_DEPARTMENT_OTHER): Payer: Managed Care, Other (non HMO) | Admitting: Obstetrics and Gynecology

## 2021-04-08 ENCOUNTER — Other Ambulatory Visit: Payer: Self-pay | Admitting: *Deleted

## 2021-04-08 ENCOUNTER — Ambulatory Visit: Payer: Managed Care, Other (non HMO) | Attending: Obstetrics and Gynecology

## 2021-04-08 ENCOUNTER — Other Ambulatory Visit: Payer: Self-pay

## 2021-04-08 ENCOUNTER — Ambulatory Visit: Payer: Managed Care, Other (non HMO) | Admitting: *Deleted

## 2021-04-08 ENCOUNTER — Encounter: Payer: Self-pay | Admitting: *Deleted

## 2021-04-08 VITALS — BP 131/69 | HR 98

## 2021-04-08 DIAGNOSIS — E669 Obesity, unspecified: Secondary | ICD-10-CM | POA: Diagnosis not present

## 2021-04-08 DIAGNOSIS — Z3A22 22 weeks gestation of pregnancy: Secondary | ICD-10-CM | POA: Insufficient documentation

## 2021-04-08 DIAGNOSIS — Z362 Encounter for other antenatal screening follow-up: Secondary | ICD-10-CM | POA: Insufficient documentation

## 2021-04-08 DIAGNOSIS — O99212 Obesity complicating pregnancy, second trimester: Secondary | ICD-10-CM

## 2021-04-08 DIAGNOSIS — O35DXX0 Maternal care for other (suspected) fetal abnormality and damage, fetal gastrointestinal anomalies, not applicable or unspecified: Secondary | ICD-10-CM

## 2021-04-08 DIAGNOSIS — Z6841 Body Mass Index (BMI) 40.0 and over, adult: Secondary | ICD-10-CM

## 2021-04-08 NOTE — Progress Notes (Signed)
Maternal-Fetal Medicine   Name: Carmen Wall [place] DOB: 07/03/1990 MRN: [id number] Referring Provider: Marlow Baars, MD  I had the [facility] of [facility]. Patient returned for completion of fetal anatomy.  Ultrasound Fetal growth is appropriate for gestational age. The estimated fetal weight is at the 11th percentile.  Amniotic fluid is normal and good fetal activity seen.  Fetal anatomical survey was completed. Echogenic bowel was seen.  No other aneuploidy markers or obvious fetal structural defects are seen. Blood pressure today at her office is 131/69 mmHg. Our concerns include: Echogenic bowel  -I discussed the finding of echogenic bowel seen on today's ultrasound. Echogenic bowel can be associated with the following:   a) Fetal aneuploidies (most commonly, Down syndrome) are seen in 3% to 5% of fetuses with echogenic bowel. Patient had opted not to screen for fetal aneuploidies. I discussed cell-free fetal DNA screening, its significance, and limitations. I recommended cell-free fetal DNA screening.  B) Fetal growth restriction.  I reassured couple of normal fetal growth assessment.   C) Fetal infection is only rarely associated with this finding. I counseled her on screening for CMV and toxoplasmosis. Patient does not have a history of fever or rashes. CMV infection is unlikely. Patient will discuss with you and screen for CMV and Toxoplasmosis. d) Cystic fibrosis (CF). Carrier screening if not already performed. e) Normal fetus. f) History of vaginal bleeding. Patient does not give history of vaginal bleeding.  Patient would like to discuss with you and decide on blood work.   I reassured the patient that in the absence of above-mentioned causes, we should expect good pregnancy outcomes. I recommend frequent fetal growth assessments to rule out fetal growth restriction.  Recommendations: -An appointment was made for her to return in 4  weeks for fetal growth assessment. -CMV, toxoplasmosis screening. -Cell-free fetal DNA screening -Cystic fibrosis carrier screening. Thank you for consultation.  If you have any questions or concerns, please contact me the [facility].  Consultation including face-to-face (more than 50%) counseling 30 minutes.

## 2021-05-06 ENCOUNTER — Ambulatory Visit: Payer: Managed Care, Other (non HMO) | Attending: Obstetrics and Gynecology

## 2021-05-06 ENCOUNTER — Encounter: Payer: Self-pay | Admitting: *Deleted

## 2021-05-06 ENCOUNTER — Ambulatory Visit: Payer: Managed Care, Other (non HMO) | Admitting: *Deleted

## 2021-05-06 ENCOUNTER — Other Ambulatory Visit: Payer: Self-pay

## 2021-05-06 ENCOUNTER — Other Ambulatory Visit: Payer: Self-pay | Admitting: *Deleted

## 2021-05-06 VITALS — BP 125/77 | HR 106

## 2021-05-06 DIAGNOSIS — O35DXX0 Maternal care for other (suspected) fetal abnormality and damage, fetal gastrointestinal anomalies, not applicable or unspecified: Secondary | ICD-10-CM

## 2021-05-06 DIAGNOSIS — O99212 Obesity complicating pregnancy, second trimester: Secondary | ICD-10-CM | POA: Diagnosis not present

## 2021-05-06 DIAGNOSIS — O99342 Other mental disorders complicating pregnancy, second trimester: Secondary | ICD-10-CM | POA: Insufficient documentation

## 2021-05-06 DIAGNOSIS — F419 Anxiety disorder, unspecified: Secondary | ICD-10-CM | POA: Insufficient documentation

## 2021-05-06 DIAGNOSIS — O283 Abnormal ultrasonic finding on antenatal screening of mother: Secondary | ICD-10-CM | POA: Insufficient documentation

## 2021-05-06 DIAGNOSIS — F32A Depression, unspecified: Secondary | ICD-10-CM | POA: Diagnosis not present

## 2021-05-06 DIAGNOSIS — E669 Obesity, unspecified: Secondary | ICD-10-CM | POA: Diagnosis not present

## 2021-05-06 DIAGNOSIS — Z6841 Body Mass Index (BMI) 40.0 and over, adult: Secondary | ICD-10-CM

## 2021-05-06 DIAGNOSIS — Z3A26 26 weeks gestation of pregnancy: Secondary | ICD-10-CM | POA: Diagnosis not present

## 2021-05-07 LAB — CMV IGM: CMV IgM Ser EIA-aCnc: <30 AU/mL (ref 0.0–29.9)

## 2021-05-07 LAB — TOXOPLASMA GONDII ANTIBODY, IGG: Toxoplasma IgG Ratio: <3 IU/mL (ref 0.0–7.1)

## 2021-05-07 LAB — TOXOPLASMA GONDII ANTIBODY, IGM: Toxoplasma Antibody- IgM: <3 AU/mL (ref 0.0–7.9)

## 2021-05-07 LAB — INFECT DISEASE AB IGM REFLEX 1

## 2021-05-07 LAB — CMV ANTIBODY, IGG (EIA): CMV Ab - IgG: <0.6 U/mL (ref 0.00–0.59)

## 2021-05-14 LAB — CYSTIC FIBROSIS GENE TEST

## 2021-06-03 ENCOUNTER — Other Ambulatory Visit: Payer: Self-pay

## 2021-06-03 ENCOUNTER — Ambulatory Visit: Payer: Managed Care, Other (non HMO) | Attending: Maternal & Fetal Medicine

## 2021-06-03 ENCOUNTER — Ambulatory Visit: Payer: Managed Care, Other (non HMO) | Admitting: *Deleted

## 2021-06-03 VITALS — BP 116/72 | HR 96

## 2021-06-03 DIAGNOSIS — O99213 Obesity complicating pregnancy, third trimester: Secondary | ICD-10-CM | POA: Insufficient documentation

## 2021-06-03 DIAGNOSIS — O35DXX0 Maternal care for other (suspected) fetal abnormality and damage, fetal gastrointestinal anomalies, not applicable or unspecified: Secondary | ICD-10-CM | POA: Diagnosis not present

## 2021-06-03 DIAGNOSIS — O99212 Obesity complicating pregnancy, second trimester: Secondary | ICD-10-CM | POA: Diagnosis not present

## 2021-06-03 DIAGNOSIS — E669 Obesity, unspecified: Secondary | ICD-10-CM

## 2021-06-03 DIAGNOSIS — Z3A30 30 weeks gestation of pregnancy: Secondary | ICD-10-CM

## 2021-06-06 NOTE — L&D Delivery Note (Signed)
Delivery Note Patient pushed well for two contractions.  At 5:35 PM a viable female was delivered via Vaginal, Spontaneous (Presentation: Left Occiput Anterior).  APGAR: 7, 9; weight 6 lb 5.8 oz (2885 g).   The head was slow to deliver.  Following delivery, a nuchal cord x 2 was reduced.  The anterior right should did not easily delivery.  The patient was instructed to stop pushing, reclined and McRoberts and suprapubic pressure were employed.  The shoulder then easily delivered.  It is unclear if this was truly a shoulder dystocia (total time < 30 seconds) or just patient positioning.   Placenta status: Spontaneous, Intact.  Cord: 3 vessels with the following complications: None.  Cord pH: n/a  Anesthesia: Epidural Episiotomy: None Lacerations: Vaginal Suture Repair: 3.0 vicryl rapide Est. Blood Loss (mL): 50  Mom to postpartum.  Baby to Couplet care / Skin to Skin.  [facility] GEFFEL Buckley Bradly 07/22/2021, 6:52 PM

## 2021-07-09 ENCOUNTER — Ambulatory Visit (HOSPITAL_COMMUNITY)
Admission: EM | Admit: 2021-07-09 | Discharge: 2021-07-09 | Disposition: A | Discharge status: Home / Self Care | Payer: Managed Care, Other (non HMO) | Attending: Internal Medicine | Admitting: Internal Medicine

## 2021-07-09 ENCOUNTER — Other Ambulatory Visit: Payer: Self-pay

## 2021-07-09 ENCOUNTER — Encounter (HOSPITAL_COMMUNITY): Payer: Self-pay | Admitting: Emergency Medicine

## 2021-07-09 DIAGNOSIS — L299 Pruritus, unspecified: Secondary | ICD-10-CM

## 2021-07-09 DIAGNOSIS — O99713 Diseases of the skin and subcutaneous tissue complicating pregnancy, third trimester: Secondary | ICD-10-CM | POA: Diagnosis not present

## 2021-07-09 DIAGNOSIS — Z3A36 36 weeks gestation of pregnancy: Secondary | ICD-10-CM

## 2021-07-09 MED ORDER — HYDROXYZINE HCL 25 MG PO TABS: 25.0000 mg | ORAL_TABLET | Freq: Four times a day (QID) | ORAL | 0 refills | Status: DC | PRN

## 2021-07-09 NOTE — Discharge Instructions (Addendum)
Please take medications as prescribed Follow-up with your obstetrician in the office on Monday to have repeat labs If you have worsening symptoms please call sooner.

## 2021-07-09 NOTE — ED Triage Notes (Signed)
Reports [redacted] weeks pregnant. And having entire body itching and getting scabs. OB gave steroid cream that didn't help. Changed soaps, lotions, etc and nothing helping.

## 2021-07-11 ENCOUNTER — Other Ambulatory Visit: Payer: Self-pay | Admitting: Nurse Practitioner

## 2021-07-11 DIAGNOSIS — F331 Major depressive disorder, recurrent, moderate: Secondary | ICD-10-CM

## 2021-07-13 NOTE — ED Provider Notes (Signed)
[place]    CSN: [id number] Arrival date & time: 07/09/21  1440      History   Chief Complaint Chief Complaint  Patient presents with   Pruritis    HPI Carmen Wall is a 31 y.o. female who is currently [redacted] weeks pregnant comes to urgent care with several days history of generalized itching.  Patient was seen in the [facility] GYN office and lab results was negative for elevated bilirubin.  Patient was given steroid cream.  Patient denies any improvement in her symptoms.  No rash noted.  Patient denies any changes in soap, body lotions or detergents.  She denies any changes in medications.  No sick contacts with similar symptoms.  Baby is kicking.  No vaginal bleeding or abdominal pain. HPI  Past Medical History:  Diagnosis Date   ADHD    Allergy    Anxiety    Anxiety    Arthritis    Back pain    Bilateral swelling of feet    Chronic fatigue syndrome    Constipation    Depression    IBS (irritable bowel syndrome)    Joint pain    Lactose intolerance    Migraine without aura     Patient Active Problem List   Diagnosis Date Noted   Less than [redacted] weeks gestation of pregnancy 12/11/2020   Backache 09/09/2020   Dysuria 09/09/2020   Excessive or frequent menstruation 09/09/2020   Neck pain 09/09/2020   Radial styloid tenosynovitis 09/09/2020   Other hyperlipidemia 12/12/2019   Insulin resistance 11/25/2019   Vitamin D deficiency 11/25/2019   Piriformis syndrome of right side 11/11/2019   Cough variant asthma 09/26/2019   Excessive daytime sleepiness 03/13/2019   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   Acute right ankle pain 10/24/2018   Class 3 severe obesity with serious comorbidity and body mass index (BMI) of 40.0 to 44.9 in adult ([place]) 08/24/2018   GAD (generalized anxiety disorder) 08/24/2018   Acute pain of right knee 07/23/2018   Chronic right-sided low back pain with right-sided sciatica 05/15/2018   Depression, major, single episode, in  partial remission ([place]) 09/05/2017   Migraine without aura    Arthritis    Allergy    Increased bowel frequency 07/12/2016   Increased urinary frequency 07/12/2016   Rhinitis, allergic 09/09/2014    Past Surgical History:  Procedure Laterality Date   INTRAUTERINE DEVICE (IUD) INSERTION  04/2017   Mirena    KNEE ARTHROSCOPY Right    WISDOM TOOTH EXTRACTION      OB History     Gravida  2   Para  1   Term  1   Preterm      AB      Living  1      SAB      IAB      Ectopic      Multiple      Live Births  1            Home Medications    Prior to Admission medications   Medication Sig Start Date End Date Taking? Authorizing Provider  hydrOXYzine (ATARAX) 25 MG tablet Take 1 tablet (25 mg total) by mouth every 6 (six) hours as needed for itching. 07/09/21  Yes Nyelle Wolfson, Myrene Galas, MD  albuterol (VENTOLIN HFA) 108 (90 Base) MCG/ACT inhaler Inhale into the lungs every 6 (six) hours as needed for wheezing or shortness of breath.    [provider]  Prenatal Vit-Fe Fumarate-FA (PRENATAL VITAMIN PO) Take by mouth.    [provider]    Family History Family History  Problem Relation Age of Onset   Diabetes Father    Arthritis Father    Depression Father    Mental illness Father        PTSD, anxiety   Hypertension Father    Hyperlipidemia Father    Anxiety disorder Father    Bipolar disorder Father    Sleep apnea Father    Alcoholism Father    Obesity Father    Arthritis Mother        osteoartthritis   Fibroids Mother    Ovarian cysts Mother    Obesity Mother    Kidney disease Maternal Grandmother    Arthritis Maternal Grandmother    Prostate cancer Maternal Grandfather    Arthritis Maternal Grandfather    Arthritis Paternal Grandmother    Diabetes Paternal Grandfather    Mental illness Paternal Grandfather    Arthritis Paternal Grandfather     Social History Social History   Tobacco Use   Smoking status: Never   Smokeless  tobacco: Never  Vaping Use   Vaping Use: Never used  Substance Use Topics   Alcohol use: No    Alcohol/week: 0.0 standard drinks   Drug use: No     Allergies   Patient has no known allergies.   Review of Systems Review of Systems  Musculoskeletal: Negative.   Skin: Negative.   Neurological: Negative.     Physical Exam Triage Vital Signs ED Triage Vitals  Enc Vitals Group     BP 07/09/21 1530 (!) 166/90     Pulse Rate 07/09/21 1530 85     Resp 07/09/21 1530 19     Temp 07/09/21 1530 98.7 F (37.1 C)     Temp Source 07/09/21 1530 Oral     SpO2 07/09/21 1530 99 %     Weight --      Height --      Head Circumference --      Peak Flow --      Pain Score 07/09/21 1529 0     Pain Loc --      Pain Edu? --      Excl. in [place]? --    No data found.  Updated Vital Signs BP (!) 166/90 (BP Location: Right Arm)    Pulse 85    Temp 98.7 F (37.1 C) (Oral)    Resp 19    LMP 10/31/2020 (Exact Date)    SpO2 99%   Visual Acuity Right Eye Distance:   Left Eye Distance:   Bilateral Distance:    Right Eye Near:   Left Eye Near:    Bilateral Near:     Physical Exam Vitals and nursing note reviewed.  Constitutional:      General: She is not in acute distress.    Appearance: She is not ill-appearing.  Cardiovascular:     Rate and Rhythm: Normal rate and regular rhythm.  Pulmonary:     Effort: Pulmonary effort is normal.     Breath sounds: Normal breath sounds.  Abdominal:     Comments: Gravid uterus.  Musculoskeletal:        General: Normal range of motion.  Skin:    General: Skin is warm.     Coloration: Skin is not jaundiced.     Findings: No bruising, erythema, lesion or rash.  Neurological:     Mental Status: She is  alert.     UC Treatments / Results  Labs (all labs ordered are listed, but only abnormal results are displayed) Labs Reviewed - No data to display  EKG   Radiology No results found.  Procedures Procedures (including critical care  time)  Medications Ordered in UC Medications - No data to display  Initial Impression / Assessment and Plan / UC Course  I have reviewed the triage vital signs and the nursing notes.  Pertinent labs & imaging results that were available during my care of the patient were reviewed by me and considered in my medical decision making (see chart for details).     1.  Pruritus of pregnancy and third trimester: Hydroxyzine as needed for itching I discussed the case with the obstetrician on-call and they recommended short follow-up in the office to be reevaluated for cholestasis in pregnancy Patient agrees with plan of care. Final Clinical Impressions(s) / UC Diagnoses   Final diagnoses:  Pruritus of pregnancy in third trimester     Discharge Instructions      Please take medications as prescribed Follow-up with your obstetrician in the office on Monday to have repeat labs If you have worsening symptoms please call sooner.   ED Prescriptions     Medication Sig Dispense Auth. Provider   hydrOXYzine (ATARAX) 25 MG tablet Take 1 tablet (25 mg total) by mouth every 6 (six) hours as needed for itching. 30 tablet Celestial [place], Myrene Galas, MD      PDMP not reviewed this encounter.   Chase Picket, MD 07/13/21 [phone]

## 2021-07-15 ENCOUNTER — Encounter (HOSPITAL_COMMUNITY): Payer: Self-pay | Admitting: Obstetrics and Gynecology

## 2021-07-15 ENCOUNTER — Other Ambulatory Visit: Payer: Self-pay

## 2021-07-15 ENCOUNTER — Inpatient Hospital Stay (HOSPITAL_COMMUNITY)
Admission: AD | Admit: 2021-07-15 | Discharge: 2021-07-15 | Disposition: A | Discharge status: Home / Self Care | Payer: Managed Care, Other (non HMO) | Attending: Obstetrics and Gynecology | Admitting: Obstetrics and Gynecology

## 2021-07-15 ENCOUNTER — Inpatient Hospital Stay (HOSPITAL_BASED_OUTPATIENT_CLINIC_OR_DEPARTMENT_OTHER): Payer: Managed Care, Other (non HMO)

## 2021-07-15 DIAGNOSIS — E669 Obesity, unspecified: Secondary | ICD-10-CM | POA: Diagnosis not present

## 2021-07-15 DIAGNOSIS — O09213 Supervision of pregnancy with history of pre-term labor, third trimester: Secondary | ICD-10-CM

## 2021-07-15 DIAGNOSIS — Z3493 Encounter for supervision of normal pregnancy, unspecified, third trimester: Secondary | ICD-10-CM

## 2021-07-15 DIAGNOSIS — Z3A36 36 weeks gestation of pregnancy: Secondary | ICD-10-CM

## 2021-07-15 DIAGNOSIS — O368130 Decreased fetal movements, third trimester, not applicable or unspecified: Secondary | ICD-10-CM | POA: Diagnosis not present

## 2021-07-15 DIAGNOSIS — O4703 False labor before 37 completed weeks of gestation, third trimester: Secondary | ICD-10-CM | POA: Diagnosis not present

## 2021-07-15 NOTE — MAU Provider Note (Addendum)
[place]     CSN: [id number]  Arrival date and time: 07/15/21 1501   Event Date/Time   First Provider Initiated Contact with Patient 07/15/21 1520      Chief Complaint  Patient presents with   Decreased Fetal Movement   Contractions   HPI Carmen Wall is a 31 y.o. G2P1001 at [redacted]w[redacted]d who presents with contractions and decreased fetal movement. She states she started contracting at 0300. She reports they are every 4-5 minutes. She rates them a 4/10 and has not tried anything for the pain. She reports the baby hasn't been moving as much since she started hurting. She denies any bleeding or leaking. Patient states she hasn't been checked in the office. It was supposed to be today but her appointment was moved to tomorrow.   OB History     Gravida  2   Para  1   Term  1   Preterm      AB      Living  1      SAB      IAB      Ectopic      Multiple      Live Births  1           Past Medical History:  Diagnosis Date   ADHD    Allergy    Anxiety    Anxiety    Arthritis    Back pain    Bilateral swelling of feet    Chronic fatigue syndrome    Constipation    Depression    IBS (irritable bowel syndrome)    Joint pain    Lactose intolerance    Migraine without aura     Past Surgical History:  Procedure Laterality Date   INTRAUTERINE DEVICE (IUD) INSERTION  04/2017   Mirena    KNEE ARTHROSCOPY Right    WISDOM TOOTH EXTRACTION      Family History  Problem Relation Age of Onset   Diabetes Father    Arthritis Father    Depression Father    Mental illness Father        PTSD, anxiety   Hypertension Father    Hyperlipidemia Father    Anxiety disorder Father    Bipolar disorder Father    Sleep apnea Father    Alcoholism Father    Obesity Father    Arthritis Mother        osteoartthritis   Fibroids Mother    Ovarian cysts Mother    Obesity Mother    Kidney disease Maternal Grandmother    Arthritis Maternal Grandmother     Prostate cancer Maternal Grandfather    Arthritis Maternal Grandfather    Arthritis Paternal Grandmother    Diabetes Paternal Grandfather    Mental illness Paternal Grandfather    Arthritis Paternal Grandfather     Social History   Tobacco Use   Smoking status: Never   Smokeless tobacco: Never  Vaping Use   Vaping Use: Never used  Substance Use Topics   Alcohol use: No    Alcohol/week: 0.0 standard drinks   Drug use: No    Allergies: No Known Allergies  Medications Prior to Admission  Medication Sig Dispense Refill Last Dose   hydrOXYzine (ATARAX) 25 MG tablet Take 1 tablet (25 mg total) by mouth every 6 (six) hours as needed for itching. 30 tablet 0 Past Week   albuterol (VENTOLIN HFA) 108 (90 Base) MCG/ACT inhaler Inhale into the lungs every 6 (six) hours as  needed for wheezing or shortness of breath.      Prenatal Vit-Fe Fumarate-FA (PRENATAL VITAMIN PO) Take by mouth.       Review of Systems  Constitutional: Negative.  Negative for fatigue and fever.  HENT: Negative.    Respiratory: Negative.  Negative for shortness of breath.   Cardiovascular: Negative.  Negative for chest pain.  Gastrointestinal:  Positive for abdominal pain. Negative for constipation, diarrhea, nausea and vomiting.  Genitourinary: Negative.  Negative for dysuria, vaginal bleeding and vaginal discharge.  Neurological: Negative.  Negative for dizziness and headaches.  Physical Exam   Blood pressure 126/60, pulse 81, temperature 98.6 F (37 C), resp. rate 20, last menstrual period 10/31/2020, SpO2 98 %.  Physical Exam Vitals and nursing note reviewed.  Constitutional:      General: She is not in acute distress.    Appearance: She is well-developed.  HENT:     Head: Normocephalic.  Eyes:     Pupils: Pupils are equal, round, and reactive to light.  Cardiovascular:     Rate and Rhythm: Normal rate and regular rhythm.     Heart sounds: Normal heart sounds.  Pulmonary:     Effort: Pulmonary  effort is normal. No respiratory distress.     Breath sounds: Normal breath sounds.  Abdominal:     General: Bowel sounds are normal. There is no distension.     Palpations: Abdomen is soft.     Tenderness: There is no abdominal tenderness.  Skin:    General: Skin is warm and dry.  Neurological:     Mental Status: She is alert and oriented to person, place, and time.  Psychiatric:        Mood and Affect: Mood normal.        Behavior: Behavior normal.        Thought Content: Thought content normal.        Judgment: Judgment normal.   Fetal Tracing:  Baseline: 140 Variability: moderate Accels: 15x15 Decels: 1 variable before BPP  Toco: 1 UC    Cervix: 0.5/thick/posterior  MAU Course  Procedures  [place] MFM Fetal BPP Wo Non Stress  Result Date: 07/15/2021 ----------------------------------------------------------------------  OBSTETRICS REPORT                       (Signed Final 07/15/2021 05:08 pm) ---------------------------------------------------------------------- Patient Info  ID #:       [id number]                          D.O.B.:  15-Nov-1990 (30 yrs)  Name:       Carmen Wall                    Visit Date: 07/15/2021 04:41 pm              [facility] ---------------------------------------------------------------------- Performed By  Attending:        Tama High MD        Ref. Address:     [facility]                                                             [facility]  [address]                                                             [place], [place]  Performed By:     Nathen May       [facility]  Referred By:       Jerelyn Charles                    MD ---------------------------------------------------------------------- Orders  #  Description                           Code        Ordered By  1  [place] MFM FETAL BPP WO NON               [id number]    Wynne Jury     STRESS ----------------------------------------------------------------------  #  Order #                     Accession #                Episode #  1  [id number]                   [id number]                 [id number] ---------------------------------------------------------------------- Indications  Decreased fetal movement                       O36.8190  Abnormal fetal ultrasound (echogenic bowel)-   O28.9  Resolved  Obesity complicating pregnancy, third          O99.213  trimester BMI 46  Other mental disorder complicating  O99.340  pregnancy, third trimester  [redacted] weeks gestation of pregnancy                Z3A.36  Declined Testing ---------------------------------------------------------------------- Fetal Evaluation  Num Of Fetuses:         1  Fetal Heart Rate(bpm):  148  Cardiac Activity:       Observed  Presentation:           Cephalic  Placenta:               Anterior  P. Cord Insertion:      Previously Visualized  Amniotic Fluid  AFI FV:      Within normal limits  AFI Sum(cm)     %Tile       Largest Pocket(cm)  14.09           52          5.76  RUQ(cm)       RLQ(cm)       LUQ(cm)        LLQ(cm)  5.76          1.78          2.84           3.71 ---------------------------------------------------------------------- Biophysical Evaluation  Amniotic F.V:   Pocket => 2 cm             F. Tone:        Observed  F. Movement:    Observed                   Score:          8/8  F. Breathing:   Observed ---------------------------------------------------------------------- OB History  Gravidity:    2  Living:       1 ---------------------------------------------------------------------- Gestational Age  LMP:           36w 5d        Date:  10/31/20                  EDD:   08/07/21  Best:          36w 5d     Det. By:  LMP  (10/31/20)          EDD:   08/07/21 ---------------------------------------------------------------------- Impression  Patient was evaluated for c/o decreased fetal movements .  Amniotic fluid is normal and good fetal activity is seen  .Antenatal testing is reassuring. BPP 8/8. Cephalic  presentation. ----------------------------------------------------------------------                  Tama High, MD Electronically Signed Final Report   07/15/2021 05:08 pm ----------------------------------------------------------------------    MDM NST reactive- patient states she is still not feeling normal movement, reports feeling flutters. 1 variable with UC noted [place] MFM BPP No signs of preterm labor at this time. PO hydration   Assessment and Plan   1. False labor before 37 completed weeks of gestation in third trimester   2. Movement of fetus present during pregnancy in third trimester   3. [redacted] weeks gestation of pregnancy    -Discharge home in stable condition -Labor precautions discussed -Patient advised to follow-up with OB as scheduled for prenatal care -Patient may return to MAU as needed or if her condition were to change or worsen   Wende Mott CNM 07/15/2021, 3:20 PM

## 2021-07-15 NOTE — Discharge Instructions (Signed)

## 2021-07-15 NOTE — MAU Note (Signed)
Pt reports she has not felt the baby move since last night.   Pt reports ctx's that started at 0300.   Denies vaginal bleeding or LOF.

## 2021-07-16 LAB — OB RESULTS CONSOLE GBS: GBS: NEGATIVE

## 2021-07-22 ENCOUNTER — Inpatient Hospital Stay (HOSPITAL_COMMUNITY)
Admission: AD | Admit: 2021-07-22 | Discharge: 2021-07-24 | DRG: 805 | Disposition: A | Discharge status: Home / Self Care | Payer: Managed Care, Other (non HMO) | Attending: Obstetrics | Admitting: Obstetrics

## 2021-07-22 ENCOUNTER — Inpatient Hospital Stay (HOSPITAL_COMMUNITY): Payer: Managed Care, Other (non HMO) | Admitting: Anesthesiology

## 2021-07-22 ENCOUNTER — Inpatient Hospital Stay (HOSPITAL_COMMUNITY): Payer: Managed Care, Other (non HMO)

## 2021-07-22 ENCOUNTER — Other Ambulatory Visit: Payer: Self-pay

## 2021-07-22 ENCOUNTER — Encounter (HOSPITAL_COMMUNITY): Payer: Self-pay | Admitting: Obstetrics

## 2021-07-22 DIAGNOSIS — O134 Gestational [pregnancy-induced] hypertension without significant proteinuria, complicating childbirth: Principal | ICD-10-CM | POA: Diagnosis present

## 2021-07-22 DIAGNOSIS — O2662 Liver and biliary tract disorders in childbirth: Secondary | ICD-10-CM | POA: Diagnosis present

## 2021-07-22 DIAGNOSIS — Z8759 Personal history of other complications of pregnancy, childbirth and the puerperium: Secondary | ICD-10-CM | POA: Diagnosis present

## 2021-07-22 DIAGNOSIS — Z3A37 37 weeks gestation of pregnancy: Secondary | ICD-10-CM | POA: Diagnosis not present

## 2021-07-22 DIAGNOSIS — F32A Depression, unspecified: Secondary | ICD-10-CM | POA: Diagnosis present

## 2021-07-22 DIAGNOSIS — O99214 Obesity complicating childbirth: Secondary | ICD-10-CM | POA: Diagnosis present

## 2021-07-22 DIAGNOSIS — Z20822 Contact with and (suspected) exposure to covid-19: Secondary | ICD-10-CM | POA: Diagnosis present

## 2021-07-22 DIAGNOSIS — K831 Obstruction of bile duct: Secondary | ICD-10-CM | POA: Diagnosis present

## 2021-07-22 DIAGNOSIS — O99344 Other mental disorders complicating childbirth: Secondary | ICD-10-CM | POA: Diagnosis present

## 2021-07-22 DIAGNOSIS — O133 Gestational [pregnancy-induced] hypertension without significant proteinuria, third trimester: Secondary | ICD-10-CM | POA: Diagnosis present

## 2021-07-22 LAB — CBC
HCT: 36.4 % (ref 36.0–46.0)
HCT: 35.3 % — ABNORMAL LOW (ref 36.0–46.0)
Hemoglobin: 11.9 g/dL — ABNORMAL LOW (ref 12.0–15.0)
Hemoglobin: 12 g/dL (ref 12.0–15.0)
MCH: 30.6 pg (ref 26.0–34.0)
MCH: 31.4 pg (ref 26.0–34.0)
MCHC: 32.7 g/dL (ref 30.0–36.0)
MCHC: 34 g/dL (ref 30.0–36.0)
MCV: 93.6 fL (ref 80.0–100.0)
MCV: 92.4 fL (ref 80.0–100.0)
Platelets: 182 10*3/uL (ref 150–400)
Platelets: 164 10*3/uL (ref 150–400)
RBC: 3.89 MIL/uL (ref 3.87–5.11)
RBC: 3.82 MIL/uL — ABNORMAL LOW (ref 3.87–5.11)
RDW: 13.3 % (ref 11.5–15.5)
RDW: 13.4 % (ref 11.5–15.5)
WBC: 6.2 10*3/uL (ref 4.0–10.5)
WBC: 8.7 10*3/uL (ref 4.0–10.5)
nRBC: 0 % (ref 0.0–0.2)
nRBC: 0 % (ref 0.0–0.2)

## 2021-07-22 LAB — COMPREHENSIVE METABOLIC PANEL
ALT: 29 U/L (ref 0–44)
AST: 27 U/L (ref 15–41)
Albumin: 2.3 g/dL — ABNORMAL LOW (ref 3.5–5.0)
Alkaline Phosphatase: 178 U/L — ABNORMAL HIGH (ref 38–126)
Anion gap: 8 (ref 5–15)
BUN: 7 mg/dL (ref 6–20)
CO2: 25 mmol/L (ref 22–32)
Calcium: 8.8 mg/dL — ABNORMAL LOW (ref 8.9–10.3)
Chloride: 106 mmol/L (ref 98–111)
Creatinine, Ser: 0.64 mg/dL (ref 0.44–1.00)
GFR, Estimated: >60 mL/min (ref >60)
Glucose, Bld: 78 mg/dL (ref 70–99)
Potassium: 4.1 mmol/L (ref 3.5–5.1)
Sodium: 139 mmol/L (ref 135–145)
Total Bilirubin: 0.7 mg/dL (ref 0.3–1.2)
Total Protein: 5.9 g/dL — ABNORMAL LOW (ref 6.5–8.1)

## 2021-07-22 LAB — RESP PANEL BY RT-PCR (FLU A&B, COVID) ARPGX2
Influenza A by PCR: NEGATIVE
Influenza B by PCR: NEGATIVE
SARS Coronavirus 2 by RT PCR: NEGATIVE

## 2021-07-22 MED ORDER — ONDANSETRON HCL 4 MG/2ML IJ SOLN: 4.0000 mg | INTRAMUSCULAR | Status: DC | PRN

## 2021-07-22 MED ORDER — DIPHENHYDRAMINE HCL 50 MG/ML IJ SOLN: 12.5000 mg | INTRAMUSCULAR | Status: DC | PRN

## 2021-07-22 MED ORDER — NIFEDIPINE ER OSMOTIC RELEASE 30 MG PO TB24
30.0000 mg | ORAL_TABLET | Freq: Every day | ORAL | Status: DC
  Administered 2021-07-22 – 2021-07-23 (×2): 30 mg via ORAL
  Filled 2021-07-22 (×2): qty 1

## 2021-07-22 MED ORDER — SENNOSIDES-DOCUSATE SODIUM 8.6-50 MG PO TABS
2.0000 | ORAL_TABLET | ORAL | Status: DC
  Administered 2021-07-23 – 2021-07-24 (×2): 2 via ORAL
  Filled 2021-07-22 (×2): qty 2

## 2021-07-22 MED ORDER — IBUPROFEN 600 MG PO TABS
600.0000 mg | ORAL_TABLET | Freq: Four times a day (QID) | ORAL | Status: DC
  Administered 2021-07-22 – 2021-07-24 (×6): 600 mg via ORAL
  Filled 2021-07-22 (×6): qty 1

## 2021-07-22 MED ORDER — LACTATED RINGERS IV SOLN: 500.0000 mL | Freq: Once | INTRAVENOUS | Status: DC

## 2021-07-22 MED ORDER — EPHEDRINE 5 MG/ML INJ: 10.0000 mg | INTRAVENOUS | Status: DC | PRN

## 2021-07-22 MED ORDER — FENTANYL-BUPIVACAINE-NACL 0.5-0.125-0.9 MG/250ML-% EP SOLN
12.0000 mL/h | EPIDURAL | Status: DC | PRN
  Filled 2021-07-22: qty 250

## 2021-07-22 MED ORDER — WITCH HAZEL-GLYCERIN EX PADS: 1.0000 "application " | MEDICATED_PAD | CUTANEOUS | Status: DC | PRN

## 2021-07-22 MED ORDER — SOD CITRATE-CITRIC ACID 500-334 MG/5ML PO SOLN: 30.0000 mL | ORAL | Status: DC | PRN

## 2021-07-22 MED ORDER — PHENYLEPHRINE 40 MCG/ML (10ML) SYRINGE FOR IV PUSH (FOR BLOOD PRESSURE SUPPORT): 80.0000 ug | PREFILLED_SYRINGE | INTRAVENOUS | Status: DC | PRN

## 2021-07-22 MED ORDER — OXYTOCIN-SODIUM CHLORIDE 30-0.9 UT/500ML-% IV SOLN
2.5000 [IU]/h | INTRAVENOUS | Status: DC
  Filled 2021-07-22: qty 500

## 2021-07-22 MED ORDER — OXYTOCIN BOLUS FROM INFUSION
333.0000 mL | Freq: Once | INTRAVENOUS | Status: AC
  Administered 2021-07-22: 333 mL via INTRAVENOUS

## 2021-07-22 MED ORDER — TERBUTALINE SULFATE 1 MG/ML IJ SOLN: 0.2500 mg | Freq: Once | INTRAMUSCULAR | Status: DC | PRN

## 2021-07-22 MED ORDER — ONDANSETRON HCL 4 MG/2ML IJ SOLN: 4.0000 mg | Freq: Four times a day (QID) | INTRAMUSCULAR | Status: DC | PRN

## 2021-07-22 MED ORDER — SIMETHICONE 80 MG PO CHEW
80.0000 mg | CHEWABLE_TABLET | ORAL | Status: DC | PRN
  Administered 2021-07-23: 80 mg via ORAL
  Filled 2021-07-22: qty 1

## 2021-07-22 MED ORDER — OXYCODONE HCL 5 MG PO TABS: 10.0000 mg | ORAL_TABLET | ORAL | Status: DC | PRN

## 2021-07-22 MED ORDER — LIDOCAINE HCL (PF) 1 % IJ SOLN: 30.0000 mL | INTRAMUSCULAR | Status: DC | PRN

## 2021-07-22 MED ORDER — LACTATED RINGERS IV SOLN: 500.0000 mL | INTRAVENOUS | Status: DC | PRN

## 2021-07-22 MED ORDER — FENTANYL CITRATE (PF) 100 MCG/2ML IJ SOLN: 50.0000 ug | INTRAMUSCULAR | Status: DC | PRN

## 2021-07-22 MED ORDER — OXYCODONE-ACETAMINOPHEN 5-325 MG PO TABS: 2.0000 | ORAL_TABLET | ORAL | Status: DC | PRN

## 2021-07-22 MED ORDER — TETANUS-DIPHTH-ACELL PERTUSSIS 5-2.5-18.5 LF-MCG/0.5 IM SUSY
0.5000 mL | PREFILLED_SYRINGE | Freq: Once | INTRAMUSCULAR | Status: DC
Start: 2021-07-23 — End: 2021-07-24

## 2021-07-22 MED ORDER — DIBUCAINE (PERIANAL) 1 % EX OINT: 1.0000 "application " | TOPICAL_OINTMENT | CUTANEOUS | Status: DC | PRN

## 2021-07-22 MED ORDER — PRENATAL MULTIVITAMIN CH
1.0000 | ORAL_TABLET | Freq: Every day | ORAL | Status: DC
  Administered 2021-07-23: 1 via ORAL
  Filled 2021-07-22: qty 1

## 2021-07-22 MED ORDER — BENZOCAINE-MENTHOL 20-0.5 % EX AERO
1.0000 "application " | INHALATION_SPRAY | CUTANEOUS | Status: DC | PRN
  Administered 2021-07-22: 1 via TOPICAL
  Filled 2021-07-22: qty 56

## 2021-07-22 MED ORDER — ACETAMINOPHEN 325 MG PO TABS
650.0000 mg | ORAL_TABLET | ORAL | Status: DC | PRN
  Administered 2021-07-23 – 2021-07-24 (×3): 650 mg via ORAL
  Filled 2021-07-22 (×3): qty 2

## 2021-07-22 MED ORDER — OXYCODONE-ACETAMINOPHEN 5-325 MG PO TABS: 1.0000 | ORAL_TABLET | ORAL | Status: DC | PRN

## 2021-07-22 MED ORDER — MISOPROSTOL 25 MCG QUARTER TABLET
25.0000 ug | ORAL_TABLET | ORAL | Status: DC | PRN
  Administered 2021-07-22: 25 ug via VAGINAL
  Filled 2021-07-22: qty 1

## 2021-07-22 MED ORDER — LACTATED RINGERS IV SOLN: INTRAVENOUS | Status: DC

## 2021-07-22 MED ORDER — DIPHENHYDRAMINE HCL 25 MG PO CAPS
25.0000 mg | ORAL_CAPSULE | Freq: Four times a day (QID) | ORAL | Status: DC | PRN
  Administered 2021-07-22: 25 mg via ORAL
  Filled 2021-07-22: qty 1

## 2021-07-22 MED ORDER — ONDANSETRON HCL 4 MG PO TABS: 4.0000 mg | ORAL_TABLET | ORAL | Status: DC | PRN

## 2021-07-22 MED ORDER — OXYCODONE HCL 5 MG PO TABS
5.0000 mg | ORAL_TABLET | ORAL | Status: DC | PRN
  Administered 2021-07-24: 5 mg via ORAL
  Filled 2021-07-22: qty 1

## 2021-07-22 MED ORDER — COCONUT OIL OIL: 1.0000 "application " | TOPICAL_OIL | Status: DC | PRN

## 2021-07-22 MED ORDER — ACETAMINOPHEN 325 MG PO TABS: 650.0000 mg | ORAL_TABLET | ORAL | Status: DC | PRN

## 2021-07-22 NOTE — Progress Notes (Signed)
Patient seen and examined.  SROM with clear fluid approximately 30 minutes ago.  Now contracting painfully every 2-5 minutes   BP (!) 153/85    Pulse 65    Temp 98.2 F (36.8 C) (Oral)    Resp 16    Ht 5\' 7"  (1.702 m)    Wt (!) 152.6 kg    LMP 10/31/2020 (Exact Date)    BMI 52.70 kg/m  TOco: q2-5 minutes EFM: 140s, moderate variability, category 1 SVE: 3/50/-2, clear fluid  A/P: G2P1 @ [redacted]w[redacted]d with IOL for GHTN IOL: Now contracting regularly and painfully after SROM and one dose of cytotec.  Will monitor closely and start pitocin if needed GHTN: no s/sx of severe preeclampsia.  Normal labs, no severe range blood pressures GBS negative Anticipate SVD

## 2021-07-22 NOTE — H&P (Signed)
31 y.o. G2P1001 @ [redacted]w[redacted]d presents for  induction of labor for new onset gestational hypertension and cholestasis of pregnancy.  Was seen in the office on 2/14 with 31 y.o. elevated BP. She denies history of hypertension with G1.  Repeat blood pressure on 33/14 was normal.  At that time she also reported significant full body itching.  Bile acids were normal in January, but were repeated.  CBC and CMP were normal on 33/14. She returned for a repeat blood pressure check yesterday which was again elevated at 146/82, confirming a diagnosis of gestational hypertension.  NST was reactive and she remained asymptomatic so she was scheduled for induction of labor today.  This morning, her bile acids returned elevated at 33, consistent with a diagnosis of cholestasis of pregnancy.  Otherwise has good fetal movement and no bleeding.  Pregnancy complicated by: Pre-pregnancy BMI 47 Depression:  Stopped lexapro early pregnancy Echogenic bowel:  Seen on MFM ultrasound at 23 weeks.  CMV/toxo were negative. CF carrier screen negative.  NIPT low risk.  Echogenic bowel was resolved at 30 week follow up.  Most recent growth [place] at 34 weeks was 5lb 9oz (52%)  Past Medical History:  Diagnosis Date   ADHD    Allergy    Anxiety    Anxiety    Arthritis    Back pain    Bilateral swelling of feet    Chronic fatigue syndrome    Constipation    Depression    IBS (irritable bowel syndrome)    Joint pain    Lactose intolerance    Migraine without aura     Past Surgical History:  Procedure Laterality Date   INTRAUTERINE DEVICE (IUD) INSERTION  04/2017   Mirena    KNEE ARTHROSCOPY Right    WISDOM TOOTH EXTRACTION      OB History  Gravida Para Term Preterm AB Living  2 1 1     1   SAB IAB Ectopic Multiple Live Births          1    # Outcome Date GA Lbr Len/2nd Weight Sex Delivery Anes PTL Lv  2 Current           1 Term      Vag-Spont   LIV    Social History   Socioeconomic History   Marital status: Married     Spouse name: Not on file   Number of children: Not on file   Years of education: Not on file   Highest education level: Not on file  Occupational History   Not on file  Tobacco Use   Smoking status: Never   Smokeless tobacco: Never  Vaping Use   Vaping Use: Never used  Substance and Sexual Activity   Alcohol use: No    Alcohol/week: 0.0 standard drinks   Drug use: No   Sexual activity: Yes    Partners: Male  Other Topics Concern   Not on file  Social History Narrative   Not on file   Social Determinants of Health   Financial Resource Strain: Not on file  Food Insecurity: Not on file  Transportation Needs: Not on file  Physical Activity: Not on file  Stress: Not on file  Social Connections: Not on file  Intimate Partner Violence: Not on file   Patient has no allergy information on record.    Prenatal Transfer Tool  Maternal Diabetes: No Genetic Screening: Normal Maternal Ultrasounds/Referrals: Echogenic bowel--see above Fetal Ultrasounds or other Referrals:  Referred to Materal Fetal  Medicine  Maternal Substance Abuse:  No Significant Maternal Medications:  None Significant Maternal Lab Results: Group B Strep negative  ABO, Rh: --/--/PENDING (02/16 [id number]) Antibody: PENDING (02/16 0950) Rubella: Immune (08/19 0000) RPR: Nonreactive (08/19 0000)  HBsAg: Negative (08/19 0000)  HIV: Non-reactive (08/19 0000)  GBS: Negative/-- (02/10 0000)   Vitals:   07/22/21 0944  BP: 138/78  Pulse: 79  Resp: 16  Temp: 98.3 F (36.8 C)     General:  NAD Abdomen:  soft, gravid, EFW 7# Ex:  1+ edema SVE:  1/30/-3/soft/posterior FHTs:  130s, moderate variability, category 1 Toco:  irregular contractions   A/P   31 y.o. G2P1001 [redacted]w[redacted]d presents for  induction of labor for new onset gestational hypertension and cholestasis of pregnancy IOL: Cervix is unfavorable, will start with cytotec for cervical ripening GHTN: Mild range this morning.  Will check labs, monitor  closely Cholestasis of pregnancy: Will repeat LFTs today  Cephalic position confirmed by limited bedside ultrasound / GBS negative  [place]

## 2021-07-22 NOTE — Progress Notes (Signed)
Dr. Chestine Spore called at 2307 for increased BP after procardia given. New BP parameters given, call only for BP's above 160/110.

## 2021-07-22 NOTE — Progress Notes (Signed)
Vitals:   07/22/21 1847 07/22/21 1902 07/22/21 1936 07/22/21 1944  BP: (!) 144/81 (!) 142/81 (!) 145/74 (!) 152/84  Pulse: 73 76 86 77  Resp: 16 16  18   Temp:   98.1 F (36.7 C) 98.6 F (37 C)  TempSrc:   Oral Oral  SpO2:    99%  Weight:      Height:        Persistently elevated blood pressures since delivery.  Will start procardia XL 30 mg daily

## 2021-07-22 NOTE — Lactation Note (Signed)
This note was copied from a baby's chart. Lactation Consultation Note Mom chooses to formula feed. Declines LC services.  Patient Name: Carmen Wall [id number] Date: 07/22/2021   Age:33 hours  Maternal Data    Feeding Nipple Type: Slow - flow  LATCH Score                    Lactation Tools Discussed/Used    Interventions    Discharge    Consult Status Consult Status: Complete    Charyl Dancer 07/22/2021, 7:29 PM

## 2021-07-22 NOTE — Anesthesia Preprocedure Evaluation (Signed)
Anesthesia Evaluation  Patient identified by MRN, date of birth, ID band Patient awake    Reviewed: Allergy & Precautions, Patient's Chart, lab work & pertinent test results  Airway Mallampati: II  TM Distance: >3 FB Neck ROM: Full    Dental no notable dental hx.    Pulmonary asthma ,    Pulmonary exam normal breath sounds clear to auscultation       Cardiovascular hypertension (gest HTN), Normal cardiovascular exam Rhythm:Regular Rate:Normal     Neuro/Psych  Headaches, PSYCHIATRIC DISORDERS Anxiety Depression    GI/Hepatic negative GI ROS, Neg liver ROS,   Endo/Other  Morbid obesityBMI 53  Renal/GU negative Renal ROS  negative genitourinary   Musculoskeletal  (+) Arthritis , Osteoarthritis,    Abdominal   Peds negative pediatric ROS (+)  Hematology negative hematology ROS (+) hct 36.4, plt 182   Anesthesia Other Findings   Reproductive/Obstetrics (+) Pregnancy                             Anesthesia Physical Anesthesia Plan  ASA: 3  Anesthesia Plan: Epidural   Post-op Pain Management:    Induction:   PONV Risk Score and Plan: 2  Airway Management Planned: Natural Airway  Additional Equipment: None  Intra-op Plan:   Post-operative Plan:   Informed Consent: I have reviewed the patients History and Physical, chart, labs and discussed the procedure including the risks, benefits and alternatives for the proposed anesthesia with the patient or authorized representative who has indicated his/her understanding and acceptance.       Plan Discussed with:   Anesthesia Plan Comments:         Anesthesia Quick Evaluation

## 2021-07-23 LAB — CBC
HCT: 34 % — ABNORMAL LOW (ref 36.0–46.0)
Hemoglobin: 11.5 g/dL — ABNORMAL LOW (ref 12.0–15.0)
MCH: 31.3 pg (ref 26.0–34.0)
MCHC: 33.8 g/dL (ref 30.0–36.0)
MCV: 92.6 fL (ref 80.0–100.0)
Platelets: 164 10*3/uL (ref 150–400)
RBC: 3.67 MIL/uL — ABNORMAL LOW (ref 3.87–5.11)
RDW: 13.4 % (ref 11.5–15.5)
WBC: 7.3 10*3/uL (ref 4.0–10.5)
nRBC: 0 % (ref 0.0–0.2)

## 2021-07-23 LAB — RPR: RPR Ser Ql: NONREACTIVE

## 2021-07-23 LAB — TYPE AND SCREEN
ABO/RH(D): A POS
Antibody Screen: NEGATIVE

## 2021-07-23 MED ORDER — LIDOCAINE HCL (PF) 1 % IJ SOLN
INTRAMUSCULAR | Status: DC | PRN
  Administered 2021-07-22: 10 mL via EPIDURAL
  Administered 2021-07-22: 2 mL via EPIDURAL

## 2021-07-23 MED ORDER — FENTANYL-BUPIVACAINE-NACL 0.5-0.125-0.9 MG/250ML-% EP SOLN
EPIDURAL | Status: DC | PRN
  Administered 2021-07-22: 12 mL/h via EPIDURAL

## 2021-07-23 NOTE — Social Work (Signed)
CSW received consult for hx of Anxiety and Depression.  CSW met with MOB to offer support and complete assessment.    CSW met with MOB at bedside and introduced CSW role. CSW observed MOB holding the infant and FOB present at bedside. CSW offered MOB privacy. MOB presented calm and gave CSW permission to complete the assessment with FOB present. CSW inquired how MOB has felt since giving birth. MOB reported feeling good and shared the delivery went well. MOB reported that she felt excited about the baby throughout the pregnancy. CSW inquired about MOB history of anxiety and depression. MOB acknowledged her diagnosis of depression and depression and explained that she was diagnosed about six years ago. MOB reported that her anxiety and depression is triggered when she feels overwhelmed and in large crowds. MOB reported prior to the pregnancy she took Lexparo and Trazodone to treat symptoms which she felt helps. MOB reported that she will restart Lexapro postpartum if she feels she needs it. CSW inquired about MOB coping skills. MOB reported that she likes to stay busy and do things that make her happy. MOB acknowledged that her husband, parents, and family as supports. CSW provided education regarding the baby blues period vs. perinatal mood disorders, discussed treatment and gave resources for mental health follow up if concerns arise.  CSW recommended MOB complete a self-evaluation during the postpartum time period using the New Mom Checklist from Postpartum Progress and encouraged MOB to contact a medical professional if symptoms are noted at any time. MOB denied thoughts of harm to self and others.   MOB reported she has items for the infant including a cradle where the infant will sleep. CSW provided review of Sudden Infant Death Syndrome (SIDS) precautions. MOB reported understanding. MOB and FOB to decide on a pediatrician. CSW assessed MOB for additional needs. MOB reported no further needs.   CSW  identifies no further need for intervention and no barriers to discharge at this time.   Kathrin Greathouse, MSW, LCSW [facility] [place] Work[facility]  [phone] 07/23/2021  11:49 AM

## 2021-07-23 NOTE — Anesthesia Postprocedure Evaluation (Signed)
Anesthesia Post Note  Patient: Carmen Wall [facility]  Procedure(s) Performed: AN AD HOC LABOR EPIDURAL     Patient location during evaluation: Mother Baby Anesthesia Type: Epidural Level of consciousness: awake and alert Pain management: pain level controlled Vital Signs Assessment: post-procedure vital signs reviewed and stable Respiratory status: spontaneous breathing, nonlabored ventilation and respiratory function stable Cardiovascular status: stable Postop Assessment: no headache, no backache, epidural receding, no apparent nausea or vomiting, patient able to bend at knees, adequate PO intake and able to ambulate Anesthetic complications: no   No notable events documented.  Last Vitals:  Vitals:   07/23/21 0212 07/23/21 0532  BP: (!) 156/95 (!) 153/92  Pulse: 70 72  Resp: 17 18  Temp: 36.5 C 36.5 C  SpO2: 96% 99%    Last Pain:  Vitals:   07/23/21 0720  TempSrc:   PainSc: Asleep   Pain Goal:                   [name]

## 2021-07-23 NOTE — Anesthesia Procedure Notes (Signed)
Epidural Patient location during procedure: OB Start time: 07/22/2021 2:50 PM End time: 07/22/2021 3:00 PM  Staffing Anesthesiologist: Lannie Fields, DO Performed: anesthesiologist   Preanesthetic Checklist Completed: patient identified, IV checked, risks and benefits discussed, monitors and equipment checked, pre-op evaluation and timeout performed  Epidural Patient position: sitting Prep: DuraPrep and site prepped and draped Patient monitoring: continuous pulse ox, blood pressure, heart rate and cardiac monitor Approach: midline Location: L3-L4 Injection technique: LOR air  Needle:  Needle type: Tuohy  Needle gauge: 17 G Needle length: 9 cm Needle insertion depth: 8 cm Catheter type: closed end flexible Catheter size: 19 Gauge Catheter at skin depth: 14 cm Test dose: negative  Assessment Sensory level: T8 Events: blood not aspirated, injection not painful, no injection resistance, no paresthesia and negative IV test  Additional Notes Patient identified. Risks/Benefits/Options discussed with patient including but not limited to bleeding, infection, nerve damage, paralysis, failed block, incomplete pain control, headache, blood pressure changes, nausea, vomiting, reactions to medication both or allergic, itching and postpartum back pain. Confirmed with bedside nurse the patient's most recent platelet count. Confirmed with patient that they are not currently taking any anticoagulation, have any bleeding history or any family history of bleeding disorders. Patient expressed understanding and wished to proceed. All questions were answered. Sterile technique was used throughout the entire procedure. Please see nursing notes for vital signs. Test dose was given through epidural catheter and negative prior to continuing to dose epidural or start infusion. Warning signs of high block given to the patient including shortness of breath, tingling/numbness in hands, complete motor  block, or any concerning symptoms with instructions to call for help. Patient was given instructions on fall risk and not to get out of bed. All questions and concerns addressed with instructions to call with any issues or inadequate analgesia.  Reason for block:procedure for pain

## 2021-07-23 NOTE — Progress Notes (Signed)
Post Partum Day 1 Subjective: no complaints, up ad lib, voiding, tolerating PO, + flatus, and lochia mild. Pt reports no HA, CP or SOB. She is bonding well with baby - bottlefeeding.   Objective: Blood pressure (!) 153/92, pulse 72, temperature 97.7 F (36.5 C), temperature source Oral, resp. rate 18, height 5\' 7"  (1.702 m), weight (!) 152.6 kg, last menstrual period 10/31/2020, SpO2 99 %, unknown if currently breastfeeding.  Physical Exam:  General: alert, cooperative, and no distress Lochia: appropriate Uterine Fundus: firm Incision: n/a DVT Evaluation: No evidence of DVT seen on physical exam.  Recent Labs    07/22/21 1816 07/23/21 0507  HGB 12.0 11.5*  HCT 35.3* 34.0*    Assessment/Plan: Plan for discharge tomorrow Received procardia 30xl at 930 this am - will recheck BP now and if still elevated will increase procardia dose PreE precautions Routine pp care    LOS: 1 day   Juana Montini W Jazminn Pomales 07/23/2021, 12:17 PM

## 2021-07-24 MED ORDER — IBUPROFEN 800 MG PO TABS: 800.0000 mg | ORAL_TABLET | Freq: Three times a day (TID) | ORAL | 1 refills | Status: DC | PRN

## 2021-07-24 MED ORDER — NIFEDIPINE ER OSMOTIC RELEASE 30 MG PO TB24
30.0000 mg | ORAL_TABLET | Freq: Two times a day (BID) | ORAL | Status: DC
  Administered 2021-07-24: 30 mg via ORAL
  Filled 2021-07-24: qty 1

## 2021-07-24 MED ORDER — NIFEDIPINE ER 30 MG PO TB24: 30.0000 mg | ORAL_TABLET | Freq: Two times a day (BID) | ORAL | 0 refills | Status: DC

## 2021-07-24 NOTE — Discharge Summary (Signed)
Postpartum Discharge Summary  Date of Service updated     Patient Name: Carmen Wall DOB: [birth year] MRN: [id number]  Date of admission: 07/22/2021 Delivery date:07/22/2021  Delivering provider: Jerelyn Charles  Date of discharge: 07/24/2021  Admitting diagnosis: Gestational hypertension w/o significant proteinuria in 3rd trimester [O13.3] Intrauterine pregnancy: [redacted]w[redacted]d     Secondary diagnosis:  Principal Problem:   Gestational hypertension w/o significant proteinuria in 3rd trimester  Additional problems: GMI >50, depression    Discharge diagnosis: Term Pregnancy Delivered and Gestational Hypertension                                              Post partum procedures: none Augmentation: Cytotec Complications: None  Hospital course: Induction of Labor With Vaginal Delivery   31 y.o. yo G2P2002 at [redacted]w[redacted]d was admitted to the hospital 07/22/2021 for induction of labor.  Indication for induction: Gestational hypertension.  Patient had an uncomplicated labor course as follows: Membrane Rupture Time/Date: 1:48 PM ,07/22/2021   Delivery Method:Vaginal, Spontaneous  Episiotomy: None  Lacerations:  Vaginal  Details of delivery can be found in separate delivery note.  Patient had a routine postpartum course. Patient is discharged home 07/24/21.  Newborn Data: Birth date:07/22/2021  Birth time:5:35 PM  Gender:Female  Living status:Living  Apgars:7 ,9  Weight:2885 g   Physical exam  Vitals:   07/23/21 2313 07/24/21 0035 07/24/21 0454 07/24/21 0900  BP: (!) 149/81 (!) 149/84 117/75 124/79  Pulse: 73 70 70 67  Resp: 18  18   Temp:   97.7 F (36.5 C) (!) 97.5 F (36.4 C)  TempSrc:   Oral Oral  SpO2: 99% 99% 100% 100%  Weight:      Height:       General: alert, cooperative, and no distress Lochia: appropriate Uterine Fundus: firm Incision: N/A DVT Evaluation: No evidence of DVT seen on physical exam. Negative Homan's sign. No cords or calf  tenderness. Labs: Lab Results  Component Value Date   WBC 7.3 07/23/2021   HGB 11.5 (L) 07/23/2021   HCT 34.0 (L) 07/23/2021   MCV 92.6 07/23/2021   PLT 164 07/23/2021   CMP Latest Ref Rng & Units 07/22/2021  Glucose 70 - 99 mg/dL 78  BUN 6 - 20 mg/dL 7  Creatinine 0.44 - 1.00 mg/dL 0.64  Sodium 135 - 145 mmol/L 139  Potassium 3.5 - 5.1 mmol/L 4.1  Chloride 98 - 111 mmol/L 106  CO2 22 - 32 mmol/L 25  Calcium 8.9 - 10.3 mg/dL 8.8(L)  Total Protein 6.5 - 8.1 g/dL 5.9(L)  Total Bilirubin 0.3 - 1.2 mg/dL 0.7  Alkaline Phos 38 - 126 U/L 178(H)  AST 15 - 41 U/L 27  ALT 0 - 44 U/L 29   Edinburgh Score: Edinburgh Postnatal Depression Scale Screening Tool 07/23/2021  I have been able to laugh and see the funny side of things. 0  I have looked forward with enjoyment to things. 0  I have blamed myself unnecessarily when things went wrong. 1  I have been anxious or worried for no good reason. 2  I have felt scared or panicky for no good reason. 0  Things have been getting on top of me. 1  I have been so unhappy that I have had difficulty sleeping. 0  I have felt sad or miserable. 0  I have been so unhappy  that I have been crying. 0  The thought of harming myself has occurred to me. 0  Edinburgh Postnatal Depression Scale Total 4      After visit meds:  Allergies as of 07/24/2021   Not on File      Medication List     STOP taking these medications    hydrOXYzine 25 MG tablet Commonly known as: ATARAX       TAKE these medications    albuterol 108 (90 Base) MCG/ACT inhaler Commonly known as: VENTOLIN HFA Inhale into the lungs every 6 (six) hours as needed for wheezing or shortness of breath.   ibuprofen 800 MG tablet Commonly known as: ADVIL Take 1 tablet (800 mg total) by mouth every 8 (eight) hours as needed.   NIFEdipine 30 MG 24 hr tablet Commonly known as: ADALAT CC Take 1 tablet (30 mg total) by mouth 2 (two) times daily.   PRENATAL VITAMIN PO Take by  mouth.         Discharge home in stable condition Infant Feeding: Bottle Infant Disposition:home with mother Discharge instruction: per After Visit Summary and Postpartum booklet. Activity: Advance as tolerated. Pelvic rest for 6 weeks.  Diet: routine diet Anticipated Birth Control: Unsure Postpartum Appointment:6 weeks Additional Postpartum F/U: BP check 1 week Future Appointments: Future Appointments  Date Time Provider [place]  07/26/2021 10:15 AM Leveda Anna, NP [facility]   Follow up Visit: [facility]    07/24/2021 Deliah Boston, MD

## 2021-07-24 NOTE — Progress Notes (Signed)
Post Partum Day 2 Subjective: no complaints, up ad lib, voiding, tolerating PO, + flatus, and lochia mild. Pt reports no HA, CP or SOB. She is bonding well with baby - bottlefeeding.   Objective: Blood pressure 117/75, pulse 70, temperature 97.7 F (36.5 C), temperature source Oral, resp. rate 18, height 5\' 7"  (1.702 m), weight (!) 152.6 kg, last menstrual period 10/31/2020, SpO2 100 %, unknown if currently breastfeeding.  Physical Exam:  General: alert, cooperative, and no distress Lochia: appropriate Uterine Fundus: firm Incision: n/a DVT Evaluation: No evidence of DVT seen on physical exam.  Recent Labs    07/22/21 1816 07/23/21 0507  HGB 12.0 11.5*  HCT 35.3* 34.0*     Assessment/Plan: Discharge home BP improved on PO porcardia 30XL BID - continue meds and have BP check in 1wk Routine pp care    LOS: 2 days   07/25/21 07/24/2021, 8:18 AM

## 2021-07-26 ENCOUNTER — Encounter: Payer: Managed Care, Other (non HMO) | Admitting: Pediatrics

## 2021-08-03 ENCOUNTER — Telehealth (HOSPITAL_COMMUNITY): Payer: Self-pay

## 2021-08-03 NOTE — Telephone Encounter (Signed)
No answer. Left message to return nurse call.  Marcelino Duster [facility] 08/03/2021,1455

## 2021-11-09 ENCOUNTER — Ambulatory Visit (INDEPENDENT_AMBULATORY_CARE_PROVIDER_SITE_OTHER): Payer: Managed Care, Other (non HMO) | Admitting: Nurse Practitioner

## 2021-11-09 ENCOUNTER — Encounter: Payer: Self-pay | Admitting: Nurse Practitioner

## 2021-11-09 VITALS — BP 138/80 | HR 77 | Temp 97.7°F | Ht 67.0 in | Wt 306.0 lb

## 2021-11-09 DIAGNOSIS — M791 Myalgia, unspecified site: Secondary | ICD-10-CM

## 2021-11-09 DIAGNOSIS — R739 Hyperglycemia, unspecified: Secondary | ICD-10-CM | POA: Diagnosis not present

## 2021-11-09 DIAGNOSIS — D500 Iron deficiency anemia secondary to blood loss (chronic): Secondary | ICD-10-CM

## 2021-11-09 DIAGNOSIS — Z8759 Personal history of other complications of pregnancy, childbirth and the puerperium: Secondary | ICD-10-CM

## 2021-11-09 DIAGNOSIS — E559 Vitamin D deficiency, unspecified: Secondary | ICD-10-CM

## 2021-11-09 LAB — TSH: TSH: 1.21 u[IU]/mL (ref 0.35–5.50)

## 2021-11-09 LAB — COMPREHENSIVE METABOLIC PANEL
ALT: 19 U/L (ref 0–35)
AST: 16 U/L (ref 0–37)
Albumin: 4.1 g/dL (ref 3.5–5.2)
Alkaline Phosphatase: 64 U/L (ref 39–117)
BUN: 9 mg/dL (ref 6–23)
CO2: 26 mEq/L (ref 19–32)
Calcium: 9.1 mg/dL (ref 8.4–10.5)
Chloride: 105 mEq/L (ref 96–112)
Creatinine, Ser: 0.72 mg/dL (ref 0.40–1.20)
GFR: 110.34 mL/min (ref >60.00)
Glucose, Bld: 86 mg/dL (ref 70–99)
Potassium: 3.8 mEq/L (ref 3.5–5.1)
Sodium: 139 mEq/L (ref 135–145)
Total Bilirubin: 0.6 mg/dL (ref 0.2–1.2)
Total Protein: 6.6 g/dL (ref 6.0–8.3)

## 2021-11-09 LAB — VITAMIN D 25 HYDROXY (VIT D DEFICIENCY, FRACTURES): VITD: 25.33 ng/mL — ABNORMAL LOW (ref 30.00–100.00)

## 2021-11-09 LAB — CBC WITH DIFFERENTIAL/PLATELET
Basophils Absolute: 0 10*3/uL (ref 0.0–0.1)
Basophils Relative: 0.8 % (ref 0.0–3.0)
Eosinophils Absolute: 0.1 10*3/uL (ref 0.0–0.7)
Eosinophils Relative: 1.9 % (ref 0.0–5.0)
HCT: 41 % (ref 36.0–46.0)
Hemoglobin: 13.6 g/dL (ref 12.0–15.0)
Lymphocytes Relative: 37 % (ref 12.0–46.0)
Lymphs Abs: 1.8 10*3/uL (ref 0.7–4.0)
MCHC: 33.1 g/dL (ref 30.0–36.0)
MCV: 88.5 fl (ref 78.0–100.0)
Monocytes Absolute: 0.3 10*3/uL (ref 0.1–1.0)
Monocytes Relative: 6.1 % (ref 3.0–12.0)
Neutro Abs: 2.7 10*3/uL (ref 1.4–7.7)
Neutrophils Relative %: 54.2 % (ref 43.0–77.0)
Platelets: 204 10*3/uL (ref 150.0–400.0)
RBC: 4.64 Mil/uL (ref 3.87–5.11)
RDW: 14 % (ref 11.5–15.5)
WBC: 4.9 10*3/uL (ref 4.0–10.5)

## 2021-11-09 LAB — C-REACTIVE PROTEIN: CRP: <1 mg/dL (ref 0.5–20.0)

## 2021-11-09 LAB — SEDIMENTATION RATE: Sed Rate: 18 mm/hr (ref 0–20)

## 2021-11-09 LAB — HEMOGLOBIN A1C: Hgb A1c MFr Bld: 5.1 % (ref 4.6–6.5)

## 2021-11-09 NOTE — Progress Notes (Unsigned)
Established Patient Visit  Patient: Carmen Wall   DOB: 1990/08/26   31 y.o. Female  MRN: [id number] Visit Date: 11/10/2021  Subjective:    Chief Complaint  Patient presents with   Acute Visit    C/o constant pain in legs & feet x 2 weeks Body feels "stiff & "tight" in the morning when she wakes up.   HPI Vitamin D deficiency Reports fatigue and myalgia Repeat vit D: loww Sent 50000IU weekly x 12weeks  Myalgia Chronic, intermittent , unknown cause, each episode may last 44month, Then resolves spontaneously, this episode started 2weeks ago. Associated with chronic fatigue and skin sensitivity. She does endorse increased stress due to return to work after maternity leave. 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  Check cbc, iron, hgbA1c, cmp, tsh, ESR, ANA and CRP: normal  No physical limitation noted during exam. No rash, no joint swelling or redness Possibly due to depression and depression. Consider use of SSRI or SNRI?  History of gestational hypertension HTN with pregnancy, treated with procardia, d/c 125monthfter childbirth. Advised to monitor BP at home Maintain DASH diet BP Readings from Last 3 Encounters:  11/09/21 138/80  07/24/21 124/79  07/15/21 132/74    Iron deficiency anemia due to chronic blood loss Reports irregular cycle, bleeding every 2weeks, last 4-6days each, no clots. Repeat cbc and iron panel: normal  Reviewed medical, surgical, and social history today  Medications: Outpatient Medications Prior to Visit  Medication Sig   albuterol (VENTOLIN HFA) 108 (90 Base) MCG/ACT inhaler Inhale into the lungs every 6 (six) hours as needed for wheezing or shortness of breath.   ibuprofen (ADVIL) 800 MG tablet Take 1 tablet (800 mg total) by mouth every 8 (eight) hours as needed.   norethindrone (MICRONOR) 0.35 MG tablet Take 1 tablet by mouth daily.   Prenatal Vit-Fe Fumarate-FA (PRENATAL VITAMIN PO) Take by mouth.   [DISCONTINUED]  NIFEdipine (ADALAT CC) 30 MG 24 hr tablet Take 1 tablet (30 mg total) by mouth 2 (two) times daily.   No facility-administered medications prior to visit.   Reviewed past medical and social history.   ROS per HPI above      Objective:  BP 138/80 (BP Location: Right Arm, Patient Position: Sitting, Cuff Size: Normal)   Pulse 77   Temp 97.7 F (36.5 C) (Temporal)   Ht _0  (1.702 m)   Wt (!) 306 lb (138.8 kg)   LMP 10/28/2021 (Exact Date) Comment: irregular  SpO2 99%   Breastfeeding No   BMI 47.93 kg/m      Physical Exam Constitutional:      Appearance: She is obese.  Cardiovascular:     Rate and Rhythm: Normal rate and regular rhythm.     Pulses: Normal pulses.     Heart sounds: Normal heart sounds.  Pulmonary:     Effort: Pulmonary effort is normal.     Breath sounds: Normal breath sounds.  Abdominal:     General: Bowel sounds are normal.     Palpations: Abdomen is soft.  Musculoskeletal:        General: No swelling or tenderness.     Right lower leg: No edema.     Left lower leg: No edema.  Skin:    General: Skin is warm and dry.     Findings: No erythema or rash.  Neurological:     Mental Status: She is alert and oriented to  person, place, and time.  Psychiatric:        Mood and Affect: Mood normal.        Behavior: Behavior normal.        Thought Content: Thought content normal.    Results for orders placed or performed in visit on 11/09/21  Comprehensive metabolic panel  Result Value Ref Range   Sodium 139 135 - 145 mEq/L   Potassium 3.8 3.5 - 5.1 mEq/L   Chloride 105 96 - 112 mEq/L   CO2 26 19 - 32 mEq/L   Glucose, Bld 86 70 - 99 mg/dL   BUN 9 6 - 23 mg/dL   Creatinine, Ser 0.72 0.40 - 1.20 mg/dL   Total Bilirubin 0.6 0.2 - 1.2 mg/dL   Alkaline Phosphatase 64 39 - 117 U/L   AST 16 0 - 37 U/L   ALT 19 0 - 35 U/L   Total Protein 6.6 6.0 - 8.3 g/dL   Albumin 4.1 3.5 - 5.2 g/dL   GFR 110.34 >60.00 mL/min   Calcium 9.1 8.4 - 10.5 mg/dL  CBC with  Differential/Platelet  Result Value Ref Range   WBC 4.9 4.0 - 10.5 K/uL   RBC 4.64 3.87 - 5.11 Mil/uL   Hemoglobin 13.6 12.0 - 15.0 g/dL   HCT 41.0 36.0 - 46.0 %   MCV 88.5 78.0 - 100.0 fl   MCHC 33.1 30.0 - 36.0 g/dL   RDW 14.0 11.5 - 15.5 %   Platelets 204.0 150.0 - 400.0 K/uL   Neutrophils Relative % 54.2 43.0 - 77.0 %   Lymphocytes Relative 37.0 12.0 - 46.0 %   Monocytes Relative 6.1 3.0 - 12.0 %   Eosinophils Relative 1.9 0.0 - 5.0 %   Basophils Relative 0.8 0.0 - 3.0 %   Neutro Abs 2.7 1.4 - 7.7 K/uL   Lymphs Abs 1.8 0.7 - 4.0 K/uL   Monocytes Absolute 0.3 0.1 - 1.0 K/uL   Eosinophils Absolute 0.1 0.0 - 0.7 K/uL   Basophils Absolute 0.0 0.0 - 0.1 K/uL  Iron, TIBC and Ferritin Panel  Result Value Ref Range   Iron 80 40 - 190 mcg/dL   TIBC 326 250 - 450 mcg/dL (calc)   %SAT 25 16 - 45 % (calc)   Ferritin 38 16 - 154 ng/mL  TSH  Result Value Ref Range   TSH 1.21 0.35 - 5.50 uIU/mL  Vitamin D (25 hydroxy)  Result Value Ref Range   VITD 25.33 (L) 30.00 - 100.00 ng/mL  Sedimentation rate  Result Value Ref Range   Sed Rate 18 0 - 20 mm/hr  C-reactive protein  Result Value Ref Range   CRP <1.0 0.5 - 20.0 mg/dL  ANA w/Reflex  Result Value Ref Range   Anti Nuclear Antibody (ANA) Negative Negative  Hemoglobin A1c  Result Value Ref Range   Hgb A1c MFr Bld 5.1 4.6 - 6.5 %      Assessment & Plan:    Problem List Items Addressed This Visit       Other   History of gestational hypertension    HTN with pregnancy, treated with procardia, d/c 79monthafter childbirth. Advised to monitor BP at home Maintain DASH diet BP Readings from Last 3 Encounters:  11/09/21 138/80  07/24/21 124/79  07/15/21 132/74       Iron deficiency anemia due to chronic blood loss - Primary    Reports irregular cycle, bleeding every 2weeks, last 4-6days each, no clots. Repeat cbc and iron panel: normal  Relevant Orders   CBC with Differential/Platelet (Completed)   Iron, TIBC and  Ferritin Panel (Completed)   Myalgia    Chronic, intermittent , unknown cause, each episode may last 35month, Then resolves spontaneously, this episode started 2weeks ago. Associated with chronic fatigue and skin sensitivity. She does endorse increased stress due to return to work after maternity leave. 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  Check cbc, iron, hgbA1c, cmp, tsh, ESR, ANA and CRP: normal  No physical limitation noted during exam. No rash, no joint swelling or redness Possibly due to depression and depression. Consider use of SSRI or SNRI?       Relevant Orders   Comprehensive metabolic panel (Completed)   TSH (Completed)   Sedimentation rate (Completed)   C-reactive protein (Completed)   ANA w/Reflex (Completed)   Vitamin D deficiency    Reports fatigue and myalgia Repeat vit D: loww Sent 50000IU weekly x 12weeks       Relevant Medications   Vitamin D, Ergocalciferol, (DRISDOL) 1.25 MG (50000 UNIT) CAPS capsule   Other Relevant Orders   Vitamin D (25 hydroxy) (Completed)   Other Visit Diagnoses     Hyperglycemia       Relevant Orders   Comprehensive metabolic panel (Completed)   Hemoglobin A1c (Completed)      Return if symptoms worsen or fail to improve.     CWilfred Lacy NP

## 2021-11-09 NOTE — Patient Instructions (Addendum)
Go to lab. Continue to monitor BP 2-3x/week in AM Call office if BP>140/90. Maintain heart healthy diet and daily exercise.

## 2021-11-10 ENCOUNTER — Encounter: Payer: Self-pay | Admitting: Nurse Practitioner

## 2021-11-10 DIAGNOSIS — M791 Myalgia, unspecified site: Secondary | ICD-10-CM | POA: Insufficient documentation

## 2021-11-10 DIAGNOSIS — D500 Iron deficiency anemia secondary to blood loss (chronic): Secondary | ICD-10-CM | POA: Insufficient documentation

## 2021-11-10 DIAGNOSIS — M7918 Myalgia, other site: Secondary | ICD-10-CM | POA: Insufficient documentation

## 2021-11-10 DIAGNOSIS — G8929 Other chronic pain: Secondary | ICD-10-CM | POA: Insufficient documentation

## 2021-11-10 LAB — IRON,TIBC AND FERRITIN PANEL
%SAT: 25 % (calc) (ref 16–45)
Ferritin: 38 ng/mL (ref 16–154)
Iron: 80 ug/dL (ref 40–190)
TIBC: 326 mcg/dL (calc) (ref 250–450)

## 2021-11-10 LAB — ANA W/REFLEX: Anti Nuclear Antibody (ANA): NEGATIVE

## 2021-11-10 MED ORDER — VITAMIN D (ERGOCALCIFEROL) 1.25 MG (50000 UNIT) PO CAPS: 50000.0000 [IU] | ORAL_CAPSULE | ORAL | 0 refills | Status: DC

## 2021-11-10 NOTE — Assessment & Plan Note (Signed)
Reports irregular cycle, bleeding every 2weeks, last 4-6days each, no clots. Repeat cbc and iron panel: normal

## 2021-11-10 NOTE — Assessment & Plan Note (Signed)
HTN with pregnancy, treated with procardia, d/c 39month after childbirth. Advised to monitor BP at home Maintain DASH diet BP Readings from Last 3 Encounters:  11/09/21 138/80  07/24/21 124/79  07/15/21 132/74

## 2021-11-10 NOTE — Assessment & Plan Note (Addendum)
Chronic, intermittent , unknown cause, each episode may last 27month, Then resolves spontaneously, this episode started 2weeks ago. Associated with chronic fatigue and skin sensitivity. She does endorse increased stress due to return to work after maternity leave. 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  Check cbc, iron, hgbA1c, cmp, tsh, ESR, ANA and CRP: normal  No physical limitation noted during exam. No rash, no joint swelling or redness Possibly due to depression and depression. Consider use of SSRI or SNRI?

## 2021-11-10 NOTE — Assessment & Plan Note (Signed)
Reports fatigue and myalgia Repeat vit D: loww Sent 50000IU weekly x 12weeks

## 2021-11-10 NOTE — Assessment & Plan Note (Signed)
>>  ASSESSMENT AND PLAN FOR MYALGIA WRITTEN ON 11/10/2021  4:07 PM BY Cherese Lozano LUM, NP  Chronic, intermittent , unknown cause, each episode may last 1months, Then resolves spontaneously, this episode started 2weeks ago. Associated with chronic fatigue and skin sensitivity. She does endorse increased stress due to return to work after maternity leave. 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  Check cbc, iron, hgbA1c, cmp, tsh, ESR, ANA and CRP: normal  No physical limitation noted during exam. No rash, no joint swelling or redness Possibly due to depression and depression. Consider use of SSRI or SNRI?

## 2021-12-16 ENCOUNTER — Ambulatory Visit: Payer: Managed Care, Other (non HMO) | Admitting: Nurse Practitioner

## 2021-12-16 ENCOUNTER — Encounter: Payer: Self-pay | Admitting: Nurse Practitioner

## 2021-12-16 VITALS — BP 126/86 | HR 90 | Temp 96.9°F | Ht 67.0 in | Wt 304.2 lb

## 2021-12-16 DIAGNOSIS — F411 Generalized anxiety disorder: Secondary | ICD-10-CM | POA: Diagnosis not present

## 2021-12-16 DIAGNOSIS — E559 Vitamin D deficiency, unspecified: Secondary | ICD-10-CM

## 2021-12-16 DIAGNOSIS — F332 Major depressive disorder, recurrent severe without psychotic features: Secondary | ICD-10-CM | POA: Diagnosis not present

## 2021-12-16 MED ORDER — VITAMIN D (ERGOCALCIFEROL) 1.25 MG (50000 UNIT) PO CAPS: 50000.0000 [IU] | ORAL_CAPSULE | ORAL | 0 refills | Status: DC

## 2021-12-16 MED ORDER — AMITRIPTYLINE HCL 10 MG PO TABS: 10.0000 mg | ORAL_TABLET | Freq: Every day | ORAL | 5 refills | Status: DC

## 2021-12-16 NOTE — Progress Notes (Signed)
Established Patient Visit  Patient: Carmen Wall   DOB: Dec 08, 1990   31 y.o. Female  MRN: [id number] Visit Date: 12/16/2021  Subjective:    Chief Complaint  Patient presents with   Acute Visit    C/o having emotional spells x 2 weeks Had baby in February & now she is feeling the postpartum depression. No other concerns    Accompanied by her husband.  HPI Depression, recurrent (HCC) Worsening mood postpartum x 44month: labile mood-crying to irritability, difficulty with concentration, racing thoughts, and sleep disturbance. Reports increased stress with home and work responsibilities. She lives with husband and 2children, reports her family is suportive Denies any mood disturbance during pregnancy and 1st 56months postpartum. Denies any SI/HI or hallucinations. Previous use of buspar, trazodone, wellbutrin and lexapro before pregnancy. Unsure if meds were beneficial at this time. meds discontinuation when she became pregnant and did not feel the need for med during pregnancy. previous CBT sessions, she discontinued 2021.  We discussed use of elavil and possible side effects. She agreed to start med and to psychology referral. Advised to contact [facility] counselor. Sent elavil 10mg  at hs F/up in 18month     12/16/2021    2:33 PM 06/12/2020    8:34 AM 07/03/2019   11:08 AM  Depression screen PHQ 2/9  Decreased Interest 2 1 2   Down, Depressed, Hopeless 3 0 3  PHQ - 2 Score 5 1 5   Altered sleeping 3 3 2   Tired, decreased energy 2 3 2   Change in appetite 1 2 3   Feeling bad or failure about yourself  1 0 2  Trouble concentrating 2 2 1   Moving slowly or fidgety/restless 2 2 0  Suicidal thoughts 0 0 0  PHQ-9 Score 16 13 15   Difficult doing work/chores Somewhat difficult Somewhat difficult Extremely dIfficult       12/16/2021    2:33 PM 06/12/2020    8:34 AM 03/13/2019    9:51 AM 08/24/2018    8:27 AM  GAD 7 : Generalized Anxiety Score  Nervous, Anxious,  on Edge 3 1 1 1   Control/stop worrying 3 0 1 0  Worry too much - different things 3 1 1  0  Trouble relaxing 2 0 0 1  Restless 1 2 0 0  Easily annoyed or irritable 3 1 1 1   Afraid - awful might happen 1 0 1 0  Total GAD 7 Score 16 5 5 3   Anxiety Difficulty Very difficult Somewhat difficult     Reviewed medical, surgical, and social history today  Medications: Outpatient Medications Prior to Visit  Medication Sig   albuterol (VENTOLIN HFA) 108 (90 Base) MCG/ACT inhaler Inhale into the lungs every 6 (six) hours as needed for wheezing or shortness of breath.   ibuprofen (ADVIL) 800 MG tablet Take 1 tablet (800 mg total) by mouth every 8 (eight) hours as needed.   norethindrone (MICRONOR) 0.35 MG tablet Take 1 tablet by mouth daily.   [DISCONTINUED] Prenatal Vit-Fe Fumarate-FA (PRENATAL VITAMIN PO) Take by mouth. (Patient not taking: Reported on 12/16/2021)   [DISCONTINUED] Vitamin D, Ergocalciferol, (DRISDOL) 1.25 MG (50000 UNIT) CAPS capsule Take 1 capsule (50,000 Units total) by mouth every 7 (seven) days. (Patient not taking: Reported on 12/16/2021)   No facility-administered medications prior to visit.   Reviewed past medical and social history.   ROS per HPI above      Objective:  BP 126/86 (  BP Location: Right Arm, Patient Position: Sitting, Cuff Size: Normal)   Pulse 90   Temp (!) 96.9 F (36.1 C) (Temporal)   Ht 5\' 7"  (1.702 m)   Wt (!) 304 lb 3.2 oz (138 kg)   SpO2 97%   Breastfeeding No   BMI 47.64 kg/m      Physical Exam  No results found for any visits on 12/16/21.    Assessment & Plan:    Problem List Items Addressed This Visit       Other   Depression, recurrent (HCC) - Primary    Worsening mood postpartum x 84month: labile mood-crying to irritability, difficulty with concentration, racing thoughts, and sleep disturbance. Reports increased stress with home and work responsibilities. She lives with husband and 2children, reports her family is  suportive Denies any mood disturbance during pregnancy and 1st 89months postpartum. Denies any SI/HI or hallucinations. Previous use of buspar, trazodone, wellbutrin and lexapro before pregnancy. Unsure if meds were beneficial at this time. meds discontinuation when she became pregnant and did not feel the need for med during pregnancy. previous CBT sessions, she discontinued 2021.  We discussed use of elavil and possible side effects. She agreed to start med and to psychology referral. Advised to contact [facility] counselor. Sent elavil 10mg  at hs F/up in 61month      Relevant Medications   amitriptyline (ELAVIL) 10 MG tablet   GAD (generalized anxiety disorder)   Relevant Medications   amitriptyline (ELAVIL) 10 MG tablet   Other Relevant Orders   Ambulatory referral to Psychology   Vitamin D deficiency   Relevant Medications   Vitamin D, Ergocalciferol, (DRISDOL) 1.25 MG (50000 UNIT) CAPS capsule   Return in about 4 weeks (around 01/13/2022) for anxiety and depression.     2month, NP

## 2021-12-16 NOTE — Assessment & Plan Note (Signed)
Worsening mood postpartum x 66month: labile mood-crying to irritability, difficulty with concentration, racing thoughts, and sleep disturbance. Reports increased stress with home and work responsibilities. She lives with husband and 2children, reports her family is suportive Denies any mood disturbance during pregnancy and 1st 42months postpartum. Denies any SI/HI or hallucinations. Previous use of buspar, trazodone, wellbutrin and lexapro before pregnancy. Unsure if meds were beneficial at this time. meds discontinuation when she became pregnant and did not feel the need for med during pregnancy. previous CBT sessions, she discontinued 2021.  We discussed use of elavil and possible side effects. She agreed to start med and to psychology referral. Advised to contact [facility] counselor. Sent elavil 10mg  at hs F/up in 3month

## 2021-12-16 NOTE — Patient Instructions (Signed)
Start elavil 10mg  at bedtime x 2weeks, then increase to 20mg  at bedtime continuously Schedule appt with [facility] counselor while waiting for appt with [facility] counselor.  Managing Depression, Adult Depression is a mental health condition that affects your thoughts, feelings, and actions. Being diagnosed with depression can bring you relief if you did not know why you have felt or behaved a certain way. It could also leave you feeling overwhelmed with uncertainty about your future. Preparing yourself to manage your symptoms can help you feel more positive about your future. How to manage lifestyle changes Managing stress  Stress is your body's reaction to life changes and events, both good and bad. Stress can add to your feelings of depression. Learning to manage your stress can help lessen your feelings of depression. Try some of the following approaches to reducing your stress (stress reduction techniques): Listen to music that you enjoy and that inspires you. Try using a meditation app or take a meditation class. Develop a practice that helps you connect with your spiritual self. Walk in nature, pray, or go to a place of worship. Do some deep breathing. To do this, inhale slowly through your nose. Pause at the top of your inhale for a few seconds and then exhale slowly, letting your muscles relax. Practice yoga to help relax and work your muscles. Choose a stress reduction technique that suits your lifestyle and personality. These techniques take time and practice to develop. Set aside 5-15 minutes a day to do them. Therapists can offer training in these techniques. Other things you can do to manage stress include: Keeping a stress diary. Knowing your limits and saying no when you think something is too much. Paying attention to how you react to certain situations. You may not be able to control everything, but you can change your reaction. Adding humor to your life by watching funny films or TV  shows. Making time for activities that you enjoy and that relax you.  Medicines Medicines, such as antidepressants, are often a part of treatment for depression. Talk with your pharmacist or health care provider about all the medicines, supplements, and herbal products that you take, their possible side effects, and what medicines and other products are safe to take together. Make sure to report any side effects you may have to your health care provider. Relationships Your health care provider may suggest family therapy, couples therapy, or individual therapy as part of your treatment. How to recognize changes Everyone responds differently to treatment for depression. As you recover from depression, you may start to: Have more interest in doing activities. Feel less hopeless. Have more energy. Overeat less often, or have a better appetite. Have better mental focus. It is important to recognize if your depression is not getting better or is getting worse. The symptoms you had in the beginning may return, such as: Tiredness (fatigue) or low energy. Eating too much or too little. Sleeping too much or too little. Feeling restless, agitated, or hopeless. Trouble focusing or making decisions. Unexplained physical complaints. Feeling irritable, angry, or aggressive. If you or your family members notice these symptoms coming back, let your health care provider know right away. Follow these instructions at home: Activity  Try to get some form of exercise each day, such as walking, biking, swimming, or lifting weights. Practice stress reduction techniques. Engage your mind by taking a class or doing some volunteer work. Lifestyle Get the right amount and quality of sleep. Cut down on using caffeine, tobacco, alcohol,  and other potentially harmful substances. Eat a healthy diet that includes plenty of vegetables, fruits, whole grains, low-fat dairy products, and lean protein. Do not eat a lot  of foods that are high in solid fats, added sugars, or salt (sodium). General instructions Take over-the-counter and prescription medicines only as told by your health care provider. Keep all follow-up visits as told by your health care provider. This is important. Where to find support Talking to others  Friends and family members can be sources of support and guidance. Talk to trusted friends or family members about your condition. Explain your symptoms to them, and let them know that you are working with a health care provider to treat your depression. Tell friends and family members how they also can be helpful. Finances Find appropriate mental health providers that fit with your financial situation. Talk with your health care provider about options to get reduced prices on your medicines. Where to find more information You can find support in your area from: Anxiety and Depression Association of America (ADAA): www.adaa.org Mental Health America: www.mentalhealthamerica.net The [employer] on Mental Illness: www.nami.org Contact a health care provider if: You stop taking your antidepressant medicines, and you have any of these symptoms: Nausea. Headache. Light-headedness. Chills and body aches. Not being able to sleep (insomnia). You or your friends and family think your depression is getting worse. Get help right away if: You have thoughts of hurting yourself or others. If you ever feel like you may hurt yourself or others, or have thoughts about taking your own life, get help right away. Go to your nearest emergency department or: Call your local emergency services (911 in the [place] Call a suicide crisis helpline, such as the National Suicide Prevention Lifeline at 270-879-7277 or 988 in the [place] This is open 24 hours a day in the [place] Text the Crisis Text Line at 515-487-2796 (in the [place] Summary If you are diagnosed with depression, preparing yourself to manage your symptoms  is a good way to feel positive about your future. Work with your health care provider on a management plan that includes stress reduction techniques, medicines (if applicable), therapy, and healthy lifestyle habits. Keep talking with your health care provider about how your treatment is working. If you have thoughts about taking your own life, call a suicide crisis helpline or text a crisis text line. This information is not intended to replace advice given to you by your health care provider. Make sure you discuss any questions you have with your health care provider. Document Revised: 12/16/2020 Document Reviewed: 04/03/2019 Elsevier Patient Education  2023 Elsevier Inc.   Managing Anxiety, Adult After being diagnosed with anxiety, you may be relieved to know why you have felt or behaved a certain way. You may also feel overwhelmed about the treatment ahead and what it will mean for your life. With care and support, you can manage this condition. How to manage lifestyle changes Managing stress and anxiety  Stress is your body's reaction to life changes and events, both good and bad. Most stress will last just a few hours, but stress can be ongoing and can lead to more than just stress. Although stress can play a major role in anxiety, it is not the same as anxiety. Stress is usually caused by something external, such as a deadline, test, or competition. Stress normally passes after the triggering event has ended.  Anxiety is caused by something internal, such as imagining a terrible outcome or worrying that something  will go wrong that will devastate you. Anxiety often does not go away even after the triggering event is over, and it can become long-term (chronic) worry. It is important to understand the differences between stress and anxiety and to manage your stress effectively so that it does not lead to an anxious response. Talk with your health care provider or a counselor to learn more about  reducing anxiety and stress. He or she may suggest tension reduction techniques, such as: Music therapy. Spend time creating or listening to music that you enjoy and that inspires you. Mindfulness-based meditation. Practice being aware of your normal breaths while not trying to control your breathing. It can be done while sitting or walking. Centering prayer. This involves focusing on a word, phrase, or sacred image that means something to you and brings you peace. Deep breathing. To do this, expand your stomach and inhale slowly through your nose. Hold your breath for 3-5 seconds. Then exhale slowly, letting your stomach muscles relax. Self-talk. Learn to notice and identify thought patterns that lead to anxiety reactions and change those patterns to thoughts that feel peaceful. Muscle relaxation. Taking time to tense muscles and then relax them. Choose a tension reduction technique that fits your lifestyle and personality. These techniques take time and practice. Set aside 5-15 minutes a day to do them. Therapists can offer counseling and training in these techniques. The training to help with anxiety may be covered by some insurance plans. Other things you can do to manage stress and anxiety include: Keeping a stress diary. This can help you learn what triggers your reaction and then learn ways to manage your response. Thinking about how you react to certain situations. You may not be able to control everything, but you can control your response. Making time for activities that help you relax and not feeling guilty about spending your time in this way. Doing visual imagery. This involves imagining or creating mental pictures to help you relax. Practicing yoga. Through yoga poses, you can lower tension and promote relaxation.  Medicines Medicines can help ease symptoms. Medicines for anxiety include: Antidepressant medicines. These are usually prescribed for long-term daily control. Anti-anxiety  medicines. These may be added in severe cases, especially when panic attacks occur. Medicines will be prescribed by a health care provider. When used together, medicines, psychotherapy, and tension reduction techniques may be the most effective treatment. Relationships Relationships can play a big part in helping you recover. Try to spend more time connecting with trusted friends and family members. Consider going to couples counseling if you have a partner, taking family education classes, or going to family therapy. Therapy can help you and others better understand your condition. How to recognize changes in your anxiety Everyone responds differently to treatment for anxiety. Recovery from anxiety happens when symptoms decrease and stop interfering with your daily activities at home or work. This may mean that you will start to: Have better concentration and focus. Worry will interfere less in your daily thinking. Sleep better. Be less irritable. Have more energy. Have improved memory. It is also important to recognize when your condition is getting worse. Contact your health care provider if your symptoms interfere with home or work and you feel like your condition is not improving. Follow these instructions at home: Activity Exercise. Adults should do the following: Exercise for at least 150 minutes each week. The exercise should increase your heart rate and make you sweat (moderate-intensity exercise). Strengthening exercises at least twice a  week. Get the right amount and quality of sleep. Most adults need 7-9 hours of sleep each night. Lifestyle  Eat a healthy diet that includes plenty of vegetables, fruits, whole grains, low-fat dairy products, and lean protein. Do not eat a lot of foods that are high in fats, added sugars, or salt (sodium). Make choices that simplify your life. Do not use any products that contain nicotine or tobacco. These products include cigarettes, chewing  tobacco, and vaping devices, such as e-cigarettes. If you need help quitting, ask your health care provider. Avoid caffeine, alcohol, and certain over-the-counter cold medicines. These may make you feel worse. Ask your pharmacist which medicines to avoid. General instructions Take over-the-counter and prescription medicines only as told by your health care provider. Keep all follow-up visits. This is important. Where to find support You can get help and support from these sources: Self-help groups. Online and [employer]. A trusted spiritual leader. Couples counseling. Family education classes. Family therapy. Where to find more information You may find that joining a support group helps you deal with your anxiety. The following sources can help you locate counselors or support groups near you: Mental Health America: www.mentalhealthamerica.net Anxiety and Depression Association of [place] (ADAA): [employer].de The First [employer] on Mental Illness (NAMI): www.nami.org Contact a health care provider if: You have a hard time staying focused or finishing daily tasks. You spend many hours a day feeling worried about everyday life. You become exhausted by worry. You start to have headaches or frequently feel tense. You develop chronic nausea or diarrhea. Get help right away if: You have a racing heart and shortness of breath. You have thoughts of hurting yourself or others. If you ever feel like you may hurt yourself or others, or have thoughts about taking your own life, get help right away. Go to your nearest emergency department or: Call your local emergency services (911 in the [place] Call a suicide crisis helpline, such as the National Suicide Prevention Lifeline at 838-220-6556 or 988 in the [place] This is open 24 hours a day in the [place] Text the Crisis Text Line at (510) 006-9654 (in the [place] Summary Taking steps to learn and use tension reduction techniques can help calm you  and help prevent triggering an anxiety reaction. When used together, medicines, psychotherapy, and tension reduction techniques may be the most effective treatment. Family, friends, and partners can play a big part in supporting you. This information is not intended to replace advice given to you by your health care provider. Make sure you discuss any questions you have with your health care provider. Document Revised: 12/16/2020 Document Reviewed: 09/13/2020 Elsevier Patient Education  2023 [employer].

## 2021-12-17 ENCOUNTER — Ambulatory Visit: Payer: Managed Care, Other (non HMO) | Admitting: Nurse Practitioner

## 2022-01-12 ENCOUNTER — Ambulatory Visit: Payer: Managed Care, Other (non HMO) | Admitting: Nurse Practitioner

## 2022-01-12 ENCOUNTER — Encounter: Payer: Self-pay | Admitting: Nurse Practitioner

## 2022-01-12 ENCOUNTER — Encounter (INDEPENDENT_AMBULATORY_CARE_PROVIDER_SITE_OTHER): Payer: Self-pay

## 2022-01-12 VITALS — BP 130/82 | HR 83 | Temp 97.1°F | Ht 67.0 in | Wt 308.8 lb

## 2022-01-12 DIAGNOSIS — F332 Major depressive disorder, recurrent severe without psychotic features: Secondary | ICD-10-CM

## 2022-01-12 DIAGNOSIS — G4719 Other hypersomnia: Secondary | ICD-10-CM | POA: Diagnosis not present

## 2022-01-12 DIAGNOSIS — Z6841 Body Mass Index (BMI) 40.0 and over, adult: Secondary | ICD-10-CM | POA: Diagnosis not present

## 2022-01-12 MED ORDER — WEGOVY 0.25 MG/0.5ML ~~LOC~~ SOAJ: 0.2500 mg | SUBCUTANEOUS | 0 refills | Status: DC

## 2022-01-12 MED ORDER — VORTIOXETINE HBR 5 MG PO TABS: 5.0000 mg | ORAL_TABLET | Freq: Every day | ORAL | 5 refills | Status: DC

## 2022-01-12 NOTE — Assessment & Plan Note (Addendum)
Improved sleep quality, but persistent mood swing and excessive daytime somnolence during and after menstrual cycle. Did not schedule appt with EAP.  Advised to schedule appt with psychology Schedule appt with GYN to discuss COC. Entered referral to psychitary to re eval diagnosis and medication. Maintain elavil dose and add trintellix F/up in 38month

## 2022-01-12 NOTE — Assessment & Plan Note (Signed)
Advised to schedule appt with neurology for sleep study.

## 2022-01-12 NOTE — Assessment & Plan Note (Signed)
We discussed use of wegovy injection, possible side effects and contraindications. She verbalized understanding. Advised about the importance of heart healthy diet and regular exercise in combination with wegovy Wt Readings from Last 3 Encounters:  01/12/22 (!) 308 lb 12.8 oz (140.1 kg)  12/16/21 (!) 304 lb 3.2 oz (138 kg)  11/09/21 (!) 306 lb (138.8 kg)   wegovy 0.25mg  sent F/up in 27month

## 2022-01-12 NOTE — Progress Notes (Signed)
Established Patient Visit  Patient: Carmen Wall   DOB: 11-May-1991   31 y.o. Female  MRN: [id number] Visit Date: 01/12/2022  Subjective:    Chief Complaint  Patient presents with   Office Visit    Anxiety/ depression F/u Says she still has good days & bad days  No other concerns    HPI Obesity We discussed use of wegovy injection, possible side effects and contraindications. She verbalized understanding. Advised about the importance of heart healthy diet and regular exercise in combination with wegovy Wt Readings from Last 3 Encounters:  01/12/22 (!) 308 lb 12.8 oz (140.1 kg)  12/16/21 (!) 304 lb 3.2 oz (138 kg)  11/09/21 (!) 306 lb (138.8 kg)   wegovy 0.25mg  sent F/up in 38month  Depression Improved sleep quality, but persistent mood swing and excessive daytime somnolence during and after menstrual cycle. Did not schedule appt with EAP.  Advised to schedule appt with psychology Schedule appt with GYN to discuss COC. Entered referral to psychitary to re eval diagnosis and medication. Maintain elavil dose and add trintellix F/up in 41month  Excessive daytime sleepiness Advised to schedule appt with neurology for sleep study.     01/12/2022    2:02 PM 12/16/2021    2:33 PM 06/12/2020    8:34 AM  Depression screen PHQ 2/9  Decreased Interest 1 2 1   Down, Depressed, Hopeless 1 3 0  PHQ - 2 Score 2 5 1   Altered sleeping 2 3 3   Tired, decreased energy 3 2 3   Change in appetite 1 1 2   Feeling bad or failure about yourself  0 1 0  Trouble concentrating 0 2 2  Moving slowly or fidgety/restless 1 2 2   Suicidal thoughts 0 0 0  PHQ-9 Score 9 16 13   Difficult doing work/chores Somewhat difficult Somewhat difficult Somewhat difficult       01/12/2022    2:02 PM 12/16/2021    2:33 PM 06/12/2020    8:34 AM 03/13/2019    9:51 AM  GAD 7 : Generalized Anxiety Score  Nervous, Anxious, on Edge 1 3 1 1   Control/stop worrying 1 3 0 1  Worry too much -  different things 1 3 1 1   Trouble relaxing 1 2 0 0  Restless 0 1 2 0  Easily annoyed or irritable 2 3 1 1   Afraid - awful might happen 0 1 0 1  Total GAD 7 Score 6 16 5 5   Anxiety Difficulty Somewhat difficult Very difficult Somewhat difficult    Reviewed medical, surgical, and social history today  Medications: Outpatient Medications Prior to Visit  Medication Sig   albuterol (VENTOLIN HFA) 108 (90 Base) MCG/ACT inhaler Inhale into the lungs every 6 (six) hours as needed for wheezing or shortness of breath.   amitriptyline (ELAVIL) 10 MG tablet Take 1 tablet (10 mg total) by mouth at bedtime.   ibuprofen (ADVIL) 800 MG tablet Take 1 tablet (800 mg total) by mouth every 8 (eight) hours as needed.   norethindrone (MICRONOR) 0.35 MG tablet Take 1 tablet by mouth daily.   Vitamin D, Ergocalciferol, (DRISDOL) 1.25 MG (50000 UNIT) CAPS capsule Take 1 capsule (50,000 Units total) by mouth every 7 (seven) days.   No facility-administered medications prior to visit.   Reviewed past medical and social history.   ROS per HPI above      Objective:  BP 130/82 (BP Location: Right Arm,  Patient Position: Sitting, Cuff Size: Normal)   Pulse 83   Temp (!) 97.1 F (36.2 C) (Temporal)   Ht 5\' 7"  (1.702 m)   Wt (!) 308 lb 12.8 oz (140.1 kg)   SpO2 97%   BMI 48.36 kg/m      Physical Exam Constitutional:      Appearance: She is obese.  Cardiovascular:     Rate and Rhythm: Normal rate.     Pulses: Normal pulses.  Pulmonary:     Effort: Pulmonary effort is normal.  Neurological:     Mental Status: She is alert and oriented to person, place, and time.  Psychiatric:        Mood and Affect: Mood normal.        Behavior: Behavior normal.        Thought Content: Thought content normal.     No results found for any visits on 01/12/22.    Assessment & Plan:    Problem List Items Addressed This Visit       Other   Depression    Improved sleep quality, but persistent mood swing and  excessive daytime somnolence during and after menstrual cycle. Did not schedule appt with EAP.  Advised to schedule appt with psychology Schedule appt with GYN to discuss COC. Entered referral to psychitary to re eval diagnosis and medication. Maintain elavil dose and add trintellix F/up in 3month      Relevant Medications   vortioxetine HBr (TRINTELLIX) 5 MG TABS tablet   Other Relevant Orders   Ambulatory referral to Psychiatry   Excessive daytime sleepiness    Advised to schedule appt with neurology for sleep study.      Relevant Orders   Ambulatory referral to Sleep Studies   Obesity - Primary    We discussed use of wegovy injection, possible side effects and contraindications. She verbalized understanding. Advised about the importance of heart healthy diet and regular exercise in combination with wegovy Wt Readings from Last 3 Encounters:  01/12/22 (!) 308 lb 12.8 oz (140.1 kg)  12/16/21 (!) 304 lb 3.2 oz (138 kg)  11/09/21 (!) 306 lb (138.8 kg)   wegovy 0.25mg  sent F/up in 66month      Relevant Medications   Semaglutide-Weight Management (WEGOVY) 0.25 MG/0.5ML SOAJ   Return in about 4 weeks (around 02/09/2022) for depression and anxiety.     04/11/2022, NP

## 2022-01-12 NOTE — Patient Instructions (Signed)
Start trintellix and wegovy Schedule appt with GYN, psychology and psychiatry.

## 2022-01-25 ENCOUNTER — Encounter (HOSPITAL_COMMUNITY): Payer: Self-pay

## 2022-01-25 ENCOUNTER — Emergency Department (HOSPITAL_COMMUNITY)
Admission: EM | Admit: 2022-01-25 | Discharge: 2022-01-25 | Disposition: A | Discharge status: Home / Self Care | Payer: Managed Care, Other (non HMO) | Attending: Emergency Medicine | Admitting: Emergency Medicine

## 2022-01-25 ENCOUNTER — Emergency Department (HOSPITAL_COMMUNITY): Payer: Managed Care, Other (non HMO)

## 2022-01-25 DIAGNOSIS — I10 Essential (primary) hypertension: Secondary | ICD-10-CM | POA: Diagnosis present

## 2022-01-25 LAB — BASIC METABOLIC PANEL
Anion gap: 7 (ref 5–15)
BUN: 9 mg/dL (ref 6–20)
CO2: 25 mmol/L (ref 22–32)
Calcium: 9.3 mg/dL (ref 8.9–10.3)
Chloride: 107 mmol/L (ref 98–111)
Creatinine, Ser: 0.75 mg/dL (ref 0.44–1.00)
GFR, Estimated: >60 mL/min (ref >60)
Glucose, Bld: 84 mg/dL (ref 70–99)
Potassium: 4 mmol/L (ref 3.5–5.1)
Sodium: 139 mmol/L (ref 135–145)

## 2022-01-25 LAB — CBC
HCT: 41.9 % (ref 36.0–46.0)
Hemoglobin: 14.1 g/dL (ref 12.0–15.0)
MCH: 29.9 pg (ref 26.0–34.0)
MCHC: 33.7 g/dL (ref 30.0–36.0)
MCV: 89 fL (ref 80.0–100.0)
Platelets: 225 10*3/uL (ref 150–400)
RBC: 4.71 MIL/uL (ref 3.87–5.11)
RDW: 12.7 % (ref 11.5–15.5)
WBC: 5.8 10*3/uL (ref 4.0–10.5)
nRBC: 0 % (ref 0.0–0.2)

## 2022-01-25 LAB — TROPONIN I (HIGH SENSITIVITY)
Troponin I (High Sensitivity): <2 ng/L (ref <18)
Troponin I (High Sensitivity): <2 ng/L (ref <18)

## 2022-01-25 LAB — I-STAT BETA HCG BLOOD, ED (MC, WL, AP ONLY): I-stat hCG, quantitative: <5 m[IU]/mL (ref <5)

## 2022-01-25 NOTE — ED Provider Triage Note (Signed)
Emergency Medicine Provider Triage Evaluation Note  Carmen Wall , a 31 y.o. female  was evaluated in triage.  Pt complains of new onset hypertension over the last 24 hours, previous history of hypertension in pregnancy but no history of hypertension outside of pregnancy.  She does report that she has had some increased stress recently.  She reports that she feels very off, endorses shortness of breath, chest pain when she is having high blood pressure, reports episodes of pins-and-needles, tingling bilateral arms that last for around 5 minutes at a time, worse with bending arms or using arms.  She denies any headache but feels foggy headed/dizzy.  She denies any vision changes.  Review of Systems  Positive: Foggy head, chest pain, shob with exertion, htn Negative: Vision changes, headache  Physical Exam  BP (!) 149/113 (BP Location: Left Arm)   Pulse 80   Temp 98.8 F (37.1 C) (Oral)   Resp 18   Ht 5\' 7"  (1.702 m)   Wt (!) 139.4 kg   SpO2 98%   BMI 48.12 kg/m  Gen:   Awake, no distress   Resp:  Normal effort  MSK:   Moves extremities without difficulty  Other:    Medical Decision Making  Medically screening exam initiated at 2:48 PM.  Appropriate orders placed.  C[facility] Olig[facility] was informed that the remainder of the evaluation will be completed by another provider, this initial triage assessment does not replace that evaluation, and the importance of remaining in the ED until their evaluation is complete.  Workup initiated   [facility], Olene Floss 01/25/22 1450

## 2022-01-25 NOTE — Discharge Instructions (Addendum)
Please follow-up with your primary care physician for recheck of your blood pressure.  You are asymptomatic at this time, you have no evidence of endorgan damage on your laboratory evaluation, your chest x-ray is clear and your neurologic exam is normal.

## 2022-01-25 NOTE — ED Provider Notes (Signed)
[facility]-EMERGENCY DEPT Provider Note   CSN: [id number] Arrival date & time: 01/25/22  1340     History  Chief Complaint  Patient presents with   Hypertension    Carmen Wall is a 31 y.o. female.   Hypertension     31 year old female with medical history significant for anxiety, migraine headaches, constipation, IBS, depression who presents to the emergency department with hypertension.  The patient states that she felt "funny" with no clear specific symptoms last night and took her blood pressure and it was elevated to the 150s.  She denied any blurry vision.  She had an episode of pins-and-needles tingling in her bilateral arms.  She is not sure if she was hyperventilating at the time.  She denies any active chest pain.  She endorsed some chest tightness over the past 24 hours.  She called her PCP who advised her to present to the emergency department for evaluation of her hypertension.  She currently denies any facial droop, numbness, weakness, dysarthria, dysphagia, blurry vision, chest pain, shortness of breath, abdominal pain.  She has had intermittent episodes of lightheadedness with her hypertension.  Home Medications Prior to Admission medications   Medication Sig Start Date End Date Taking? Authorizing Provider  albuterol (VENTOLIN HFA) 108 (90 Base) MCG/ACT inhaler Inhale into the lungs every 6 (six) hours as needed for wheezing or shortness of breath.    [provider]  amitriptyline (ELAVIL) 10 MG tablet Take 1 tablet (10 mg total) by mouth at bedtime. 12/16/21   [name], Bonna Gains, NP  ibuprofen (ADVIL) 800 MG tablet Take 1 tablet (800 mg total) by mouth every 8 (eight) hours as needed. 07/24/21   Shivaji, Valerie Roys, MD  norethindrone (MICRONOR) 0.35 MG tablet Take 1 tablet by mouth daily. 10/16/21   [provider]  Semaglutide-Weight Management (WEGOVY) 0.25 MG/0.5ML SOAJ Inject 0.25 mg into the skin once a week.  01/12/22   [name], Bonna Gains, NP  Vitamin D, Ergocalciferol, (DRISDOL) 1.25 MG (50000 UNIT) CAPS capsule Take 1 capsule (50,000 Units total) by mouth every 7 (seven) days. 12/16/21   [name], Bonna Gains, NP  vortioxetine HBr (TRINTELLIX) 5 MG TABS tablet Take 1 tablet (5 mg total) by mouth daily. 01/12/22   [name], Bonna Gains, NP      Allergies    Patient has no known allergies.    Review of Systems   Review of Systems  All other systems reviewed and are negative.   Physical Exam Updated Vital Signs BP (!) 155/96   Pulse 76   Temp 98.8 F (37.1 C) (Oral)   Resp 16   Ht 5\' 7"  (1.702 m)   Wt (!) 139.4 kg   LMP 12/25/2021 (Approximate)   SpO2 99%   BMI 48.12 kg/m  Physical Exam Vitals and nursing note reviewed.  Constitutional:      General: She is not in acute distress.    Appearance: She is well-developed.  HENT:     Head: Normocephalic and atraumatic.  Eyes:     Conjunctiva/sclera: Conjunctivae normal.  Cardiovascular:     Rate and Rhythm: Normal rate and regular rhythm.  Pulmonary:     Effort: Pulmonary effort is normal. No respiratory distress.     Breath sounds: Normal breath sounds.  Abdominal:     Palpations: Abdomen is soft.     Tenderness: There is no abdominal tenderness.  Musculoskeletal:        General: No swelling.     Cervical  back: Neck supple.  Skin:    General: Skin is warm and dry.     Capillary Refill: Capillary refill takes less than 2 seconds.  Neurological:     Mental Status: She is alert.     Comments: MENTAL STATUS EXAM:    Orientation: Alert and oriented to person, place and time.  Memory: Cooperative, follows commands well.  Language: Speech is clear and language is normal.   CRANIAL NERVES:    CN 2 (Optic): Visual fields intact to confrontation.  CN 3,4,6 (EOM): Pupils equal and reactive to light. Full extraocular eye movement without nystagmus.  CN 5 (Trigeminal): Facial sensation is normal, no weakness of masticatory muscles.  CN 7  (Facial): No facial weakness or asymmetry.  CN 8 (Auditory): Auditory acuity grossly normal.  CN 9,10 (Glossophar): The uvula is midline, the palate elevates symmetrically.  CN 11 (spinal access): Normal sternocleidomastoid and trapezius strength.  CN 12 (Hypoglossal): The tongue is midline. No atrophy or fasciculations.Marland Kitchen   MOTOR:  Muscle Strength: 5/5RUE, 5/5LUE, 5/5RLE, 5/5LLE.   COORDINATION:   Intact finger-to-nose, no tremor.   SENSATION:   Intact to light touch all four extremities.  GAIT: Gait normal without ataxia   Psychiatric:        Mood and Affect: Mood normal.     ED Results / Procedures / Treatments   Labs (all labs ordered are listed, but only abnormal results are displayed) Labs Reviewed  CBC  BASIC METABOLIC PANEL  I-STAT BETA HCG BLOOD, ED ([facility], [facility], [facility] ONLY)  TROPONIN I (HIGH SENSITIVITY)  TROPONIN I (HIGH SENSITIVITY)    EKG EKG Interpretation  Date/Time:  Tuesday January 25 2022 17:31:26 EDT Ventricular Rate:  72 PR Interval:  141 QRS Duration: 94 QT Interval:  425 QTC Calculation: 466 R Axis:   53 Text Interpretation: Sinus rhythm Abnormal inferior Q waves Confirmed by Regan Lemming ([id number]) on 01/25/2022 6:20:11 PM  Radiology DG Chest 2 View  Result Date: 01/25/2022 CLINICAL DATA:  Shortness of breath, hypertension, under lots of stress recently EXAM: CHEST - 2 VIEW COMPARISON:  None FINDINGS: Normal heart size, mediastinal contours, and pulmonary vascularity. Lungs clear. No pleural effusion or pneumothorax. Bones unremarkable. IMPRESSION: Normal exam. Electronically Signed   By: Lavonia Dana M.D.   On: 01/25/2022 15:22    Procedures Procedures    Medications Ordered in ED Medications - No data to display  ED Course/ Medical Decision Making/ A&P                           Medical Decision Making  31 year old female with medical history significant for anxiety, migraine headaches, constipation, IBS, depression who presents to the emergency  department with hypertension.  The patient states that she felt "funny" with no clear specific symptoms last night and took her blood pressure and it was elevated to the 150s.  She denied any blurry vision.  She had an episode of pins-and-needles tingling in her bilateral arms.  She is not sure if she was hyperventilating at the time.  She denies any active chest pain.  She endorsed some chest tightness over the past 24 hours.  She called her PCP who advised her to present to the emergency department for evaluation of her hypertension.  She currently denies any facial droop, numbness, weakness, dysarthria, dysphagia, blurry vision, chest pain, shortness of breath, abdominal pain.  She has had intermittent episodes of lightheadedness with her hypertension.  On arrival, the patient  was afebrile, hemodynamically stable, not tachycardic or tachypneic, saturating 90% on room air, initial blood pressure 149/113.  Patient with a normal neurologic exam, currently asymptomatic at this time.  Initial EKG revealed sinus rhythm, ventricular rate 72, abnormal inferior Q waves noted.  No active chest discomfort although occasional chest tightness therefore we will rule out ACS.  Troponins x2 were collected and resulted negative.  A CBC was without a leukocytosis or anemia and a BMP was without electrolyte abnormality, normal renal function.  No evidence of endorgan damage.  The patient is not currently encephalopathic.  She denies any fevers or chills and has no meningismus.  She has a normal neurologic exam.  HCG is normal.  Chest x-ray was performed which revealed no focal airspace disease.  Patient has asymptomatic hypertension at this time.  Low concern for acute intrathoracic abnormality such as ACS, patient is PERC negative, low suspicion for PE, low concern for aortic dissection, Boerhaave syndrome, pneumothorax.  Symptoms essentially have been ruled out based on work-up at this time.  Recommended the patient follow-up  with her PCP for recheck of her blood pressure and consideration for outpatient antihypertensive management.  Overall stable for discharge.  Final Clinical Impression(s) / ED Diagnoses Final diagnoses:  Hypertension, unspecified type    Rx / DC Orders ED Discharge Orders     None         Ernie Avena, MD 01/25/22 [phone]

## 2022-01-25 NOTE — ED Triage Notes (Signed)
Pt arrived via POV, c/o hypertention x24 hrs, no hx of such. States she has been under a lot of stress recently. Denies any headache. Also endorses periods of "numbness and tingling" bilateral arms.

## 2022-01-31 ENCOUNTER — Encounter: Payer: Self-pay | Admitting: Nurse Practitioner

## 2022-01-31 ENCOUNTER — Ambulatory Visit: Payer: Managed Care, Other (non HMO) | Admitting: Nurse Practitioner

## 2022-01-31 VITALS — BP 140/70 | HR 76 | Temp 97.4°F | Ht 67.0 in | Wt 304.2 lb

## 2022-01-31 DIAGNOSIS — F332 Major depressive disorder, recurrent severe without psychotic features: Secondary | ICD-10-CM | POA: Diagnosis not present

## 2022-01-31 DIAGNOSIS — G4719 Other hypersomnia: Secondary | ICD-10-CM | POA: Diagnosis not present

## 2022-01-31 DIAGNOSIS — I10 Essential (primary) hypertension: Secondary | ICD-10-CM | POA: Diagnosis not present

## 2022-01-31 MED ORDER — AMLODIPINE BESYLATE 10 MG PO TABS: 10.0000 mg | ORAL_TABLET | Freq: Every day | ORAL | 1 refills | Status: DC

## 2022-01-31 NOTE — Assessment & Plan Note (Signed)
Stopped trintellix 4days ago. Reports lack of any emotions and elevated BP  Maintain elavil dose Schedule appt with psychiatry

## 2022-01-31 NOTE — Patient Instructions (Signed)
Continue amlodipine Maintain DASH diet Schedule appt with sleep clinic

## 2022-01-31 NOTE — Assessment & Plan Note (Signed)
New onset x 2weeks. Report hx of HTN during last trimester of pregnancy 30months ago Hx of daytime somnolence-has not scheduled appt with sleep clinic Reviewed ED notes and lab results: no acute finding New medication in last 2weeks (tritellix-med stopped 4days ago) Current use of micronor for contraception. BP Readings from Last 3 Encounters:  01/31/22 (!) 140/70  01/25/22 (!) 159/80  01/12/22 130/82   Start amlodipine 10mg  Schedule appt with sleep clinic Maintain DASH diet

## 2022-01-31 NOTE — Progress Notes (Signed)
Established Patient Visit  Patient: Carmen Wall   DOB: September 14, 1990   31 y.o. Female  MRN: [id number] Visit Date: 01/31/2022  Subjective:    Chief Complaint  Patient presents with   Office Visit    [facility]ospitla F/u from 01/25/22 BP, Says she has been checking it every 2-3 hours.   HPI Depression Stopped trintellix 4days ago. Reports lack of any emotions and elevated BP  Maintain elavil dose Schedule appt with psychiatry  Primary hypertension New onset x 2weeks. Report hx of HTN during last trimester of pregnancy 37months ago Hx of daytime somnolence-has not scheduled appt with sleep clinic Reviewed ED notes and lab results: no acute finding New medication in last 2weeks (tritellix-med stopped 4days ago) Current use of micronor for contraception. BP Readings from Last 3 Encounters:  01/31/22 (!) 140/70  01/25/22 (!) 159/80  01/12/22 130/82   Start amlodipine 10mg  Schedule appt with sleep clinic Maintain DASH diet  Reviewed medical, surgical, and social history today  Medications: Outpatient Medications Prior to Visit  Medication Sig   albuterol (VENTOLIN HFA) 108 (90 Base) MCG/ACT inhaler Inhale into the lungs every 6 (six) hours as needed for wheezing or shortness of breath.   amitriptyline (ELAVIL) 10 MG tablet Take 1 tablet (10 mg total) by mouth at bedtime.   ibuprofen (ADVIL) 800 MG tablet Take 1 tablet (800 mg total) by mouth every 8 (eight) hours as needed.   norethindrone (MICRONOR) 0.35 MG tablet Take 1 tablet by mouth daily.   Vitamin D, Ergocalciferol, (DRISDOL) 1.25 MG (50000 UNIT) CAPS capsule Take 1 capsule (50,000 Units total) by mouth every 7 (seven) days.   [DISCONTINUED] vortioxetine HBr (TRINTELLIX) 5 MG TABS tablet Take 1 tablet (5 mg total) by mouth daily.   Semaglutide-Weight Management (WEGOVY) 0.25 MG/0.5ML SOAJ Inject 0.25 mg into the skin once a week. (Patient not taking: Reported on 01/31/2022)   No  facility-administered medications prior to visit.   Reviewed past medical and social history.   ROS per HPI above  Last CBC Lab Results  Component Value Date   WBC 5.8 01/25/2022   HGB 14.1 01/25/2022   HCT 41.9 01/25/2022   MCV 89.0 01/25/2022   MCH 29.9 01/25/2022   RDW 12.7 01/25/2022   PLT 225 01/25/2022   Last metabolic panel Lab Results  Component Value Date   GLUCOSE 84 01/25/2022   NA 139 01/25/2022   K 4.0 01/25/2022   CL 107 01/25/2022   CO2 25 01/25/2022   BUN 9 01/25/2022   CREATININE 0.75 01/25/2022   GFRNONAA >60 01/25/2022   CALCIUM 9.3 01/25/2022   PROT 6.6 11/09/2021   ALBUMIN 4.1 11/09/2021   LABGLOB 2.5 04/22/2020   AGRATIO 1.6 04/22/2020   BILITOT 0.6 11/09/2021   ALKPHOS 64 11/09/2021   AST 16 11/09/2021   ALT 19 11/09/2021   ANIONGAP 7 01/25/2022   Last hemoglobin A1c Lab Results  Component Value Date   HGBA1C 5.1 11/09/2021   Last thyroid functions Lab Results  Component Value Date   TSH 1.21 11/09/2021   T3TOTAL 125 07/03/2019        Objective:  BP (!) 140/70 (BP Location: Left Arm, Patient Position: Sitting, Cuff Size: Large)   Pulse 76   Temp (!) 97.4 F (36.3 C) (Temporal)   Ht 5\' 7"  (1.702 m)   Wt (!) 304 lb 3.2 oz (138 kg)   LMP 12/25/2021 (Approximate)  SpO2 96%   Breastfeeding No   BMI 47.64 kg/m      Physical Exam Constitutional:      Appearance: She is obese.  Cardiovascular:     Rate and Rhythm: Normal rate and regular rhythm.     Pulses: Normal pulses.     Heart sounds: Normal heart sounds.  Pulmonary:     Effort: Pulmonary effort is normal.     Breath sounds: Normal breath sounds.  Musculoskeletal:     Right lower leg: No edema.     Left lower leg: No edema.  Neurological:     Mental Status: She is alert and oriented to person, place, and time.     No results found for any visits on 01/31/22.    Assessment & Plan:    Problem List Items Addressed This Visit       Cardiovascular and  Mediastinum   Primary hypertension - Primary    New onset x 2weeks. Report hx of HTN during last trimester of pregnancy 20months ago Hx of daytime somnolence-has not scheduled appt with sleep clinic Reviewed ED notes and lab results: no acute finding New medication in last 2weeks (tritellix-med stopped 4days ago) Current use of micronor for contraception. BP Readings from Last 3 Encounters:  01/31/22 (!) 140/70  01/25/22 (!) 159/80  01/12/22 130/82   Start amlodipine 10mg  Schedule appt with sleep clinic Maintain DASH diet      Relevant Medications   amLODipine (NORVASC) 10 MG tablet     Other   Depression    Stopped trintellix 4days ago. Reports lack of any emotions and elevated BP  Maintain elavil dose Schedule appt with psychiatry      Excessive daytime sleepiness   Return in about 4 weeks (around 02/28/2022) for HTN.     03/02/2022, NP

## 2022-02-09 ENCOUNTER — Ambulatory Visit: Payer: Managed Care, Other (non HMO) | Admitting: Nurse Practitioner

## 2022-04-10 ENCOUNTER — Encounter (HOSPITAL_COMMUNITY): Payer: Self-pay | Admitting: *Deleted

## 2022-04-10 ENCOUNTER — Emergency Department (HOSPITAL_COMMUNITY): Payer: Managed Care, Other (non HMO)

## 2022-04-10 ENCOUNTER — Other Ambulatory Visit: Payer: Self-pay

## 2022-04-10 ENCOUNTER — Emergency Department (HOSPITAL_COMMUNITY)
Admission: EM | Admit: 2022-04-10 | Discharge: 2022-04-10 | Disposition: A | Discharge status: Home / Self Care | Payer: Managed Care, Other (non HMO) | Attending: Emergency Medicine | Admitting: Emergency Medicine

## 2022-04-10 DIAGNOSIS — Z3A01 Less than 8 weeks gestation of pregnancy: Secondary | ICD-10-CM | POA: Insufficient documentation

## 2022-04-10 DIAGNOSIS — O209 Hemorrhage in early pregnancy, unspecified: Secondary | ICD-10-CM | POA: Insufficient documentation

## 2022-04-10 DIAGNOSIS — N939 Abnormal uterine and vaginal bleeding, unspecified: Secondary | ICD-10-CM

## 2022-04-10 LAB — CBC
HCT: 40.4 % (ref 36.0–46.0)
Hemoglobin: 13.7 g/dL (ref 12.0–15.0)
MCH: 30.2 pg (ref 26.0–34.0)
MCHC: 33.9 g/dL (ref 30.0–36.0)
MCV: 89.2 fL (ref 80.0–100.0)
Platelets: 227 10*3/uL (ref 150–400)
RBC: 4.53 MIL/uL (ref 3.87–5.11)
RDW: 12.6 % (ref 11.5–15.5)
WBC: 6 10*3/uL (ref 4.0–10.5)
nRBC: 0 % (ref 0.0–0.2)

## 2022-04-10 LAB — POC URINE PREG, ED: Preg Test, Ur: NEGATIVE

## 2022-04-10 LAB — I-STAT BETA HCG BLOOD, ED (MC, WL, AP ONLY): I-stat hCG, quantitative: 9.7 m[IU]/mL — ABNORMAL HIGH (ref <5)

## 2022-04-10 NOTE — ED Notes (Signed)
Patient transported to Ultrasound 

## 2022-04-10 NOTE — ED Triage Notes (Signed)
Pt thinks she may be having a miscarriage. LMP in September. Pt states she had not menstrual in October. Pt states started bleeding and having large golf ball size clots since yesterday. Pt reports she was having lower abdominal pain, but not like menstrual cramps. She does report back pain

## 2022-04-10 NOTE — Discharge Instructions (Signed)
Your ultrasound did not show a pregnancy today.  Your hormone levels were also very low.  It is possible that you had a miscarriage of an early pregnancy, it is also possible that this is related to menstrual period.  Regardless, please follow-up with your OB/GYN sometime this week.  Return with any worsening symptoms.  Your work note is attached!

## 2022-04-10 NOTE — ED Provider Triage Note (Signed)
Emergency Medicine Provider Triage Evaluation Note  Carmen Wall , a 31 y.o. female  was evaluated in triage.  Pt complains of vaginal bleeding onset yesterday with clots. G2P2, LMP 03/01/22 with "inconclusive" home test.  Review of Systems  Positive: As above Negative: As above  Physical Exam  There were no vitals taken for this visit. Gen:   Awake, no distress   Resp:  Normal effort  MSK:   Moves extremities without difficulty  Other:    Medical Decision Making  Medically screening exam initiated at 6:51 AM.  Appropriate orders placed.  [facility] was informed that the remainder of the evaluation will be completed by another provider, this initial triage assessment does not replace that evaluation, and the importance of remaining in the ED until their evaluation is complete.     Tacy Learn, PA-C 04/10/22 [phone]

## 2022-04-10 NOTE — ED Provider Notes (Signed)
[facility] EMERGENCY DEPARTMENT Provider Note   CSN: [id number] Arrival date & time: 04/10/22  0631     History No chief complaint on file.   Carmen Wall is a 31 y.o. female presenting today with 2 days worth of vaginal bleeding.  She says that she is having mild discomfort but no cramping.  Yesterday she started to note large clots and says that she has passed over 5-clots and is still bleeding.  No palpitations, shortness of breath, dizziness or lightheadedness.  She is G2P2.  Last menstrual period in September and she believes that she is around 3 to [redacted] weeks pregnant.  Says that her test at home was "inconclusive."  HPI     Home Medications Prior to Admission medications   Medication Sig Start Date End Date Taking? Authorizing Provider  albuterol (VENTOLIN HFA) 108 (90 Base) MCG/ACT inhaler Inhale into the lungs every 6 (six) hours as needed for wheezing or shortness of breath.    [provider]  amitriptyline (ELAVIL) 10 MG tablet Take 1 tablet (10 mg total) by mouth at bedtime. 12/16/21   [name], Bonna Gains, NP  amLODipine (NORVASC) 10 MG tablet Take 1 tablet (10 mg total) by mouth daily. 01/31/22   [name], Bonna Gains, NP  ibuprofen (ADVIL) 800 MG tablet Take 1 tablet (800 mg total) by mouth every 8 (eight) hours as needed. 07/24/21   Shivaji, Valerie Roys, MD  norethindrone (MICRONOR) 0.35 MG tablet Take 1 tablet by mouth daily. 10/16/21   [provider]  Semaglutide-Weight Management (WEGOVY) 0.25 MG/0.5ML SOAJ Inject 0.25 mg into the skin once a week. Patient not taking: Reported on 01/31/2022 01/12/22   [name], Bonna Gains, NP  Vitamin D, Ergocalciferol, (DRISDOL) 1.25 MG (50000 UNIT) CAPS capsule Take 1 capsule (50,000 Units total) by mouth every 7 (seven) days. 12/16/21   [name], Bonna Gains, NP      Allergies    Patient has no known allergies.    Review of Systems   Review of Systems  Physical Exam Updated Vital  Signs BP (!) 99/58   Pulse 84   Temp 98.9 F (37.2 C)   Resp 15   LMP 03/01/2022 (Approximate)   SpO2 99%  Physical Exam Constitutional:      Appearance: She is obese.  Abdominal:     General: Abdomen is flat.     Palpations: Abdomen is soft.     Tenderness: There is no abdominal tenderness.  Genitourinary:    General: Normal vulva.     Comments: Pelvic exam performed in presence of nurse tech.  Patient had no external lesions or abnormalities.  No ulcerations.  Internal exam revealing of normal cervix.  Dark blood in the vaginal vault.  Mild right-sided tenderness on bimanual however no palpated masses.  No CMT Skin:    General: Skin is warm and dry.  Neurological:     Mental Status: She is alert.     ED Results / Procedures / Treatments   Labs (all labs ordered are listed, but only abnormal results are displayed) Labs Reviewed  I-STAT BETA HCG BLOOD, ED ([facility], [facility], [facility] ONLY) - Abnormal; Notable for the following components:      Result Value   I-stat hCG, quantitative 9.7 (*)    All other components within normal limits  CBC  POC URINE PREG, ED    EKG None  Radiology US OB Comp < 14 Wks  Result Date: 04/10/2022 CLINICAL DATA:  Vaginal bleeding EXAM: OBSTETRIC <  14 WK [place] AND TRANSVAGINAL OB US TECHNIQUE: Both transabdominal and transvaginal ultrasound examinations were performed for complete evaluation of the gestation as well as the maternal uterus, adnexal regions, and pelvic cul-de-sac. Transvaginal technique was performed to assess early pregnancy. COMPARISON:  None Available. FINDINGS: Intrauterine gestational sac: Not seen Yolk sac:  Not seen Embryo:  Not seen Cardiac Activity: Not seen Subchorionic hemorrhage:  None visualized. Maternal uterus/adnexae: Unremarkable. IMPRESSION: There is no demonstrable intrauterine gestational sac. Differential diagnostic possibilities would include very early IUP or complete abortion or ectopic gestation. There are no adnexal masses.  There is no free fluid in pelvis. Serial HCG estimations and follow-up sonogram as warranted may be considered. Electronically Signed   By: Ernie Avena M.D.   On: 04/10/2022 17:51    Procedures Procedures   Medications Ordered in ED Medications - No data to display  ED Course/ Medical Decision Making/ A&P Clinical Course as of 04/10/22 1556  Sun Apr 10, 2022  0659 Here with Vaginal bleeding. If U preg positive, please call MAU [GL]  0727 Pregnancy is negative. She does not need to be transferred to MAU. She can wait to be seen in waiting room [GL]    Clinical Course User Index [GL] Loeffler, Finis Bud, PA-C                           Medical Decision Making Amount and/or Complexity of Data Reviewed Labs: ordered. Radiology: ordered.   31 year old female presenting with vaginal bleeding.  Differential includes but is not limited to normal menstrual cycle, miscarriage, ovarian cyst, ectopic pregnancy, endometriosis   This is not an exhaustive differential.    Past Medical History / Co-morbidities / Social History: G2P2   Additional history: Per chart review patient follows closely with her OB/GYN, last had a normal delivery 7 months ago   Physical Exam: Pertinent physical exam findings include Vag bleeding, no other abnormalities  Lab Tests: I ordered, and personally interpreted labs.  The pertinent results include: Stable Hgb   Imaging Studies: I ordered and independently visualized and interpreted pelvic ultrasound and I agree with the radiologist that there are no signs of IUP.  Question early pregnancy that would not show on ultrasound anyway versus miscarriage   Cardiac Monitoring:  The patient was maintained on a cardiac monitor.  I viewed and interpreted the cardiac monitored which showed an underlying rhythm of: Sinus   MDM/Disposition: This is a 31 year old female who presented with vaginal bleeding.  Reports passing large clots for 2 days.  Believes she  may be pregnant.  Work-up today revealing of a very low hCG level.  Pelvic ultrasound without signs of IUP.  Pelvic exam with a closed cervix but dark red blood in the vaginal vault.  No masses palpated on bimanual.  She is hemodynamically stable.  Hemoglobin is also stable.  At this time I believe she is stable for discharge home with OB/GYN follow-up.  Does not appear to have an emergent condition such as torsion, ruptured cyst or ectopic requiring intervention today.  She is agreeable to outpatient follow-up  Final Clinical Impression(s) / ED Diagnoses Final diagnoses:  Vaginal bleeding    Rx / DC Orders ED Discharge Orders     None      Results and diagnoses were explained to the patient. Return precautions discussed in full. Patient had no additional questions and expressed complete understanding.   This chart was dictated using voice recognition software.  Despite best efforts to proofread,  errors can occur which can change the documentation meaning.    Rhae Hammock, PA-C 04/10/22 1930    Sherwood Gambler, MD 04/14/22 [phone]

## 2022-04-11 ENCOUNTER — Telehealth: Payer: Self-pay

## 2022-04-11 NOTE — Telephone Encounter (Signed)
Transition Care Management Follow-up Telephone Call Date of discharge and from where: 04/10/22 [facility] ED How have you been since you were released from the hospital? I'm doing much better Any questions or concerns? No  Items Reviewed: Did the pt receive and understand the discharge instructions provided? Yes  Medications obtained and verified? No  Other? No  Any new allergies since your discharge? No  Dietary orders reviewed? No Do you have support at home? Yes   Home Care and Equipment/Supplies: Were home health services ordered? not applicable If so, what is the name of the agency? N/a  Has the agency set up a time to come to the patient's home? not applicable Were any new equipment or medical supplies ordered?  No What is the name of the medical supply agency? N/a Were you able to get the supplies/equipment? not applicable Do you have any questions related to the use of the equipment or supplies? No  Functional Questionnaire: (I = Independent and D = Dependent) ADLs: I  Bathing/Dressing- I  Meal Prep- I  Eating- I  Maintaining continence- I  Transferring/Ambulation- I  Managing Meds- I  Follow up appointments reviewed:  PCP Hospital f/u appt confirmed? No  Scheduled to see n/a on n/a @ n/a. Pt instructed to f/u with OB/GYN, who instructed pt to track menstrual cycle starting next month. Pt does not feel the need to schedule an appt at this time. [facility] f/u appt confirmed? No  Scheduled to see n/a on n/a @ n/a. Are transportation arrangements needed?  N/a If their condition worsens, is the pt aware to call PCP or go to the Emergency Dept.? Yes Was the patient provided with contact information for the PCP's office or ED? Yes Was to pt encouraged to call back with questions or concerns? Yes

## 2022-06-06 NOTE — L&D Delivery Note (Signed)
Operative Delivery Note At 3:49 PM a viable and healthy female was delivered via Vaginal, Vacuum Investment banker, operational).  Presentation: vertex; Position: Occiput,, Anterior; Station: 0  Verbal consent: obtained from patient.  Risks and benefits discussed in detail.  Risks include, but are not limited to the risks of anesthesia, bleeding, infection, damage to maternal tissues, fetal cephalhematoma.  There is also the risk of inability to effect vaginal delivery of the head, or shoulder dystocia that cannot be resolved by established maneuvers, leading to the need for emergency cesarean section.  The patient rapidly progressed in labor from 2 cm to 5 cm.  Her water spontaneously broke and she then rapidly progressed to 9 cm dilated.  During this time the fetal tracing was showing intermittent variable decelerations.  The patient's progression of labor stalled out at 9 cm for several hours and Pitocin augmentation was initiated.  The fetus was felt to be in the OP presentation.  Multiple maneuvers were performed to encourage fetal rotation.  Fetal variables became more severe and long-lasting with slow return to baseline.  2 attempts were made to manually rotate the fetal head.  The cervix was then manually reduced.  Variables became more prolonged and the decision was made to proceed with vacuum extraction due to the heart rate remaining in the 60s.  The Kiwi vacuum was applied and the suction was engaged into the green.  With 1 contraction the fetal vertex was delivered to crowning with 1 pop-off.  With removal of the vacuum the fetal head rotated to OA and the anterior and posterior shoulders delivered easily.  The infant was passed to the waiting maternal abdomen and immediately cried vigorously.  Following a 1 minute delay, the cord was clamped and cut.  The placenta delivered spontaneously, intact, with three-vessel cord.  Approximately 10% abruption was noted at the edge of the placenta.  No lacerations required repair.   All sponge, needle, instrument counts were correct.  Mom and baby are doing well following delivery.  APGAR: 8, 8; weight 7 lb 5.1 oz (3320 g).   Placenta status: spontaneous, intact.   Cord:  3V  Anesthesia:  Epidural Instruments: Kiwi Episiotomy: None Lacerations: None Suture Repair:  NA Est. Blood Loss (mL):  107 Female infant: Circumcision desired  Mom to postpartum.  Baby to Couplet care / Skin to Skin.  Waynard Reeds 12/29/2022, 4:07 PM

## 2022-06-07 ENCOUNTER — Ambulatory Visit (INDEPENDENT_AMBULATORY_CARE_PROVIDER_SITE_OTHER): Payer: Managed Care, Other (non HMO) | Admitting: Family Medicine

## 2022-06-07 ENCOUNTER — Other Ambulatory Visit: Payer: Self-pay

## 2022-06-07 DIAGNOSIS — Z3A08 8 weeks gestation of pregnancy: Secondary | ICD-10-CM

## 2022-06-07 DIAGNOSIS — N912 Amenorrhea, unspecified: Secondary | ICD-10-CM

## 2022-06-07 LAB — POCT PREGNANCY, URINE: Preg Test, Ur: POSITIVE — AB

## 2022-06-07 NOTE — Progress Notes (Signed)
Possible Pregnancy  Here today for pregnancy confirmation. UPT in office today is positive. Pt reports first positive home UPT on 05/12/2022. Reviewed dating with patient:   LMP: 04/09/2022 EDD: 01/14/2023 8w 3d today  OB history reviewed. SVDx3. Hx of gestational hypertension and cholestasis with 2nd pregnancy. Reviewed medications and allergies with patient; list of medications safe to take during pregnancy given.  Recommended pt begin prenatal vitamin and schedule prenatal care.  Martina Sinner, RN 06/07/2022  1:47 PM

## 2022-06-17 ENCOUNTER — Encounter: Payer: Self-pay | Admitting: *Deleted

## 2022-06-22 LAB — CYTOLOGY - PAP: Pap: NEGATIVE

## 2022-06-29 ENCOUNTER — Telehealth: Payer: Self-pay | Admitting: Family Medicine

## 2022-06-29 ENCOUNTER — Telehealth: Payer: Managed Care, Other (non HMO)

## 2022-06-29 NOTE — Telephone Encounter (Signed)
Called patient to cancel her intake due to low staffing. There was no answer to the phone call so a voicemail was left with the call back number for the office.

## 2022-07-04 ENCOUNTER — Encounter: Payer: Self-pay | Admitting: Advanced Practice Midwife

## 2022-07-04 ENCOUNTER — Other Ambulatory Visit (HOSPITAL_COMMUNITY)
Admission: RE | Admit: 2022-07-04 | Discharge: 2022-07-04 | Disposition: A | Discharge status: Home / Self Care | Payer: Managed Care, Other (non HMO) | Source: Ambulatory Visit | Attending: Advanced Practice Midwife | Admitting: Advanced Practice Midwife

## 2022-07-04 ENCOUNTER — Ambulatory Visit (INDEPENDENT_AMBULATORY_CARE_PROVIDER_SITE_OTHER): Payer: Managed Care, Other (non HMO) | Admitting: Advanced Practice Midwife

## 2022-07-04 ENCOUNTER — Other Ambulatory Visit: Payer: Self-pay

## 2022-07-04 VITALS — Wt 304.9 lb

## 2022-07-04 DIAGNOSIS — Z3490 Encounter for supervision of normal pregnancy, unspecified, unspecified trimester: Secondary | ICD-10-CM | POA: Insufficient documentation

## 2022-07-04 DIAGNOSIS — G2581 Restless legs syndrome: Secondary | ICD-10-CM

## 2022-07-04 DIAGNOSIS — O99210 Obesity complicating pregnancy, unspecified trimester: Secondary | ICD-10-CM | POA: Insufficient documentation

## 2022-07-04 DIAGNOSIS — G4700 Insomnia, unspecified: Secondary | ICD-10-CM

## 2022-07-04 DIAGNOSIS — O0990 Supervision of high risk pregnancy, unspecified, unspecified trimester: Secondary | ICD-10-CM | POA: Insufficient documentation

## 2022-07-04 DIAGNOSIS — Z3491 Encounter for supervision of normal pregnancy, unspecified, first trimester: Secondary | ICD-10-CM

## 2022-07-04 DIAGNOSIS — Z3A12 12 weeks gestation of pregnancy: Secondary | ICD-10-CM

## 2022-07-04 DIAGNOSIS — Z8719 Personal history of other diseases of the digestive system: Secondary | ICD-10-CM

## 2022-07-04 DIAGNOSIS — Z31430 Encounter of female for testing for genetic disease carrier status for procreative management: Secondary | ICD-10-CM

## 2022-07-04 DIAGNOSIS — Z3481 Encounter for supervision of other normal pregnancy, first trimester: Secondary | ICD-10-CM

## 2022-07-04 DIAGNOSIS — G44229 Chronic tension-type headache, not intractable: Secondary | ICD-10-CM | POA: Insufficient documentation

## 2022-07-04 DIAGNOSIS — O99211 Obesity complicating pregnancy, first trimester: Secondary | ICD-10-CM

## 2022-07-04 DIAGNOSIS — Z8759 Personal history of other complications of pregnancy, childbirth and the puerperium: Secondary | ICD-10-CM

## 2022-07-04 HISTORY — DX: Restless legs syndrome: G25.81

## 2022-07-04 HISTORY — DX: Chronic tension-type headache, not intractable: G44.229

## 2022-07-04 HISTORY — DX: Obesity complicating pregnancy, unspecified trimester: O99.210

## 2022-07-04 HISTORY — DX: Insomnia, unspecified: G47.00

## 2022-07-04 LAB — POCT URINALYSIS DIP (DEVICE)
Bilirubin Urine: NEGATIVE
Glucose, UA: NEGATIVE mg/dL
Hgb urine dipstick: NEGATIVE
Ketones, ur: NEGATIVE mg/dL
Leukocytes,Ua: NEGATIVE
Nitrite: NEGATIVE
Protein, ur: NEGATIVE mg/dL
Specific Gravity, Urine: 1.025 (ref 1.005–1.030)
Urobilinogen, UA: 0.2 mg/dL (ref 0.0–1.0)
pH: 7 (ref 5.0–8.0)

## 2022-07-04 MED ORDER — CYCLOBENZAPRINE HCL 5 MG PO TABS: 5.0000 mg | ORAL_TABLET | Freq: Three times a day (TID) | ORAL | 1 refills | Status: DC | PRN

## 2022-07-04 MED ORDER — ASPIRIN 81 MG PO TBEC: 81.0000 mg | DELAYED_RELEASE_TABLET | Freq: Every day | ORAL | 5 refills | Status: DC

## 2022-07-04 NOTE — Progress Notes (Signed)
Subjective:   Carmen Wall is a 32 y.o. G3P2002 at 102w2d by LMP being seen today for her first obstetrical visit.  Her obstetrical history is significant for  cholestasis and GHTN  and has Rhinitis, allergic; Migraine without aura; Arthritis; Allergy; Depression; Chronic right-sided low back pain with right-sided sciatica; Acute pain of right knee; Obesity; GAD (generalized anxiety disorder); Jumper's knee of right side; Acute right ankle pain; Excessive daytime sleepiness; Cough variant asthma; Piriformis syndrome of right side; Insulin resistance; Vitamin D deficiency; Other hyperlipidemia; Backache; Neck pain; Radial styloid tenosynovitis; History of gestational hypertension; Myalgia; Iron deficiency anemia due to chronic blood loss; Primary hypertension; and Supervision of low-risk pregnancy, unspecified trimester on their problem list.. Patient does intend to breast feed. Pregnancy history fully reviewed.  Patient reports  recent stress with the death of her father, trouble sleeping, tension headaches, worsening restless legs  .  HISTORY: OB History  Gravida Para Term Preterm AB Living  3 2 2  0 0 2  SAB IAB Ectopic Multiple Live Births  0 0 0 0 2    # Outcome Date GA Lbr Len/2nd Weight Sex Delivery Anes PTL Lv  3 Current           2 Term 07/22/21 [redacted]w[redacted]d  6 lb 5.8 oz (2.885 kg) F Vag-Spont EPI  LIV     Name: Carmen Wall     Apgar1: 7  Apgar5: 9  1 Term 09/26/11 [redacted]w[redacted]d  7 lb 8 oz (3.402 kg)  Vag-Spont   LIV   Past Medical History:  Diagnosis Date   ADHD    Allergy    Anxiety    Anxiety    Arthritis    Back pain    Bilateral swelling of feet    Chronic fatigue syndrome    Constipation    Depression    IBS (irritable bowel syndrome)    Joint pain    Lactose intolerance    Migraine without aura    Past Surgical History:  Procedure Laterality Date   INTRAUTERINE DEVICE (IUD) INSERTION  04/2017   Mirena    KNEE ARTHROSCOPY Right     WISDOM TOOTH EXTRACTION     Family History  Problem Relation Age of Onset   Diabetes Father    Arthritis Father    Depression Father    Mental illness Father        PTSD, anxiety   Hypertension Father    Hyperlipidemia Father    Anxiety disorder Father    Bipolar disorder Father    Sleep apnea Father    Alcoholism Father    Obesity Father    Arthritis Mother        osteoartthritis   Fibroids Mother    Ovarian cysts Mother    Obesity Mother    Kidney disease Maternal Grandmother    Arthritis Maternal Grandmother    Prostate cancer Maternal Grandfather    Arthritis Maternal Grandfather    Arthritis Paternal Grandmother    Diabetes Paternal Grandfather    Mental illness Paternal Grandfather    Arthritis Paternal Grandfather    Social History   Tobacco Use   Smoking status: Never   Smokeless tobacco: Never  Vaping Use   Vaping Use: Never used  Substance Use Topics   Alcohol use: No    Alcohol/week: 0.0 standard drinks of alcohol   Drug use: No   No Known Allergies Current Outpatient Medications on File Prior to Visit  Medication Sig Dispense Refill  albuterol (VENTOLIN HFA) 108 (90 Base) MCG/ACT inhaler Inhale into the lungs every 6 (six) hours as needed for wheezing or shortness of breath.     amitriptyline (ELAVIL) 10 MG tablet Take 1 tablet (10 mg total) by mouth at bedtime. 30 tablet 5   Prenatal Vit-Fe Fumarate-FA (PREPLUS) 27-1 MG TABS Take 1 tablet by mouth daily.     No current facility-administered medications on file prior to visit.     Indications for ASA therapy (per uptodate) One of the following: Previous pregnancy with preeclampsia, especially early onset and with an adverse outcome No Multifetal gestation No Chronic hypertension No Type 1 or 2 diabetes mellitus No Chronic kidney disease No Autoimmune disease (antiphospholipid syndrome, systemic lupus erythematosus) No   Two or more of the following: Nulliparity No Obesity (body mass index  >30 kg/m2) Yes Family history of preeclampsia in mother or sister No Age ?35 years No Sociodemographic characteristics (African American race, low socioeconomic level) No Personal risk factors (eg, previous pregnancy with low birth weight or small for gestational age infant, previous adverse pregnancy outcome [eg, stillbirth], interval >10 years between pregnancies) No   Indications for early 1 hour GTT (per uptodate)  BMI >25 (>23 in Asian women) AND one of the following  Gestational diabetes mellitus in a previous pregnancy No Glycated hemoglobin ?5.7 percent (39 mmol/mol), impaired glucose tolerance, or impaired fasting glucose on previous testing No First-degree relative with diabetes No High-risk race/ethnicity (eg, African American, Latino, Native American, [place] American, Pacific Islander) No History of cardiovascular disease No Hypertension or on therapy for hypertension Yes High-density lipoprotein cholesterol level <35 mg/dL (0.90 mmol/L) and/or a triglyceride level >250 mg/dL (2.82 mmol/L) No Polycystic ovary syndrome No Physical inactivity No Other clinical condition associated with insulin resistance (eg, severe obesity, acanthosis nigricans) No Previous birth of an infant weighing ?4000 g No Previous stillbirth of unknown cause No Exam   Vitals:   07/04/22 1400  Weight: (!) 304 lb 14.4 oz (138.3 kg)   Fetal Heart Rate (bpm): 152  Uterus:     Pelvic Exam: Perineum: no hemorrhoids, normal perineum   Vulva: normal external genitalia, no lesions   Vagina:  normal mucosa, normal discharge   Cervix: no lesions and normal, pap smear done.    Adnexa: normal adnexa and no mass, fullness, tenderness   Bony Pelvis: average  System: General: well-developed, well-nourished female in no acute distress   Breast:  normal appearance, no masses or tenderness   Skin: normal coloration and turgor, no rashes   Neurologic: oriented, normal, negative, normal mood   Extremities: normal  strength, tone, and muscle mass, ROM of all joints is normal   HEENT PERRLA, extraocular movement intact and sclera clear, anicteric   Mouth/Teeth mucous membranes moist, pharynx normal without lesions and dental hygiene good   Neck supple and no masses   Cardiovascular: regular rate and rhythm   Respiratory:  no respiratory distress, normal breath sounds   Abdomen: soft, non-tender; bowel sounds normal; no masses,  no organomegaly     Assessment:   Pregnancy: [id number] Patient Active Problem List   Diagnosis Date Noted   Supervision of low-risk pregnancy, unspecified trimester 07/04/2022   Primary hypertension 01/31/2022   Myalgia 11/10/2021   Iron deficiency anemia due to chronic blood loss 11/10/2021   History of gestational hypertension 07/22/2021   Backache 09/09/2020   Neck pain 09/09/2020   Radial styloid tenosynovitis 09/09/2020   Other hyperlipidemia 12/12/2019   Insulin resistance 11/25/2019   Vitamin  D deficiency 11/25/2019   Piriformis syndrome of right side 11/11/2019   Cough variant asthma 09/26/2019   Excessive daytime sleepiness 03/13/2019   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   Acute right ankle pain 10/24/2018   Obesity 08/24/2018   GAD (generalized anxiety disorder) 08/24/2018   Acute pain of right knee 07/23/2018   Chronic right-sided low back pain with right-sided sciatica 05/15/2018   Depression 09/05/2017   Migraine without aura    Arthritis    Allergy    Rhinitis, allergic 09/09/2014     Plan:   1. [redacted] weeks gestation of pregnancy   2. History of gestational hypertension --BASA  3. History of cholestasis during pregnancy --Severe itching for weeks, bile acids normal initially, then elevated.   4. Encounter for supervision of low-risk pregnancy in first trimester  - [place] MFM OB DETAIL +14 WK; Future  5. Supervision of low-risk pregnancy, unspecified trimester --Anticipatory guidance about next visits/weeks of pregnancy given.  -  CBC/D/Plt+RPR+Rh+ABO+RubIgG... - Culture, OB Urine - GC/Chlamydia probe amp ([place])not at [facility] - Hemoglobin A1c  6. Restless legs syndrome (RLS) --Pt with RLS prior to pregnancy --Discussed safety of ibuprofen in pregnancy --Rx for Flexeril, recommend magnesium supplement  7. Chronic tension-type headache, not intractable --Pt with stress related to her father's recent passing. Discussed stress reduction. --Rx for Flexeril  8. Insomnia, unspecified type --Magnesium supplement and/or Flexeril likely to help, discussed stress reduction, sleep hygiene.  9. Obesity affecting pregnancy, antepartum, unspecified obesity type --BASA, antenatal testing at appropriate [place]    Initial labs drawn. Continue prenatal vitamins. Discussed and offered genetic screening options, including Quad screen/AFP, NIPS testing, and option to decline testing. Benefits/risks/alternatives reviewed. Pt aware that anatomy US is form of genetic screening with lower accuracy in detecting trisomies than blood work.  Pt chooses genetic screening today. NIPS: ordered. Ultrasound discussed; fetal anatomic survey: ordered. Problem list reviewed and updated. The nature of [place] with multiple MDs and other Advanced Practice Providers was explained to patient; also emphasized that residents, students are part of our team. Routine obstetric precautions reviewed. Return in about 4 weeks (around 08/01/2022) for Any provider, LOB.   Fatima Blank, CNM 07/04/22 3:14 PM

## 2022-07-04 NOTE — Addendum Note (Signed)
Addended by: Michel Harrow on: 07/04/2022 04:58 PM   Modules accepted: Orders

## 2022-07-05 LAB — CBC/D/PLT+RPR+RH+ABO+RUBIGG...
Antibody Screen: NEGATIVE
Basophils Absolute: 0 10*3/uL (ref 0.0–0.2)
Basos: 0 %
EOS (ABSOLUTE): 0.1 10*3/uL (ref 0.0–0.4)
Eos: 1 %
HCV Ab: NONREACTIVE
HIV Screen 4th Generation wRfx: NONREACTIVE
Hematocrit: 39.9 % (ref 34.0–46.6)
Hemoglobin: 13.6 g/dL (ref 11.1–15.9)
Hepatitis B Surface Ag: NEGATIVE
Immature Grans (Abs): 0 10*3/uL (ref 0.0–0.1)
Immature Granulocytes: 0 %
Lymphocytes Absolute: 1.5 10*3/uL (ref 0.7–3.1)
Lymphs: 21 %
MCH: 30.4 pg (ref 26.6–33.0)
MCHC: 34.1 g/dL (ref 31.5–35.7)
MCV: 89 fL (ref 79–97)
Monocytes Absolute: 0.5 10*3/uL (ref 0.1–0.9)
Monocytes: 6 %
Neutrophils Absolute: 5.2 10*3/uL (ref 1.4–7.0)
Neutrophils: 72 %
Platelets: 223 10*3/uL (ref 150–450)
RBC: 4.47 x10E6/uL (ref 3.77–5.28)
RDW: 12.9 % (ref 11.7–15.4)
RPR Ser Ql: NONREACTIVE
Rh Factor: POSITIVE
Rubella Antibodies, IGG: 5.84 index (ref >0.99)
WBC: 7.3 10*3/uL (ref 3.4–10.8)

## 2022-07-05 LAB — HCV INTERPRETATION

## 2022-07-05 LAB — HEMOGLOBIN A1C
Est. average glucose Bld gHb Est-mCnc: 100 mg/dL
Hgb A1c MFr Bld: 5.1 % (ref 4.8–5.6)

## 2022-07-06 LAB — URINE CULTURE, OB REFLEX

## 2022-07-06 LAB — GC/CHLAMYDIA PROBE AMP (~~LOC~~) NOT AT ARMC
Chlamydia: NEGATIVE
Comment: NEGATIVE
Comment: NORMAL
Neisseria Gonorrhea: NEGATIVE

## 2022-07-06 LAB — CULTURE, OB URINE

## 2022-07-10 LAB — PANORAMA PRENATAL TEST FULL PANEL:PANORAMA TEST PLUS 5 ADDITIONAL MICRODELETIONS: FETAL FRACTION: 4.9

## 2022-07-11 LAB — HORIZON CUSTOM: REPORT SUMMARY: NEGATIVE

## 2022-08-01 ENCOUNTER — Ambulatory Visit: Payer: Managed Care, Other (non HMO) | Admitting: Advanced Practice Midwife

## 2022-08-01 ENCOUNTER — Other Ambulatory Visit: Payer: Self-pay

## 2022-08-01 VITALS — BP 130/77 | HR 104 | Wt 318.0 lb

## 2022-08-01 DIAGNOSIS — M549 Dorsalgia, unspecified: Secondary | ICD-10-CM

## 2022-08-01 DIAGNOSIS — M2550 Pain in unspecified joint: Secondary | ICD-10-CM

## 2022-08-01 DIAGNOSIS — O99212 Obesity complicating pregnancy, second trimester: Secondary | ICD-10-CM

## 2022-08-01 DIAGNOSIS — O0992 Supervision of high risk pregnancy, unspecified, second trimester: Secondary | ICD-10-CM

## 2022-08-01 DIAGNOSIS — G4486 Cervicogenic headache: Secondary | ICD-10-CM

## 2022-08-01 DIAGNOSIS — F4329 Adjustment disorder with other symptoms: Secondary | ICD-10-CM

## 2022-08-01 DIAGNOSIS — G2581 Restless legs syndrome: Secondary | ICD-10-CM

## 2022-08-01 DIAGNOSIS — O99891 Other specified diseases and conditions complicating pregnancy: Secondary | ICD-10-CM

## 2022-08-01 DIAGNOSIS — Z3A16 16 weeks gestation of pregnancy: Secondary | ICD-10-CM

## 2022-08-01 NOTE — Progress Notes (Signed)
   PRENATAL VISIT NOTE  Subjective:  Carmen Wall is a 32 y.o. G3P2002 at 23w2dbeing seen today for ongoing prenatal care.  She is currently monitored for the following issues for this high-risk pregnancy and has Rhinitis, allergic; Migraine without aura; Arthritis; Allergy; Depression; Chronic right-sided low back pain with right-sided sciatica; Acute pain of right knee; Obesity; GAD (generalized anxiety disorder); Jumper's knee of right side; Acute right ankle pain; Excessive daytime sleepiness; Cough variant asthma; Piriformis syndrome of right side; Insulin resistance; Vitamin D deficiency; Other hyperlipidemia; Backache; Neck pain; Radial styloid tenosynovitis; History of gestational hypertension; Myalgia; Iron deficiency anemia due to chronic blood loss; Primary hypertension; Supervision of high-risk pregnancy; History of cholestasis during pregnancy; Restless legs syndrome (RLS); Chronic tension-type headache, not intractable; Insomnia; and Obesity affecting pregnancy, antepartum on their problem list.  Patient reports  joint pain in multiple joints, pain/pressure in elbows leads down to tingling numbness pain in hands .  Contractions: Not present. Vag. Bleeding: None.  Movement: Absent. Denies leaking of fluid.   The following portions of the patient's history were reviewed and updated as appropriate: allergies, current medications, past family history, past medical history, past social history, past surgical history and problem list.   Objective:   Vitals:   08/01/22 1428  BP: 130/77  Pulse: (!) 104  Weight: (!) 318 lb (144.2 kg)    Fetal Status: Fetal Heart Rate (bpm): 142   Movement: Absent     General:  Alert, oriented and cooperative. Patient is in no acute distress.  Skin: Skin is warm and dry. No rash noted.   Cardiovascular: Normal heart rate noted  Respiratory: Normal respiratory effort, no problems with respiration noted  Abdomen: Soft, gravid, appropriate  for gestational age.  Pain/Pressure: Absent     Pelvic: Cervical exam deferred        Extremities: Normal range of motion.  Edema: Trace  Mental Status: Normal mood and affect. Normal behavior. Normal judgment and thought content.   Assessment and Plan:  Pregnancy: G3P2002 at 122w2d. Supervision of low-risk pregnancy, unspecified trimester --Anticipatory guidance about next visits/weeks of pregnancy given.   2. Restless legs syndrome (RLS) --Improved with magnesium, Flexeril  3. Arthralgia, unspecified joint --Pt reports hx generalized joint pain, no rheumatology work up. Pain is worse in pregnancy, multiple joints. --Pain and tingling of both hands, starting from elbows down, not c/w carpal tunnel on exam --Ice vs heat, Tylenol PRN, f/u with rheumatology PP  4. [redacted] weeks gestation of pregnancy   5. Stress and adjustment reaction --Pt tearful today, multiple physical complaints were addressed but pt worried about pregnancy, had recent loss of her father  - [facility]. Obesity affecting pregnancy in second trimester, unspecified obesity type --antenatal testing at 32 weeks --[facility]   Preterm labor symptoms and general obstetric precautions including but not limited to vaginal bleeding, contractions, leaking of fluid and fetal movement were reviewed in detail with the patient. Please refer to After Visit Summary for other counseling recommendations.   No follow-ups on file.  Future Appointments  Date Time Provider [place]3/26/2024  1:15 PM CrConcepcion LivingMD [facility]4/04/2023 10:15 AM [facility] NURSE [facility]/04/2023 10:30 AM [facility] US3 [facility]  LiFatima BlankCNM

## 2022-08-02 NOTE — Addendum Note (Signed)
Addended by: Fatima Blank A on: 08/02/2022 08:09 PM   Modules accepted: Orders

## 2022-08-04 ENCOUNTER — Other Ambulatory Visit: Payer: Self-pay | Admitting: Nurse Practitioner

## 2022-08-04 DIAGNOSIS — F332 Major depressive disorder, recurrent severe without psychotic features: Secondary | ICD-10-CM

## 2022-08-04 DIAGNOSIS — F411 Generalized anxiety disorder: Secondary | ICD-10-CM

## 2022-08-04 NOTE — Telephone Encounter (Signed)
Sent My-Chart message

## 2022-08-04 NOTE — Telephone Encounter (Signed)
Pt was told her amitriptyline was denied. Her father has passed away and she is pregnant again and under stress and needs some help with this refill.

## 2022-08-08 ENCOUNTER — Ambulatory Visit: Payer: Managed Care, Other (non HMO) | Admitting: Nurse Practitioner

## 2022-08-08 ENCOUNTER — Encounter: Payer: Self-pay | Admitting: Nurse Practitioner

## 2022-08-08 VITALS — BP 110/76 | HR 112 | Temp 99.0°F | Resp 16 | Ht 67.0 in | Wt 312.0 lb

## 2022-08-08 DIAGNOSIS — U071 COVID-19: Secondary | ICD-10-CM

## 2022-08-08 LAB — POCT INFLUENZA A/B
Influenza A, POC: NEGATIVE
Influenza B, POC: NEGATIVE

## 2022-08-08 LAB — POC COVID19 BINAXNOW: SARS Coronavirus 2 Ag: POSITIVE — AB

## 2022-08-08 MED ORDER — ALBUTEROL SULFATE HFA 108 (90 BASE) MCG/ACT IN AERS: 1.0000 | INHALATION_SPRAY | Freq: Four times a day (QID) | RESPIRATORY_TRACT | 0 refills | Status: AC | PRN

## 2022-08-08 MED ORDER — NIRMATRELVIR/RITONAVIR (PAXLOVID)TABLET: 3.0000 | ORAL_TABLET | Freq: Two times a day (BID) | ORAL | 0 refills | Status: AC

## 2022-08-08 NOTE — Progress Notes (Signed)
Established Patient Visit  Patient: Carmen Wall   DOB: [birth year]   32 y.o. Female  MRN: [id number] Visit Date: 08/08/2022  Subjective:    Chief Complaint  Patient presents with   Nasal Congestion    Started Saturday with chest pressure, then Sunday drainage, headache, cough, sinus pain and feels like it's just sitting in her chest.    URI  This is a new problem. The current episode started in the past 7 days. The problem has been unchanged. Maximum temperature: 99. The fever has been present for 1 to 2 days. Associated symptoms include congestion, coughing, headaches, joint pain, rhinorrhea, sinus pain and wheezing. Pertinent negatives include no sore throat or swollen glands. She has tried nothing for the symptoms.   Reviewed medical, surgical, and social history today  Medications: Outpatient Medications Prior to Visit  Medication Sig   amitriptyline (ELAVIL) 10 MG tablet Take 1 tablet (10 mg total) by mouth at bedtime.   aspirin EC 81 MG tablet Take 1 tablet (81 mg total) by mouth daily. Swallow whole.   cyclobenzaprine (FLEXERIL) 5 MG tablet Take 1-2 tablets (5-10 mg total) by mouth 3 (three) times daily as needed for muscle spasms.   Prenatal Vit-Fe Fumarate-FA (PREPLUS) 27-1 MG TABS Take 1 tablet by mouth daily.   [DISCONTINUED] albuterol (VENTOLIN HFA) 108 (90 Base) MCG/ACT inhaler Inhale into the lungs every 6 (six) hours as needed for wheezing or shortness of breath.   No facility-administered medications prior to visit.   Reviewed past medical and social history.   ROS per HPI above      Objective:  BP 110/76 (BP Location: Left Arm, Patient Position: Sitting, Cuff Size: Large)   Pulse (!) 112   Temp 99 F (37.2 C) (Oral)   Resp 16   Ht '5\' 7"'$  (1.702 m)   Wt (!) 312 lb (141.5 kg)   LMP 04/09/2022   SpO2 99%   BMI 48.87 kg/m      Physical Exam Vitals reviewed.  Constitutional:      General: She is not in acute  distress. Cardiovascular:     Rate and Rhythm: Normal rate and regular rhythm.     Pulses: Normal pulses.     Heart sounds: Normal heart sounds.  Pulmonary:     Effort: Pulmonary effort is normal.     Breath sounds: Normal breath sounds.  Neurological:     Mental Status: She is alert and oriented to person, place, and time.     Results for orders placed or performed in visit on 08/08/22  POC COVID-19  Result Value Ref Range   SARS Coronavirus 2 Ag Positive (A) Negative  POCT Influenza A/B  Result Value Ref Range   Influenza A, POC Negative Negative   Influenza B, POC Negative Negative      Assessment & Plan:    Problem List Items Addressed This Visit   None Visit Diagnoses     COVID-19    -  Primary   Relevant Medications   nirmatrelvir/ritonavir (PAXLOVID) 20 x 150 MG & 10 x '100MG'$  TABS   albuterol (VENTOLIN HFA) 108 (90 Base) MCG/ACT inhaler   Other Relevant Orders   POC COVID-19 (Completed)   POCT Influenza A/B (Completed)     Ok to use robitussin or mucinex DM for cough Ok to use tylenol or ibuprofen for fever and pain Ok to use saline nasal rinse for sinus  congestion. Use albuterol for wheezing and chest tightness Notify OBGYN about COVID diagnosis Go to ED if symptoms worsen.  Return if symptoms worsen or fail to improve.     Wilfred Lacy, NP

## 2022-08-08 NOTE — Patient Instructions (Addendum)
Ok to use robitussin or mucinex DM for cough Ok to use tylenol or ibuprofen for fever and pain Ok to use saline nasal rinse for sinus congestion. Use albuterol for wheezing and chest tightness Notify OBGYN about COVID diagnosis Go to ED if symptoms worsen.  COVID-19 COVID-19 is an infection caused by a virus called SARS-CoV-2. Most people who get COVID-19 have mild to moderate symptoms. Some have little to no symptoms. In others, the virus may cause a severe infection. What are the causes? COVID-19 is caused by a coronavirus. The virus may be in the air as droplets or as tiny specks of fluid (aerosols). It may also be on surfaces. You may catch the virus if you: Breathe in droplets when a person with COVID-19 breathes, speaks, sings, coughs, or sneezes. Touch something that has the virus on it and then touch your mouth, nose, or eyes. What increases the risk? Risk for infection: You are more likely to get COVID-19 if: You are within 6 ft (1.8 m) of a person who has COVID-19 for 15 minutes or longer. You provide care to a person who has COVID-19. You are in close contact with others. This includes hugging, kissing, or sharing utensils. Risk for serious illness caused by COVID-19: You are more likely to get very ill from COVID-19 if: You have cancer. You have a long-term (chronic) disease. This may be: A chronic lung disease, such as pulmonary embolism, chronic obstructive pulmonary disease (COPD), or cystic fibrosis. A disease that affects your body's defense system (immune system). If you have a weak immune system, you are said to be immunocompromised. A serious heart condition, such as heart failure, coronary artery disease, or cardiomyopathy. Diabetes. Chronic kidney disease. A liver disease, such as cirrhosis, nonalcoholic fatty liver disease, alcoholic liver disease, or autoimmune hepatitis. You are obese. You are pregnant or were just pregnant. You have sickle cell disease. What  are the signs or symptoms? Symptoms of COVID-19 can range from mild to severe. They may appear any time from 2 to 14 days after you are exposed. They include: Fever or chills. Shortness of breath or trouble breathing. Feeling tired. Headaches, body aches, or muscle aches. A runny or stuffy nose. Sneezing, coughing, or a sore throat. New loss of taste or smell. You may also have stomach problems, such as nausea, vomiting, or diarrhea. In some cases, you may not have any symptoms. How is this diagnosed? COVID-19 may be diagnosed by testing a sample to check for the virus. The most common tests are the PCR test and the antigen test. Tests may be done in the lab or at home. They include: Using a swab to take a sample of fluid from your nose. Testing a sample of saliva from your mouth. Testing a sample of mucus from your lungs (sputum). How is this treated? Treatment for COVID-19 depends on how severe your condition is. Mild symptoms can be treated at home. You should rest, drink fluids, and take over-the-counter medicine. If you have symptoms and risk factors, you may be prescribed a medicine that fights viruses (antiviral). Severe symptoms may be treated in a hospital intensive care unit (ICU). Treatment may include: Extra oxygen given through a tube in the nose, a face mask, or a hood. Medicines. These may include: Antivirals, such as remdesivir. Anti-inflammatories, such as corticosteroids. These help reduce inflammation. Antithrombotics. These help prevent or treat blood clots. Convalescent plasma. This helps boost your immune system. Prone positioning. This is when you are laid on  your stomach to help oxygen get into your lungs. Infection control measures. If you are at risk for a more serious illness, your health care provider may prescribe two medicines to help your immune system protect you. These are called long-acting monoclonal antibodies. They are given together every 6  months. How is this prevented? To protect yourself: Get the vaccine or vaccine series if you meet the guidelines. You can even get the vaccine while you are pregnant or making breast milk (lactating). Get an added dose of the vaccine if you are immunocompromised. This applies if you have had an organ transplant or if you have a condition that affects your immune system. You should get the added dose 4 weeks after you got the first one. If you get an mRNA vaccine, you will need to get 3 doses. Talk to your provider about getting experimental monoclonal antibodies. This treatment can help prevent severe illness. It may be given to you if: You are immunocompromised. You cannot get the vaccine. You may not get the vaccine if you have a severe allergic reaction to it or to what it is made of. You are not fully vaccinated. You are in a place where there is COVID-19 and: You are in close contact with someone who has COVID-19. You are at high risk of being exposed. You are at risk of illness from new variants of the virus. To protect others: If you have symptoms of COVID-19, take steps to stop the virus from spreading. Stay home. Leave your house only to get medical care. Do not use public transit. Do not travel while you are sick. Wash your hands often with soap and water for at least 20 seconds. If soap and water are not available, use alcohol-based hand sanitizer. Make sure that all people in your household wash their hands well and often. Cough or sneeze into a tissue or your sleeve or elbow. Do not cough or sneeze into your hand or into the air. Where to find more information Centers for Disease Control and Prevention (CDC): StoreMirror.com.cy World Health Organization [facility]): http://curry.org/ Get help right away if: You have trouble breathing. You have pain or pressure in your chest. You are confused. Your lips or fingernails turn blue. You have trouble waking from sleep. Your symptoms get worse. These  symptoms may be an emergency. Get help right away. Call 911. Do not wait to see if the symptoms will go away. Do not drive yourself to the hospital. This information is not intended to replace advice given to you by your health care provider. Make sure you discuss any questions you have with your health care provider. Document Revised: 02/04/2022 Document Reviewed: 02/04/2022 Elsevier Patient Education  Carmen Wall.

## 2022-08-09 NOTE — BH Specialist Note (Deleted)
Integrated Behavioral Health via Telemedicine Visit  08/09/2022 AADRIKA JUNGBLUTH [id number]  Number of Integrated Behavioral Health Clinician visits: No data recorded Session Start time: No data recorded  Session End time: No data recorded Total time in minutes: No data recorded  Referring Provider: *** Patient/Family location: *** [facility] Provider location: *** All persons participating in visit: *** Types of Service: {CHL AMB TYPE OF SERVICE:4240716730}  I connected with [place] and/or Maytal M MCPHERSON-CLARKE's {family members:20773} via  Telephone or Video Enabled Telemedicine Application  (Video is Caregility application) and verified that I am speaking with the correct person using two identifiers. Discussed confidentiality: {YES/NO:21197}  I discussed the limitations of telemedicine and the availability of in person appointments.  Discussed there is a possibility of technology failure and discussed alternative modes of communication if that failure occurs.  I discussed that engaging in this telemedicine visit, they consent to the provision of behavioral healthcare and the services will be billed under their insurance.  Patient and/or legal guardian expressed understanding and consented to Telemedicine visit: {YES/NO:21197}  Presenting Concerns: Patient and/or family reports the following symptoms/concerns: *** Duration of problem: ***; Severity of problem: {Mild/Moderate/Severe:20260}  Patient and/or Family's Strengths/Protective Factors: {CHL AMB BH PROTECTIVE FACTORS:[phone]}  Goals Addressed: Patient will:  Reduce symptoms of: {IBH Symptoms:21014056}   Increase knowledge and/or ability of: {IBH Patient Tools:21014057}   Demonstrate ability to: {IBH Goals:21014053}  Progress towards Goals: {CHL AMB BH PROGRESS TOWARDS [phone]}  Interventions: Interventions utilized:  {IBH Interventions:21014054} Standardized  Assessments completed: {IBH Screening Tools:21014051}  Patient and/or Family Response: ***  Assessment: Patient currently experiencing ***.   Patient may benefit from ***.  Plan: Follow up with behavioral health clinician on : *** Behavioral recommendations: *** Referral(s): {IBH Referrals:21014055}  I discussed the assessment and treatment plan with the patient and/or parent/guardian. They were provided an opportunity to ask questions and all were answered. They agreed with the plan and demonstrated an understanding of the instructions.   They were advised to call back or seek an in-person evaluation if the symptoms worsen or if the condition fails to improve as anticipated.  Caroleen Hamman Ellamay Fors, LCSW

## 2022-08-11 NOTE — BH Specialist Note (Signed)
Integrated Behavioral Health via Telemedicine Visit  08/25/2022 TIFINI HOXIT [id number]  Number of [place] Clinician visits: 1- Initial Visit  Session Start time: [id number]   Session End time: 1401  Total time in minutes: 51   Referring Provider: Fatima Blank, CNM Patient/Family location: Home [facility] Provider location: [facility] [place] [facility]  All persons participating in visit: Patient Carmen Wall and [place]   Types of Service: Individual psychotherapy and Video visit  I connected with Geryl Rankins MCPHERSON-CLARKE and/or [place]  via  Telephone or Video Enabled Telemedicine Application  (Video is Caregility application) and verified that I am speaking with the correct person using two identifiers. Discussed confidentiality: Yes   I discussed the limitations of telemedicine and the availability of in person appointments.  Discussed there is a possibility of technology failure and discussed alternative modes of communication if that failure occurs.  I discussed that engaging in this telemedicine visit, they consent to the provision of behavioral healthcare and the services will be billed under their insurance.  Patient and/or legal guardian expressed understanding and consented to Telemedicine visit: Yes   Presenting Concerns: Patient and/or family reports the following symptoms/concerns: Grieving loss of father (end of January 2024) and increased anxiety with panic, tension headaches; stress of change in providers due to insurance and history of separation anxiety after going back to work after previous birth (will not have as much time off after upcoming childbirth); sleeping and eating well with good support at home. Pt also concerned her employer says FMLA forms were missing the long-term information and requests to be resent.  Duration of  problem: Ongoing anxiety with recent increase; grief less than two months; Severity of problem: moderate  Patient and/or Family's Strengths/Protective Factors: Social connections and Sense of purpose  Goals Addressed: Patient will:  Reduce symptoms of: anxiety, depression, and stress   Increase knowledge and/or ability of: self-management skills   Demonstrate ability to: Increase healthy adjustment to current life circumstances  Progress towards Goals: Ongoing  Interventions: Interventions utilized:  Mindfulness or Psychologist, educational, Functional Assessment of ADLs, and Psychoeducation and/or Health Education Standardized Assessments completed: Not Needed  Patient and/or Family Response: Patient agrees with treatment plan.   Assessment: Patient currently experiencing Grief; Generalized anxiety disorder.   Patient may benefit from psychoeducation and brief therapeutic interventions regarding coping with symptoms of anxiety and current grief .  Plan: Follow up with behavioral health clinician on : Two weeks Behavioral recommendations:  -Continue taking prenatal vitamin daily as recommended -Continue using icy hot and heating pads to help manage tension headaches; discuss with medical provider at upcoming visit -CALM relaxation breathing exercise twice daily (morning; at bedtime with sleep sounds); as needed throughout the day. -Read through After Visit Summary information; use as needed Referral(s): [place] (In Clinic)  I discussed the assessment and treatment plan with the patient and/or parent/guardian. They were provided an opportunity to ask questions and all were answered. They agreed with the plan and demonstrated an understanding of the instructions.   They were advised to call back or seek an in-person evaluation if the symptoms worsen or if the condition fails to improve as anticipated.  Alfred, LCSW     07/04/2022    1:45 PM  01/12/2022    2:02 PM 12/16/2021    2:33 PM 06/12/2020    8:34 AM 07/03/2019   11:08 AM  Depression screen PHQ  2/9  Decreased Interest 1 1 2 1 2   Down, Depressed, Hopeless 1 1 3  0 3  PHQ - 2 Score 2 2 5 1 5   Altered sleeping 3 2 3 3 2   Tired, decreased energy 3 3 2 3 2   Change in appetite 2 1 1 2 3   Feeling bad or failure about yourself  0 0 1 0 2  Trouble concentrating 1 0 2 2 1   Moving slowly or fidgety/restless 1 1 2 2  0  Suicidal thoughts 0 0 0 0 0  PHQ-9 Score 12 9 16 13 15   Difficult doing work/chores  Somewhat difficult Somewhat difficult Somewhat difficult Extremely dIfficult      07/04/2022    1:46 PM 01/12/2022    2:02 PM 12/16/2021    2:33 PM 06/12/2020    8:34 AM  GAD 7 : Generalized Anxiety Score  Nervous, Anxious, on Edge 1 1 3 1   Control/stop worrying 3 1 3  0  Worry too much - different things 2 1 3 1   Trouble relaxing 3 1 2  0  Restless 1 0 1 2  Easily annoyed or irritable 1 2 3 1   Afraid - awful might happen 1 0 1 0  Total GAD 7 Score 12 6 16 5   Anxiety Difficulty  Somewhat difficult Very difficult Somewhat difficult

## 2022-08-25 ENCOUNTER — Ambulatory Visit (INDEPENDENT_AMBULATORY_CARE_PROVIDER_SITE_OTHER): Payer: Self-pay | Admitting: Clinical

## 2022-08-25 DIAGNOSIS — F411 Generalized anxiety disorder: Secondary | ICD-10-CM

## 2022-08-25 DIAGNOSIS — F4321 Adjustment disorder with depressed mood: Secondary | ICD-10-CM

## 2022-08-25 NOTE — Patient Instructions (Addendum)
Center for [facility] [place], [place] [address] [phone] (main office) [phone] [facility] office)  [email or web address]  [place] [facility] [email or web address]   /Emotional Wellbeing Apps and Websites Here are a few free apps meant to help you to help yourself.  To find, try searching on the internet to see if the app is offered on Apple/Android devices. If your first choice doesn't come up on your device, the good news is that there are many choices! Play around with different apps to see which ones are helpful to you.    Calm This is an app meant to help increase calm feelings. Includes info, strategies, and tools for tracking your feelings.      Calm Harm  This app is meant to help with self-harm. Provides many 5-minute or 15-min coping strategies for doing instead of hurting yourself.       Spencer is a problem-solving tool to help deal with emotions and cope with stress you encounter wherever you are.      MindShift This app can help people cope with anxiety. Rather than trying to avoid anxiety, you can make an important shift and face it.      MY3  MY3 features a support system, safety plan and resources with the goal of offering a tool to use in a time of need.       My Life My Voice  This mood journal offers a simple solution for tracking your thoughts, feelings and moods. Animated emoticons can help identify your mood.       Relax Melodies Designed to help with sleep, on this app you can mix sounds and meditations for relaxation.      Smiling Mind Smiling Mind is meditation made easy: it's a simple tool that helps put a smile on your mind.        Stop, Breathe & Think  A friendly, simple guide for people through meditations for mindfulness and compassion.  Stop, Breathe and Think Kids Enter your current feelings and choose a "mission" to help you cope.  Offers videos for certain moods instead of just sound recordings.       Team Orange The goal of this tool is to help teens change how they think, act, and react. This app helps you focus on your own good feelings and experiences.      The [place] Box The [place] Box (VHB) contains simple tools to help patients with coping, relaxation, distraction, and positive thinking.

## 2022-08-30 ENCOUNTER — Other Ambulatory Visit: Payer: Self-pay

## 2022-08-30 ENCOUNTER — Ambulatory Visit (INDEPENDENT_AMBULATORY_CARE_PROVIDER_SITE_OTHER): Payer: Managed Care, Other (non HMO) | Admitting: Family Medicine

## 2022-08-30 VITALS — BP 151/68 | HR 103 | Wt 317.0 lb

## 2022-08-30 DIAGNOSIS — L299 Pruritus, unspecified: Secondary | ICD-10-CM

## 2022-08-30 DIAGNOSIS — F411 Generalized anxiety disorder: Secondary | ICD-10-CM

## 2022-08-30 DIAGNOSIS — Z0289 Encounter for other administrative examinations: Secondary | ICD-10-CM

## 2022-08-30 DIAGNOSIS — O99212 Obesity complicating pregnancy, second trimester: Secondary | ICD-10-CM

## 2022-08-30 DIAGNOSIS — O0992 Supervision of high risk pregnancy, unspecified, second trimester: Secondary | ICD-10-CM

## 2022-08-30 DIAGNOSIS — O162 Unspecified maternal hypertension, second trimester: Secondary | ICD-10-CM

## 2022-08-30 DIAGNOSIS — Z3A20 20 weeks gestation of pregnancy: Secondary | ICD-10-CM

## 2022-08-30 NOTE — Progress Notes (Signed)
   PRENATAL VISIT NOTE  Subjective:  Carmen Wall is a 32 y.o. G3P2002 at [redacted]w[redacted]d being seen today for ongoing prenatal care.  She is currently monitored for the following issues for this {Blank single:19197::"high-risk","low-risk"} pregnancy and has Rhinitis, allergic; Migraine without aura; Arthritis; Allergy; Depression; Chronic right-sided low back pain with right-sided sciatica; Acute pain of right knee; Obesity; GAD (generalized anxiety disorder); Jumper's knee of right side; Acute right ankle pain; Excessive daytime sleepiness; Cough variant asthma; Piriformis syndrome of right side; Insulin resistance; Vitamin D deficiency; Other hyperlipidemia; Backache; Neck pain; Radial styloid tenosynovitis; History of gestational hypertension; Myalgia; Iron deficiency anemia due to chronic blood loss; Primary hypertension; Supervision of high-risk pregnancy; History of cholestasis during pregnancy; Restless legs syndrome (RLS); Chronic tension-type headache, not intractable; Insomnia; and Obesity affecting pregnancy, antepartum on their problem list.  Patient reports {sx:14538}.  Contractions: Not present. Vag. Bleeding: None.  Movement: Present. Denies leaking of fluid.   The following portions of the patient's history were reviewed and updated as appropriate: allergies, current medications, past family history, past medical history, past social history, past surgical history and problem list.   Objective:   Vitals:   08/30/22 1320  BP: (!) 151/68  Pulse: (!) 103  Weight: (!) 317 lb (143.8 kg)    Fetal Status: Fetal Heart Rate (bpm): 152   Movement: Present     General:  Alert, oriented and cooperative. Patient is in no acute distress.  Skin: Skin is warm and dry. No rash noted.   Cardiovascular: Normal heart rate noted  Respiratory: Normal respiratory effort, no problems with respiration noted  Abdomen: Soft, gravid, appropriate for gestational age.  Pain/Pressure: Absent      Pelvic: {Blank single:19197::"Cervical exam performed in the presence of a chaperone","Cervical exam deferred"}        Extremities: Normal range of motion.     Mental Status: Normal mood and affect. Normal behavior. Normal judgment and thought content.   Assessment and Plan:  Pregnancy: G3P2002 at [redacted]w[redacted]d 1. Supervision of high risk pregnancy in second trimester *** - AFP, Serum, Open Spina Bifida  2. GAD (generalized anxiety disorder) ***  3. Obesity affecting pregnancy in second trimester, unspecified obesity type ***  4. [redacted] weeks gestation of pregnancy ***  5. Itching *** - Comp Met (CMET) - Bile acids, total  6. Elevated blood pressure affecting pregnancy in second trimester, antepartum *** - Comp Met (CMET) - Protein / creatinine ratio, urine  {Blank single:19197::"Term","Preterm"} labor symptoms and general obstetric precautions including but not limited to vaginal bleeding, contractions, leaking of fluid and fetal movement were reviewed in detail with the patient. Please refer to After Visit Summary for other counseling recommendations.   No follow-ups on file.  Future Appointments  Date Time Provider [place]  09/13/2022  1:15 PM [place] [facility]  09/15/2022 10:15 AM [facility] NURSE [facility]  09/15/2022 10:30 AM [facility] US3 [facility] [place]    Concepcion Living, MD

## 2022-08-31 ENCOUNTER — Telehealth: Payer: Self-pay

## 2022-08-31 NOTE — Telephone Encounter (Signed)
Spoke with patient regarding her blood pressure.  She says it was not rechecked in the office.  She checked it at home and believes it was 129/68.  Requested she come back to the office.  She says she is unable to come in for an appointment but can have the nurse at work check her pressure.  Dr Caron Presume informed.

## 2022-09-01 LAB — COMPREHENSIVE METABOLIC PANEL
ALT: 12 IU/L (ref 0–32)
AST: 13 IU/L (ref 0–40)
Albumin/Globulin Ratio: 1.5 (ref 1.2–2.2)
Albumin: 3.7 g/dL — ABNORMAL LOW (ref 3.9–4.9)
Alkaline Phosphatase: 82 IU/L (ref 44–121)
BUN/Creatinine Ratio: 10 (ref 9–23)
BUN: 6 mg/dL (ref 6–20)
Bilirubin Total: <0.2 mg/dL (ref 0.0–1.2)
CO2: 19 mmol/L — ABNORMAL LOW (ref 20–29)
Calcium: 9 mg/dL (ref 8.7–10.2)
Chloride: 104 mmol/L (ref 96–106)
Creatinine, Ser: 0.58 mg/dL (ref 0.57–1.00)
Globulin, Total: 2.4 g/dL (ref 1.5–4.5)
Glucose: 159 mg/dL — ABNORMAL HIGH (ref 70–99)
Potassium: 3.8 mmol/L (ref 3.5–5.2)
Sodium: 138 mmol/L (ref 134–144)
Total Protein: 6.1 g/dL (ref 6.0–8.5)
eGFR: 122 mL/min/{1.73_m2} (ref >59)

## 2022-09-01 LAB — AFP, SERUM, OPEN SPINA BIFIDA
AFP MoM: 2.24
AFP Value: 85.6 ng/mL
Gest. Age on Collection Date: 20 weeks
Maternal Age At EDD: 33.9 yr
OSBR Risk 1 IN: 482
Test Results:: NEGATIVE
Weight: 317 [lb_av]

## 2022-09-01 LAB — PROTEIN / CREATININE RATIO, URINE
Creatinine, Urine: 180.8 mg/dL
Protein, Ur: 15.1 mg/dL
Protein/Creat Ratio: 84 mg/g creat (ref 0–200)

## 2022-09-01 LAB — BILE ACIDS, TOTAL: Bile Acids Total: 2.3 umol/L (ref 0.0–10.0)

## 2022-09-05 ENCOUNTER — Telehealth: Payer: Self-pay | Admitting: Family Medicine

## 2022-09-05 NOTE — BH Specialist Note (Signed)
Integrated Behavioral Health via Telemedicine Visit  09/13/2022 [name] [id number]  Number of Integrated Behavioral Health Clinician visits: 2- Second Visit  Session Start time: 1320   Session End time: 1350  Total time in minutes: 30   Referring Provider: Sharen Counter, CNM Patient/Family location: Home [name] Provider location: [facility]  All persons participating in visit: Patient Carmen Wall and [name]   Types of Service: Individual psychotherapy and Video visit  I connected with Carmen Wall and/or Carmen Wall's  n/a  via  Telephone or Video Enabled Telemedicine Application  (Video is Caregility application) and verified that I am speaking with the correct person using two identifiers. Discussed confidentiality: Yes   I discussed the limitations of telemedicine and the availability of in person appointments.  Discussed there is a possibility of technology failure and discussed alternative modes of communication if that failure occurs.  I discussed that engaging in this telemedicine visit, they consent to the provision of behavioral healthcare and the services will be billed under their insurance.  Patient and/or legal guardian expressed understanding and consented to Telemedicine visit: Yes   Presenting Concerns: Patient and/or family reports the following symptoms/concerns: Anxiety, panic, feeling more emotional than usual, worrying about things outside of control and random thoughts at work preventing focus on current task; pt open to implementing additional self-coping strategy today. Pt also concerned about needing physical therapy appointment.  Duration of problem: Increase current pregnancy; Severity of problem: moderate  Patient and/or Family's Strengths/Protective Factors: Social connections and Sense of purpose  Goals  Addressed: Patient will:  Reduce symptoms of: anxiety, depression, and stress   Increase knowledge and/or ability of: self-management skills   Demonstrate ability to: Increase healthy adjustment to current life circumstances and Increase motivation to adhere to plan of care  Progress towards Goals: Ongoing  Interventions: Interventions utilized:  CBT Cognitive Behavioral Therapy Standardized Assessments completed: GAD-7 and PHQ 9  Patient and/or Family Response: Patient agrees with treatment plan.   Assessment: Patient currently experiencing Generalized anxiety disorder and Grief.   Patient may benefit from continued therapeutic interventions.  Plan: Follow up with behavioral health clinician on : Two weeks Behavioral recommendations:  -Continue daily prenatal vitamin as recommended -Continue using self-coping strategies daily (icy hot/heating pads, relaxation breathing, etc.) -Begin Worry Time strategy, as discussed. Start by setting up start and end time reminders on phone today; continue daily for two weeks.  Referral(s): Integrated [employer] (In Clinic)  I discussed the assessment and treatment plan with the patient and/or parent/guardian. They were provided an opportunity to ask questions and all were answered. They agreed with the plan and demonstrated an understanding of the instructions.   They were advised to call back or seek an in-person evaluation if the symptoms worsen or if the condition fails to improve as anticipated.  [name] Kynadi Dragos, LCSW     09/13/2022    1:24 PM 07/04/2022    1:45 PM 01/12/2022    2:02 PM 12/16/2021    2:33 PM 06/12/2020    8:34 AM  Depression screen PHQ 2/9  Decreased Interest 2 1 1 2 1   Down, Depressed, Hopeless 1 1 1 3  0  PHQ - 2 Score 3 2 2 5 1   Altered sleeping 3 3 2 3 3   Tired, decreased energy 3 3 3 2 3   Change in appetite 0 2 1 1 2   Feeling bad or failure about yourself  0 0 0 1 0  Trouble concentrating 0 1 0 2  2  Moving slowly or fidgety/restless 0 1 1 2 2   Suicidal thoughts 0 0 0 0 0  PHQ-9 Score 9 12 9 16 13   Difficult doing work/chores   Somewhat difficult Somewhat difficult Somewhat difficult      09/13/2022    1:29 PM 07/04/2022    1:46 PM 01/12/2022    2:02 PM 12/16/2021    2:33 PM  GAD 7 : Generalized Anxiety Score  Nervous, Anxious, on Edge 1 1 1 3   Control/stop worrying 1 3 1 3   Worry too much - different things 1 2 1 3   Trouble relaxing 0 3 1 2   Restless 1 1 0 1  Easily annoyed or irritable 2 1 2 3   Afraid - awful might happen 0 1 0 1  Total GAD 7 Score 6 12 6 16   Anxiety Difficulty   Somewhat difficult Very difficult

## 2022-09-05 NOTE — Telephone Encounter (Signed)
Attempted to reach patient. She stated her FMLA papers were filled out incorrectly.

## 2022-09-13 ENCOUNTER — Ambulatory Visit (INDEPENDENT_AMBULATORY_CARE_PROVIDER_SITE_OTHER): Payer: Self-pay | Admitting: Clinical

## 2022-09-13 DIAGNOSIS — F4321 Adjustment disorder with depressed mood: Secondary | ICD-10-CM

## 2022-09-13 DIAGNOSIS — F411 Generalized anxiety disorder: Secondary | ICD-10-CM

## 2022-09-13 NOTE — Patient Instructions (Signed)
[facility] [address] [place], [place] [address] [phone] (main office) [phone] (Jaque Dacy's office)   

## 2022-09-15 ENCOUNTER — Ambulatory Visit: Payer: Managed Care, Other (non HMO) | Attending: Advanced Practice Midwife

## 2022-09-15 ENCOUNTER — Encounter: Payer: Self-pay | Admitting: *Deleted

## 2022-09-15 ENCOUNTER — Ambulatory Visit: Payer: Managed Care, Other (non HMO) | Admitting: *Deleted

## 2022-09-15 ENCOUNTER — Ambulatory Visit: Payer: Managed Care, Other (non HMO) | Attending: Advanced Practice Midwife | Admitting: Obstetrics

## 2022-09-15 ENCOUNTER — Other Ambulatory Visit: Payer: Self-pay | Admitting: *Deleted

## 2022-09-15 VITALS — BP 131/72 | HR 91

## 2022-09-15 DIAGNOSIS — O09292 Supervision of pregnancy with other poor reproductive or obstetric history, second trimester: Secondary | ICD-10-CM | POA: Insufficient documentation

## 2022-09-15 DIAGNOSIS — R030 Elevated blood-pressure reading, without diagnosis of hypertension: Secondary | ICD-10-CM | POA: Diagnosis not present

## 2022-09-15 DIAGNOSIS — Z3A22 22 weeks gestation of pregnancy: Secondary | ICD-10-CM | POA: Insufficient documentation

## 2022-09-15 DIAGNOSIS — Z362 Encounter for other antenatal screening follow-up: Secondary | ICD-10-CM

## 2022-09-15 DIAGNOSIS — O99212 Obesity complicating pregnancy, second trimester: Secondary | ICD-10-CM | POA: Diagnosis not present

## 2022-09-15 DIAGNOSIS — O26642 Intrahepatic cholestasis of pregnancy, second trimester: Secondary | ICD-10-CM | POA: Diagnosis not present

## 2022-09-15 DIAGNOSIS — O0992 Supervision of high risk pregnancy, unspecified, second trimester: Secondary | ICD-10-CM | POA: Diagnosis present

## 2022-09-15 DIAGNOSIS — O10912 Unspecified pre-existing hypertension complicating pregnancy, second trimester: Secondary | ICD-10-CM | POA: Insufficient documentation

## 2022-09-15 DIAGNOSIS — Z3491 Encounter for supervision of normal pregnancy, unspecified, first trimester: Secondary | ICD-10-CM | POA: Insufficient documentation

## 2022-09-15 DIAGNOSIS — Z363 Encounter for antenatal screening for malformations: Secondary | ICD-10-CM | POA: Diagnosis not present

## 2022-09-15 DIAGNOSIS — O26892 Other specified pregnancy related conditions, second trimester: Secondary | ICD-10-CM | POA: Diagnosis not present

## 2022-09-15 NOTE — Progress Notes (Signed)
MFM Note  Carmen Wall was seen for a detailed fetal anatomy scan due to maternal obesity with a BMI of 47.  She reports that her prior pregnancy was complicated by cholestasis of pregnancy where she had severe whole body itching symptoms but her bile acids never increased until close to delivery.  Her total bile acids drawn earlier in her current pregnancy were within normal limits.  She reports that she is starting to develop itching symptoms again in her current pregnancy.  She had a cell free DNA test earlier in her pregnancy which indicated a low risk for trisomy 40, 44, and 13. A female fetus is predicted.   She was informed that the fetal growth and amniotic fluid level were appropriate for her gestational age.   There were no obvious fetal anomalies noted on today's ultrasound exam.  However, today's exam was limited due to the fetal position and maternal body habitus.  The patient was informed that anomalies may be missed due to technical limitations. If the fetus is in a suboptimal position or maternal habitus is increased, visualization of the fetus in the maternal uterus may be impaired.  Due to maternal obesity and possible cholestasis of pregnancy, we will continue to follow her with monthly growth scans.    Should her itching symptoms persist, she should be started on Actigall treatment regardless of her total bile acid levels.    Should she be diagnosed with cholestasis of pregnancy again in her current pregnancy, we will start weekly fetal testing at 32 weeks with delivery at around 37 weeks.   A follow-up exam was scheduled in 4 weeks to complete the views of the fetal anatomy and to assess the fetal growth.  The patient stated that all of her questions were answered today.  A total of 30 minutes was spent counseling and coordinating the care for this patient.  Greater than 50% of the time was spent in direct face-to-face contact.

## 2022-09-19 ENCOUNTER — Encounter: Payer: Self-pay | Admitting: *Deleted

## 2022-09-19 NOTE — BH Specialist Note (Deleted)
Integrated Behavioral Health via Telemedicine Visit  09/19/2022 DRAYAH BARCENA [id number]  Number of Integrated Behavioral Health Clinician visits: 2- Second Visit  Session Start time: 1320   Session End time: 1350  Total time in minutes: 30   Referring Provider: *** Patient/Family location: *** [facility] Provider location: *** All persons participating in visit: *** Types of Service: {CHL AMB TYPE OF SERVICE:[phone]}  I connected with Cordelia Poche MCPHERSON-CLARKE and/or Aslyn M MCPHERSON-CLARKE's {family members:20773} via  Telephone or Video Enabled Telemedicine Application  (Video is Caregility application) and verified that I am speaking with the correct person using two identifiers. Discussed confidentiality: {YES/NO:21197}  I discussed the limitations of telemedicine and the availability of in person appointments.  Discussed there is a possibility of technology failure and discussed alternative modes of communication if that failure occurs.  I discussed that engaging in this telemedicine visit, they consent to the provision of behavioral healthcare and the services will be billed under their insurance.  Patient and/or legal guardian expressed understanding and consented to Telemedicine visit: {YES/NO:21197}  Presenting Concerns: Patient and/or family reports the following symptoms/concerns: *** Duration of problem: ***; Severity of problem: {Mild/Moderate/Severe:20260}  Patient and/or Family's Strengths/Protective Factors: {CHL AMB BH PROTECTIVE [phone]}  Goals Addressed: Patient will:  Reduce symptoms of: {IBH Symptoms:21014056}   Increase knowledge and/or ability of: {IBH Patient Tools:21014057}   Demonstrate ability to: {IBH Goals:21014053}  Progress towards Goals: {CHL AMB BH PROGRESS TOWARDS GOALS:[id number]}  Interventions: Interventions utilized:  {IBH Interventions:21014054} Standardized Assessments completed: {IBH Screening  Tools:21014051}  Patient and/or Family Response: ***  Assessment: Patient currently experiencing ***.   Patient may benefit from ***.  Plan: Follow up with behavioral health clinician on : *** Behavioral recommendations: *** Referral(s): {IBH Referrals:21014055}  I discussed the assessment and treatment plan with the patient and/or parent/guardian. They were provided an opportunity to ask questions and all were answered. They agreed with the plan and demonstrated an understanding of the instructions.   They were advised to call back or seek an in-person evaluation if the symptoms worsen or if the condition fails to improve as anticipated.  [name] Syd Newsome, LCSW

## 2022-09-22 ENCOUNTER — Telehealth: Payer: Self-pay

## 2022-09-27 ENCOUNTER — Ambulatory Visit: Payer: Managed Care, Other (non HMO) | Admitting: Advanced Practice Midwife

## 2022-09-27 ENCOUNTER — Telehealth: Payer: Self-pay | Admitting: Family Medicine

## 2022-09-27 ENCOUNTER — Other Ambulatory Visit (HOSPITAL_COMMUNITY)
Admission: RE | Admit: 2022-09-27 | Discharge: 2022-09-27 | Disposition: A | Discharge status: Home / Self Care | Payer: Managed Care, Other (non HMO) | Source: Ambulatory Visit | Attending: Advanced Practice Midwife | Admitting: Advanced Practice Midwife

## 2022-09-27 ENCOUNTER — Other Ambulatory Visit: Payer: Self-pay

## 2022-09-27 VITALS — BP 136/80 | HR 96

## 2022-09-27 DIAGNOSIS — O99212 Obesity complicating pregnancy, second trimester: Secondary | ICD-10-CM | POA: Diagnosis not present

## 2022-09-27 DIAGNOSIS — L292 Pruritus vulvae: Secondary | ICD-10-CM | POA: Diagnosis present

## 2022-09-27 DIAGNOSIS — Z8719 Personal history of other diseases of the digestive system: Secondary | ICD-10-CM

## 2022-09-27 DIAGNOSIS — O0992 Supervision of high risk pregnancy, unspecified, second trimester: Secondary | ICD-10-CM | POA: Diagnosis not present

## 2022-09-27 DIAGNOSIS — O99712 Diseases of the skin and subcutaneous tissue complicating pregnancy, second trimester: Secondary | ICD-10-CM

## 2022-09-27 DIAGNOSIS — Z3A24 24 weeks gestation of pregnancy: Secondary | ICD-10-CM

## 2022-09-27 DIAGNOSIS — L299 Pruritus, unspecified: Secondary | ICD-10-CM

## 2022-09-27 DIAGNOSIS — Z8759 Personal history of other complications of pregnancy, childbirth and the puerperium: Secondary | ICD-10-CM | POA: Diagnosis not present

## 2022-09-27 DIAGNOSIS — O99210 Obesity complicating pregnancy, unspecified trimester: Secondary | ICD-10-CM

## 2022-09-27 MED ORDER — TRIAMCINOLONE ACETONIDE 0.1 % EX CREA: TOPICAL_CREAM | Freq: Three times a day (TID) | CUTANEOUS | Status: DC

## 2022-09-27 MED ORDER — URSODIOL 300 MG PO CAPS: 300.0000 mg | ORAL_CAPSULE | Freq: Two times a day (BID) | ORAL | 4 refills | Status: DC

## 2022-09-27 NOTE — Patient Instructions (Signed)

## 2022-09-27 NOTE — Telephone Encounter (Signed)
Patient was originally on [employer] Schedule today at 10:15 am. Her schedule was overbooked and [place] had a few slots opened so I moved her appointment over to balance the schedule.  The patient was arrived but she waited for a very long time, she got upset and left, I called the patient to try to explain to her what happened, she said she wasn't coming back due to every time she come here it's always a long wait and her paper work not being filled out correctly.  I told her I would have the office manager reach out to her and she said OK.

## 2022-09-27 NOTE — Progress Notes (Signed)
PT scheduled on 10/05/22 at 1015 at [facility].  Pt notified.    Leonette Nutting 09/27/22

## 2022-09-27 NOTE — Progress Notes (Signed)
PRENATAL VISIT NOTE  Subjective:  Carmen Wall is a 32 y.o. G3P2002 at [redacted]w[redacted]d being seen today for ongoing prenatal care.  She is currently monitored for the following issues for this low-risk pregnancy and has Rhinitis, allergic; Migraine without aura; Arthritis; Allergy; Depression; Chronic right-sided low back pain with right-sided sciatica; Acute pain of right knee; Obesity; GAD (generalized anxiety disorder); Jumper's knee of right side; Acute right ankle pain; Excessive daytime sleepiness; Cough variant asthma; Piriformis syndrome of right side; Insulin resistance; Vitamin D deficiency; Other hyperlipidemia; Backache; Neck pain; Radial styloid tenosynovitis; History of gestational hypertension; Myalgia; Iron deficiency anemia due to chronic blood loss; Primary hypertension; Supervision of high-risk pregnancy; History of cholestasis during pregnancy; Restless legs syndrome (RLS); Chronic tension-type headache, not intractable; Insomnia; and Obesity affecting pregnancy, antepartum on their problem list.  Patient reports vulvar itching, itching of hands and feet mostly at night and pain in upper back, radiating down arms, neck pain and tension HA's. Was referred to PT but didn't get appt. On review of chart PT called but couldn't get through to pt x 2 and closed referral.  Contractions: Not present. Vag. Bleeding: None.  Movement: Present. Denies leaking of fluid.   The following portions of the patient's history were reviewed and updated as appropriate: allergies, current medications, past family history, past medical history, past social history, past surgical history and problem list.   Objective:   Vitals:   09/27/22 1120 09/27/22 1203  BP: (!) 127/90 136/80  Pulse: 91 96    Fetal Status: Fetal Heart Rate (bpm): 125   Movement: Present     General:  Alert, oriented and cooperative. Patient is in no acute distress.  Skin: Skin is warm and dry. No rash noted.    Cardiovascular: Normal heart rate noted  Respiratory: Normal respiratory effort, no problems with respiration noted  Abdomen: Soft, gravid, appropriate for gestational age.  Pain/Pressure: Present     Pelvic: Cervical exam deferred        Extremities: Normal range of motion.  Edema: Trace  Mental Status: Normal mood and affect. Normal behavior. Normal judgment and thought content.   Assessment and Plan:  Pregnancy: G3P2002 at [redacted]w[redacted]d 1. Pruritus of pregnancy in second trimester- - Hx Cholestasis w/ unusual presentation in previous pregnancy. MFM recommended starting Actigall even if labs are nml.   - Already getting serial growth US's  - Bile acids, total - Comp Met (CMET)  2. Supervision of high risk pregnancy in second trimester - May transfer care to [facility]. Went there last pregnancy but could go this time due to insurance and now they take her insurance. Pt also frustrated today to to communication problem at our office.   3. Obesity affecting pregnancy, antepartum, unspecified obesity type - Antenatal testing per MFM  4. History of cholestasis during pregnancy - Hx Cholestasis w/ unusual presentation in previous pregnancy. MFM recommended starting Actigall even if labs are nml.   - Already getting serial growth US's  - Bile acids, total - Comp Met (CMET)  5. Vulvar pruritus - Declines pelvic. Prefers self swab - Rx Triamcinolone  6. Chronic Hypertension in Pregnancy  - Mildly elevated today but repeat Nml. Pt upset as well.  - CBC, CMET - CTO closely.  Preterm labor symptoms and general obstetric precautions including but not limited to vaginal bleeding, contractions, leaking of fluid and fetal movement were reviewed in detail with the patient. Please refer to After Visit Summary for other counseling recommendations.  Return for Pt will decide if she needs to schedule ROB with [place] before transferring care..  Future Appointments  Date Time Provider Department  Center  10/05/2022 10:15 AM Donita Brooks, PT [facility] None  10/13/2022 12:30 PM [facility] NURSE [facility]  10/13/2022 12:45 PM [facility] US6 [facility]    Dorathy Kinsman, CNM

## 2022-09-27 NOTE — Progress Notes (Unsigned)
   PRENATAL VISIT NOTE  Subjective:  Carmen Wall is a 32 y.o. G3P2002 at [redacted]w[redacted]d being seen today for ongoing prenatal care.  She is currently monitored for the following issues for this high-risk pregnancy and has Rhinitis, allergic; Migraine without aura; Arthritis; Allergy; Depression; Chronic right-sided low back pain with right-sided sciatica; Acute pain of right knee; Obesity; GAD (generalized anxiety disorder); Jumper's knee of right side; Acute right ankle pain; Excessive daytime sleepiness; Cough variant asthma; Piriformis syndrome of right side; Insulin resistance; Vitamin D deficiency; Other hyperlipidemia; Backache; Neck pain; Radial styloid tenosynovitis; History of gestational hypertension; Myalgia; Iron deficiency anemia due to chronic blood loss; Primary hypertension; Supervision of high-risk pregnancy; History of cholestasis during pregnancy; Restless legs syndrome (RLS); Chronic tension-type headache, not intractable; Insomnia; and Obesity affecting pregnancy, antepartum on their problem list.  Patient reports {sx:14538}.   .  .   . Denies leaking of fluid.   The following portions of the patient's history were reviewed and updated as appropriate: allergies, current medications, past family history, past medical history, past social history, past surgical history and problem list.   Objective:  There were no vitals filed for this visit.  Fetal Status:           General:  Alert, oriented and cooperative. Patient is in no acute distress.  Skin: Skin is warm and dry. No rash noted.   Cardiovascular: Normal heart rate noted  Respiratory: Normal respiratory effort, no problems with respiration noted  Abdomen: Soft, gravid, appropriate for gestational age.        Pelvic: {Blank single:19197::"Cervical exam performed in the presence of a chaperone","Cervical exam deferred"}        Extremities: Normal range of motion.     Mental Status: Normal mood and affect. Normal  behavior. Normal judgment and thought content.   Assessment and Plan:  Pregnancy: G3P2002 at [redacted]w[redacted]d 1. [redacted] weeks gestation of pregnancy ***  2. Supervision of high risk pregnancy in second trimester ***  3. Obesity affecting pregnancy, antepartum, unspecified obesity type ***  4. Primary hypertension ***  5. History of cholestasis during pregnancy ***  {Blank single:19197::"Term","Preterm"} labor symptoms and general obstetric precautions including but not limited to vaginal bleeding, contractions, leaking of fluid and fetal movement were reviewed in detail with the patient. Please refer to After Visit Summary for other counseling recommendations.   Return in about 4 weeks (around 10/25/2022) for ROB.  Future Appointments  Date Time Provider Department Center  10/13/2022 12:30 PM [facility] NURSE [facility]  10/13/2022 12:45 PM [facility] US6 [facility]    Dorathy Kinsman, CNM

## 2022-09-28 LAB — COMPREHENSIVE METABOLIC PANEL
ALT: 11 IU/L (ref 0–32)
AST: 11 IU/L (ref 0–40)
Albumin/Globulin Ratio: 1.4 (ref 1.2–2.2)
Albumin: 3.6 g/dL — ABNORMAL LOW (ref 3.9–4.9)
Alkaline Phosphatase: 88 IU/L (ref 44–121)
BUN/Creatinine Ratio: 10 (ref 9–23)
BUN: 6 mg/dL (ref 6–20)
Bilirubin Total: 0.2 mg/dL (ref 0.0–1.2)
CO2: 19 mmol/L — ABNORMAL LOW (ref 20–29)
Calcium: 9 mg/dL (ref 8.7–10.2)
Chloride: 106 mmol/L (ref 96–106)
Creatinine, Ser: 0.6 mg/dL (ref 0.57–1.00)
Globulin, Total: 2.6 g/dL (ref 1.5–4.5)
Glucose: 77 mg/dL (ref 70–99)
Potassium: 4.3 mmol/L (ref 3.5–5.2)
Sodium: 140 mmol/L (ref 134–144)
Total Protein: 6.2 g/dL (ref 6.0–8.5)
eGFR: 121 mL/min/{1.73_m2} (ref >59)

## 2022-09-28 LAB — BILE ACIDS, TOTAL: Bile Acids Total: 2.2 umol/L (ref 0.0–10.0)

## 2022-09-28 LAB — CERVICOVAGINAL ANCILLARY ONLY
Bacterial Vaginitis (gardnerella): NEGATIVE
Candida Glabrata: NEGATIVE
Candida Vaginitis: POSITIVE — AB
Comment: NEGATIVE
Comment: NEGATIVE
Comment: NEGATIVE

## 2022-09-28 MED ORDER — TERCONAZOLE 0.4 % VA CREA: 1.0000 | TOPICAL_CREAM | Freq: Every day | VAGINAL | 1 refills | Status: DC

## 2022-09-28 NOTE — Addendum Note (Signed)
Addended by: Dorathy Kinsman on: 09/28/2022 01:19 PM   Modules accepted: Orders

## 2022-10-04 NOTE — Therapy (Signed)
OUTPATIENT PHYSICAL THERAPY THORACOLUMBAR EVALUATION   Patient Name: Carmen Wall MRN: [id number] DOB:1991/02/08, 32 y.o., female Today's Date: 10/04/2022  END OF SESSION:   Past Medical History:  Diagnosis Date   ADHD    Allergy    Anxiety    Anxiety    Arthritis    Back pain    Bilateral swelling of feet    Chronic fatigue syndrome    Constipation    Depression    IBS (irritable bowel syndrome)    Joint pain    Lactose intolerance    Migraine without aura    Past Surgical History:  Procedure Laterality Date   INTRAUTERINE DEVICE (IUD) INSERTION  04/2017   Mirena    KNEE ARTHROSCOPY Right    WISDOM TOOTH EXTRACTION     Patient Active Problem List   Diagnosis Date Noted   Supervision of high-risk pregnancy 07/04/2022   History of cholestasis during pregnancy 07/04/2022   Restless legs syndrome (RLS) 07/04/2022   Chronic tension-type headache, not intractable 07/04/2022   Insomnia 07/04/2022   Obesity affecting pregnancy, antepartum 07/04/2022   Primary hypertension 01/31/2022   Myalgia 11/10/2021   Iron deficiency anemia due to chronic blood loss 11/10/2021   History of gestational hypertension 07/22/2021   Backache 09/09/2020   Neck pain 09/09/2020   Radial styloid tenosynovitis 09/09/2020   Other hyperlipidemia 12/12/2019   Insulin resistance 11/25/2019   Vitamin D deficiency 11/25/2019   Piriformis syndrome of right side 11/11/2019   Cough variant asthma 09/26/2019   Excessive daytime sleepiness 03/13/2019   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   Acute right ankle pain 10/24/2018   Obesity 08/24/2018   GAD (generalized anxiety disorder) 08/24/2018   Acute pain of right knee 07/23/2018   Chronic right-sided low back pain with right-sided sciatica 05/15/2018   Depression 09/05/2017   Migraine without aura    Arthritis    Allergy    Rhinitis, allergic 09/09/2014    PCP: Ceasar Lund  REFERRING PROVIDER: Sharen Counter,  CNM  REFERRING DIAG: Arthralgia, unspecified joint; Cervicogenic Headache; Back pain affecting pregnancy in second trimester   Rationale for Evaluation and Treatment: Rehabilitation  THERAPY DIAG:  No diagnosis found.  ONSET DATE: ***  SUBJECTIVE:                                                                                                                                                                                           SUBJECTIVE STATEMENT: ***  PERTINENT HISTORY:  ***  PAIN:  Are you having pain? {OPRCPAIN:27236}  PRECAUTIONS: {Therapy precautions:24002}  WEIGHT BEARING RESTRICTIONS: {Yes ***/No:24003}  FALLS:  Has patient  fallen in last 6 months? {fallsyesno:27318}  LIVING ENVIRONMENT: Lives with: {OPRC lives with:25569::"lives with their family"} Lives in: {Lives in:25570} Stairs: {opstairs:27293} Has following equipment at home: {Assistive devices:23999}  OCCUPATION: ***  PLOF: {PLOF:24004}  PATIENT GOALS: ***  NEXT MD VISIT: ***  OBJECTIVE:   DIAGNOSTIC FINDINGS:  ***  PATIENT SURVEYS:  {rehab surveys:24030}  SCREENING FOR RED FLAGS: Bowel or bladder incontinence: {Yes/No:304960894} Spinal tumors: {Yes/No:304960894} Cauda equina syndrome: {Yes/No:304960894} Compression fracture: {Yes/No:304960894} Abdominal aneurysm: {Yes/No:304960894}  COGNITION: Overall cognitive status: {cognition:24006}     SENSATION: {sensation:27233}  MUSCLE LENGTH: Hamstrings: Right *** deg; Left *** deg Thomas test: Right *** deg; Left *** deg  POSTURE: {posture:25561}  PALPATION: ***  LUMBAR ROM:   AROM eval  Flexion   Extension   Right lateral flexion   Left lateral flexion   Right rotation   Left rotation    (Blank rows = not tested)  LOWER EXTREMITY ROM:     {AROM/PROM:27142}  Right eval Left eval  Hip flexion    Hip extension    Hip abduction    Hip adduction    Hip internal rotation    Hip external rotation    Knee flexion     Knee extension    Ankle dorsiflexion    Ankle plantarflexion    Ankle inversion    Ankle eversion     (Blank rows = not tested)  LOWER EXTREMITY MMT:    MMT Right eval Left eval  Hip flexion    Hip extension    Hip abduction    Hip adduction    Hip internal rotation    Hip external rotation    Knee flexion    Knee extension    Ankle dorsiflexion    Ankle plantarflexion    Ankle inversion    Ankle eversion     (Blank rows = not tested)  LUMBAR SPECIAL TESTS:  {lumbar special test:25242}  FUNCTIONAL TESTS:  {Functional tests:24029}  GAIT: Distance walked: *** Assistive device utilized: {Assistive devices:23999} Level of assistance: {Levels of assistance:24026} Comments: ***  TODAY'S TREATMENT:                                                                                                                              DATE: ***    PATIENT EDUCATION:  Education details: *** Person educated: {Person educated:25204} Education method: {Education Method:25205} Education comprehension: {Education Comprehension:25206}  HOME EXERCISE PROGRAM: ***  ASSESSMENT:  CLINICAL IMPRESSION: Patient is a *** y.o. *** who was seen today for physical therapy evaluation and treatment for ***.   OBJECTIVE IMPAIRMENTS: {opptimpairments:25111}.   ACTIVITY LIMITATIONS: {activitylimitations:27494}  PARTICIPATION LIMITATIONS: {participationrestrictions:25113}  PERSONAL FACTORS: {Personal factors:25162} are also affecting patient's functional outcome.   REHAB POTENTIAL: {rehabpotential:25112}  CLINICAL DECISION MAKING: {clinical decision making:25114}  EVALUATION COMPLEXITY: {Evaluation complexity:25115}   GOALS: Goals reviewed with patient? {yes/no:20286}  SHORT TERM GOALS: Target date: ***  *** Baseline: Goal status: {GOALSTATUS:25110}  2.  *** Baseline:  Goal status: {GOALSTATUS:25110}  3.  *** Baseline:  Goal status: {GOALSTATUS:25110}  4.  *** Baseline:   Goal status: {GOALSTATUS:25110}  5.  *** Baseline:  Goal status: {GOALSTATUS:25110}  6.  *** Baseline:  Goal status: {GOALSTATUS:25110}  LONG TERM GOALS: Target date: ***  *** Baseline:  Goal status: {GOALSTATUS:25110}  2.  *** Baseline:  Goal status: {GOALSTATUS:25110}  3.  *** Baseline:  Goal status: {GOALSTATUS:25110}  4.  *** Baseline:  Goal status: {GOALSTATUS:25110}  5.  *** Baseline:  Goal status: {GOALSTATUS:25110}  6.  *** Baseline:  Goal status: {GOALSTATUS:25110}  PLAN:  PT FREQUENCY: {rehab frequency:25116}  PT DURATION: {rehab duration:25117}  PLANNED INTERVENTIONS: {rehab planned interventions:25118::"Therapeutic exercises","Therapeutic activity","Neuromuscular re-education","Balance training","Gait training","Patient/Family education","Self Care","Joint mobilization"}.  PLAN FOR NEXT SESSION: Donita Brooks, PT 10/04/2022, 6:45 PM

## 2022-10-05 ENCOUNTER — Encounter: Payer: Self-pay | Admitting: Physical Therapy

## 2022-10-05 ENCOUNTER — Other Ambulatory Visit: Payer: Self-pay

## 2022-10-05 ENCOUNTER — Ambulatory Visit: Payer: Managed Care, Other (non HMO) | Attending: Advanced Practice Midwife | Admitting: Physical Therapy

## 2022-10-05 DIAGNOSIS — M62830 Muscle spasm of back: Secondary | ICD-10-CM | POA: Diagnosis present

## 2022-10-05 DIAGNOSIS — M542 Cervicalgia: Secondary | ICD-10-CM

## 2022-10-05 DIAGNOSIS — R262 Difficulty in walking, not elsewhere classified: Secondary | ICD-10-CM | POA: Diagnosis present

## 2022-10-05 DIAGNOSIS — R252 Cramp and spasm: Secondary | ICD-10-CM | POA: Insufficient documentation

## 2022-10-05 DIAGNOSIS — M2550 Pain in unspecified joint: Secondary | ICD-10-CM | POA: Diagnosis not present

## 2022-10-05 DIAGNOSIS — M6281 Muscle weakness (generalized): Secondary | ICD-10-CM | POA: Diagnosis present

## 2022-10-05 DIAGNOSIS — M549 Dorsalgia, unspecified: Secondary | ICD-10-CM | POA: Diagnosis not present

## 2022-10-05 DIAGNOSIS — O99891 Other specified diseases and conditions complicating pregnancy: Secondary | ICD-10-CM | POA: Insufficient documentation

## 2022-10-05 DIAGNOSIS — M79602 Pain in left arm: Secondary | ICD-10-CM | POA: Insufficient documentation

## 2022-10-05 DIAGNOSIS — G4486 Cervicogenic headache: Secondary | ICD-10-CM | POA: Insufficient documentation

## 2022-10-05 DIAGNOSIS — M79601 Pain in right arm: Secondary | ICD-10-CM

## 2022-10-12 ENCOUNTER — Encounter: Payer: Self-pay | Admitting: *Deleted

## 2022-10-13 ENCOUNTER — Ambulatory Visit (HOSPITAL_BASED_OUTPATIENT_CLINIC_OR_DEPARTMENT_OTHER): Payer: Managed Care, Other (non HMO)

## 2022-10-13 ENCOUNTER — Other Ambulatory Visit: Payer: Self-pay | Admitting: *Deleted

## 2022-10-13 ENCOUNTER — Ambulatory Visit: Payer: Managed Care, Other (non HMO) | Attending: Obstetrics | Admitting: *Deleted

## 2022-10-13 VITALS — BP 126/64 | HR 86

## 2022-10-13 DIAGNOSIS — O99891 Other specified diseases and conditions complicating pregnancy: Secondary | ICD-10-CM | POA: Diagnosis not present

## 2022-10-13 DIAGNOSIS — E669 Obesity, unspecified: Secondary | ICD-10-CM

## 2022-10-13 DIAGNOSIS — O0992 Supervision of high risk pregnancy, unspecified, second trimester: Secondary | ICD-10-CM

## 2022-10-13 DIAGNOSIS — Z3A26 26 weeks gestation of pregnancy: Secondary | ICD-10-CM

## 2022-10-13 DIAGNOSIS — Z362 Encounter for other antenatal screening follow-up: Secondary | ICD-10-CM | POA: Insufficient documentation

## 2022-10-13 DIAGNOSIS — O99212 Obesity complicating pregnancy, second trimester: Secondary | ICD-10-CM | POA: Insufficient documentation

## 2022-10-13 DIAGNOSIS — R030 Elevated blood-pressure reading, without diagnosis of hypertension: Secondary | ICD-10-CM | POA: Diagnosis not present

## 2022-10-13 DIAGNOSIS — O09892 Supervision of other high risk pregnancies, second trimester: Secondary | ICD-10-CM | POA: Diagnosis not present

## 2022-10-13 DIAGNOSIS — O09292 Supervision of pregnancy with other poor reproductive or obstetric history, second trimester: Secondary | ICD-10-CM | POA: Insufficient documentation

## 2022-10-14 ENCOUNTER — Ambulatory Visit: Payer: Managed Care, Other (non HMO)

## 2022-10-14 DIAGNOSIS — R262 Difficulty in walking, not elsewhere classified: Secondary | ICD-10-CM

## 2022-10-14 DIAGNOSIS — M542 Cervicalgia: Secondary | ICD-10-CM

## 2022-10-14 DIAGNOSIS — M62830 Muscle spasm of back: Secondary | ICD-10-CM

## 2022-10-14 DIAGNOSIS — M6281 Muscle weakness (generalized): Secondary | ICD-10-CM

## 2022-10-14 DIAGNOSIS — R252 Cramp and spasm: Secondary | ICD-10-CM

## 2022-10-14 DIAGNOSIS — M79602 Pain in left arm: Secondary | ICD-10-CM

## 2022-10-14 DIAGNOSIS — M79601 Pain in right arm: Secondary | ICD-10-CM

## 2022-10-14 NOTE — Therapy (Addendum)
OUTPATIENT PHYSICAL THERAPY CERVICAL EVALUATION PHYSICAL THERAPY DISCHARGE SUMMARY  Visits from Start of Care: 2  Current functional level related to goals / functional outcomes: See below   Remaining deficits: See below   Education / Equipment: See below   Patient agrees to discharge. Patient goals were not met. Patient is being discharged due to not returning since the last visit.    Patient Name: Carmen Wall MRN: [id number] DOB:July 07, 1990, 32 y.o., female Today's Date: 10/14/2022  END OF SESSION:  PT End of Session - 10/14/22 0805     Visit Number 2    Date for PT Re-Evaluation 11/16/22    Authorization Time Period 10/05/22 to 11/16/22    PT Start Time 0805    PT Stop Time 0845    PT Time Calculation (min) 40 min    Activity Tolerance Patient limited by pain    Behavior During Therapy [facility] for tasks assessed/performed             Past Medical History:  Diagnosis Date   Acute pain of right knee 07/23/2018   Acute right ankle pain 10/24/2018   ADHD    Allergy    Allergy    Anxiety    Anxiety    Arthritis    Arthritis    Back pain    Backache 09/09/2020   Bilateral swelling of feet    Chronic fatigue syndrome    Chronic right-sided low back pain with right-sided sciatica 05/15/2018   Chronic tension-type headache, not intractable 07/04/2022   Constipation    Cough variant asthma 09/26/2019   Depression    Depression 09/05/2017   Excessive daytime sleepiness 03/13/2019   GAD (generalized anxiety disorder) 08/24/2018   IBS (irritable bowel syndrome)    Insomnia 07/04/2022   Joint pain    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   Lactose intolerance    Migraine without aura    Migraine without aura    Neck pain 09/09/2020   Other hyperlipidemia 12/12/2019   Piriformis syndrome of right side 11/11/2019   Radial styloid tenosynovitis 09/09/2020   Restless legs syndrome (RLS) 07/04/2022   Rhinitis, allergic 09/09/2014   Vitamin D  deficiency 11/25/2019   Past Surgical History:  Procedure Laterality Date   INTRAUTERINE DEVICE (IUD) INSERTION  04/2017   Mirena    KNEE ARTHROSCOPY Right    WISDOM TOOTH EXTRACTION     Patient Active Problem List   Diagnosis Date Noted   Supervision of high-risk pregnancy 07/04/2022   History of cholestasis during pregnancy 07/04/2022   Obesity affecting pregnancy, antepartum 07/04/2022   Primary hypertension 01/31/2022   Myalgia 11/10/2021   Iron deficiency anemia due to chronic blood loss 11/10/2021   History of gestational hypertension 07/22/2021   Insulin resistance 11/25/2019   Obesity 08/24/2018    PCP: Alysia Penna, NP  REFERRING PROVIDER: Sharen Counter  REFERRING DIAG: Arthralgia, unspecified joint; Cervicogenic headache  THERAPY DIAG:  Cervicalgia  Muscle spasm of back  Pain in right arm  Pain in left arm  Muscle weakness (generalized)  Cramp and spasm  Difficulty in walking, not elsewhere classified  Rationale for Evaluation and Treatment: Rehabilitation  ONSET DATE: ~3 years ago  SUBJECTIVE:  SUBJECTIVE STATEMENT: Patient states she had minimal relief with the stretches.  Both arms become numb or tingling but left UE is more intense.   Hand dominance: Right  PERTINENT HISTORY:  migraines  PAIN:  10/14/22: Are you having pain? Yes: NPRS scale: 6/10 Pain location: upper trap region and lateral elbow into forearm and last 3 digits Pain description: tightness and medial forearm Aggravating factors: using the arms, driving, sleeping, most activity involving her arms Relieving factors: massage gun to shoulders only temporary  PRECAUTIONS: None  WEIGHT BEARING RESTRICTIONS: No  FALLS:  Has patient fallen in last 6 months? No  LIVING  ENVIRONMENT: Lives with: lives with their family Lives in: House/apartment Stairs: No Has following equipment at home: None  OCCUPATION: officer   PLOF: Independent  PATIENT GOALS: decrease numbness/pain  NEXT MD VISIT: may be transferring to another OB  OBJECTIVE:   DIAGNOSTIC FINDINGS:  none  PATIENT SURVEYS:    COGNITION: Overall cognitive status: Within functional limits for tasks assessed  SENSATION: Pt reports N/T from medial elbow down to the hand/fingers, Left worse than Rt   POSTURE: rounded shoulders, forward head, and decreased thoracic kyphosis  PALPATION: Muscle tension and tenderness noted bilateral traps, levator scap, rhomboids   CERVICAL ROM:   Active ROM A/PROM (deg) eval  Flexion WNL  Extension WNL  Right lateral flexion WNL  Left lateral flexion WNL  Right rotation 70 (+) tightness Lt side  Left rotation 80   (Blank rows = not tested)  UPPER EXTREMITY ROM:  Active ROM Right eval Left eval  Shoulder flexion    Shoulder extension    Shoulder abduction    Shoulder adduction    Shoulder extension    Shoulder internal rotation    Shoulder external rotation    Elbow flexion    Elbow extension    Wrist flexion    Wrist extension    Wrist ulnar deviation    Wrist radial deviation    Wrist pronation    Wrist supination     (Blank rows = not tested)  UPPER EXTREMITY MMT:  MMT Right eval Left eval  Shoulder flexion 5 5  Shoulder extension 5 5  Shoulder abduction 5 5  Shoulder adduction    Shoulder extension    Shoulder internal rotation 5 5  Shoulder external rotation 5 5  Middle trapezius    Lower trapezius    Elbow flexion    Elbow extension    Wrist flexion 5 5  Wrist extension 5 5  Wrist ulnar deviation    Wrist radial deviation    Wrist pronation 5 5  Wrist supination 5 5  Grip strength     (Blank rows = not tested)  CERVICAL SPECIAL TESTS:  (+) ULTT for ulnar nerve bilaterally  FUNCTIONAL TESTS:     TODAY'S TREATMENT:  DATE:  10/14/22 UBE x 6 min (3/3) Tband postural exercises: shoulder ext, rows, bilateral shoulder ER, bilateral shoulder horizontal abduction (yellow 2x10 each) Assigned to HEP as well.   Trigger Point Dry-Needling  Treatment instructions: Expect mild to moderate muscle soreness. S/S of pneumothorax if dry needled over a lung field, and to seek immediate medical attention should they occur. Patient verbalized understanding of these instructions and education. Patient Consent Given: Yes Education handout provided: Yes Muscles treated: bilateral upper traps Electrical stimulation performed: No Parameters: N/A Treatment response/outcome: Skilled palpation used to identify taut bands and trigger points.  Once identified, dry needling techniques used to treat these areas.  Heavy twitch response ellicited along with palpable elongation of muscle.  During needling patient became slightly light headed.  Had to instruct patient to breath slowly and we took several breaks to allow her to relax.  We did not do other muscle groups due to her apprehension today.    DATE:  10/05/22 Sidelying thoracic rotation x5 reps each side  Mild thoracic flexion with posterior rib expansion Pec stretch in doorway, gentle. This increased numbness in forearms/hands Standing snow angels against wall for chest expansion- increase in symptoms BUE Seated ulnar nerve flossing Lt and Rt x5 reps HEP demo  PATIENT EDUCATION:  Education details: eval findings/POC; implemented HEP Person educated: Patient Education method: [employer] Technology [employer] Education comprehension: verbalized understanding and returned demonstration  HOME EXERCISE PROGRAM: Access Code: [email or web address] URL: [email or web address] Date: 10/14/2022 Prepared by: Carmen Wall  Exercises - Seated  Thoracic Flexion and Rotation with Arms Crossed  - 1 x daily - 7 x weekly - 3 sets - 10 reps - Ulnar Nerve Flossing  - 3 x daily - 7 x weekly - 5 reps - Shoulder extension with resistance - Neutral  - 2 x daily - 7 x weekly - 2 sets - 10 reps - Standing Shoulder Row with Anchored Resistance  - 2 x daily - 7 x weekly - 2 sets - 10 reps - Shoulder External Rotation and Scapular Retraction with Resistance  - 2 x daily - 7 x weekly - 2 sets - 10 reps - Standing Shoulder Horizontal Abduction with Resistance  - 2 x daily - 7 x weekly - 2 sets - 10 reps  ASSESSMENT:  CLINICAL IMPRESSION: Carmen Wall had some mild relief with her HEP but states her arms and hands still go numb.  She had numbness throughout all exercises and UBE today.  We attempted dry needling in supine due to pregnancy.  Very strong twitch responses elicited in both upper traps but she became nervous and light headed due to the intense cramping and twitch responses. We had to take several breaks but she wanted to try and complete the upper traps.  We did not do any further muscle groups today due to her apprehension.   She would benefit from skilled PT to address her postural restrictions, improve mechanics with daily activity at home/work, decrease muscle tension and improve her pain/numbness.  OBJECTIVE IMPAIRMENTS: decreased activity tolerance, decreased ROM, decreased strength, increased muscle spasms, impaired flexibility, impaired sensation, improper body mechanics, postural dysfunction, and pain.   ACTIVITY LIMITATIONS: carrying, lifting, dressing, reach over head, and caring for others  PARTICIPATION LIMITATIONS:  not limited, but present during most activities of home and work  PERSONAL FACTORS: Time since onset of injury/illness/exacerbation and 1 comorbidity: chronic migraines  are also affecting patient's functional outcome.   REHAB POTENTIAL: Good  CLINICAL DECISION MAKING: Stable/uncomplicated  EVALUATION COMPLEXITY:  Low   GOALS: Goals reviewed with patient? Yes  SHORT TERM GOALS: Target date: 10/12/22  Pt will be independent with initial HEP to decrease pain. Baseline:  Goal status: INITIAL   LONG TERM GOALS: Target date: 11/16/22  Pt will report atleast 50% improvement in UE symptoms from the start of PT. Baseline:  Goal status: INITIAL  2.  Pt will have improved thoracic flexion and ability to demonstrate proper posterior rib expansion without the need for PT cuing. Baseline:  Goal status: INITIAL  3.  Pt will be able to verbalize proper sleep positions to decrease pressure on the shoulders and decrease symptoms at night. Baseline:  Goal status: INITIAL  4.  Pt will be independent with an advanced HEP to maintain improved posture, strength and flexibility after discharge from PT. Baseline:  Goal status: INITIAL     PLAN:  PT FREQUENCY: 2x/week  PT DURATION: 6 weeks  PLANNED INTERVENTIONS: Therapeutic exercises, Therapeutic activity, Neuromuscular re-education, Patient/Family education, Self Care, Joint mobilization, Dry Needling, Spinal manipulation, Spinal mobilization, Cryotherapy, Moist heat, Taping, Manual therapy, and Re-evaluation  PLAN FOR NEXT SESSION: Assess response to D/N.  If patient agrees, try upper traps again,levator scap, upper thoracic region for improved mobility; manual as needed to decrease muscle tension around the neck; f/u on nerve flossing and update if needed; scap strengthening, Grip strength test.    Patient called to say she will not be able to continue due to financial reasons.  Would like to DC PT.   Carmen Wall, PT 11/03/22 10:47 AM   Carmen Wall, PT 10/14/22 9:32 AM  [facility] [address] [place], [place] [address] Phone # [phone] Fax [phone]

## 2022-10-19 ENCOUNTER — Ambulatory Visit: Payer: Managed Care, Other (non HMO)

## 2022-10-25 ENCOUNTER — Ambulatory Visit: Payer: Managed Care, Other (non HMO)

## 2022-10-28 ENCOUNTER — Ambulatory Visit: Payer: Managed Care, Other (non HMO)

## 2022-11-02 ENCOUNTER — Ambulatory Visit: Payer: Managed Care, Other (non HMO) | Admitting: Physical Therapy

## 2022-11-17 ENCOUNTER — Inpatient Hospital Stay (HOSPITAL_COMMUNITY)
Admission: AD | Admit: 2022-11-17 | Discharge: 2022-11-18 | Disposition: A | Discharge status: Home / Self Care | Payer: Managed Care, Other (non HMO) | Attending: Obstetrics and Gynecology | Admitting: Obstetrics and Gynecology

## 2022-11-17 ENCOUNTER — Encounter (HOSPITAL_COMMUNITY): Payer: Self-pay | Admitting: Obstetrics and Gynecology

## 2022-11-17 DIAGNOSIS — O26893 Other specified pregnancy related conditions, third trimester: Secondary | ICD-10-CM | POA: Insufficient documentation

## 2022-11-17 DIAGNOSIS — Z3A31 31 weeks gestation of pregnancy: Secondary | ICD-10-CM | POA: Diagnosis not present

## 2022-11-17 DIAGNOSIS — O10013 Pre-existing essential hypertension complicating pregnancy, third trimester: Secondary | ICD-10-CM | POA: Insufficient documentation

## 2022-11-17 DIAGNOSIS — R102 Pelvic and perineal pain: Secondary | ICD-10-CM | POA: Diagnosis present

## 2022-11-17 LAB — URINALYSIS, ROUTINE W REFLEX MICROSCOPIC
Bilirubin Urine: NEGATIVE
Glucose, UA: NEGATIVE mg/dL
Hgb urine dipstick: NEGATIVE
Ketones, ur: NEGATIVE mg/dL
Leukocytes,Ua: NEGATIVE
Nitrite: NEGATIVE
Protein, ur: NEGATIVE mg/dL
Specific Gravity, Urine: >1.03 — ABNORMAL HIGH (ref 1.005–1.030)
pH: 6 (ref 5.0–8.0)

## 2022-11-17 NOTE — MAU Provider Note (Addendum)
History     CSN: [id number]  Arrival date and time: 11/17/22 2200   Event Date/Time   First Provider Initiated Contact with Patient 11/17/22 2248      Chief Complaint  Patient presents with   vaginal pressure   HPI  Carmen Wall is a 32 y.o. G3P2002 at [redacted]w[redacted]d who presents for evaluation of pelvic pressure. Patient reports she is having increased pressure in her lower abdomen and pressure today. She reports she was in the shower and felt a bulge in her vagina and got worried. She reports she bulge is not painful. She denies any pain at this time.  She denies any vaginal bleeding, discharge, and leaking of fluid. Denies any constipation, diarrhea or any urinary complaints. Reports normal fetal movement. She reports she is eating regularly but not drinking much water.  Initial BP in MAU elevated. Patient has CHTN, no medication. Denies any HA, visual changes or epigastric pain.  OB History     Gravida  3   Para  2   Term  2   Preterm      AB      Living  2      SAB      IAB      Ectopic      Multiple  0   Live Births  2           Past Medical History:  Diagnosis Date   Acute pain of right knee 07/23/2018   Acute right ankle pain 10/24/2018   ADHD    Allergy    Allergy    Anxiety    Anxiety    Arthritis    Arthritis    Back pain    Backache 09/09/2020   Bilateral swelling of feet    Chronic fatigue syndrome    Chronic right-sided low back pain with right-sided sciatica 05/15/2018   Chronic tension-type headache, not intractable 07/04/2022   Constipation    Cough variant asthma 09/26/2019   Depression    Depression 09/05/2017   Excessive daytime sleepiness 03/13/2019   GAD (generalized anxiety disorder) 08/24/2018   IBS (irritable bowel syndrome)    Insomnia 07/04/2022   Joint pain    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   Lactose intolerance    Migraine without aura    Migraine without aura    Neck pain 09/09/2020    Other hyperlipidemia 12/12/2019   Piriformis syndrome of right side 11/11/2019   Radial styloid tenosynovitis 09/09/2020   Restless legs syndrome (RLS) 07/04/2022   Rhinitis, allergic 09/09/2014   Vitamin D deficiency 11/25/2019    Past Surgical History:  Procedure Laterality Date   INTRAUTERINE DEVICE (IUD) INSERTION  04/2017   Mirena    KNEE ARTHROSCOPY Right    WISDOM TOOTH EXTRACTION      Family History  Problem Relation Age of Onset   Diabetes Father    Arthritis Father    Depression Father    Mental illness Father        PTSD, anxiety   Hypertension Father    Hyperlipidemia Father    Anxiety disorder Father    Bipolar disorder Father    Sleep apnea Father    Alcoholism Father    Obesity Father    Arthritis Mother        osteoartthritis   Fibroids Mother    Ovarian cysts Mother    Obesity Mother    Kidney disease Maternal Grandmother    Arthritis Maternal Grandmother  Prostate cancer Maternal Grandfather    Arthritis Maternal Grandfather    Arthritis Paternal Grandmother    Diabetes Paternal Grandfather    Mental illness Paternal Grandfather    Arthritis Paternal Grandfather     Social History   Tobacco Use   Smoking status: Never   Smokeless tobacco: Never  Vaping Use   Vaping Use: Never used  Substance Use Topics   Alcohol use: No    Alcohol/week: 0.0 standard drinks of alcohol   Drug use: No    Allergies: No Known Allergies  Facility-Administered Medications Prior to Admission  Medication Dose Route Frequency Provider Last Rate Last Admin   triamcinolone cream (KENALOG) 0.1 % cream   Topical TID Katrinka Blazing, [place], CNM       Medications Prior to Admission  Medication Sig Dispense Refill Last Dose   aspirin EC 81 MG tablet Take 1 tablet (81 mg total) by mouth daily. Swallow whole. 30 tablet 5 11/17/2022   Prenatal Vit-Fe Fumarate-FA (PREPLUS) 27-1 MG TABS Take 1 tablet by mouth daily.   11/17/2022   albuterol (VENTOLIN HFA) 108 (90 Base) MCG/ACT  inhaler Inhale 1 puff into the lungs every 6 (six) hours as needed for wheezing or shortness of breath. (Patient not taking: Reported on 10/13/2022) 8 g 0    amitriptyline (ELAVIL) 10 MG tablet Take 1 tablet (10 mg total) by mouth at bedtime. (Patient not taking: Reported on 08/30/2022) 30 tablet 5    cyclobenzaprine (FLEXERIL) 5 MG tablet Take 1-2 tablets (5-10 mg total) by mouth 3 (three) times daily as needed for muscle spasms. (Patient not taking: Reported on 08/30/2022) 30 tablet 1    ursodiol (ACTIGALL) 300 MG capsule Take 1 capsule (300 mg total) by mouth 2 (two) times daily. (Patient not taking: Reported on 10/13/2022) 60 capsule 4     Review of Systems  Constitutional: Negative.  Negative for fatigue and fever.  HENT: Negative.    Respiratory: Negative.  Negative for shortness of breath.   Cardiovascular: Negative.  Negative for chest pain.  Gastrointestinal: Negative.  Negative for abdominal pain, constipation, diarrhea, nausea and vomiting.  Genitourinary:  Positive for pelvic pain. Negative for dysuria, vaginal bleeding and vaginal discharge.  Neurological: Negative.  Negative for dizziness and headaches.   Physical Exam   Blood pressure 125/71, pulse 96, temperature 97.9 F (36.6 C), resp. rate 17, height 5\' 7"  (1.702 m), weight (!) 148.8 kg, last menstrual period 04/09/2022, SpO2 98 %, not currently breastfeeding.  Patient Vitals for the past 24 hrs:  BP Temp Pulse Resp SpO2 Height Weight  11/18/22 0006 128/71 -- 86 -- -- -- --  11/17/22 2300 126/70 -- 88 -- 97 % -- --  11/17/22 2246 125/71 -- 96 -- -- -- --  11/17/22 2245 -- -- -- -- 98 % -- --  11/17/22 2218 (!) 153/80 97.9 F (36.6 C) 85 17 98 % 5\' 7"  (1.702 m) (!) 148.8 kg    Physical Exam Vitals and nursing note reviewed.  Constitutional:      General: She is not in acute distress.    Appearance: She is well-developed.  HENT:     Head: Normocephalic.  Eyes:     Pupils: Pupils are equal, round, and reactive to light.   Cardiovascular:     Rate and Rhythm: Normal rate and regular rhythm.     Heart sounds: Normal heart sounds.  Pulmonary:     Effort: Pulmonary effort is normal. No respiratory distress.     Breath sounds:  Normal breath sounds.  Abdominal:     General: Bowel sounds are normal. There is no distension.     Palpations: Abdomen is soft.     Tenderness: There is no abdominal tenderness.  Genitourinary:    Comments: Slight prolapse of anterior vaginal wall noted Skin:    General: Skin is warm and dry.  Neurological:     Mental Status: She is alert and oriented to person, place, and time.  Psychiatric:        Mood and Affect: Mood normal.        Behavior: Behavior normal.        Thought Content: Thought content normal.        Judgment: Judgment normal.     Fetal Tracing:  Baseline: 130 Variability: moderate Accels: 15x15 Decels: none  Toco: none  Cervix: closed/thick/posterior   MAU Course  Procedures  Results for orders placed or performed during the hospital encounter of 11/17/22 (from the past 24 hour(s))  Urinalysis, Routine w reflex microscopic -Urine, Clean Catch     Status: Abnormal   Collection Time: 11/17/22 11:18 PM  Result Value Ref Range   Color, Urine YELLOW YELLOW   APPearance CLEAR CLEAR   Specific Gravity, Urine >1.030 (H) 1.005 - 1.030   pH 6.0 5.0 - 8.0   Glucose, UA NEGATIVE NEGATIVE mg/dL   Hgb urine dipstick NEGATIVE NEGATIVE   Bilirubin Urine NEGATIVE NEGATIVE   Ketones, ur NEGATIVE NEGATIVE mg/dL   Protein, ur NEGATIVE NEGATIVE mg/dL   Nitrite NEGATIVE NEGATIVE   Leukocytes,Ua NEGATIVE NEGATIVE   MDM Labs ordered and reviewed.   UA Reassurance provided of normalcy of exam. Discussed importance of PO hydration.  Assessment and Plan   1. Pelvic pain affecting pregnancy in third trimester, antepartum   2. [redacted] weeks gestation of pregnancy     -Discharge home in stable condition -Third trimester precautions discussed -Patient advised to  follow-up with OB as scheduled for prenatal care -Patient may return to MAU as needed or if her condition were to change or worsen  Rolm Bookbinder, CNM 11/17/2022, 10:48 PM

## 2022-11-17 NOTE — MAU Note (Signed)
.  Carmen Wall is a 32 y.o. at [redacted]w[redacted]d here in MAU reporting having a lot of pelvic pressure tonight. Was in the shower and felt at vaginal area and felt something soft like size of golf ball around vaginal area. Denies LOF or VB. Reports good FM. Some mild back pain  Onset of complaint: tonight Pain score: 5 Vitals:   11/17/22 2218  BP: (!) 153/80  Pulse: 85  Resp: 17  Temp: 97.9 F (36.6 C)  SpO2: 98%     FHT:163 Lab orders placed from triage:  u/a

## 2022-11-18 DIAGNOSIS — Z3A31 31 weeks gestation of pregnancy: Secondary | ICD-10-CM | POA: Diagnosis not present

## 2022-11-18 DIAGNOSIS — R102 Pelvic and perineal pain: Secondary | ICD-10-CM

## 2022-11-18 DIAGNOSIS — O26893 Other specified pregnancy related conditions, third trimester: Secondary | ICD-10-CM

## 2022-11-18 NOTE — Discharge Instructions (Signed)

## 2022-11-18 NOTE — Progress Notes (Signed)
Written and verbal d/c instructions given and understanding voiced. 

## 2022-11-22 ENCOUNTER — Ambulatory Visit: Payer: Managed Care, Other (non HMO)

## 2022-11-22 ENCOUNTER — Ambulatory Visit: Payer: Managed Care, Other (non HMO) | Attending: Obstetrics and Gynecology

## 2022-11-22 DIAGNOSIS — O99213 Obesity complicating pregnancy, third trimester: Secondary | ICD-10-CM | POA: Diagnosis not present

## 2022-11-22 DIAGNOSIS — Z3A32 32 weeks gestation of pregnancy: Secondary | ICD-10-CM

## 2022-11-22 DIAGNOSIS — O99212 Obesity complicating pregnancy, second trimester: Secondary | ICD-10-CM | POA: Insufficient documentation

## 2022-11-22 DIAGNOSIS — O133 Gestational [pregnancy-induced] hypertension without significant proteinuria, third trimester: Secondary | ICD-10-CM | POA: Diagnosis not present

## 2022-11-22 DIAGNOSIS — E669 Obesity, unspecified: Secondary | ICD-10-CM | POA: Diagnosis not present

## 2022-11-22 DIAGNOSIS — O09293 Supervision of pregnancy with other poor reproductive or obstetric history, third trimester: Secondary | ICD-10-CM | POA: Diagnosis not present

## 2022-12-09 ENCOUNTER — Ambulatory Visit: Payer: Managed Care, Other (non HMO) | Attending: Obstetrics and Gynecology

## 2022-12-09 ENCOUNTER — Ambulatory Visit: Payer: Managed Care, Other (non HMO)

## 2022-12-09 DIAGNOSIS — E88819 Insulin resistance, unspecified: Secondary | ICD-10-CM

## 2022-12-09 DIAGNOSIS — O09293 Supervision of pregnancy with other poor reproductive or obstetric history, third trimester: Secondary | ICD-10-CM | POA: Diagnosis not present

## 2022-12-09 DIAGNOSIS — O99283 Endocrine, nutritional and metabolic diseases complicating pregnancy, third trimester: Secondary | ICD-10-CM | POA: Diagnosis not present

## 2022-12-09 DIAGNOSIS — O99213 Obesity complicating pregnancy, third trimester: Secondary | ICD-10-CM

## 2022-12-09 DIAGNOSIS — Z8719 Personal history of other diseases of the digestive system: Secondary | ICD-10-CM

## 2022-12-09 DIAGNOSIS — E669 Obesity, unspecified: Secondary | ICD-10-CM

## 2022-12-09 DIAGNOSIS — Z3A34 34 weeks gestation of pregnancy: Secondary | ICD-10-CM

## 2022-12-09 DIAGNOSIS — O99891 Other specified diseases and conditions complicating pregnancy: Secondary | ICD-10-CM | POA: Diagnosis not present

## 2022-12-09 DIAGNOSIS — O99212 Obesity complicating pregnancy, second trimester: Secondary | ICD-10-CM | POA: Diagnosis not present

## 2022-12-09 DIAGNOSIS — R030 Elevated blood-pressure reading, without diagnosis of hypertension: Secondary | ICD-10-CM | POA: Diagnosis not present

## 2022-12-16 ENCOUNTER — Ambulatory Visit: Payer: Managed Care, Other (non HMO)

## 2022-12-16 ENCOUNTER — Ambulatory Visit: Payer: Managed Care, Other (non HMO) | Attending: Obstetrics and Gynecology

## 2022-12-16 DIAGNOSIS — O99212 Obesity complicating pregnancy, second trimester: Secondary | ICD-10-CM | POA: Insufficient documentation

## 2022-12-16 DIAGNOSIS — E669 Obesity, unspecified: Secondary | ICD-10-CM

## 2022-12-16 DIAGNOSIS — O09293 Supervision of pregnancy with other poor reproductive or obstetric history, third trimester: Secondary | ICD-10-CM

## 2022-12-16 DIAGNOSIS — O99213 Obesity complicating pregnancy, third trimester: Secondary | ICD-10-CM

## 2022-12-16 DIAGNOSIS — Z3A35 35 weeks gestation of pregnancy: Secondary | ICD-10-CM | POA: Diagnosis not present

## 2022-12-23 ENCOUNTER — Ambulatory Visit: Payer: Managed Care, Other (non HMO) | Attending: Obstetrics and Gynecology

## 2022-12-23 ENCOUNTER — Ambulatory Visit: Payer: Managed Care, Other (non HMO)

## 2022-12-23 DIAGNOSIS — O99212 Obesity complicating pregnancy, second trimester: Secondary | ICD-10-CM | POA: Diagnosis not present

## 2022-12-23 DIAGNOSIS — O99213 Obesity complicating pregnancy, third trimester: Secondary | ICD-10-CM

## 2022-12-23 DIAGNOSIS — O09293 Supervision of pregnancy with other poor reproductive or obstetric history, third trimester: Secondary | ICD-10-CM | POA: Diagnosis not present

## 2022-12-23 DIAGNOSIS — Z3A36 36 weeks gestation of pregnancy: Secondary | ICD-10-CM

## 2022-12-23 DIAGNOSIS — E669 Obesity, unspecified: Secondary | ICD-10-CM | POA: Diagnosis not present

## 2022-12-28 ENCOUNTER — Encounter (HOSPITAL_COMMUNITY): Payer: Self-pay | Admitting: Obstetrics and Gynecology

## 2022-12-28 ENCOUNTER — Inpatient Hospital Stay (HOSPITAL_COMMUNITY): Payer: Managed Care, Other (non HMO)

## 2022-12-28 ENCOUNTER — Inpatient Hospital Stay (HOSPITAL_COMMUNITY)
Admission: AD | Admit: 2022-12-28 | Discharge: 2022-12-31 | DRG: 805 | Disposition: A | Discharge status: Home / Self Care | Payer: Managed Care, Other (non HMO) | Attending: Obstetrics and Gynecology | Admitting: Obstetrics and Gynecology

## 2022-12-28 ENCOUNTER — Other Ambulatory Visit: Payer: Self-pay

## 2022-12-28 DIAGNOSIS — Z7982 Long term (current) use of aspirin: Secondary | ICD-10-CM

## 2022-12-28 DIAGNOSIS — R030 Elevated blood-pressure reading, without diagnosis of hypertension: Secondary | ICD-10-CM | POA: Diagnosis not present

## 2022-12-28 DIAGNOSIS — O9902 Anemia complicating childbirth: Secondary | ICD-10-CM | POA: Diagnosis present

## 2022-12-28 DIAGNOSIS — R0600 Dyspnea, unspecified: Secondary | ICD-10-CM | POA: Diagnosis present

## 2022-12-28 DIAGNOSIS — M79602 Pain in left arm: Secondary | ICD-10-CM

## 2022-12-28 DIAGNOSIS — M79603 Pain in arm, unspecified: Secondary | ICD-10-CM | POA: Diagnosis present

## 2022-12-28 DIAGNOSIS — R0602 Shortness of breath: Secondary | ICD-10-CM | POA: Diagnosis not present

## 2022-12-28 DIAGNOSIS — O99891 Other specified diseases and conditions complicating pregnancy: Secondary | ICD-10-CM

## 2022-12-28 DIAGNOSIS — D509 Iron deficiency anemia, unspecified: Secondary | ICD-10-CM | POA: Diagnosis present

## 2022-12-28 DIAGNOSIS — I10 Essential (primary) hypertension: Secondary | ICD-10-CM | POA: Diagnosis present

## 2022-12-28 DIAGNOSIS — O1092 Unspecified pre-existing hypertension complicating childbirth: Secondary | ICD-10-CM | POA: Diagnosis not present

## 2022-12-28 DIAGNOSIS — Z3A37 37 weeks gestation of pregnancy: Secondary | ICD-10-CM

## 2022-12-28 DIAGNOSIS — O10919 Unspecified pre-existing hypertension complicating pregnancy, unspecified trimester: Secondary | ICD-10-CM | POA: Diagnosis present

## 2022-12-28 DIAGNOSIS — O99214 Obesity complicating childbirth: Secondary | ICD-10-CM | POA: Diagnosis present

## 2022-12-28 DIAGNOSIS — O26893 Other specified pregnancy related conditions, third trimester: Secondary | ICD-10-CM | POA: Diagnosis present

## 2022-12-28 DIAGNOSIS — O4593 Premature separation of placenta, unspecified, third trimester: Secondary | ICD-10-CM | POA: Diagnosis present

## 2022-12-28 DIAGNOSIS — R200 Anesthesia of skin: Secondary | ICD-10-CM | POA: Diagnosis present

## 2022-12-28 LAB — BRAIN NATRIURETIC PEPTIDE: B Natriuretic Peptide: 69.2 pg/mL (ref 0.0–100.0)

## 2022-12-28 LAB — COMPREHENSIVE METABOLIC PANEL
ALT: 61 U/L — ABNORMAL HIGH (ref 0–44)
AST: 54 U/L — ABNORMAL HIGH (ref 15–41)
Albumin: 2.5 g/dL — ABNORMAL LOW (ref 3.5–5.0)
Alkaline Phosphatase: 159 U/L — ABNORMAL HIGH (ref 38–126)
Anion gap: 13 (ref 5–15)
BUN: 7 mg/dL (ref 6–20)
CO2: 22 mmol/L (ref 22–32)
Calcium: 9 mg/dL (ref 8.9–10.3)
Chloride: 103 mmol/L (ref 98–111)
Creatinine, Ser: 0.56 mg/dL (ref 0.44–1.00)
GFR, Estimated: >60 mL/min (ref >60)
Glucose, Bld: 73 mg/dL (ref 70–99)
Potassium: 4.1 mmol/L (ref 3.5–5.1)
Sodium: 138 mmol/L (ref 135–145)
Total Bilirubin: 0.8 mg/dL (ref 0.3–1.2)
Total Protein: 6 g/dL — ABNORMAL LOW (ref 6.5–8.1)

## 2022-12-28 LAB — URINALYSIS, ROUTINE W REFLEX MICROSCOPIC
Bilirubin Urine: NEGATIVE
Glucose, UA: NEGATIVE mg/dL
Hgb urine dipstick: NEGATIVE
Ketones, ur: NEGATIVE mg/dL
Leukocytes,Ua: NEGATIVE
Nitrite: NEGATIVE
Protein, ur: NEGATIVE mg/dL
Specific Gravity, Urine: 1.017 (ref 1.005–1.030)
pH: 6 (ref 5.0–8.0)

## 2022-12-28 LAB — CBC
HCT: 38.4 % (ref 36.0–46.0)
Hemoglobin: 13.1 g/dL (ref 12.0–15.0)
MCH: 30.6 pg (ref 26.0–34.0)
MCHC: 34.1 g/dL (ref 30.0–36.0)
MCV: 89.7 fL (ref 80.0–100.0)
Platelets: 141 10*3/uL — ABNORMAL LOW (ref 150–400)
RBC: 4.28 MIL/uL (ref 3.87–5.11)
RDW: 14.1 % (ref 11.5–15.5)
WBC: 6.6 10*3/uL (ref 4.0–10.5)
nRBC: 0 % (ref 0.0–0.2)

## 2022-12-28 LAB — TYPE AND SCREEN
ABO/RH(D): A POS
Antibody Screen: NEGATIVE

## 2022-12-28 LAB — PROTEIN / CREATININE RATIO, URINE
Creatinine, Urine: 81 mg/dL
Protein Creatinine Ratio: 0.16 mg/mg{Cre} — ABNORMAL HIGH (ref 0.00–0.15)
Total Protein, Urine: 13 mg/dL

## 2022-12-28 LAB — TROPONIN I (HIGH SENSITIVITY): Troponin I (High Sensitivity): 6 ng/L (ref <18)

## 2022-12-28 MED ORDER — DOCUSATE SODIUM 100 MG PO CAPS: 100.0000 mg | ORAL_CAPSULE | Freq: Every day | ORAL | Status: DC

## 2022-12-28 MED ORDER — CALCIUM CARBONATE ANTACID 500 MG PO CHEW: 2.0000 | CHEWABLE_TABLET | ORAL | Status: DC | PRN

## 2022-12-28 MED ORDER — PRENATAL MULTIVITAMIN CH: 1.0000 | ORAL_TABLET | Freq: Every day | ORAL | Status: DC

## 2022-12-28 MED ORDER — LACTATED RINGERS IV SOLN: INTRAVENOUS | Status: DC

## 2022-12-28 MED ORDER — ACETAMINOPHEN 325 MG PO TABS: 650.0000 mg | ORAL_TABLET | ORAL | Status: DC | PRN

## 2022-12-28 MED ORDER — LACTATED RINGERS IV SOLN: 125.0000 mL/h | INTRAVENOUS | Status: DC

## 2022-12-28 NOTE — H&P (Addendum)
ADDENDUM: I personally saw and examined the patient at bedside and discussed care with Dr Crissie Reese  Briefly, this is a 32yo [id number] @ 37 4/7 admitted for observation in setting of elevated LFTs and sporadic elevated Bps. Past pregnancy history s/f GHTN, cholestasis, short interval pregnancy. BMI 53 -Patient initially presented with SOB which, on my eval, had resolved    *Imaging WNL for pregnancy, BNP and troponin negative/WNL -Currently normotensive and denies PReE symptoms    *Chart review shows borderline gestational thrombocytopenia since 28wks (152K, 170K, today 141K), rest of CBC WNL    *AST/ALT elevated today at 54/61 from 15/16 two weeks earlier  Admit to [facility]. Nst q shift, regular diet with no activity restriction. Plan to monitor BP overnight and repeat labs in AM. If persistent elevations noted, may move forward with IOL for worsening CHTN.  Category 1 tracing in MAU with irr TOCO q10-60m in last hour of monitoring/ CE 1cm in office today, GBS neg  Dr Ellison Hughs 12/28/22 2236  HISTORY & PHYSICAL  History  [id number]  Arrival date and time: 12/28/22 1652   Chief Complaint  Patient presents with   Shortness of Breath   left-sided arm pain     HPI Carmen Wall is a 32 y.o. G3P2002 at [redacted]w[redacted]d with PMHx notable for iron deficiency anemia and obesity during pregnancy, who presents for dyspnea and left-sided arm pain. She states that for the last two days she has had worsening SOB while walking or laying flat at night. She denies any chest pain, diaphoresis, nausea or vomiting, cough, runny nose, or fever. Denies any sick contacts. She has also had left-sided arm sharp pain and numbness that radiates to her fingers. She denies any issues with movement or sensation in the left arm. She states that this numbness has happened earlier in her pregnancy in the 1st trimester and she saw PT and experienced relief in symptoms.   Vaginal bleeding: No LOF: No Fetal  Movement: Yes Contractions: Yes  A/Positive/-- (01/29 1440)  OB History  Gravida Para Term Preterm AB Living  3 2 2     2   SAB IAB Ectopic Multiple Live Births        0 2    # Outcome Date GA Lbr Len/2nd Weight Sex Type Anes PTL Lv  3 Current           2 Term 07/22/21 [redacted]w[redacted]d  2885 g F Vag-Spont EPI  LIV  1 Term 09/26/11 [redacted]w[redacted]d  3402 g  Vag-Spont   LIV     Past Medical History:  Diagnosis Date   Acute pain of right knee 07/23/2018   Acute right ankle pain 10/24/2018   ADHD    Allergy    Allergy    Anxiety    Anxiety    Arthritis    Arthritis    Back pain    Backache 09/09/2020   Bilateral swelling of feet    Chronic fatigue syndrome    Chronic right-sided low back pain with right-sided sciatica 05/15/2018   Chronic tension-type headache, not intractable 07/04/2022   Constipation    Cough variant asthma 09/26/2019   Depression    Depression 09/05/2017   Excessive daytime sleepiness 03/13/2019   GAD (generalized anxiety disorder) 08/24/2018   IBS (irritable bowel syndrome)    Insomnia 07/04/2022   Joint pain    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   Lactose intolerance    Migraine without aura    Migraine without aura  Neck pain 09/09/2020   Other hyperlipidemia 12/12/2019   Piriformis syndrome of right side 11/11/2019   Radial styloid tenosynovitis 09/09/2020   Restless legs syndrome (RLS) 07/04/2022   Rhinitis, allergic 09/09/2014   Vitamin D deficiency 11/25/2019    Past Surgical History:  Procedure Laterality Date   INTRAUTERINE DEVICE (IUD) INSERTION  04/2017   Mirena    KNEE ARTHROSCOPY Right    WISDOM TOOTH EXTRACTION      Family History  Problem Relation Age of Onset   Diabetes Father    Arthritis Father    Depression Father    Mental illness Father        PTSD, anxiety   Hypertension Father    Hyperlipidemia Father    Anxiety disorder Father    Bipolar disorder Father    Sleep apnea Father    Alcoholism Father    Obesity Father     Arthritis Mother        osteoartthritis   Fibroids Mother    Ovarian cysts Mother    Obesity Mother    Kidney disease Maternal Grandmother    Arthritis Maternal Grandmother    Prostate cancer Maternal Grandfather    Arthritis Maternal Grandfather    Arthritis Paternal Grandmother    Diabetes Paternal Grandfather    Mental illness Paternal Grandfather    Arthritis Paternal Grandfather     Social History   Socioeconomic History   Marital status: Married    Spouse name: Not on file   Number of children: Not on file   Years of education: Not on file   Highest education level: Not on file  Occupational History   Not on file  Tobacco Use   Smoking status: Never   Smokeless tobacco: Never  Vaping Use   Vaping status: Never Used  Substance and Sexual Activity   Alcohol use: No    Alcohol/week: 0.0 standard drinks of alcohol   Drug use: No   Sexual activity: Yes    Partners: Male    Birth control/protection: Pill  Other Topics Concern   Not on file  Social History Narrative   Not on file   Social Determinants of Health   Financial Resource Strain: Not on file  Food Insecurity: No Food Insecurity (07/04/2022)   Hunger Vital Sign    Worried About Running Out of Food in the Last Year: Never true    Ran Out of Food in the Last Year: Never true  Transportation Needs: No Transportation Needs (07/04/2022)   PRAPARE - Administrator, Civil Service (Medical): No    Lack of Transportation (Non-Medical): No  Physical Activity: Not on file  Stress: Not on file  Social Connections: Not on file  Intimate Partner Violence: Not on file    No Known Allergies  No current facility-administered medications on file prior to encounter.   Current Outpatient Medications on File Prior to Encounter  Medication Sig Dispense Refill   aspirin EC 81 MG tablet Take 1 tablet (81 mg total) by mouth daily. Swallow whole. 30 tablet 5   Prenatal Vit-Fe Fumarate-FA (PREPLUS) 27-1 MG TABS  Take 1 tablet by mouth daily.     albuterol (VENTOLIN HFA) 108 (90 Base) MCG/ACT inhaler Inhale 1 puff into the lungs every 6 (six) hours as needed for wheezing or shortness of breath. (Patient not taking: Reported on 10/13/2022) 8 g 0   amitriptyline (ELAVIL) 10 MG tablet Take 1 tablet (10 mg total) by mouth at bedtime. (Patient not taking:  Reported on 08/30/2022) 30 tablet 5   cyclobenzaprine (FLEXERIL) 5 MG tablet Take 1-2 tablets (5-10 mg total) by mouth 3 (three) times daily as needed for muscle spasms. (Patient not taking: Reported on 08/30/2022) 30 tablet 1   ursodiol (ACTIGALL) 300 MG capsule Take 1 capsule (300 mg total) by mouth 2 (two) times daily. (Patient not taking: Reported on 10/13/2022) 60 capsule 4    ROS: Pertinent positives and negative per HPI, all others reviewed and negative  Physical Exam   BP 120/66 (BP Location: Left Arm)   Pulse 89   Temp 98.4 F (36.9 C) (Oral)   Resp 20   Ht 5\' 7"  (1.702 m)   Wt (!) 154.8 kg   LMP 04/09/2022   SpO2 99%   BMI 53.44 kg/m   Patient Vitals for the past 24 hrs:  BP Temp Temp src Pulse Resp SpO2 Height Weight  12/28/22 2035 120/66 -- -- 89 20 99 % -- --  12/28/22 2030 -- -- -- -- -- 99 % -- --  12/28/22 2025 -- -- -- -- -- 98 % -- --  12/28/22 2020 -- -- -- -- -- 98 % -- --  12/28/22 2015 (!) 140/62 -- -- 74 -- 98 % -- --  12/28/22 2010 -- -- -- -- -- 98 % -- --  12/28/22 2005 -- -- -- -- -- 98 % -- --  12/28/22 2000 (!) 141/78 -- -- 89 -- 99 % -- --  12/28/22 1955 -- -- -- -- -- 99 % -- --  12/28/22 1950 -- -- -- -- -- 99 % -- --  12/28/22 1945 (!) 134/94 -- -- 72 -- 98 % -- --  12/28/22 1940 -- -- -- -- -- 98 % -- --  12/28/22 1935 -- -- -- -- -- 98 % -- --  12/28/22 1930 111/76 -- -- 76 -- 98 % -- --  12/28/22 1925 -- -- -- -- -- 99 % -- --  12/28/22 1923 (!) 144/73 -- -- 85 -- -- -- --  12/28/22 1915 -- -- -- -- -- 100 % -- --  12/28/22 1910 -- -- -- -- -- 99 % -- --  12/28/22 1905 -- -- -- -- -- 99 % -- --  12/28/22  1901 134/69 -- -- 72 -- -- -- --  12/28/22 1859 -- -- -- -- -- 99 % -- --  12/28/22 1855 -- -- -- -- -- 99 % -- --  12/28/22 1852 119/69 -- -- 78 -- -- -- --  12/28/22 1849 -- -- -- -- -- 99 % -- --  12/28/22 1845 -- -- -- -- -- 98 % -- --  12/28/22 1840 -- -- -- -- -- 99 % -- --  12/28/22 1835 -- -- -- -- -- 99 % -- --  12/28/22 1830 -- -- -- -- -- 99 % -- --  12/28/22 1825 -- -- -- -- -- 99 % -- --  12/28/22 1820 -- -- -- -- -- 99 % -- --  12/28/22 1816 135/66 -- -- 82 -- -- -- --  12/28/22 1815 -- -- -- -- -- 99 % -- --  12/28/22 1810 -- -- -- -- -- 99 % -- --  12/28/22 1805 -- -- -- -- -- 99 % -- --  12/28/22 1801 (!) 127/58 -- -- 78 -- -- -- --  12/28/22 1800 -- -- -- -- -- 99 % -- --  12/28/22 1755 -- -- -- -- -- 99 % -- --  12/28/22 1750 -- -- -- -- -- 99 % -- --  12/28/22 1746 129/69 -- -- 74 -- -- -- --  12/28/22 1745 -- -- -- -- -- 100 % -- --  12/28/22 1740 -- -- -- -- -- 99 % -- --  12/28/22 1735 -- -- -- -- -- 98 % -- --  12/28/22 1730 128/73 -- -- 79 -- 98 % -- --  12/28/22 1725 -- -- -- -- -- 98 % -- --  12/28/22 1721 132/74 -- -- 81 -- -- -- --  12/28/22 1720 -- -- -- -- -- 99 % -- --  12/28/22 1715 -- -- -- -- -- 97 % -- --  12/28/22 1713 (!) 140/68 98.4 F (36.9 C) Oral 78 (!) 22 97 % -- --  12/28/22 1706 -- -- -- -- -- -- 5\' 7"  (1.702 m) (!) 154.8 kg    Physical Exam Constitutional:      Appearance: She is well-developed.  HENT:     Head: Normocephalic and atraumatic.  Cardiovascular:     Rate and Rhythm: Normal rate and regular rhythm.     Pulses: Normal pulses.  Pulmonary:     Effort: Pulmonary effort is normal. No tachypnea or respiratory distress.     Breath sounds: Normal breath sounds. No stridor. No decreased breath sounds, wheezing, rhonchi or rales.  Musculoskeletal:        General: Normal range of motion.     Right lower leg: No tenderness. No edema.     Left lower leg: No tenderness. No edema.  Skin:    General: Skin is warm and dry.   Neurological:     General: No focal deficit present.     Mental Status: She is alert and oriented to person, place, and time.  Psychiatric:        Mood and Affect: Mood normal.     Bedside Ultrasound not done  FHT 135bpm/Moderate variability/ 15x15 accels/ None decels CAT: 1 Toco: irregular CTX    Results for orders placed or performed during the hospital encounter of 12/28/22 (from the past 24 hour(s))  Urinalysis, Routine w reflex microscopic -Urine, Clean Catch     Status: None   Collection Time: 12/28/22  5:19 PM  Result Value Ref Range   Color, Urine YELLOW YELLOW   APPearance CLEAR CLEAR   Specific Gravity, Urine 1.017 1.005 - 1.030   pH 6.0 5.0 - 8.0   Glucose, UA NEGATIVE NEGATIVE mg/dL   Hgb urine dipstick NEGATIVE NEGATIVE   Bilirubin Urine NEGATIVE NEGATIVE   Ketones, ur NEGATIVE NEGATIVE mg/dL   Protein, ur NEGATIVE NEGATIVE mg/dL   Nitrite NEGATIVE NEGATIVE   Leukocytes,Ua NEGATIVE NEGATIVE  Protein / creatinine ratio, urine     Status: Abnormal   Collection Time: 12/28/22  6:14 PM  Result Value Ref Range   Creatinine, Urine 81 mg/dL   Total Protein, Urine 13 mg/dL   Protein Creatinine Ratio 0.16 (H) 0.00 - 0.15 mg/mg[Cre]  CBC     Status: Abnormal   Collection Time: 12/28/22  6:30 PM  Result Value Ref Range   WBC 6.6 4.0 - 10.5 K/uL   RBC 4.28 3.87 - 5.11 MIL/uL   Hemoglobin 13.1 12.0 - 15.0 g/dL   HCT 09.8 11.9 - 14.7 %   MCV 89.7 80.0 - 100.0 fL   MCH 30.6 26.0 - 34.0 pg   MCHC 34.1 30.0 - 36.0 g/dL   RDW 82.9 56.2 - 13.0 %   Platelets 141 (L) 150 -  400 K/uL   nRBC 0.0 0.0 - 0.2 %  Comprehensive metabolic panel     Status: Abnormal   Collection Time: 12/28/22  6:30 PM  Result Value Ref Range   Sodium 138 135 - 145 mmol/L   Potassium 4.1 3.5 - 5.1 mmol/L   Chloride 103 98 - 111 mmol/L   CO2 22 22 - 32 mmol/L   Glucose, Bld 73 70 - 99 mg/dL   BUN 7 6 - 20 mg/dL   Creatinine, Ser 1.61 0.44 - 1.00 mg/dL   Calcium 9.0 8.9 - 09.6 mg/dL   Total  Protein 6.0 (L) 6.5 - 8.1 g/dL   Albumin 2.5 (L) 3.5 - 5.0 g/dL   AST 54 (H) 15 - 41 U/L   ALT 61 (H) 0 - 44 U/L   Alkaline Phosphatase 159 (H) 38 - 126 U/L   Total Bilirubin 0.8 0.3 - 1.2 mg/dL   GFR, Estimated >04 >54 mL/min   Anion gap 13 5 - 15  Troponin I (High Sensitivity)     Status: None   Collection Time: 12/28/22  6:30 PM  Result Value Ref Range   Troponin I (High Sensitivity) 6 <18 ng/L  Brain natriuretic peptide     Status: None   Collection Time: 12/28/22  6:30 PM  Result Value Ref Range   B Natriuretic Peptide 69.2 0.0 - 100.0 pg/mL   DG Chest Portable 1 View CLINICAL DATA:  Shortness of breath.  EXAM: PORTABLE CHEST 1 VIEW  COMPARISON:  Chest x-ray 01/25/2022.  FINDINGS: Enlarged cardiac silhouette, likely accentuated by technique. Pulmonary vascular congestion. No overt pulmonary edema. No consolidation. No visible pleural effusions or pneumothorax. No acute osseous abnormality.  IMPRESSION: Cardiomegaly and pulmonary vascular congestion without overt pulmonary edema.  Electronically Signed   By: Feliberto Harts M.D.   On: 12/28/2022 18:44   MAU Course   This patient presents to the ED for concern of   Chief Complaint  Patient presents with   Shortness of Breath   left-sided arm pain     These complains involves an extensive number of treatment options, and is a complaint that carries with it a high risk of complications and morbidity.  The differential diagnosis for  1. Dyspnea INCLUDES  respiratory infection, PE, ACS, CHF, anemia, and physiologic etiology.  2. Left-arm pain INCLUDES ACS, carpel tunnel syndrome, musculoskeletal, trauma  Co morbidities that complicate the patient evaluation: iron deficiency anemia, obesity during pregnancy  Interpreter services used: not applicable  External records from outside source obtained and reviewed including Prenatal care records  Lab Tests: UA, CMP, CBC, Urine Protein Creatinine Ratio, and  Troponin  I ordered, and personally interpreted labs.  The pertinent results include:    Imaging Studies ordered:  I ordered imaging studies includingOther CXR I independently visualized and interpreted imaging which showed  I agree with the radiologist interpretation  Cardiac Testing/Monitoring:  EKG was ordered today. I personally reviewed or consulted with a physician to help with intrepretation.   Medicines ordered and prescription drug management:  Medications:    Reevaluation of the patient after these medicines showed that the patient improved I have reviewed the patients home medicines and have made adjustments as needed  Critical Interventions:   MAU Course:   After the interventions noted above, I reevaluated the patient and found that they have :improved  Dispostion:    Assessment and Plan  DESTYNE GOODREAU is a 32 y.o. G3P2002 at [redacted]w[redacted]d with PMHx notable for iron deficiency  anemia and obesity during pregnancy, who presents for dyspnea and left-sided arm pain.  #Dyspnea #37 weeks  The differential diagnosis for dyspnea includes respiratory infection, PE, ACS, CHF, anemia, and physiologic etiology. Low concern for PE at this time due to lack of tachycardia or low O2 levels, no recent travel, non-smoker, no personal or family hx of blood clots. To evaluate for ACS, will order and EKG and troponin levels, though this is also less likely due to the absence of chest pain, diaphoresis or N/V. These ultimately returned as normal. To assess for CHF, will order a BNP, though she does not look particularly volume overloaded on physical exam: no BL leg edema, no lung crackles were appreciated. This also returned normal. Will also order a CXR to rule out pulm edema--interestingly read as cardiomegaly and pulmonary vascular congestion, however on review given lung volumes and her habitus as well as vital signs/exam and normal BNP no suspicion for any intrathoracic  pathology. Final consideration is developing PreE, see below.   #Left-sided arm pain and numbness She attributes a constant left-side arm pain and numbness that radiates to her fingers for the past week. I think that this could be carpel tunnel syndrome, as this is frequently experienced during 3rd trimester pregnancy. She states that her hands have been more swollen in the past week which supports this, and could have compressed her median nerve.  #Hx of Gestational HTN #Chronic hypertension Patient has hx of gestational HTN in her last pregnancy. She is on low dose aspirin and has reported mostly normal BP during this pregnancy. CBC shows mild thrombocytopenia, CMP shows abnormal LFTs but not 2x upper limit of normal. UPCR normal. Discussed overall presentation with Dr. Reina Fuse, including ongoing mild range BP's here in MAU (normotensive throughout pregnancy), new and worsening shortness of breath. On her review of chart appears to have gestational thrombocytopenia, however LFT abnormalities are new. At this point concern for possible developing pre-eclampsia. After discussion with Dr. Reina Fuse plan is to place patient in observation overnight and reassess in the AM.    Dispo: admit to antepartum. Care turned over to private physician.   Oscar La, Medical Student 12/28/22  6:15 PM    ATTENDING ATTESTATION  I have seen and examined this patient and agree with the above documentation in the medical student's note except as below.  I have edited the above note for accuracy and clarity  Carmen Maples, MD/[facility] [employer] [facility] (Faculty Practice) 12/28/2022, 8:45 PM

## 2022-12-28 NOTE — MAU Provider Note (Signed)
MAU Provider Note  History  [id number]  Arrival date and time: 12/28/22 1652   Chief Complaint  Patient presents with   Shortness of Breath   left-sided arm pain     HPI Carmen Wall is a 32 y.o. G3P2002 at [redacted]w[redacted]d with PMHx notable for iron deficiency anemia and obesity during pregnancy, who presents for dyspnea and left-sided arm pain. She states that for the last two days she has had worsening SOB while walking or laying flat at night. She denies any chest pain, diaphoresis, nausea or vomiting, cough, runny nose, or fever. Denies any sick contacts. She has also had left-sided arm sharp pain and numbness that radiates to her fingers. She denies any issues with movement or sensation in the left arm. She states that this numbness has happened earlier in her pregnancy in the 1st trimester and she saw PT and experienced relief in symptoms.   Vaginal bleeding: No LOF: No Fetal Movement: Yes Contractions: Yes  A/Positive/-- (01/29 1440)  OB History  Gravida Para Term Preterm AB Living  3 2 2     2   SAB IAB Ectopic Multiple Live Births        0 2    # Outcome Date GA Lbr Len/2nd Weight Sex Type Anes PTL Lv  3 Current           2 Term 07/22/21 [redacted]w[redacted]d  2885 g F Vag-Spont EPI  LIV  1 Term 09/26/11 [redacted]w[redacted]d  3402 g  Vag-Spont   LIV     Past Medical History:  Diagnosis Date   Acute pain of right knee 07/23/2018   Acute right ankle pain 10/24/2018   ADHD    Allergy    Allergy    Anxiety    Anxiety    Arthritis    Arthritis    Back pain    Backache 09/09/2020   Bilateral swelling of feet    Chronic fatigue syndrome    Chronic right-sided low back pain with right-sided sciatica 05/15/2018   Chronic tension-type headache, not intractable 07/04/2022   Constipation    Cough variant asthma 09/26/2019   Depression    Depression 09/05/2017   Excessive daytime sleepiness 03/13/2019   GAD (generalized anxiety disorder) 08/24/2018   IBS (irritable bowel syndrome)     Insomnia 07/04/2022   Joint pain    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   Lactose intolerance    Migraine without aura    Migraine without aura    Neck pain 09/09/2020   Other hyperlipidemia 12/12/2019   Piriformis syndrome of right side 11/11/2019   Radial styloid tenosynovitis 09/09/2020   Restless legs syndrome (RLS) 07/04/2022   Rhinitis, allergic 09/09/2014   Vitamin D deficiency 11/25/2019    Past Surgical History:  Procedure Laterality Date   INTRAUTERINE DEVICE (IUD) INSERTION  04/2017   Mirena    KNEE ARTHROSCOPY Right    WISDOM TOOTH EXTRACTION      Family History  Problem Relation Age of Onset   Diabetes Father    Arthritis Father    Depression Father    Mental illness Father        PTSD, anxiety   Hypertension Father    Hyperlipidemia Father    Anxiety disorder Father    Bipolar disorder Father    Sleep apnea Father    Alcoholism Father    Obesity Father    Arthritis Mother        osteoartthritis   Fibroids Mother  Ovarian cysts Mother    Obesity Mother    Kidney disease Maternal Grandmother    Arthritis Maternal Grandmother    Prostate cancer Maternal Grandfather    Arthritis Maternal Grandfather    Arthritis Paternal Grandmother    Diabetes Paternal Grandfather    Mental illness Paternal Grandfather    Arthritis Paternal Grandfather     Social History   Socioeconomic History   Marital status: Married    Spouse name: Not on file   Number of children: Not on file   Years of education: Not on file   Highest education level: Not on file  Occupational History   Not on file  Tobacco Use   Smoking status: Never   Smokeless tobacco: Never  Vaping Use   Vaping status: Never Used  Substance and Sexual Activity   Alcohol use: No    Alcohol/week: 0.0 standard drinks of alcohol   Drug use: No   Sexual activity: Yes    Partners: Male    Birth control/protection: Pill  Other Topics Concern   Not on file  Social History Narrative   Not  on file   Social Determinants of Health   Financial Resource Strain: Not on file  Food Insecurity: No Food Insecurity (07/04/2022)   Hunger Vital Sign    Worried About Running Out of Food in the Last Year: Never true    Ran Out of Food in the Last Year: Never true  Transportation Needs: No Transportation Needs (07/04/2022)   PRAPARE - Administrator, Civil Service (Medical): No    Lack of Transportation (Non-Medical): No  Physical Activity: Not on file  Stress: Not on file  Social Connections: Not on file  Intimate Partner Violence: Not on file    No Known Allergies  No current facility-administered medications on file prior to encounter.   Current Outpatient Medications on File Prior to Encounter  Medication Sig Dispense Refill   aspirin EC 81 MG tablet Take 1 tablet (81 mg total) by mouth daily. Swallow whole. 30 tablet 5   Prenatal Vit-Fe Fumarate-FA (PREPLUS) 27-1 MG TABS Take 1 tablet by mouth daily.     albuterol (VENTOLIN HFA) 108 (90 Base) MCG/ACT inhaler Inhale 1 puff into the lungs every 6 (six) hours as needed for wheezing or shortness of breath. (Patient not taking: Reported on 10/13/2022) 8 g 0   amitriptyline (ELAVIL) 10 MG tablet Take 1 tablet (10 mg total) by mouth at bedtime. (Patient not taking: Reported on 08/30/2022) 30 tablet 5   cyclobenzaprine (FLEXERIL) 5 MG tablet Take 1-2 tablets (5-10 mg total) by mouth 3 (three) times daily as needed for muscle spasms. (Patient not taking: Reported on 08/30/2022) 30 tablet 1   ursodiol (ACTIGALL) 300 MG capsule Take 1 capsule (300 mg total) by mouth 2 (two) times daily. (Patient not taking: Reported on 10/13/2022) 60 capsule 4    ROS: Pertinent positives and negative per HPI, all others reviewed and negative  Physical Exam   BP 120/66 (BP Location: Left Arm)   Pulse 89   Temp 98.4 F (36.9 C) (Oral)   Resp 20   Ht 5\' 7"  (1.702 m)   Wt (!) 154.8 kg   LMP 04/09/2022   SpO2 99%   BMI 53.44 kg/m   Patient  Vitals for the past 24 hrs:  BP Temp Temp src Pulse Resp SpO2 Height Weight  12/28/22 2035 120/66 -- -- 89 20 99 % -- --  12/28/22 2030 -- -- -- -- --  99 % -- --  12/28/22 2025 -- -- -- -- -- 98 % -- --  12/28/22 2020 -- -- -- -- -- 98 % -- --  12/28/22 2015 (!) 140/62 -- -- 74 -- 98 % -- --  12/28/22 2010 -- -- -- -- -- 98 % -- --  12/28/22 2005 -- -- -- -- -- 98 % -- --  12/28/22 2000 (!) 141/78 -- -- 89 -- 99 % -- --  12/28/22 1955 -- -- -- -- -- 99 % -- --  12/28/22 1950 -- -- -- -- -- 99 % -- --  12/28/22 1945 (!) 134/94 -- -- 72 -- 98 % -- --  12/28/22 1940 -- -- -- -- -- 98 % -- --  12/28/22 1935 -- -- -- -- -- 98 % -- --  12/28/22 1930 111/76 -- -- 76 -- 98 % -- --  12/28/22 1925 -- -- -- -- -- 99 % -- --  12/28/22 1923 (!) 144/73 -- -- 85 -- -- -- --  12/28/22 1915 -- -- -- -- -- 100 % -- --  12/28/22 1910 -- -- -- -- -- 99 % -- --  12/28/22 1905 -- -- -- -- -- 99 % -- --  12/28/22 1901 134/69 -- -- 72 -- -- -- --  12/28/22 1859 -- -- -- -- -- 99 % -- --  12/28/22 1855 -- -- -- -- -- 99 % -- --  12/28/22 1852 119/69 -- -- 78 -- -- -- --  12/28/22 1849 -- -- -- -- -- 99 % -- --  12/28/22 1845 -- -- -- -- -- 98 % -- --  12/28/22 1840 -- -- -- -- -- 99 % -- --  12/28/22 1835 -- -- -- -- -- 99 % -- --  12/28/22 1830 -- -- -- -- -- 99 % -- --  12/28/22 1825 -- -- -- -- -- 99 % -- --  12/28/22 1820 -- -- -- -- -- 99 % -- --  12/28/22 1816 135/66 -- -- 82 -- -- -- --  12/28/22 1815 -- -- -- -- -- 99 % -- --  12/28/22 1810 -- -- -- -- -- 99 % -- --  12/28/22 1805 -- -- -- -- -- 99 % -- --  12/28/22 1801 (!) 127/58 -- -- 78 -- -- -- --  12/28/22 1800 -- -- -- -- -- 99 % -- --  12/28/22 1755 -- -- -- -- -- 99 % -- --  12/28/22 1750 -- -- -- -- -- 99 % -- --  12/28/22 1746 129/69 -- -- 74 -- -- -- --  12/28/22 1745 -- -- -- -- -- 100 % -- --  12/28/22 1740 -- -- -- -- -- 99 % -- --  12/28/22 1735 -- -- -- -- -- 98 % -- --  12/28/22 1730 128/73 -- -- 79 -- 98 % -- --   12/28/22 1725 -- -- -- -- -- 98 % -- --  12/28/22 1721 132/74 -- -- 81 -- -- -- --  12/28/22 1720 -- -- -- -- -- 99 % -- --  12/28/22 1715 -- -- -- -- -- 97 % -- --  12/28/22 1713 (!) 140/68 98.4 F (36.9 C) Oral 78 (!) 22 97 % -- --  12/28/22 1706 -- -- -- -- -- -- 5\' 7"  (1.702 m) (!) 154.8 kg    Physical Exam Constitutional:      Appearance: She is well-developed.  HENT:     Head: Normocephalic and  atraumatic.  Cardiovascular:     Rate and Rhythm: Normal rate and regular rhythm.     Pulses: Normal pulses.  Pulmonary:     Effort: Pulmonary effort is normal. No tachypnea or respiratory distress.     Breath sounds: Normal breath sounds. No stridor. No decreased breath sounds, wheezing, rhonchi or rales.  Musculoskeletal:        General: Normal range of motion.     Right lower leg: No tenderness. No edema.     Left lower leg: No tenderness. No edema.  Skin:    General: Skin is warm and dry.  Neurological:     General: No focal deficit present.     Mental Status: She is alert and oriented to person, place, and time.  Psychiatric:        Mood and Affect: Mood normal.     Bedside Ultrasound not done  FHT 135bpm/Moderate variability/ 15x15 accels/ None decels CAT: 1 Toco: irregular CTX    Results for orders placed or performed during the hospital encounter of 12/28/22 (from the past 24 hour(s))  Urinalysis, Routine w reflex microscopic -Urine, Clean Catch     Status: None   Collection Time: 12/28/22  5:19 PM  Result Value Ref Range   Color, Urine YELLOW YELLOW   APPearance CLEAR CLEAR   Specific Gravity, Urine 1.017 1.005 - 1.030   pH 6.0 5.0 - 8.0   Glucose, UA NEGATIVE NEGATIVE mg/dL   Hgb urine dipstick NEGATIVE NEGATIVE   Bilirubin Urine NEGATIVE NEGATIVE   Ketones, ur NEGATIVE NEGATIVE mg/dL   Protein, ur NEGATIVE NEGATIVE mg/dL   Nitrite NEGATIVE NEGATIVE   Leukocytes,Ua NEGATIVE NEGATIVE  Protein / creatinine ratio, urine     Status: Abnormal   Collection  Time: 12/28/22  6:14 PM  Result Value Ref Range   Creatinine, Urine 81 mg/dL   Total Protein, Urine 13 mg/dL   Protein Creatinine Ratio 0.16 (H) 0.00 - 0.15 mg/mg[Cre]  CBC     Status: Abnormal   Collection Time: 12/28/22  6:30 PM  Result Value Ref Range   WBC 6.6 4.0 - 10.5 K/uL   RBC 4.28 3.87 - 5.11 MIL/uL   Hemoglobin 13.1 12.0 - 15.0 g/dL   HCT 21.3 08.6 - 57.8 %   MCV 89.7 80.0 - 100.0 fL   MCH 30.6 26.0 - 34.0 pg   MCHC 34.1 30.0 - 36.0 g/dL   RDW 46.9 62.9 - 52.8 %   Platelets 141 (L) 150 - 400 K/uL   nRBC 0.0 0.0 - 0.2 %  Comprehensive metabolic panel     Status: Abnormal   Collection Time: 12/28/22  6:30 PM  Result Value Ref Range   Sodium 138 135 - 145 mmol/L   Potassium 4.1 3.5 - 5.1 mmol/L   Chloride 103 98 - 111 mmol/L   CO2 22 22 - 32 mmol/L   Glucose, Bld 73 70 - 99 mg/dL   BUN 7 6 - 20 mg/dL   Creatinine, Ser 4.13 0.44 - 1.00 mg/dL   Calcium 9.0 8.9 - 24.4 mg/dL   Total Protein 6.0 (L) 6.5 - 8.1 g/dL   Albumin 2.5 (L) 3.5 - 5.0 g/dL   AST 54 (H) 15 - 41 U/L   ALT 61 (H) 0 - 44 U/L   Alkaline Phosphatase 159 (H) 38 - 126 U/L   Total Bilirubin 0.8 0.3 - 1.2 mg/dL   GFR, Estimated >01 >02 mL/min   Anion gap 13 5 - 15  Troponin I (  High Sensitivity)     Status: None   Collection Time: 12/28/22  6:30 PM  Result Value Ref Range   Troponin I (High Sensitivity) 6 <18 ng/L  Brain natriuretic peptide     Status: None   Collection Time: 12/28/22  6:30 PM  Result Value Ref Range   B Natriuretic Peptide 69.2 0.0 - 100.0 pg/mL   DG Chest Portable 1 View CLINICAL DATA:  Shortness of breath.  EXAM: PORTABLE CHEST 1 VIEW  COMPARISON:  Chest x-ray 01/25/2022.  FINDINGS: Enlarged cardiac silhouette, likely accentuated by technique. Pulmonary vascular congestion. No overt pulmonary edema. No consolidation. No visible pleural effusions or pneumothorax. No acute osseous abnormality.  IMPRESSION: Cardiomegaly and pulmonary vascular congestion without  overt pulmonary edema.  Electronically Signed   By: Feliberto Harts M.D.   On: 12/28/2022 18:44   MAU Course   This patient presents to the ED for concern of   Chief Complaint  Patient presents with   Shortness of Breath   left-sided arm pain     These complains involves an extensive number of treatment options, and is a complaint that carries with it a high risk of complications and morbidity.  The differential diagnosis for  1. Dyspnea INCLUDES  respiratory infection, PE, ACS, CHF, anemia, and physiologic etiology.  2. Left-arm pain INCLUDES ACS, carpel tunnel syndrome, musculoskeletal, trauma  Co morbidities that complicate the patient evaluation: iron deficiency anemia, obesity during pregnancy  Interpreter services used: not applicable  External records from outside source obtained and reviewed including Prenatal care records  Lab Tests: UA, CMP, CBC, Urine Protein Creatinine Ratio, and Troponin  I ordered, and personally interpreted labs.  The pertinent results include:    Imaging Studies ordered:  I ordered imaging studies includingOther CXR I independently visualized and interpreted imaging which showed  I agree with the radiologist interpretation  Cardiac Testing/Monitoring:  EKG was ordered today. I personally reviewed or consulted with a physician to help with intrepretation.   Medicines ordered and prescription drug management:  Medications:    Reevaluation of the patient after these medicines showed that the patient improved I have reviewed the patients home medicines and have made adjustments as needed  Critical Interventions:   MAU Course:   After the interventions noted above, I reevaluated the patient and found that they have :improved  Dispostion:    Assessment and Plan  Carmen Wall is a 32 y.o. G3P2002 at [redacted]w[redacted]d with PMHx notable for iron deficiency anemia and obesity during pregnancy, who presents for dyspnea and  left-sided arm pain.  #Dyspnea #37 weeks  The differential diagnosis for dyspnea includes respiratory infection, PE, ACS, CHF, anemia, and physiologic etiology. Low concern for PE at this time due to lack of tachycardia or low O2 levels, no recent travel, non-smoker, no personal or family hx of blood clots. To evaluate for ACS, will order and EKG and troponin levels, though this is also less likely due to the absence of chest pain, diaphoresis or N/V. These ultimately returned as normal. To assess for CHF, will order a BNP, though she does not look particularly volume overloaded on physical exam: no BL leg edema, no lung crackles were appreciated. This also returned normal. Will also order a CXR to rule out pulm edema--interestingly read as cardiomegaly and pulmonary vascular congestion, however on review given lung volumes and her habitus as well as vital signs/exam and normal BNP no suspicion for any intrathoracic pathology. Final consideration is developing PreE, see below.   #  Left-sided arm pain and numbness She attributes a constant left-side arm pain and numbness that radiates to her fingers for the past week. I think that this could be carpel tunnel syndrome, as this is frequently experienced during 3rd trimester pregnancy. She states that her hands have been more swollen in the past week which supports this, and could have compressed her median nerve.  #Hx of Gestational HTN #Chronic hypertension Patient has hx of gestational HTN in her last pregnancy. She is on low dose aspirin and has reported mostly normal BP during this pregnancy. CBC shows mild thrombocytopenia, CMP shows abnormal LFTs but not 2x upper limit of normal. UPCR normal. Discussed overall presentation with Dr. Reina Fuse, including ongoing mild range BP's here in MAU (normotensive throughout pregnancy), new and worsening shortness of breath. On her review of chart appears to have gestational thrombocytopenia, however LFT abnormalities  are new. At this point concern for possible developing pre-eclampsia. After discussion with Dr. Reina Fuse plan is to place patient in observation overnight and reassess in the AM.    Dispo: admit to antepartum. Care turned over to private physician.   Oscar La, Medical Student 12/28/22  6:15 PM    ATTENDING ATTESTATION  I have seen and examined this patient and agree with the above documentation in the medical student's note except as below.  I have edited the above note for accuracy and clarity  Venora Maples, MD/[facility] [employer] (Faculty Practice) 12/28/2022, 8:45 PM

## 2022-12-28 NOTE — Progress Notes (Deleted)
MAU Provider Note  History  [id number]  Arrival date and time: 12/28/22 1652   Chief Complaint  Patient presents with   Shortness of Breath   left-sided arm pain     HPI Carmen Wall is a 32 y.o. G3P2002 at [redacted]w[redacted]d with PMHx notable for iron deficiency anemia and obesity during pregnancy, who presents for dyspnea and left-sided arm pain. She states that for the last two days she has had worsening SOB while walking or laying flat at night. She denies any chest pain, diaphoresis, nausea or vomiting, cough, runny nose, or fever. Denies any sick contacts. She has also had left-sided arm sharp pain and numbness that radiates to her fingers. She denies any issues with movement or sensation in the left arm. She states that this numbness has happened earlier in her pregnancy in the 1st trimester and she saw PT and experienced relief in symptoms.   Vaginal bleeding: No LOF: No Fetal Movement: Yes Contractions: Yes  A/Positive/-- (01/29 1440)  OB History  Gravida Para Term Preterm AB Living  3 2 2     2   SAB IAB Ectopic Multiple Live Births        0 2    # Outcome Date GA Lbr Len/2nd Weight Sex Type Anes PTL Lv  3 Current           2 Term 07/22/21 [redacted]w[redacted]d  2885 g F Vag-Spont EPI  LIV  1 Term 09/26/11 [redacted]w[redacted]d  3402 g  Vag-Spont   LIV     Past Medical History:  Diagnosis Date   Acute pain of right knee 07/23/2018   Acute right ankle pain 10/24/2018   ADHD    Allergy    Allergy    Anxiety    Anxiety    Arthritis    Arthritis    Back pain    Backache 09/09/2020   Bilateral swelling of feet    Chronic fatigue syndrome    Chronic right-sided low back pain with right-sided sciatica 05/15/2018   Chronic tension-type headache, not intractable 07/04/2022   Constipation    Cough variant asthma 09/26/2019   Depression    Depression 09/05/2017   Excessive daytime sleepiness 03/13/2019   GAD (generalized anxiety disorder) 08/24/2018   IBS (irritable bowel syndrome)     Insomnia 07/04/2022   Joint pain    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   Lactose intolerance    Migraine without aura    Migraine without aura    Neck pain 09/09/2020   Other hyperlipidemia 12/12/2019   Piriformis syndrome of right side 11/11/2019   Radial styloid tenosynovitis 09/09/2020   Restless legs syndrome (RLS) 07/04/2022   Rhinitis, allergic 09/09/2014   Vitamin D deficiency 11/25/2019    Past Surgical History:  Procedure Laterality Date   INTRAUTERINE DEVICE (IUD) INSERTION  04/2017   Mirena    KNEE ARTHROSCOPY Right    WISDOM TOOTH EXTRACTION      Family History  Problem Relation Age of Onset   Diabetes Father    Arthritis Father    Depression Father    Mental illness Father        PTSD, anxiety   Hypertension Father    Hyperlipidemia Father    Anxiety disorder Father    Bipolar disorder Father    Sleep apnea Father    Alcoholism Father    Obesity Father    Arthritis Mother        osteoartthritis   Fibroids Mother  Ovarian cysts Mother    Obesity Mother    Kidney disease Maternal Grandmother    Arthritis Maternal Grandmother    Prostate cancer Maternal Grandfather    Arthritis Maternal Grandfather    Arthritis Paternal Grandmother    Diabetes Paternal Grandfather    Mental illness Paternal Grandfather    Arthritis Paternal Grandfather     Social History   Socioeconomic History   Marital status: Married    Spouse name: Not on file   Number of children: Not on file   Years of education: Not on file   Highest education level: Not on file  Occupational History   Not on file  Tobacco Use   Smoking status: Never   Smokeless tobacco: Never  Vaping Use   Vaping status: Never Used  Substance and Sexual Activity   Alcohol use: No    Alcohol/week: 0.0 standard drinks of alcohol   Drug use: No   Sexual activity: Yes    Partners: Male    Birth control/protection: Pill  Other Topics Concern   Not on file  Social History Narrative   Not  on file   Social Determinants of Health   Financial Resource Strain: Not on file  Food Insecurity: No Food Insecurity (07/04/2022)   Hunger Vital Sign    Worried About Running Out of Food in the Last Year: Never true    Ran Out of Food in the Last Year: Never true  Transportation Needs: No Transportation Needs (07/04/2022)   PRAPARE - Administrator, Civil Service (Medical): No    Lack of Transportation (Non-Medical): No  Physical Activity: Not on file  Stress: Not on file  Social Connections: Not on file  Intimate Partner Violence: Not on file    No Known Allergies  No current facility-administered medications on file prior to encounter.   Current Outpatient Medications on File Prior to Encounter  Medication Sig Dispense Refill   aspirin EC 81 MG tablet Take 1 tablet (81 mg total) by mouth daily. Swallow whole. 30 tablet 5   Prenatal Vit-Fe Fumarate-FA (PREPLUS) 27-1 MG TABS Take 1 tablet by mouth daily.     albuterol (VENTOLIN HFA) 108 (90 Base) MCG/ACT inhaler Inhale 1 puff into the lungs every 6 (six) hours as needed for wheezing or shortness of breath. (Patient not taking: Reported on 10/13/2022) 8 g 0   amitriptyline (ELAVIL) 10 MG tablet Take 1 tablet (10 mg total) by mouth at bedtime. (Patient not taking: Reported on 08/30/2022) 30 tablet 5   cyclobenzaprine (FLEXERIL) 5 MG tablet Take 1-2 tablets (5-10 mg total) by mouth 3 (three) times daily as needed for muscle spasms. (Patient not taking: Reported on 08/30/2022) 30 tablet 1   ursodiol (ACTIGALL) 300 MG capsule Take 1 capsule (300 mg total) by mouth 2 (two) times daily. (Patient not taking: Reported on 10/13/2022) 60 capsule 4    ROS: Pertinent positives and negative per HPI, all others reviewed and negative  Physical Exam   BP 120/66 (BP Location: Left Arm)   Pulse 89   Temp 98.4 F (36.9 C) (Oral)   Resp 20   Ht 5\' 7"  (1.702 m)   Wt (!) 154.8 kg   LMP 04/09/2022   SpO2 99%   BMI 53.44 kg/m   Patient  Vitals for the past 24 hrs:  BP Temp Temp src Pulse Resp SpO2 Height Weight  12/28/22 2035 120/66 -- -- 89 20 99 % -- --  12/28/22 2030 -- -- -- -- --  99 % -- --  12/28/22 2025 -- -- -- -- -- 98 % -- --  12/28/22 2020 -- -- -- -- -- 98 % -- --  12/28/22 2015 (!) 140/62 -- -- 74 -- 98 % -- --  12/28/22 2010 -- -- -- -- -- 98 % -- --  12/28/22 2005 -- -- -- -- -- 98 % -- --  12/28/22 2000 (!) 141/78 -- -- 89 -- 99 % -- --  12/28/22 1955 -- -- -- -- -- 99 % -- --  12/28/22 1950 -- -- -- -- -- 99 % -- --  12/28/22 1945 (!) 134/94 -- -- 72 -- 98 % -- --  12/28/22 1940 -- -- -- -- -- 98 % -- --  12/28/22 1935 -- -- -- -- -- 98 % -- --  12/28/22 1930 111/76 -- -- 76 -- 98 % -- --  12/28/22 1925 -- -- -- -- -- 99 % -- --  12/28/22 1923 (!) 144/73 -- -- 85 -- -- -- --  12/28/22 1915 -- -- -- -- -- 100 % -- --  12/28/22 1910 -- -- -- -- -- 99 % -- --  12/28/22 1905 -- -- -- -- -- 99 % -- --  12/28/22 1901 134/69 -- -- 72 -- -- -- --  12/28/22 1859 -- -- -- -- -- 99 % -- --  12/28/22 1855 -- -- -- -- -- 99 % -- --  12/28/22 1852 119/69 -- -- 78 -- -- -- --  12/28/22 1849 -- -- -- -- -- 99 % -- --  12/28/22 1845 -- -- -- -- -- 98 % -- --  12/28/22 1840 -- -- -- -- -- 99 % -- --  12/28/22 1835 -- -- -- -- -- 99 % -- --  12/28/22 1830 -- -- -- -- -- 99 % -- --  12/28/22 1825 -- -- -- -- -- 99 % -- --  12/28/22 1820 -- -- -- -- -- 99 % -- --  12/28/22 1816 135/66 -- -- 82 -- -- -- --  12/28/22 1815 -- -- -- -- -- 99 % -- --  12/28/22 1810 -- -- -- -- -- 99 % -- --  12/28/22 1805 -- -- -- -- -- 99 % -- --  12/28/22 1801 (!) 127/58 -- -- 78 -- -- -- --  12/28/22 1800 -- -- -- -- -- 99 % -- --  12/28/22 1755 -- -- -- -- -- 99 % -- --  12/28/22 1750 -- -- -- -- -- 99 % -- --  12/28/22 1746 129/69 -- -- 74 -- -- -- --  12/28/22 1745 -- -- -- -- -- 100 % -- --  12/28/22 1740 -- -- -- -- -- 99 % -- --  12/28/22 1735 -- -- -- -- -- 98 % -- --  12/28/22 1730 128/73 -- -- 79 -- 98 % -- --   12/28/22 1725 -- -- -- -- -- 98 % -- --  12/28/22 1721 132/74 -- -- 81 -- -- -- --  12/28/22 1720 -- -- -- -- -- 99 % -- --  12/28/22 1715 -- -- -- -- -- 97 % -- --  12/28/22 1713 (!) 140/68 98.4 F (36.9 C) Oral 78 (!) 22 97 % -- --  12/28/22 1706 -- -- -- -- -- -- 5\' 7"  (1.702 m) (!) 154.8 kg    Physical Exam Constitutional:      Appearance: She is well-developed.  HENT:     Head: Normocephalic and  atraumatic.  Cardiovascular:     Rate and Rhythm: Normal rate and regular rhythm.     Pulses: Normal pulses.  Pulmonary:     Effort: Pulmonary effort is normal. No tachypnea or respiratory distress.     Breath sounds: Normal breath sounds. No stridor. No decreased breath sounds, wheezing, rhonchi or rales.  Musculoskeletal:        General: Normal range of motion.     Right lower leg: No tenderness. No edema.     Left lower leg: No tenderness. No edema.  Skin:    General: Skin is warm and dry.  Neurological:     General: No focal deficit present.     Mental Status: She is alert and oriented to person, place, and time.  Psychiatric:        Mood and Affect: Mood normal.     Bedside Ultrasound not done  FHT 135bpm/Moderate variability/ 15x15 accels/ None decels CAT: 1 Toco: irregular CTX    Results for orders placed or performed during the hospital encounter of 12/28/22 (from the past 24 hour(s))  Urinalysis, Routine w reflex microscopic -Urine, Clean Catch     Status: None   Collection Time: 12/28/22  5:19 PM  Result Value Ref Range   Color, Urine YELLOW YELLOW   APPearance CLEAR CLEAR   Specific Gravity, Urine 1.017 1.005 - 1.030   pH 6.0 5.0 - 8.0   Glucose, UA NEGATIVE NEGATIVE mg/dL   Hgb urine dipstick NEGATIVE NEGATIVE   Bilirubin Urine NEGATIVE NEGATIVE   Ketones, ur NEGATIVE NEGATIVE mg/dL   Protein, ur NEGATIVE NEGATIVE mg/dL   Nitrite NEGATIVE NEGATIVE   Leukocytes,Ua NEGATIVE NEGATIVE  Protein / creatinine ratio, urine     Status: Abnormal   Collection  Time: 12/28/22  6:14 PM  Result Value Ref Range   Creatinine, Urine 81 mg/dL   Total Protein, Urine 13 mg/dL   Protein Creatinine Ratio 0.16 (H) 0.00 - 0.15 mg/mg[Cre]  CBC     Status: Abnormal   Collection Time: 12/28/22  6:30 PM  Result Value Ref Range   WBC 6.6 4.0 - 10.5 K/uL   RBC 4.28 3.87 - 5.11 MIL/uL   Hemoglobin 13.1 12.0 - 15.0 g/dL   HCT 21.3 08.6 - 57.8 %   MCV 89.7 80.0 - 100.0 fL   MCH 30.6 26.0 - 34.0 pg   MCHC 34.1 30.0 - 36.0 g/dL   RDW 46.9 62.9 - 52.8 %   Platelets 141 (L) 150 - 400 K/uL   nRBC 0.0 0.0 - 0.2 %  Comprehensive metabolic panel     Status: Abnormal   Collection Time: 12/28/22  6:30 PM  Result Value Ref Range   Sodium 138 135 - 145 mmol/L   Potassium 4.1 3.5 - 5.1 mmol/L   Chloride 103 98 - 111 mmol/L   CO2 22 22 - 32 mmol/L   Glucose, Bld 73 70 - 99 mg/dL   BUN 7 6 - 20 mg/dL   Creatinine, Ser 4.13 0.44 - 1.00 mg/dL   Calcium 9.0 8.9 - 24.4 mg/dL   Total Protein 6.0 (L) 6.5 - 8.1 g/dL   Albumin 2.5 (L) 3.5 - 5.0 g/dL   AST 54 (H) 15 - 41 U/L   ALT 61 (H) 0 - 44 U/L   Alkaline Phosphatase 159 (H) 38 - 126 U/L   Total Bilirubin 0.8 0.3 - 1.2 mg/dL   GFR, Estimated >01 >02 mL/min   Anion gap 13 5 - 15  Troponin I (  High Sensitivity)     Status: None   Collection Time: 12/28/22  6:30 PM  Result Value Ref Range   Troponin I (High Sensitivity) 6 <18 ng/L  Brain natriuretic peptide     Status: None   Collection Time: 12/28/22  6:30 PM  Result Value Ref Range   B Natriuretic Peptide 69.2 0.0 - 100.0 pg/mL   DG Chest Portable 1 View CLINICAL DATA:  Shortness of breath.  EXAM: PORTABLE CHEST 1 VIEW  COMPARISON:  Chest x-ray 01/25/2022.  FINDINGS: Enlarged cardiac silhouette, likely accentuated by technique. Pulmonary vascular congestion. No overt pulmonary edema. No consolidation. No visible pleural effusions or pneumothorax. No acute osseous abnormality.  IMPRESSION: Cardiomegaly and pulmonary vascular congestion without  overt pulmonary edema.  Electronically Signed   By: Feliberto Harts M.D.   On: 12/28/2022 18:44   MAU Course   This patient presents to the ED for concern of   Chief Complaint  Patient presents with   Shortness of Breath   left-sided arm pain     These complains involves an extensive number of treatment options, and is a complaint that carries with it a high risk of complications and morbidity.  The differential diagnosis for  1. Dyspnea INCLUDES  respiratory infection, PE, ACS, CHF, anemia, and physiologic etiology.  2. Left-arm pain INCLUDES ACS, carpel tunnel syndrome, musculoskeletal, trauma  Co morbidities that complicate the patient evaluation: iron deficiency anemia, obesity during pregnancy  Interpreter services used: not applicable  External records from outside source obtained and reviewed including Prenatal care records  Lab Tests: UA, CMP, CBC, Urine Protein Creatinine Ratio, and Troponin  I ordered, and personally interpreted labs.  The pertinent results include:    Imaging Studies ordered:  I ordered imaging studies includingOther CXR I independently visualized and interpreted imaging which showed  I agree with the radiologist interpretation  Cardiac Testing/Monitoring:  EKG was ordered today. I personally reviewed or consulted with a physician to help with intrepretation.   Medicines ordered and prescription drug management:  Medications:    Reevaluation of the patient after these medicines showed that the patient improved I have reviewed the patients home medicines and have made adjustments as needed  Critical Interventions:   MAU Course:   After the interventions noted above, I reevaluated the patient and found that they have :improved  Dispostion:    Assessment and Plan  AMERIS AKAMINE is a 32 y.o. G3P2002 at [redacted]w[redacted]d with PMHx notable for iron deficiency anemia and obesity during pregnancy, who presents for dyspnea and  left-sided arm pain.  #Dyspnea #37 weeks  The differential diagnosis for dyspnea includes respiratory infection, PE, ACS, CHF, anemia, and physiologic etiology. Low concern for PE at this time due to lack of tachycardia or low O2 levels, no recent travel, non-smoker, no personal or family hx of blood clots. To evaluate for ACS, will order and EKG and troponin levels, though this is also less likely due to the absence of chest pain, diaphoresis or N/V. These ultimately returned as normal. To assess for CHF, will order a BNP, though she does not look particularly volume overloaded on physical exam: no BL leg edema, no lung crackles were appreciated. This also returned normal. Will also order a CXR to rule out pulm edema--interestingly read as cardiomegaly and pulmonary vascular congestion, however on review given lung volumes and her habitus as well as vital signs/exam and normal BNP no suspicion for any intrathoracic pathology. Final consideration is developing PreE, see below.   #  Left-sided arm pain and numbness She attributes a constant left-side arm pain and numbness that radiates to her fingers for the past week. I think that this could be carpel tunnel syndrome, as this is frequently experienced during 3rd trimester pregnancy. She states that her hands have been more swollen in the past week which supports this, and could have compressed her median nerve.  #Hx of Gestational HTN #Chronic hypertension Patient has hx of gestational HTN in her last pregnancy. She is on low dose aspirin and has reported mostly normal BP during this pregnancy. CBC shows mild thrombocytopenia, CMP shows abnormal LFTs but not 2x upper limit of normal. UPCR normal. Discussed overall presentation with Dr. Reina Fuse, including ongoing mild range BP's here in MAU (normotensive throughout pregnancy), new and worsening shortness of breath. On her review of chart appears to have gestational thrombocytopenia, however LFT abnormalities  are new. At this point concern for possible developing pre-eclampsia. After discussion with Dr. Reina Fuse plan is to place patient in observation overnight and reassess in the AM.    Dispo: admit to antepartum. Care turned over to private physician.   Oscar La, Medical Student 12/28/22  6:15 PM    ATTENDING ATTESTATION  I have seen and examined this patient and agree with the above documentation in the medical student's note except as below.  I have edited the above note for accuracy and clarity  Venora Maples, MD/[facility] (Faculty Practice) 12/28/2022, 8:45 PM

## 2022-12-28 NOTE — MAU Note (Signed)
.  Carmen Wall is a 32 y.o. at [redacted]w[redacted]d here in MAU reporting: sent by office for SOB that started two days ago. Denies any other accompanying symptoms. The SOB has been constant and she does notice worsening SOB and wheezing with increased activity. She also started having a sharp, shooting pain in her left arm that also started 2-3 days ago.  PIH Assessment: Headache present: No  Visual disturbances: None RUQ pain/Epigastric: None Atypical edema:  new swelling in her hands Hx of HBP:  Hx of Pre-E with G2 BP Medications: bASA  LMP: N/A Onset of complaint: 2-3 days ago Pain score: 4/10 left arm, 3/10 ctx Vitals:   12/28/22 1713  BP: (!) 140/68  Pulse: 78  Resp: (!) 22  Temp: 98.4 F (36.9 C)  SpO2: 97%     FHT:140 external Lab orders placed from triage:  UA

## 2022-12-29 ENCOUNTER — Inpatient Hospital Stay (HOSPITAL_COMMUNITY): Payer: Managed Care, Other (non HMO) | Admitting: Anesthesiology

## 2022-12-29 ENCOUNTER — Ambulatory Visit: Payer: Managed Care, Other (non HMO) | Attending: Obstetrics

## 2022-12-29 ENCOUNTER — Encounter (HOSPITAL_COMMUNITY): Payer: Self-pay | Admitting: Obstetrics and Gynecology

## 2022-12-29 DIAGNOSIS — O1092 Unspecified pre-existing hypertension complicating childbirth: Secondary | ICD-10-CM | POA: Diagnosis not present

## 2022-12-29 DIAGNOSIS — O99214 Obesity complicating childbirth: Secondary | ICD-10-CM | POA: Diagnosis not present

## 2022-12-29 DIAGNOSIS — O4593 Premature separation of placenta, unspecified, third trimester: Secondary | ICD-10-CM | POA: Diagnosis not present

## 2022-12-29 DIAGNOSIS — M79603 Pain in arm, unspecified: Secondary | ICD-10-CM | POA: Diagnosis not present

## 2022-12-29 DIAGNOSIS — R0600 Dyspnea, unspecified: Secondary | ICD-10-CM | POA: Diagnosis not present

## 2022-12-29 DIAGNOSIS — D509 Iron deficiency anemia, unspecified: Secondary | ICD-10-CM | POA: Diagnosis not present

## 2022-12-29 DIAGNOSIS — R200 Anesthesia of skin: Secondary | ICD-10-CM | POA: Diagnosis not present

## 2022-12-29 DIAGNOSIS — R0602 Shortness of breath: Secondary | ICD-10-CM | POA: Diagnosis not present

## 2022-12-29 DIAGNOSIS — Z3A37 37 weeks gestation of pregnancy: Secondary | ICD-10-CM | POA: Diagnosis not present

## 2022-12-29 DIAGNOSIS — O26893 Other specified pregnancy related conditions, third trimester: Secondary | ICD-10-CM | POA: Diagnosis not present

## 2022-12-29 DIAGNOSIS — O9902 Anemia complicating childbirth: Secondary | ICD-10-CM | POA: Diagnosis not present

## 2022-12-29 DIAGNOSIS — R030 Elevated blood-pressure reading, without diagnosis of hypertension: Secondary | ICD-10-CM | POA: Diagnosis present

## 2022-12-29 DIAGNOSIS — Z7982 Long term (current) use of aspirin: Secondary | ICD-10-CM | POA: Diagnosis not present

## 2022-12-29 LAB — CBC
Hemoglobin: 11.2 g/dL — ABNORMAL LOW (ref 12.0–15.0)
MCHC: 33.4 g/dL (ref 30.0–36.0)
MCV: 88.6 fL (ref 80.0–100.0)
Platelets: 155 10*3/uL (ref 150–400)
RBC: 3.78 MIL/uL — ABNORMAL LOW (ref 3.87–5.11)
RDW: 14.2 % (ref 11.5–15.5)
nRBC: 0 % (ref 0.0–0.2)

## 2022-12-29 LAB — COMPREHENSIVE METABOLIC PANEL
ALT: 17 U/L (ref 0–44)
AST: 16 U/L (ref 15–41)
Albumin: 2.1 g/dL — ABNORMAL LOW (ref 3.5–5.0)
Alkaline Phosphatase: 127 U/L — ABNORMAL HIGH (ref 38–126)
Anion gap: 14 (ref 5–15)
BUN: 9 mg/dL (ref 6–20)
CO2: 21 mmol/L — ABNORMAL LOW (ref 22–32)
Calcium: 8.8 mg/dL — ABNORMAL LOW (ref 8.9–10.3)
Chloride: 102 mmol/L (ref 98–111)
Creatinine, Ser: 0.66 mg/dL (ref 0.44–1.00)
GFR, Estimated: >60 mL/min (ref >60)
Glucose, Bld: 101 mg/dL — ABNORMAL HIGH (ref 70–99)
Potassium: 3.8 mmol/L (ref 3.5–5.1)
Sodium: 137 mmol/L (ref 135–145)
Total Bilirubin: 0.1 mg/dL — ABNORMAL LOW (ref 0.3–1.2)
Total Protein: 5.2 g/dL — ABNORMAL LOW (ref 6.5–8.1)

## 2022-12-29 LAB — RPR: RPR Ser Ql: NONREACTIVE

## 2022-12-29 MED ORDER — ACETAMINOPHEN 325 MG PO TABS: 650.0000 mg | ORAL_TABLET | ORAL | Status: DC | PRN

## 2022-12-29 MED ORDER — COCONUT OIL OIL: 1.0000 | TOPICAL_OIL | Status: DC | PRN

## 2022-12-29 MED ORDER — PRENATAL MULTIVITAMIN CH
1.0000 | ORAL_TABLET | Freq: Every day | ORAL | Status: DC
  Administered 2022-12-30 – 2022-12-31 (×2): 1 via ORAL
  Filled 2022-12-29 (×2): qty 1

## 2022-12-29 MED ORDER — LACTATED RINGERS IV SOLN
500.0000 mL | INTRAVENOUS | Status: DC | PRN
  Administered 2022-12-29: 500 mL via INTRAVENOUS

## 2022-12-29 MED ORDER — OXYCODONE HCL 5 MG PO TABS
5.0000 mg | ORAL_TABLET | ORAL | Status: DC | PRN
  Administered 2022-12-30 – 2022-12-31 (×3): 5 mg via ORAL
  Filled 2022-12-29 (×3): qty 1

## 2022-12-29 MED ORDER — FENTANYL CITRATE (PF) 100 MCG/2ML IJ SOLN
INTRAMUSCULAR | Status: AC
  Filled 2022-12-29: qty 2

## 2022-12-29 MED ORDER — OXYTOCIN-SODIUM CHLORIDE 30-0.9 UT/500ML-% IV SOLN: 2.5000 [IU]/h | INTRAVENOUS | Status: DC | PRN

## 2022-12-29 MED ORDER — FENTANYL CITRATE (PF) 100 MCG/2ML IJ SOLN: 100.0000 ug | Freq: Once | INTRAMUSCULAR | Status: DC

## 2022-12-29 MED ORDER — BENZOCAINE-MENTHOL 20-0.5 % EX AERO
1.0000 | INHALATION_SPRAY | CUTANEOUS | Status: DC | PRN
  Filled 2022-12-29: qty 56

## 2022-12-29 MED ORDER — LIDOCAINE-EPINEPHRINE (PF) 2 %-1:200000 IJ SOLN
INTRAMUSCULAR | Status: DC | PRN
  Administered 2022-12-29: 12 mL via EPIDURAL

## 2022-12-29 MED ORDER — SOD CITRATE-CITRIC ACID 500-334 MG/5ML PO SOLN: 30.0000 mL | ORAL | Status: DC | PRN

## 2022-12-29 MED ORDER — PHENYLEPHRINE 80 MCG/ML (10ML) SYRINGE FOR IV PUSH (FOR BLOOD PRESSURE SUPPORT): 80.0000 ug | PREFILLED_SYRINGE | INTRAVENOUS | Status: DC | PRN

## 2022-12-29 MED ORDER — FENTANYL CITRATE (PF) 100 MCG/2ML IJ SOLN: 50.0000 ug | INTRAMUSCULAR | Status: DC | PRN

## 2022-12-29 MED ORDER — OXYCODONE-ACETAMINOPHEN 5-325 MG PO TABS: 1.0000 | ORAL_TABLET | ORAL | Status: DC | PRN

## 2022-12-29 MED ORDER — ACETAMINOPHEN 325 MG PO TABS
650.0000 mg | ORAL_TABLET | ORAL | Status: DC | PRN
  Administered 2022-12-30 – 2022-12-31 (×2): 650 mg via ORAL
  Filled 2022-12-29 (×2): qty 2

## 2022-12-29 MED ORDER — FENTANYL CITRATE (PF) 100 MCG/2ML IJ SOLN
100.0000 ug | Freq: Once | INTRAMUSCULAR | Status: AC
  Administered 2022-12-29: 100 ug via INTRAVENOUS
  Filled 2022-12-29: qty 2

## 2022-12-29 MED ORDER — EPHEDRINE 5 MG/ML INJ
10.0000 mg | INTRAVENOUS | Status: DC | PRN
  Filled 2022-12-29: qty 5

## 2022-12-29 MED ORDER — METHYLERGONOVINE MALEATE 0.2 MG PO TABS: 0.2000 mg | ORAL_TABLET | ORAL | Status: DC | PRN

## 2022-12-29 MED ORDER — METHYLERGONOVINE MALEATE 0.2 MG/ML IJ SOLN: 0.2000 mg | INTRAMUSCULAR | Status: DC | PRN

## 2022-12-29 MED ORDER — ONDANSETRON HCL 4 MG PO TABS: 4.0000 mg | ORAL_TABLET | ORAL | Status: DC | PRN

## 2022-12-29 MED ORDER — WITCH HAZEL-GLYCERIN EX PADS: 1.0000 | MEDICATED_PAD | CUTANEOUS | Status: DC | PRN

## 2022-12-29 MED ORDER — ONDANSETRON HCL 4 MG/2ML IJ SOLN: 4.0000 mg | Freq: Four times a day (QID) | INTRAMUSCULAR | Status: DC | PRN

## 2022-12-29 MED ORDER — LACTATED RINGERS IV SOLN
500.0000 mL | Freq: Once | INTRAVENOUS | Status: AC
  Administered 2022-12-29: 500 mL via INTRAVENOUS

## 2022-12-29 MED ORDER — EPHEDRINE 5 MG/ML INJ: 10.0000 mg | INTRAVENOUS | Status: DC | PRN

## 2022-12-29 MED ORDER — LIDOCAINE HCL (PF) 1 % IJ SOLN: 30.0000 mL | INTRAMUSCULAR | Status: DC | PRN

## 2022-12-29 MED ORDER — OXYCODONE HCL 5 MG PO TABS: 10.0000 mg | ORAL_TABLET | ORAL | Status: DC | PRN

## 2022-12-29 MED ORDER — DIPHENHYDRAMINE HCL 25 MG PO CAPS: 25.0000 mg | ORAL_CAPSULE | Freq: Four times a day (QID) | ORAL | Status: DC | PRN

## 2022-12-29 MED ORDER — PHENYLEPHRINE 80 MCG/ML (10ML) SYRINGE FOR IV PUSH (FOR BLOOD PRESSURE SUPPORT)
80.0000 ug | PREFILLED_SYRINGE | INTRAVENOUS | Status: DC | PRN
  Filled 2022-12-29: qty 10

## 2022-12-29 MED ORDER — DIPHENHYDRAMINE HCL 50 MG/ML IJ SOLN: 12.5000 mg | INTRAMUSCULAR | Status: DC | PRN

## 2022-12-29 MED ORDER — LACTATED RINGERS IV SOLN: INTRAVENOUS | Status: DC

## 2022-12-29 MED ORDER — BUPIVACAINE HCL (PF) 0.25 % IJ SOLN
INTRAMUSCULAR | Status: DC | PRN
  Administered 2022-12-29: 1.2 mL via PERINEURAL

## 2022-12-29 MED ORDER — DIBUCAINE (PERIANAL) 1 % EX OINT: 1.0000 | TOPICAL_OINTMENT | CUTANEOUS | Status: DC | PRN

## 2022-12-29 MED ORDER — ONDANSETRON HCL 4 MG/2ML IJ SOLN: 4.0000 mg | INTRAMUSCULAR | Status: DC | PRN

## 2022-12-29 MED ORDER — SIMETHICONE 80 MG PO CHEW: 80.0000 mg | CHEWABLE_TABLET | ORAL | Status: DC | PRN

## 2022-12-29 MED ORDER — ZOLPIDEM TARTRATE 5 MG PO TABS: 5.0000 mg | ORAL_TABLET | Freq: Every evening | ORAL | Status: DC | PRN

## 2022-12-29 MED ORDER — OXYCODONE-ACETAMINOPHEN 5-325 MG PO TABS: 2.0000 | ORAL_TABLET | ORAL | Status: DC | PRN

## 2022-12-29 MED ORDER — FENTANYL-BUPIVACAINE-NACL 0.5-0.125-0.9 MG/250ML-% EP SOLN
12.0000 mL/h | EPIDURAL | Status: DC | PRN
  Filled 2022-12-29: qty 250

## 2022-12-29 MED ORDER — OXYTOCIN BOLUS FROM INFUSION
333.0000 mL | Freq: Once | INTRAVENOUS | Status: AC
  Administered 2022-12-29: 333 mL via INTRAVENOUS

## 2022-12-29 MED ORDER — IBUPROFEN 600 MG PO TABS
600.0000 mg | ORAL_TABLET | Freq: Four times a day (QID) | ORAL | Status: DC
  Administered 2022-12-29 – 2022-12-31 (×8): 600 mg via ORAL
  Filled 2022-12-29 (×8): qty 1

## 2022-12-29 MED ORDER — SENNOSIDES-DOCUSATE SODIUM 8.6-50 MG PO TABS
2.0000 | ORAL_TABLET | Freq: Every day | ORAL | Status: DC
  Administered 2022-12-30: 2 via ORAL
  Filled 2022-12-29: qty 2

## 2022-12-29 MED ORDER — TERBUTALINE SULFATE 1 MG/ML IJ SOLN: 0.2500 mg | Freq: Once | INTRAMUSCULAR | Status: DC | PRN

## 2022-12-29 MED ORDER — OXYTOCIN-SODIUM CHLORIDE 30-0.9 UT/500ML-% IV SOLN
2.5000 [IU]/h | INTRAVENOUS | Status: DC
  Administered 2022-12-29: 2.5 [IU]/h via INTRAVENOUS

## 2022-12-29 MED ORDER — OXYTOCIN-SODIUM CHLORIDE 30-0.9 UT/500ML-% IV SOLN
1.0000 m[IU]/min | INTRAVENOUS | Status: DC
  Administered 2022-12-29: 2 m[IU]/min via INTRAVENOUS
  Filled 2022-12-29: qty 500

## 2022-12-29 MED ORDER — TETANUS-DIPHTH-ACELL PERTUSSIS 5-2.5-18.5 LF-MCG/0.5 IM SUSY: 0.5000 mL | PREFILLED_SYRINGE | Freq: Once | INTRAMUSCULAR | Status: DC

## 2022-12-29 NOTE — MAU Note (Signed)
This RN called by Franchot Gallo, RN, requesting to check patient's cervix as she was unable to reach the cervix. SVE 2.550-3 and undeterminable. This RN performed bedside US to confirm presentation.  Pt informed that the ultrasound is considered a limited OB ultrasound and is not intended to be a complete ultrasound exam.  Patient also informed that the ultrasound is not being completed with the intent of assessing for fetal or placental anomalies or any pelvic abnormalities.  Explained that the purpose of today's ultrasound is to assess for presentation.  Patient acknowledges the purpose of the exam and the limitations of the study.    Vertex presentation confirmed by [place].

## 2022-12-29 NOTE — Anesthesia Preprocedure Evaluation (Addendum)
Anesthesia Evaluation  Patient identified by MRN, date of birth, ID band Patient awake    Reviewed: Allergy & Precautions, Patient's Chart, lab work & pertinent test results  Airway Mallampati: III  TM Distance: >3 FB Neck ROM: Full    Dental no notable dental hx.    Pulmonary asthma    Pulmonary exam normal breath sounds clear to auscultation       Cardiovascular hypertension (GHTN), Normal cardiovascular exam Rhythm:Regular Rate:Normal     Neuro/Psych  Headaches PSYCHIATRIC DISORDERS Anxiety Depression    Chronic migraines, tension HAs Chronic neck pain    GI/Hepatic negative GI ROS, Neg liver ROS,,,  Endo/Other    Morbid obesityBMI 53  Renal/GU negative Renal ROS  negative genitourinary   Musculoskeletal  (+) Arthritis ,  Chronic R knee pain Chronic neck pain Chronic R ankle pain Chronic back pain R piriformis syn Chronic fatigue    Abdominal  (+) + obese  Peds negative pediatric ROS (+)  Hematology  (+) Blood dyscrasia, anemia Hb 11.2, plt 155   Anesthesia Other Findings   Reproductive/Obstetrics (+) Pregnancy                             Anesthesia Physical Anesthesia Plan  ASA: 3  Anesthesia Plan: Combined Spinal and Epidural   Post-op Pain Management:    Induction:   PONV Risk Score and Plan: 2  Airway Management Planned: Natural Airway  Additional Equipment: None  Intra-op Plan:   Post-operative Plan:   Informed Consent: I have reviewed the patients History and Physical, chart, labs and discussed the procedure including the risks, benefits and alternatives for the proposed anesthesia with the patient or authorized representative who has indicated his/her understanding and acceptance.       Plan Discussed with:   Anesthesia Plan Comments: (3rd baby, advanced dilation on arrival)       Anesthesia Quick Evaluation

## 2022-12-29 NOTE — Lactation Note (Signed)
This note was copied from a baby's chart. Lactation Consultation Note  Patient Name: Boy Layloni Fahrner [id number] Date: 12/29/2022 Age:34 hours  Mom chooses to formula feed.  Maternal Data    Feeding Nipple Type: Slow - flow  LATCH Score                    Lactation Tools Discussed/Used    Interventions    Discharge    Consult Status Consult Status: Complete    Mckinnley Smithey G 12/29/2022, 7:37 PM

## 2022-12-29 NOTE — Progress Notes (Signed)
Called Dr. Tenny Craw regarding blood pressures upon admission and one hour check to mother baby. Dr. Tenny Craw ordered to call for blood pressures 160/110 or greater. Earl Gala, Linda Hedges [place]

## 2022-12-29 NOTE — Anesthesia Procedure Notes (Addendum)
Epidural Patient location during procedure: OB Start time: 12/29/2022 10:23 AM End time: 12/29/2022 10:38 AM  Staffing Anesthesiologist: Lannie Fields, DO Performed: anesthesiologist   Preanesthetic Checklist Completed: patient identified, IV checked, risks and benefits discussed, monitors and equipment checked, pre-op evaluation and timeout performed  Epidural Patient position: sitting Prep: DuraPrep and site prepped and draped Patient monitoring: continuous pulse ox, blood pressure, heart rate and cardiac monitor Approach: midline Location: L3-L4 Injection technique: LOR air  Needle:  Needle type: Tuohy  Needle gauge: 17 G Needle length: 9 cm Needle insertion depth: 8 cm Catheter type: closed end flexible Catheter size: 19 Gauge Catheter at skin depth: 14 cm Test dose: negative  Assessment Sensory level: T8 Events: blood not aspirated, no cerebrospinal fluid, injection not painful, no injection resistance, no paresthesia and negative IV test  Additional Notes Patient identified. Risks/Benefits/Options discussed with patient including but not limited to bleeding, infection, nerve damage, paralysis, failed block, incomplete pain control, headache, blood pressure changes, nausea, vomiting, reactions to medication both or allergic, itching and postpartum back pain. Confirmed with bedside nurse the patient's most recent platelet count. Confirmed with patient that they are not currently taking any anticoagulation, have any bleeding history or any family history of bleeding disorders. Patient expressed understanding and wished to proceed. All questions were answered. Sterile technique was used throughout the entire procedure. Please see nursing notes for vital signs. Test dose was given through epidural catheter and negative prior to continuing to dose epidural or start infusion. Warning signs of high block given to the patient including shortness of breath, tingling/numbness in  hands, complete motor block, or any concerning symptoms with instructions to call for help. Patient was given instructions on fall risk and not to get out of bed. All questions and concerns addressed with instructions to call with any issues or inadequate analgesia.    CSE w/ 24G pencan through tuohy, clear CSF no issuesReason for block:procedure for pain

## 2022-12-29 NOTE — Progress Notes (Signed)
Patient ID: Carmen Wall, female   DOB: [birth year], 32 y.o.   MRN: [id number]  S: Patient complaining of painful contractions.  Requested pain medication.  Initial cervical exam by RN was 2 cm /50/-3. O:  Vitals:   12/29/22 0416 12/29/22 0803 12/29/22 0811 12/29/22 0950  BP: 127/65 134/71  (!) 153/94  Pulse: 82 64  70  Resp: 16  18   Temp: 98.1 F (36.7 C)  (!) 97.4 F (36.3 C)   TempSrc: Oral  Oral   SpO2: 99% 93%    Weight:      Height:       Alert and oriented x 3, uncomfortable appearing Obese, gravid, nontender Fetal heart rate 130s, moderate variability, with late appearing decelerations after the last few contractions. Cervix 3-4/70/-2 Toco every 2 to 3 minutes  Assessment and plan:  Patient now appears to be laboring.  Will transfer to labor and delivery. GBS negative Epidural on request

## 2022-12-30 NOTE — Anesthesia Postprocedure Evaluation (Signed)
Anesthesia Post Note  Patient: Ali Harralson McPherson-Clarke  Procedure(s) Performed: AN AD HOC LABOR EPIDURAL     Patient location during evaluation: Mother Baby Anesthesia Type: Epidural Level of consciousness: awake and alert Pain management: pain level controlled Vital Signs Assessment: post-procedure vital signs reviewed and stable Respiratory status: spontaneous breathing, nonlabored ventilation and respiratory function stable Cardiovascular status: stable Postop Assessment: no headache, no backache, epidural receding, no apparent nausea or vomiting, patient able to bend at knees, able to ambulate and adequate PO intake Anesthetic complications: no   No notable events documented.  Last Vitals:  Vitals:   12/30/22 0315 12/30/22 0645  BP: 134/75 130/69  Pulse: 82 69  Resp: 18 17  Temp: 36.5 C 36.8 C  SpO2: 96% 97%    Last Pain:  Vitals:   12/30/22 0645  TempSrc: Oral  PainSc: 5    Pain Goal: Patients Stated Pain Goal: 0 (12/30/22 0156)                 [name]

## 2022-12-30 NOTE — Progress Notes (Signed)
Post Partum Day 1 Subjective: Patient is doing well this morning. Pain is controlled. Ambulating, voiding, tolerating PO. Minimal lochia. Breastfeeding.   Objective: Patient Vitals for the past 24 hrs:  BP Temp Temp src Pulse Resp SpO2  12/30/22 0645 130/69 98.2 F (36.8 C) Oral 69 17 97 %  12/30/22 0315 134/75 97.7 F (36.5 C) Oral 82 18 96 %  12/29/22 2315 126/71 98.2 F (36.8 C) Oral 77 17 99 %  12/29/22 1912 137/73 98.5 F (36.9 C) Oral 82 18 98 %  12/29/22 1815 136/65 98.2 F (36.8 C) Oral 95 18 98 %  12/29/22 1732 117/68 -- -- 85 -- --  12/29/22 1717 (!) 146/57 -- -- 80 -- --  12/29/22 1701 136/80 -- -- 80 -- --  12/29/22 1646 138/76 -- -- 74 -- --  12/29/22 1631 133/62 -- -- 75 -- --  12/29/22 1616 (!) 145/82 -- -- 78 -- --  12/29/22 1604 136/69 -- -- 84 -- --  12/29/22 1602 (!) 121/35 -- -- 86 -- --  12/29/22 1553 139/80 -- -- 79 -- --  12/29/22 1531 129/64 -- -- 67 -- --  12/29/22 1501 (!) 143/77 -- -- 64 -- --  12/29/22 1430 (!) 141/85 -- -- 69 -- --  12/29/22 1400 (!) 142/75 -- -- 90 -- --  12/29/22 1344 119/72 -- -- 71 -- --  12/29/22 1240 (!) 122/57 -- -- 70 -- --  12/29/22 1210 -- -- -- -- -- 98 %  12/29/22 1205 -- -- -- -- -- 98 %  12/29/22 1202 (!) 121/59 -- -- 74 -- --  12/29/22 1146 125/68 -- -- 74 -- --  12/29/22 1141 (!) 114/58 -- -- 74 -- --  12/29/22 1136 115/73 -- -- 73 -- --  12/29/22 1135 -- -- -- -- -- 98 %  12/29/22 1132 (!) 118/58 -- -- 68 -- --  12/29/22 1127 (!) 119/50 -- -- 74 -- --  12/29/22 1125 -- -- -- -- -- 98 %  12/29/22 1120 (!) 119/58 -- -- 64 -- 96 %  12/29/22 1115 (!) 116/59 -- -- 76 -- 95 %  12/29/22 1110 (!) 121/58 -- -- 69 -- 94 %  12/29/22 1107 131/64 -- -- 69 -- --  12/29/22 1105 -- -- -- -- -- 96 %  12/29/22 1101 129/63 -- -- (!) 49 -- --  12/29/22 1100 -- -- -- -- -- 97 %  12/29/22 1058 (!) 116/44 -- -- 62 -- --  12/29/22 1055 -- -- -- -- -- 97 %  12/29/22 1052 127/62 -- -- (!) 50 -- --  12/29/22 1050 -- -- -- -- --  93 %  12/29/22 1047 (!) 124/56 -- -- (!) 52 -- --  12/29/22 1045 -- -- -- -- -- 98 %  12/29/22 1043 (!) 116/59 -- -- 75 -- --  12/29/22 1005 111/67 -- -- 63 -- 100 %  12/29/22 0950 (!) 153/94 -- -- 70 -- --    Physical Exam:  General: alert, cooperative, and no distress Lochia: appropriate Uterine Fundus: firm DVT Evaluation: No evidence of DVT seen on physical exam.  Recent Labs    12/28/22 1830 12/29/22 0503 12/30/22 0614  WBC 6.6 6.3 7.2  HGB 13.1 11.2* 10.8*  HCT 38.4 33.5* 32.9*  PLT 141* 155 124*    Recent Labs    12/28/22 1830 12/29/22 0503  NA 138 137  K 4.1 3.8  CL 103 102  BUN 7 9  CREATININE 0.56 0.66  GLUCOSE 73 101*  BILITOT 0.8 0.1*  ALT 61* 17  AST 54* 16  ALKPHOS 159* 127*  PROT 6.0* 5.2*  ALBUMIN 2.5* 2.1*    Recent Labs    12/28/22 1830 12/29/22 0503  CALCIUM 9.0 8.8*    No results for input(s): "PROTIME", "APTT", "INR" in the last 72 hours.  No results for input(s): "PROTIME", "APTT", "INR", "FIBRINOGEN" in the last 72 hours. Assessment/Plan: Carmen Wall 32 y.o. G3P3003 PPD#1 sp VAVD 1. PPC: routine PP care 2. GHTN: normotensive since delivery, admission labs normal 3. Desires neonatal circumcision, R/B/A of procedure discussed at length. Pt understands that neonatal circumcision is not considered medically necessary and is elective. The risks include, but are not limited to bleeding, infection, damage to the penis, development of scar tissue, and having to have it redone at a later date. Pt understands theses risks and wishes to proceed. Awaiting void.  4. Dispo: anticipate discharge home tomorrow with BP check in office next week    LOS: 1 day   Charlett Nose 12/30/2022, 8:40 AM

## 2022-12-30 NOTE — Social Work (Signed)
MOB was referred for history of depression/anxiety.  * Referral screened out by Clinical Social Worker because none of the following criteria appear to apply:  ~ History of anxiety/depression during this pregnancy, or of post-partum depression following prior delivery.  ~ Diagnosis of anxiety and/or depression within last 3 years OR * MOB's symptoms currently being treated with medication and/or therapy.  Per chart review, MOB diagnosis dates back to 2020. Per OB records no noted concerns during pregnancy.   Please contact the Clinical Social Worker if needs arise, or by MOB request.   Wende Neighbors, LCSWA Clinical Social Worker [phone]

## 2022-12-31 MED ORDER — IBUPROFEN 600 MG PO TABS: 600.0000 mg | ORAL_TABLET | Freq: Four times a day (QID) | ORAL | 0 refills | Status: DC

## 2022-12-31 MED ORDER — OXYCODONE HCL 5 MG PO TABS: 5.0000 mg | ORAL_TABLET | Freq: Four times a day (QID) | ORAL | 0 refills | Status: DC | PRN

## 2022-12-31 NOTE — Progress Notes (Signed)
PPD #2 Doing well, some pain low abdomen Afeb, VSS Abd- fundus firm at U+1 D/c home

## 2022-12-31 NOTE — Discharge Summary (Signed)
Postpartum Discharge Summary      Patient Name: Carmen Wall DOB: 1990/09/07 MRN: [id number]  Date of admission: 12/28/2022 Delivery date:12/29/2022 Delivering provider: Waynard Reeds Date of discharge: 12/31/2022  Admitting diagnosis: Chronic hypertension affecting pregnancy [O10.919] Normal labor [O80, Z37.9] Vacuum-assisted vaginal delivery [Z37.9] Intrauterine pregnancy: [redacted]w[redacted]d     Secondary diagnosis:  Principal Problem:   Chronic hypertension affecting pregnancy Active Problems:   Normal labor   Vacuum-assisted vaginal delivery     Discharge diagnosis: Term Pregnancy Delivered and [facility] course: Onset of Labor With Vaginal Delivery      32 y.o. yo G3P3003 at [redacted]w[redacted]d was admitted with elevated LFTs and intermittent elevated BP for observation.  While admitted she went into Latent Labor on 12/28/2022. Labor course was complicated by nothing  Membrane Rupture Time/Date: 10:52 AM,12/29/2022  Delivery Method:Vaginal, Vacuum (Extractor) Episiotomy: None Lacerations:  None Patient had a postpartum course complicated by nothing, BP stable without medication.  She is ambulating, tolerating a regular diet, passing flatus, and urinating well. Patient is discharged home in stable condition on 12/31/22.  Newborn Data: Birth date:12/29/2022 Birth time:3:49 PM Gender:Female Living status:Living Apgars:8 ,9  Weight:3320 g  Physical exam  Vitals:   12/30/22 0645 12/30/22 1306 12/30/22 2100 12/31/22 0515  BP: 130/69 136/80 125/65 127/76  Pulse: 69 74 93 63  Resp: 17 18 17 17   Temp: 98.2 F (36.8 C) 98 F (36.7 C) 97.8 F (36.6 C) 97.9 F (36.6 C)  TempSrc: Oral Oral Oral Oral  SpO2: 97% 99% 100% 97%  Weight:      Height:       General: alert Lochia: appropriate Uterine Fundus: firm  Labs: Lab Results  Component Value Date   WBC 7.2 12/30/2022   HGB 10.8 (L) 12/30/2022   HCT 32.9 (L) 12/30/2022   MCV  90.1 12/30/2022   PLT 124 (L) 12/30/2022      Latest Ref Rng & Units 12/29/2022    5:03 AM  CMP  Glucose 70 - 99 mg/dL 403   BUN 6 - 20 mg/dL 9   Creatinine 4.74 - 2.59 mg/dL 5.63   Sodium 875 - 643 mmol/L 137   Potassium 3.5 - 5.1 mmol/L 3.8   Chloride 98 - 111 mmol/L 102   CO2 22 - 32 mmol/L 21   Calcium 8.9 - 10.3 mg/dL 8.8   Total Protein 6.5 - 8.1 g/dL 5.2   Total Bilirubin 0.3 - 1.2 mg/dL 0.1   Alkaline Phos 38 - 126 U/L 127   AST 15 - 41 U/L 16   ALT 0 - 44 U/L 17    Edinburgh Score:    12/29/2022    6:15 PM  Edinburgh Postnatal Depression Scale Screening Tool  I have been able to laugh and see the funny side of things. 0  I have looked forward with enjoyment to things. 0  I have blamed myself unnecessarily when things went wrong. 1  I have been anxious or worried for no good reason. 2  I have felt scared or panicky for no good reason. 1  Things have been getting on top of me. 0  I have been so unhappy that I have had difficulty sleeping. 0  I  have felt sad or miserable. 0  I have been so unhappy that I have been crying. 0  The thought of harming myself has occurred to me. 0  Edinburgh Postnatal Depression Scale Total 4      After visit meds:  Allergies as of 12/31/2022   No Known Allergies      Medication List     STOP taking these medications    amitriptyline 10 MG tablet Commonly known as: ELAVIL   aspirin EC 81 MG tablet   cyclobenzaprine 5 MG tablet Commonly known as: FLEXERIL   ursodiol 300 MG capsule Commonly known as: ACTIGALL       TAKE these medications    albuterol 108 (90 Base) MCG/ACT inhaler Commonly known as: VENTOLIN HFA Inhale 1 puff into the lungs every 6 (six) hours as needed for wheezing or shortness of breath.   ibuprofen 600 MG tablet Commonly known as: ADVIL Take 1 tablet (600 mg total) by mouth every 6 (six) hours.   oxyCODONE 5 MG immediate release tablet Commonly known as: Oxy IR/ROXICODONE Take 1 tablet (5 mg  total) by mouth every 6 (six) hours as needed for severe pain.   PrePLUS 27-1 MG Tabs Take 1 tablet by mouth daily.         Discharge home in stable condition Infant Feeding: Breast Infant Disposition:home with mother Discharge instruction: per After Visit Summary and Postpartum booklet. Activity: Advance as tolerated. Pelvic rest for 6 weeks.  Diet: routine diet Postpartum Appointment:1 week Follow up Visit:  Follow-up Information     Ob/Gyn, Nestor Ramp. Schedule an appointment as soon as possible for a visit in 5 day(s).   Why: for BP check Contact information: [address] [place] [address] [phone]                     12/31/2022 Zenaida Niece, MD

## 2022-12-31 NOTE — Discharge Instructions (Signed)
As per discharge pamphlet °

## 2023-01-04 ENCOUNTER — Other Ambulatory Visit: Payer: Self-pay

## 2023-01-04 ENCOUNTER — Inpatient Hospital Stay (HOSPITAL_COMMUNITY): Payer: Managed Care, Other (non HMO)

## 2023-01-04 ENCOUNTER — Observation Stay (HOSPITAL_COMMUNITY)
Admission: AD | Admit: 2023-01-04 | Discharge: 2023-01-05 | Disposition: A | Discharge status: Home / Self Care | Payer: Managed Care, Other (non HMO) | Attending: Obstetrics | Admitting: Obstetrics

## 2023-01-04 ENCOUNTER — Encounter (HOSPITAL_COMMUNITY): Payer: Self-pay | Admitting: Obstetrics

## 2023-01-04 DIAGNOSIS — O99893 Other specified diseases and conditions complicating puerperium: Principal | ICD-10-CM | POA: Insufficient documentation

## 2023-01-04 DIAGNOSIS — R079 Chest pain, unspecified: Secondary | ICD-10-CM | POA: Diagnosis present

## 2023-01-04 DIAGNOSIS — R0602 Shortness of breath: Secondary | ICD-10-CM | POA: Diagnosis not present

## 2023-01-04 LAB — COMPREHENSIVE METABOLIC PANEL
ALT: 34 U/L (ref 0–44)
AST: 25 U/L (ref 15–41)
Albumin: 2.4 g/dL — ABNORMAL LOW (ref 3.5–5.0)
Alkaline Phosphatase: 93 U/L (ref 38–126)
Anion gap: 7 (ref 5–15)
BUN: 19 mg/dL (ref 6–20)
CO2: 27 mmol/L (ref 22–32)
Calcium: 8.7 mg/dL — ABNORMAL LOW (ref 8.9–10.3)
Chloride: 106 mmol/L (ref 98–111)
Creatinine, Ser: 0.81 mg/dL (ref 0.44–1.00)
GFR, Estimated: >60 mL/min (ref >60)
Glucose, Bld: 94 mg/dL (ref 70–99)
Potassium: 4.6 mmol/L (ref 3.5–5.1)
Sodium: 140 mmol/L (ref 135–145)
Total Bilirubin: 0.4 mg/dL (ref 0.3–1.2)
Total Protein: 5.2 g/dL — ABNORMAL LOW (ref 6.5–8.1)

## 2023-01-04 LAB — CBC
HCT: 34.9 % — ABNORMAL LOW (ref 36.0–46.0)
Hemoglobin: 11.6 g/dL — ABNORMAL LOW (ref 12.0–15.0)
MCH: 30.9 pg (ref 26.0–34.0)
MCHC: 33.2 g/dL (ref 30.0–36.0)
MCV: 92.8 fL (ref 80.0–100.0)
Platelets: 180 10*3/uL (ref 150–400)
RBC: 3.76 MIL/uL — ABNORMAL LOW (ref 3.87–5.11)
RDW: 13.8 % (ref 11.5–15.5)
WBC: 5.6 10*3/uL (ref 4.0–10.5)
nRBC: 0 % (ref 0.0–0.2)

## 2023-01-04 LAB — TYPE AND SCREEN
ABO/RH(D): A POS
Antibody Screen: NEGATIVE

## 2023-01-04 LAB — TROPONIN I (HIGH SENSITIVITY)
Troponin I (High Sensitivity): 4 ng/L (ref <18)
Troponin I (High Sensitivity): 6 ng/L (ref <18)

## 2023-01-04 LAB — BRAIN NATRIURETIC PEPTIDE: B Natriuretic Peptide: 278.3 pg/mL — ABNORMAL HIGH (ref 0.0–100.0)

## 2023-01-04 MED ORDER — AMLODIPINE BESYLATE 5 MG PO TABS
5.0000 mg | ORAL_TABLET | Freq: Every day | ORAL | Status: DC
  Administered 2023-01-05: 5 mg via ORAL
  Filled 2023-01-04: qty 1

## 2023-01-04 MED ORDER — DIPHENHYDRAMINE HCL 25 MG PO CAPS
25.0000 mg | ORAL_CAPSULE | Freq: Every evening | ORAL | Status: DC | PRN
  Administered 2023-01-04: 25 mg via ORAL
  Filled 2023-01-04: qty 1

## 2023-01-04 MED ORDER — NIFEDIPINE 10 MG PO CAPS
20.0000 mg | ORAL_CAPSULE | ORAL | Status: DC | PRN
  Filled 2023-01-04: qty 2

## 2023-01-04 MED ORDER — LABETALOL HCL 5 MG/ML IV SOLN: 40.0000 mg | INTRAVENOUS | Status: DC | PRN

## 2023-01-04 MED ORDER — LACTATED RINGERS IV SOLN: 125.0000 mL/h | INTRAVENOUS | Status: DC

## 2023-01-04 MED ORDER — IBUPROFEN 600 MG PO TABS
600.0000 mg | ORAL_TABLET | Freq: Four times a day (QID) | ORAL | Status: DC | PRN
  Administered 2023-01-04 – 2023-01-05 (×2): 600 mg via ORAL
  Filled 2023-01-04 (×2): qty 1

## 2023-01-04 MED ORDER — ACETAMINOPHEN 325 MG PO TABS
650.0000 mg | ORAL_TABLET | ORAL | Status: DC | PRN
  Administered 2023-01-04 – 2023-01-05 (×2): 650 mg via ORAL
  Filled 2023-01-04 (×2): qty 2

## 2023-01-04 MED ORDER — NIFEDIPINE 10 MG PO CAPS
20.0000 mg | ORAL_CAPSULE | ORAL | Status: DC | PRN
  Administered 2023-01-04: 20 mg via ORAL

## 2023-01-04 MED ORDER — FUROSEMIDE 20 MG PO TABS
20.0000 mg | ORAL_TABLET | Freq: Every day | ORAL | Status: DC
  Administered 2023-01-04 – 2023-01-05 (×2): 20 mg via ORAL
  Filled 2023-01-04 (×3): qty 1

## 2023-01-04 MED ORDER — NIFEDIPINE ER OSMOTIC RELEASE 30 MG PO TB24: 30.0000 mg | ORAL_TABLET | Freq: Every day | ORAL | Status: DC

## 2023-01-04 MED ORDER — NIFEDIPINE 10 MG PO CAPS
10.0000 mg | ORAL_CAPSULE | ORAL | Status: DC | PRN
  Administered 2023-01-04: 10 mg via ORAL
  Filled 2023-01-04: qty 1

## 2023-01-04 MED ORDER — FAMOTIDINE 20 MG PO TABS
20.0000 mg | ORAL_TABLET | Freq: Two times a day (BID) | ORAL | Status: DC | PRN
  Administered 2023-01-04: 20 mg via ORAL
  Filled 2023-01-04: qty 1

## 2023-01-04 MED ORDER — PRENATAL MULTIVITAMIN CH
1.0000 | ORAL_TABLET | Freq: Every day | ORAL | Status: DC
  Administered 2023-01-05: 1 via ORAL
  Filled 2023-01-04: qty 1

## 2023-01-04 MED ORDER — CYCLOBENZAPRINE HCL 5 MG PO TABS
10.0000 mg | ORAL_TABLET | Freq: Once | ORAL | Status: AC
  Administered 2023-01-04: 10 mg via ORAL
  Filled 2023-01-04: qty 2

## 2023-01-04 NOTE — MAU Provider Note (Signed)
History     CSN: [id number]  Arrival date and time: 01/04/23 1539   Event Date/Time   First Provider Initiated Contact with Patient 01/04/23 1610     Chief Complaint  Patient presents with   Chest Pain   Shortness of Breath   Carmen Wall is a 32 y.o. G3P3003 at 6 days PP from a vaginal delivery with a hx of chronic hypertension and cardiomegaly who presents with 2 days of chest pain and shortness of breath. It is central chest pain and feels deep. Her dyspnea is worst with lying down and the chest pain is random and intermittent. She noticed that she had associated elevated BPs at home for 2 days.   OB History     Gravida  3   Para  3   Term  3   Preterm      AB      Living  3      SAB      IAB      Ectopic      Multiple  0   Live Births  3           Past Medical History:  Diagnosis Date   Acute pain of right knee 07/23/2018   Acute right ankle pain 10/24/2018   ADHD    Allergy    Allergy    Anxiety    Anxiety    Arthritis    Arthritis    Back pain    Backache 09/09/2020   Bilateral swelling of feet    Chronic fatigue syndrome    Chronic right-sided low back pain with right-sided sciatica 05/15/2018   Chronic tension-type headache, not intractable 07/04/2022   Constipation    Cough variant asthma 09/26/2019   Depression    Depression 09/05/2017   Excessive daytime sleepiness 03/13/2019   GAD (generalized anxiety disorder) 08/24/2018   IBS (irritable bowel syndrome)    Insomnia 07/04/2022   Joint pain    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   Lactose intolerance    Migraine without aura    Migraine without aura    Neck pain 09/09/2020   Other hyperlipidemia 12/12/2019   Piriformis syndrome of right side 11/11/2019   Radial styloid tenosynovitis 09/09/2020   Restless legs syndrome (RLS) 07/04/2022   Rhinitis, allergic 09/09/2014   Vitamin D deficiency 11/25/2019    Past Surgical History:  Procedure Laterality  Date   INTRAUTERINE DEVICE (IUD) INSERTION  04/2017   Mirena    KNEE ARTHROSCOPY Right    WISDOM TOOTH EXTRACTION      Family History  Problem Relation Age of Onset   Diabetes Father    Arthritis Father    Depression Father    Mental illness Father        PTSD, anxiety   Hypertension Father    Hyperlipidemia Father    Anxiety disorder Father    Bipolar disorder Father    Sleep apnea Father    Alcoholism Father    Obesity Father    Arthritis Mother        osteoartthritis   Fibroids Mother    Ovarian cysts Mother    Obesity Mother    Kidney disease Maternal Grandmother    Arthritis Maternal Grandmother    Prostate cancer Maternal Grandfather    Arthritis Maternal Grandfather    Arthritis Paternal Grandmother    Diabetes Paternal Grandfather    Mental illness Paternal Grandfather    Arthritis Paternal Grandfather  Social History   Tobacco Use   Smoking status: Never   Smokeless tobacco: Never  Vaping Use   Vaping status: Never Used  Substance Use Topics   Alcohol use: No    Alcohol/week: 0.0 standard drinks of alcohol   Drug use: No    Allergies: No Known Allergies  Facility-Administered Medications Prior to Admission  Medication Dose Route Frequency Provider Last Rate Last Admin   triamcinolone cream (KENALOG) 0.1 % cream   Topical TID Katrinka Blazing, [place], CNM       Medications Prior to Admission  Medication Sig Dispense Refill Last Dose   cholecalciferol (VITAMIN D3) 25 MCG (1000 UNIT) tablet Take 1,000 Units by mouth daily.   01/04/2023   ibuprofen (ADVIL) 600 MG tablet Take 1 tablet (600 mg total) by mouth every 6 (six) hours. 30 tablet 0 01/04/2023   magnesium 30 MG tablet Take 30 mg by mouth 2 (two) times daily.   01/04/2023   oxyCODONE (OXY IR/ROXICODONE) 5 MG immediate release tablet Take 1 tablet (5 mg total) by mouth every 6 (six) hours as needed for severe pain. 5 tablet 0 Past Week   Prenatal Vit-Fe Fumarate-FA (PREPLUS) 27-1 MG TABS Take 1 tablet by  mouth daily.   01/03/2023   albuterol (VENTOLIN HFA) 108 (90 Base) MCG/ACT inhaler Inhale 1 puff into the lungs every 6 (six) hours as needed for wheezing or shortness of breath. (Patient not taking: Reported on 10/13/2022) 8 g 0     Review of Systems  Constitutional:  Negative for activity change.  HENT:  Negative for facial swelling.   Eyes:  Negative for visual disturbance.  Respiratory:  Positive for shortness of breath.   Cardiovascular:  Positive for chest pain. Negative for leg swelling.  Gastrointestinal:  Negative for abdominal pain, nausea and vomiting.  Neurological:  Negative for weakness, numbness and headaches.   Physical Exam   Blood pressure (!) 145/70, pulse 95, temperature 98.2 F (36.8 C), temperature source Oral, resp. rate 20, SpO2 99%, not currently breastfeeding.  Physical Exam Vitals and nursing note reviewed.  Constitutional:      General: She is not in acute distress.    Appearance: She is not ill-appearing, toxic-appearing or diaphoretic.  Eyes:     Extraocular Movements: Extraocular movements intact.  Cardiovascular:     Rate and Rhythm: Normal rate and regular rhythm.     Heart sounds: Normal heart sounds.  Pulmonary:     Effort: Pulmonary effort is normal. No tachypnea or respiratory distress.     Breath sounds: Normal breath sounds. No decreased breath sounds, wheezing, rhonchi or rales.  Neurological:     General: No focal deficit present.     Mental Status: She is alert and oriented to person, place, and time.  Psychiatric:        Mood and Affect: Mood normal.        Behavior: Behavior normal.   MAU Course  Procedures  MDM Reviewed MAU visit on 7/24 which was for similar symptoms. Unremarkable EKG. Trops negative. BNP unremarkable. CXR significant for cardiomegaly and pulmonary congestion.   Today BP is elevated with bradycardia. EKG grossly unremarkable. CXR wnl. Suspicion for ACS and postpartum cardiomyopathy is lower but will obtain trops,  BNP. Due to elevated severe range BPx2 requiring oral procardia and hx of chronic hypertension suspicion for postpartum preeclampsia is high. CBC wnl. CMP pending. Well score 3 (ie PE unlikely and 3% incidence of PE).   1800- CMP wnl. BNP significantly elevated 69>278  and upon reassessment LE edema has visibly worsened. Suspicion for postpartum cardiomyopathy is high but differential continues to include pospartum preE/PPHTN.   1810-Discussed the above findings with Dr. Chestine Spore who agreed with admission, BP treatment, and echo in the AM  Today's Vitals   01/04/23 1611 01/04/23 1637 01/04/23 1706 01/04/23 1732  BP: (!) 167/99 (!) 161/75 (!) 140/74 (!) 145/70  Pulse: 60 (!) 56 (!) 102 95  Resp:      Temp:      TempSrc:      SpO2: 97% 98% 99%     Results for orders placed or performed during the hospital encounter of 01/04/23 (from the past 24 hour(s))  Comprehensive metabolic panel     Status: Abnormal   Collection Time: 01/04/23  4:35 PM  Result Value Ref Range   Sodium 140 135 - 145 mmol/L   Potassium 4.6 3.5 - 5.1 mmol/L   Chloride 106 98 - 111 mmol/L   CO2 27 22 - 32 mmol/L   Glucose, Bld 94 70 - 99 mg/dL   BUN 19 6 - 20 mg/dL   Creatinine, Ser 2.84 0.44 - 1.00 mg/dL   Calcium 8.7 (L) 8.9 - 10.3 mg/dL   Total Protein 5.2 (L) 6.5 - 8.1 g/dL   Albumin 2.4 (L) 3.5 - 5.0 g/dL   AST 25 15 - 41 U/L   ALT 34 0 - 44 U/L   Alkaline Phosphatase 93 38 - 126 U/L   Total Bilirubin 0.4 0.3 - 1.2 mg/dL   GFR, Estimated >13 >24 mL/min   Anion gap 7 5 - 15  CBC     Status: Abnormal   Collection Time: 01/04/23  4:35 PM  Result Value Ref Range   WBC 5.6 4.0 - 10.5 K/uL   RBC 3.76 (L) 3.87 - 5.11 MIL/uL   Hemoglobin 11.6 (L) 12.0 - 15.0 g/dL   HCT 40.1 (L) 02.7 - 25.3 %   MCV 92.8 80.0 - 100.0 fL   MCH 30.9 26.0 - 34.0 pg   MCHC 33.2 30.0 - 36.0 g/dL   RDW 66.4 40.3 - 47.4 %   Platelets 180 150 - 400 K/uL   nRBC 0.0 0.0 - 0.2 %  Brain natriuretic peptide     Status: Abnormal    Collection Time: 01/04/23  4:35 PM  Result Value Ref Range   B Natriuretic Peptide 278.3 (H) 0.0 - 100.0 pg/mL  Troponin I (High Sensitivity)     Status: None   Collection Time: 01/04/23  4:35 PM  Result Value Ref Range   Troponin I (High Sensitivity) 4 <18 ng/L   Assessment and Plan  1. Chest pain, unspecified type Carmen Wall is a 32 y.o. G3P3003 at 6 days PP from a vaginal delivery with a hx of chronic hypertension and cardiomegaly w/ pulmonary congestion who presents with 2 days of chest pain and shortness of breath.  BP improved with total of 30 mg of procardia. BNP significantly elevated 69>278 and upon reassessment LE edema has visibly worsened. Otherwise unremarkable work up. Suspicion for postpartum cardiomyopathy is high but differential continues to include pospartum preE/acute on chronic PPHTN.  -Admit to [facility], Dr. Chestine Spore -ECHO -Follow up 2nd trop -Continuous cardiac monitoring -Vital signs per protocol -BP protocol -Procardia 30 mg daily -Lasix 20 mg daily -SCDs, ambulation  Carmen Wall 01/04/2023, 5:42 PM

## 2023-01-04 NOTE — H&P (Signed)
32 y.o. [id number] PPD#6 from a TSVD at 37 weeks who presents with a two day history of chest pain, shortness of breath and elevated blood pressure.  She has a history of chronic hypertension diagnosed after her second pregnancy.  She was on norvasc for a few months in the fall of 2023, but it was discontinued prior to her pregnancy.  Blood pressure was normal throughout this pregnancy until 37 weeks when she presented initially with SOB and was found to have worsening hypertension.  LFTs were mildly elevated, her SOB resolved quickly and she ultimately spontaneously labored.  Blood pressure remained normal following delivery.  Today, patient reports a deep central chest pain that is intermittent, lasts < 1 min with associated SOB.  She reports dyspnea is worse when lying down and not present when upright.  She had some mild SOB when cleaning the house today that quickly resolved with rest.  She reports mild lower extremity swelling that is unchanged from discharge.  She noted that her BP was elevated in the 150s/90s over the last two days and presented for evaluation.  In MAU, EKG with normal sinus rhythm, troponin was normal, but BNP was newly elevated.  CXR was normal without evidence of pulmonary edema or cardiomegaly.  She had multiple severe range blood pressures on admission that responded to procardia 10 mg x 3 doses.        Past Medical History:  Diagnosis Date   Acute pain of right knee 07/23/2018   Acute right ankle pain 10/24/2018   ADHD    Allergy    Allergy    Anxiety    Anxiety    Arthritis    Arthritis    Back pain    Backache 09/09/2020   Bilateral swelling of feet    Chronic fatigue syndrome    Chronic right-sided low back pain with right-sided sciatica 05/15/2018   Chronic tension-type headache, not intractable 07/04/2022   Constipation    Cough variant asthma 09/26/2019   Depression    Depression 09/05/2017   Excessive daytime sleepiness 03/13/2019   GAD (generalized  anxiety disorder) 08/24/2018   IBS (irritable bowel syndrome)    Insomnia 07/04/2022   Joint pain    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   Lactose intolerance    Migraine without aura    Migraine without aura    Neck pain 09/09/2020   Other hyperlipidemia 12/12/2019   Piriformis syndrome of right side 11/11/2019   Radial styloid tenosynovitis 09/09/2020   Restless legs syndrome (RLS) 07/04/2022   Rhinitis, allergic 09/09/2014   Vitamin D deficiency 11/25/2019    Past Surgical History:  Procedure Laterality Date   INTRAUTERINE DEVICE (IUD) INSERTION  04/2017   Mirena    KNEE ARTHROSCOPY Right    WISDOM TOOTH EXTRACTION      OB History  Gravida Para Term Preterm AB Living  3 3 3     3   SAB IAB Ectopic Multiple Live Births        0 3    # Outcome Date GA Lbr Len/2nd Weight Sex Type Anes PTL Lv  3 Term 12/29/22 [redacted]w[redacted]d  3320 g M Vag-Vacuum EPI  LIV  2 Term 07/22/21 [redacted]w[redacted]d  2885 g F Vag-Spont EPI  LIV  1 Term 09/26/11 [redacted]w[redacted]d  3402 g  Vag-Spont   LIV    Social History   Socioeconomic History   Marital status: Married    Spouse name: Not on file   Number of children: Not  on file   Years of education: Not on file   Highest education level: Not on file  Occupational History   Not on file  Tobacco Use   Smoking status: Never   Smokeless tobacco: Never  Vaping Use   Vaping status: Never Used  Substance and Sexual Activity   Alcohol use: No    Alcohol/week: 0.0 standard drinks of alcohol   Drug use: No   Sexual activity: Yes    Partners: Male    Birth control/protection: Pill    Comment: ic 3 weeks ago  Other Topics Concern   Not on file  Social History Narrative   Not on file   Social Determinants of Health   Financial Resource Strain: Not on file  Food Insecurity: No Food Insecurity (01/04/2023)   Hunger Vital Sign    Worried About Running Out of Food in the Last Year: Never true    Ran Out of Food in the Last Year: Never true  Transportation Needs: No  Transportation Needs (01/04/2023)   PRAPARE - Administrator, Civil Service (Medical): No    Lack of Transportation (Non-Medical): No  Physical Activity: Not on file  Stress: Not on file  Social Connections: Not on file  Intimate Partner Violence: Not At Risk (01/04/2023)   Humiliation, Afraid, Rape, and Kick questionnaire    Fear of Current or Ex-Partner: No    Emotionally Abused: No    Physically Abused: No    Sexually Abused: No   Patient has no known allergies.    Vitals:   01/04/23 1821 01/04/23 2003  BP: (!) 143/82 135/66  Pulse: 89 64  Resp:  16  Temp:  97.7 F (36.5 C)  SpO2:  96%     General:  NAD Cardiovascular:  RRR Respiratory:  Lungs are clear, no increased work of breathing, no tachypnea Abdomen:  soft, fundus firm Extremities: 1+ edema to the knees bilaterally    A/P   32 y.o. Z6X0960 PPD#6 with a history of chronic hypertension presents with worsening blood pressure, shortness of breath and chest pain  DDx includes postpartum preeclampsia vs fluid overload / worsening chronic hypertension vs PP cardiomyopathy.  I suspect this is mostly likely volume overload, but will admit for ECHO in AM to r/o PP cardiomyopathy  -Admit to antepartum -Received 1 dose of procardia Xl 30mg  daily in MAU.  Patient on this medication following last pregnancy but caused significant headache.  Will switch to norvasc tomorrow for next dose -Lasix x 5 days -ECHO in AM  [facility] GEFFEL [place]

## 2023-01-04 NOTE — MAU Note (Signed)
.  Carmen Wall is a 32 y.o. at Unknown here in MAU reporting: Sent over from the office for further evaluation of chest pain and SOB. She reports the pain she is experiencing in her chest began 1-2 days ago and reports when the pain occurs it is deep within the center of her chest. She reports the SOB occurs with lying flat on her back.   Pain score: 5/10 deep chest  Vitals:   01/04/23 1554  BP: (!) 167/82  Pulse: (!) 58  Resp: 20  Temp: 98.2 F (36.8 C)  SpO2: 97%      Lab orders placed from triage: EKG Routine

## 2023-01-05 ENCOUNTER — Telehealth: Payer: Self-pay | Admitting: Family Medicine

## 2023-01-05 ENCOUNTER — Observation Stay (HOSPITAL_COMMUNITY): Payer: Managed Care, Other (non HMO)

## 2023-01-05 DIAGNOSIS — R079 Chest pain, unspecified: Secondary | ICD-10-CM

## 2023-01-05 DIAGNOSIS — O99893 Other specified diseases and conditions complicating puerperium: Secondary | ICD-10-CM | POA: Diagnosis not present

## 2023-01-05 MED ORDER — FUROSEMIDE 20 MG PO TABS: 20.0000 mg | ORAL_TABLET | Freq: Every day | ORAL | 0 refills | Status: DC

## 2023-01-05 MED ORDER — AMLODIPINE BESYLATE 5 MG PO TABS: 5.0000 mg | ORAL_TABLET | Freq: Every day | ORAL | 1 refills | Status: DC

## 2023-01-05 NOTE — Telephone Encounter (Signed)
Error

## 2023-01-05 NOTE — Progress Notes (Signed)
POSTPARTUM NOTE  S: Morning, patient feels improved.  Baby is formula feeding without issue.  Patient denies engorgement in breast.  Minimal lochia at this time.  Occasional cramping but nothing worrisome.  Voiding and ambulating without issue.  Denies shortness of breath, chest pain, palpitations.  Denies headaches after switching blood pressure medications.  Denies cough and feels that lower extremity swelling has improved since admission.  Denies fevers, chills, nausea, vomiting, visual changes.  Completed echocardiogram this morning.  O: BP 139/68 (BP Location: Left Arm)   Pulse 77   Temp 98.1 F (36.7 C) (Oral)   Resp 18   LMP 04/09/2022   SpO2 99%   Breastfeeding Unknown  General: No acute distress CV: Clear to auscultation bilaterally, no murmurs rubs or gallops. Abdominal: Fundus 3 below umbilicus, firm.  Nontender to palpation, bowel sounds x 4.   MSK: Trace pedal edema bilaterally, negative calf tenderness bilaterally Psych/neuro: Grossly within normal limits  IMAGING:  IMPRESSIONS     1. Left ventricular ejection fraction, by estimation, is 60 to 65%. The  left ventricle has normal function. The left ventricle has no regional  wall motion abnormalities. Left ventricular diastolic parameters were  normal.   2. Right ventricular systolic function is normal. The right ventricular  size is normal. There is normal pulmonary artery systolic pressure.   3. The mitral valve is normal in structure. Trivial mitral valve  regurgitation. No evidence of mitral stenosis.   4. The aortic valve is tricuspid. Aortic valve regurgitation is not  visualized. No aortic stenosis is present.   5. The inferior vena cava is dilated in size with >50% respiratory  variability, suggesting right atrial pressure of 8 mmHg.   Comparison(s): No prior Echocardiogram.   Conclusion(s)/Recommendation(s): Normal biventricular function without  evidence of hemodynamically significant valvular heart  disease.   A/P: 32 year old G3 P3-0-0-3 postpartum day #7 with history of chronic hypertension now well-controlled on oral Norvasc admitted for evaluation of shortness of breath and chest pain postpartum. Benign echocardiogram this morning.  Additionally, blood pressures are well-controlled on oral Norvasc.  Patient is also symptomatically improved with lower extremity edema responding well to ambulation as well as oral Lasix started in house.  Patient already has close follow-up scheduled in clinic for next week.  Plan to discharge patient on remainder of 5-day Lasix course with strict precautions.  All questions answered in full

## 2023-01-05 NOTE — Progress Notes (Signed)
Patient given discharge instructions and denies any questions or concerns. Patient ambulatory off unit at this time. 

## 2023-01-05 NOTE — Progress Notes (Signed)
Echocardiogram 2D Echocardiogram has been performed.  Carmen Wall 01/05/2023, 8:51 AM

## 2023-01-05 NOTE — Discharge Summary (Signed)
Physician Discharge Summary  Patient ID: Carmen Wall MRN: [id number] DOB/AGE: 12-Jun-1990 32 y.o.  Admit date: 01/04/2023 Discharge date: 01/05/2023  Admission Diagnoses:  Discharge Diagnoses:  Principal Problem:   Chest pain, unspecified type   Discharged Condition: good  Hospital Course: Added postpartum day 6 with chest pain, labile hypertension and lower extremity edema concerning for possible postpartum cardiomyopathy versus postpartum preeclampsia.  Echocardiogram obtained hospital day 1 grossly within normal limits.  Blood pressure is controlled on restarting oral Norvasc 5 mg daily.  Swelling responded to daily Lasix administration.  Symptomatically, patient improved with minimal interventions.  Will discharge home with strict precautions and close outpatient follow-up next week.  Patient to complete 5-day course of oral Lasix and continue daily Norvasc intake given chronic hypertension status.  Troponins trended were all was negative during stay.    Discharge Exam: Blood pressure 139/68, pulse 77, temperature 98.1 F (36.7 C), temperature source Oral, resp. rate 18, last menstrual period 04/09/2022, SpO2 99%, unknown if currently breastfeeding. General: No acute distress CV: Clear to auscultation bilaterally, no murmurs rubs or gallops. Abdominal: Fundus 3 below umbilicus, firm.  Nontender to palpation, bowel sounds x 4.   MSK: Trace pedal edema bilaterally, negative calf tenderness bilaterally Psych/neuro: Grossly within normal limits  Disposition: Discharge disposition: 01-Home or Self Care       Discharge Instructions     Activity as tolerated   Complete by: As directed    Call MD for:  difficulty breathing, headache or visual disturbances   Complete by: As directed    Call MD for:  hives   Complete by: As directed    Call MD for:  persistant dizziness or light-headedness   Complete by: As directed    Call MD for:  persistant nausea and vomiting    Complete by: As directed    Call MD for:  redness, tenderness, or signs of infection (pain, swelling, redness, odor or green/yellow discharge around incision site)   Complete by: As directed    Call MD for:  severe uncontrolled pain   Complete by: As directed    Call MD for:  temperature >100.4   Complete by: As directed    Diet - low sodium heart healthy   Complete by: As directed    No wound care   Complete by: As directed    Sexual activity   Complete by: As directed    None until 6wks      Allergies as of 01/05/2023   No Known Allergies      Medication List     TAKE these medications    albuterol 108 (90 Base) MCG/ACT inhaler Commonly known as: VENTOLIN HFA Inhale 1 puff into the lungs every 6 (six) hours as needed for wheezing or shortness of breath.   amLODipine 5 MG tablet Commonly known as: NORVASC Take 1 tablet (5 mg total) by mouth daily. Start taking on: January 06, 2023   cholecalciferol 25 MCG (1000 UNIT) tablet Commonly known as: VITAMIN D3 Take 1,000 Units by mouth daily.   furosemide 20 MG tablet Commonly known as: LASIX Take 1 tablet (20 mg total) by mouth daily for 4 days. Start taking on: January 06, 2023   ibuprofen 600 MG tablet Commonly known as: ADVIL Take 1 tablet (600 mg total) by mouth every 6 (six) hours.   magnesium 30 MG tablet Take 30 mg by mouth 2 (two) times daily.   oxyCODONE 5 MG immediate release tablet Commonly known as: Oxy IR/ROXICODONE  Take 1 tablet (5 mg total) by mouth every 6 (six) hours as needed for severe pain.   PrePLUS 27-1 MG Tabs Take 1 tablet by mouth daily.         Signed: Valerie Roys Kara Melching 01/05/2023, 1:07 PM

## 2023-01-06 ENCOUNTER — Ambulatory Visit: Payer: Managed Care, Other (non HMO)

## 2023-01-10 ENCOUNTER — Telehealth: Payer: Self-pay

## 2023-01-10 NOTE — Transitions of Care (Post Inpatient/ED Visit) (Signed)
   01/10/2023  Name: Carmen Wall MRN: [id number] DOB: [birth year]  Today's TOC FU Call Status: Today's TOC FU Call Status:: Unsuccessful Call (1st Attempt) Unsuccessful Call (1st Attempt) Date: 01/10/23  Attempted to reach the patient regarding the most recent Inpatient/ED visit.  Follow Up Plan: Additional outreach attempts will be made to reach the patient to complete the Transitions of Care (Post Inpatient/ED visit) call.   Signature tb, cma

## 2023-01-11 ENCOUNTER — Encounter: Payer: Self-pay | Admitting: Nurse Practitioner

## 2023-01-11 ENCOUNTER — Ambulatory Visit: Payer: Managed Care, Other (non HMO) | Admitting: Nurse Practitioner

## 2023-01-11 DIAGNOSIS — F32A Depression, unspecified: Secondary | ICD-10-CM | POA: Insufficient documentation

## 2023-01-11 DIAGNOSIS — E88819 Insulin resistance, unspecified: Secondary | ICD-10-CM

## 2023-01-11 DIAGNOSIS — F339 Major depressive disorder, recurrent, unspecified: Secondary | ICD-10-CM

## 2023-01-11 DIAGNOSIS — I10 Essential (primary) hypertension: Secondary | ICD-10-CM | POA: Diagnosis not present

## 2023-01-11 MED ORDER — WEGOVY 0.25 MG/0.5ML ~~LOC~~ SOAJ
0.2500 mg | SUBCUTANEOUS | 0 refills | Status: DC
Start: 2023-01-11 — End: 2023-02-09
  Filled 2023-01-16: qty 2, 28d supply, fill #0

## 2023-01-11 MED ORDER — AMLODIPINE BESYLATE 5 MG PO TABS
5.0000 mg | ORAL_TABLET | Freq: Every day | ORAL | 1 refills | Status: DC
Start: 2023-01-11 — End: 2023-04-11

## 2023-01-11 NOTE — Assessment & Plan Note (Signed)
Elevated BP after childbirth 2weeks ago, led to ED visit. Amlodipine 5mg  was resumed. BP at goal today BP Readings from Last 3 Encounters:  01/11/23 120/80  01/05/23 139/68  12/31/22 127/76    Maintain amlodipine dose F/uo in 26month

## 2023-01-11 NOTE — Assessment & Plan Note (Addendum)
Not breastfeeding Not sexually active, plans to have IUD inserted by GYN, has appointment today. Not sexually active since childbirth 2weeks ago Limited exercise due to recent childbirth-2weeks ago Diet: struggles with portion control. Lost 26lbs since childbirth 2weeks ago. We discussed possible side effects of wegovy and contraindications. Wt Readings from Last 3 Encounters:  01/11/23 (!) 315 lb 6.4 oz (143.1 kg)  12/28/22 (!) 341 lb 3.2 oz (154.8 kg)  11/17/22 (!) 328 lb (148.8 kg)    Reviewed lab results completed by ED 1week ago. Normal CMP Start wegovy 0.25mg  weekly Advised to maintain heart healthy diet and start low impact exercise if authorized by GYN F/up in 13month

## 2023-01-11 NOTE — Assessment & Plan Note (Signed)
Hx of postpartum depression. Reports resolved depressive symptoms but lingering anxiety; worse with recent childbirth (worry about SIDs, sleep disruption, fear something bad will happen. States anxiety is less severe compared to after the birth of her second child. Denies any social isolation. Denies any hallucination or SI/HI. No previous IVC. Emotional support: husband and parents. She declined referral to psychology and need for medication at this time

## 2023-01-11 NOTE — Patient Instructions (Signed)
Calorie Counting for Weight Loss Calories are units of energy. Your body needs a certain number of calories from food to keep going throughout the day. When you eat or drink more calories than your body needs, your body stores the extra calories mostly as fat. When you eat or drink fewer calories than your body needs, your body burns fat to get the energy it needs. Calorie counting means keeping track of how many calories you eat and drink each day. Calorie counting can be helpful if you need to lose weight. If you eat fewer calories than your body needs, you should lose weight. Ask your health care provider what a healthy weight is for you. For calorie counting to work, you will need to eat the right number of calories each day to lose a healthy amount of weight per week. A dietitian can help you figure out how many calories you need in a day and will suggest ways to reach your calorie goal. A healthy amount of weight to lose each week is usually 1-2 lb (0.5-0.9 kg). This usually means that your daily calorie intake should be reduced by 500-750 calories. Eating 1,200-1,500 calories a day can help most women lose weight. Eating 1,500-1,800 calories a day can help most men lose weight. What do I need to know about calorie counting? Work with your health care provider or dietitian to determine how many calories you should get each day. To meet your daily calorie goal, you will need to: Find out how many calories are in each food that you would like to eat. Try to do this before you eat. Decide how much of the food you plan to eat. Keep a food log. Do this by writing down what you ate and how many calories it had. To successfully lose weight, it is important to balance calorie counting with a healthy lifestyle that includes regular activity. Where do I find calorie information?  The number of calories in a food can be found on a Nutrition Facts label. If a food does not have a Nutrition Facts label, try  to look up the calories online or ask your dietitian for help. Remember that calories are listed per serving. If you choose to have more than one serving of a food, you will have to multiply the calories per serving by the number of servings you plan to eat. For example, the label on a package of bread might say that a serving size is 1 slice and that there are 90 calories in a serving. If you eat 1 slice, you will have eaten 90 calories. If you eat 2 slices, you will have eaten 180 calories. How do I keep a food log? After each time that you eat, record the following in your food log as soon as possible: What you ate. Be sure to include toppings, sauces, and other extras on the food. How much you ate. This can be measured in cups, ounces, or number of items. How many calories were in each food and drink. The total number of calories in the food you ate. Keep your food log near you, such as in a pocket-sized notebook or on an app or website on your mobile phone. Some programs will calculate calories for you and show you how many calories you have left to meet your daily goal. What are some portion-control tips? Know how many calories are in a serving. This will help you know how many servings you can have of a certain   food. Use a measuring cup to measure serving sizes. You could also try weighing out portions on a kitchen scale. With time, you will be able to estimate serving sizes for some foods. Take time to put servings of different foods on your favorite plates or in your favorite bowls and cups so you know what a serving looks like. Try not to eat straight from a food's packaging, such as from a bag or box. Eating straight from the package makes it hard to see how much you are eating and can lead to overeating. Put the amount you would like to eat in a cup or on a plate to make sure you are eating the right portion. Use smaller plates, glasses, and bowls for smaller portions and to prevent  overeating. Try not to multitask. For example, avoid watching TV or using your computer while eating. If it is time to eat, sit down at a table and enjoy your food. This will help you recognize when you are full. It will also help you be more mindful of what and how much you are eating. What are tips for following this plan? Reading food labels Check the calorie count compared with the serving size. The serving size may be smaller than what you are used to eating. Check the source of the calories. Try to choose foods that are high in protein, fiber, and vitamins, and low in saturated fat, trans fat, and sodium. Shopping Read nutrition labels while you shop. This will help you make healthy decisions about which foods to buy. Pay attention to nutrition labels for low-fat or fat-free foods. These foods sometimes have the same number of calories or more calories than the full-fat versions. They also often have added sugar, starch, or salt to make up for flavor that was removed with the fat. Make a grocery list of lower-calorie foods and stick to it. Cooking Try to cook your favorite foods in a healthier way. For example, try baking instead of frying. Use low-fat dairy products. Meal planning Use more fruits and vegetables. One-half of your plate should be fruits and vegetables. Include lean proteins, such as chicken, turkey, and fish. Lifestyle Each week, aim to do one of the following: 150 minutes of moderate exercise, such as walking. 75 minutes of vigorous exercise, such as running. General information Know how many calories are in the foods you eat most often. This will help you calculate calorie counts faster. Find a way of tracking calories that works for you. Get creative. Try different apps or programs if writing down calories does not work for you. What foods should I eat?  Eat nutritious foods. It is better to have a nutritious, high-calorie food, such as an avocado, than a food with  few nutrients, such as a bag of potato chips. Use your calories on foods and drinks that will fill you up and will not leave you hungry soon after eating. Examples of foods that fill you up are nuts and nut butters, vegetables, lean proteins, and high-fiber foods such as whole grains. High-fiber foods are foods with more than 5 g of fiber per serving. Pay attention to calories in drinks. Low-calorie drinks include water and unsweetened drinks. The items listed above may not be a complete list of foods and beverages you can eat. Contact a dietitian for more information. What foods should I limit? Limit foods or drinks that are not good sources of vitamins, minerals, or protein or that are high in unhealthy fats. These   include: Candy. Other sweets. Sodas, specialty coffee drinks, alcohol, and juice. The items listed above may not be a complete list of foods and beverages you should avoid. Contact a dietitian for more information. How do I count calories when eating out? Pay attention to portions. Often, portions are much larger when eating out. Try these tips to keep portions smaller: Consider sharing a meal instead of getting your own. If you get your own meal, eat only half of it. Before you start eating, ask for a container and put half of your meal into it. When available, consider ordering smaller portions from the menu instead of full portions. Pay attention to your food and drink choices. Knowing the way food is cooked and what is included with the meal can help you eat fewer calories. If calories are listed on the menu, choose the lower-calorie options. Choose dishes that include vegetables, fruits, whole grains, low-fat dairy products, and lean proteins. Choose items that are boiled, broiled, grilled, or steamed. Avoid items that are buttered, battered, fried, or served with cream sauce. Items labeled as crispy are usually fried, unless stated otherwise. Choose water, low-fat milk,  unsweetened iced tea, or other drinks without added sugar. If you want an alcoholic beverage, choose a lower-calorie option, such as a glass of wine or light beer. Ask for dressings, sauces, and syrups on the side. These are usually high in calories, so you should limit the amount you eat. If you want a salad, choose a garden salad and ask for grilled meats. Avoid extra toppings such as bacon, cheese, or fried items. Ask for the dressing on the side, or ask for olive oil and vinegar or lemon to use as dressing. Estimate how many servings of a food you are given. Knowing serving sizes will help you be aware of how much food you are eating at restaurants. Where to find more information Centers for Disease Control and Prevention: www.cdc.gov [place] Department of Agriculture: myplate.gov Summary Calorie counting means keeping track of how many calories you eat and drink each day. If you eat fewer calories than your body needs, you should lose weight. A healthy amount of weight to lose per week is usually 1-2 lb (0.5-0.9 kg). This usually means reducing your daily calorie intake by 500-750 calories. The number of calories in a food can be found on a Nutrition Facts label. If a food does not have a Nutrition Facts label, try to look up the calories online or ask your dietitian for help. Use smaller plates, glasses, and bowls for smaller portions and to prevent overeating. Use your calories on foods and drinks that will fill you up and not leave you hungry shortly after a meal. This information is not intended to replace advice given to you by your health care provider. Make sure you discuss any questions you have with your health care provider. Document Revised: 07/04/2019 Document Reviewed: 07/04/2019 [employer] Patient Education  2023 [employer]  

## 2023-01-11 NOTE — Progress Notes (Signed)
Established Patient Visit  Patient: Carmen Wall   DOB: [birth year]   32 y.o. Female  MRN: [id number] Visit Date: 01/11/2023  Subjective:    Chief Complaint  Patient presents with   Weight Check    Try Weight loss medication- Wegovy    HPI Depression, recurrent (HCC) Hx of postpartum depression. Reports resolved depressive symptoms but lingering anxiety; worse with recent childbirth (worry about SIDs, sleep disruption, fear something bad will happen. States anxiety is less severe compared to after the birth of her second child. Denies any social isolation. Denies any hallucination or SI/HI. No previous IVC. Emotional support: husband and parents. She declined referral to psychology and need for medication at this time   Primary hypertension Elevated BP after childbirth 2weeks ago, led to ED visit. Amlodipine 5mg  was resumed. BP at goal today BP Readings from Last 3 Encounters:  01/11/23 120/80  01/05/23 139/68  12/31/22 127/76    Maintain amlodipine dose F/uo in 35month  Morbid obesity (HCC) Not breastfeeding Not sexually active, plans to have IUD inserted by GYN, has appointment today. Not sexually active since childbirth 2weeks ago Limited exercise due to recent childbirth-2weeks ago Diet: struggles with portion control. Lost 26lbs since childbirth 2weeks ago. We discussed possible side effects of wegovy and contraindications. Wt Readings from Last 3 Encounters:  01/11/23 (!) 315 lb 6.4 oz (143.1 kg)  12/28/22 (!) 341 lb 3.2 oz (154.8 kg)  11/17/22 (!) 328 lb (148.8 kg)    Reviewed lab results completed by ED 1week ago. Normal CMP Start wegovy 0.25mg  weekly Advised to maintain heart healthy diet and start low impact exercise if authorized by GYN F/up in 35month   Reviewed medical, surgical, and social history today  Medications: Outpatient Medications Prior to Visit  Medication Sig   albuterol (VENTOLIN HFA) 108 (90 Base) MCG/ACT  inhaler Inhale 1 puff into the lungs every 6 (six) hours as needed for wheezing or shortness of breath.   cholecalciferol (VITAMIN D3) 25 MCG (1000 UNIT) tablet Take 1,000 Units by mouth daily.   ibuprofen (ADVIL) 600 MG tablet Take 1 tablet (600 mg total) by mouth every 6 (six) hours.   magnesium 30 MG tablet Take 30 mg by mouth 2 (two) times daily.   Prenatal Vit-Fe Fumarate-FA (PREPLUS) 27-1 MG TABS Take 1 tablet by mouth daily.   [DISCONTINUED] amLODipine (NORVASC) 5 MG tablet Take 1 tablet (5 mg total) by mouth daily.   [DISCONTINUED] furosemide (LASIX) 20 MG tablet Take 1 tablet (20 mg total) by mouth daily for 4 days.   [DISCONTINUED] oxyCODONE (OXY IR/ROXICODONE) 5 MG immediate release tablet Take 1 tablet (5 mg total) by mouth every 6 (six) hours as needed for severe pain. (Patient not taking: Reported on 01/11/2023)   [DISCONTINUED] triamcinolone cream (KENALOG) 0.1 % cream    No facility-administered medications prior to visit.   Reviewed past medical and social history.   ROS per HPI above  Last CBC Lab Results  Component Value Date   WBC 5.6 01/04/2023   HGB 11.6 (L) 01/04/2023   HCT 34.9 (L) 01/04/2023   MCV 92.8 01/04/2023   MCH 30.9 01/04/2023   RDW 13.8 01/04/2023   PLT 180 01/04/2023   Last metabolic panel Lab Results  Component Value Date   GLUCOSE 94 01/04/2023   NA 140 01/04/2023   K 4.6 01/04/2023   CL 106 01/04/2023   CO2 27 01/04/2023  BUN 19 01/04/2023   CREATININE 0.81 01/04/2023   GFRNONAA >60 01/04/2023   CALCIUM 8.7 (L) 01/04/2023   PROT 5.2 (L) 01/04/2023   ALBUMIN 2.4 (L) 01/04/2023   LABGLOB 2.6 09/27/2022   AGRATIO 1.4 09/27/2022   BILITOT 0.4 01/04/2023   ALKPHOS 93 01/04/2023   AST 25 01/04/2023   ALT 34 01/04/2023   ANIONGAP 7 01/04/2023   Last lipids Lab Results  Component Value Date   CHOL 218 (H) 04/22/2020   HDL 47 04/22/2020   LDLCALC 148 (H) 04/22/2020   TRIG 127 04/22/2020   CHOLHDL 4 08/24/2018   Last hemoglobin  A1c Lab Results  Component Value Date   HGBA1C 5.1 07/04/2022   Last thyroid functions Lab Results  Component Value Date   TSH 1.21 11/09/2021   T3TOTAL 125 07/03/2019   Last vitamin D Lab Results  Component Value Date   VD25OH 25.33 (L) 11/09/2021      Objective:  BP 120/80 (BP Location: Left Arm, Patient Position: Sitting, Cuff Size: Large)   Pulse 86   Temp 98.6 F (37 C) (Temporal)   Resp 16   Ht 5\' 7"  (1.702 m)   Wt (!) 315 lb 6.4 oz (143.1 kg)   LMP 04/09/2022   SpO2 98%   Breastfeeding No   BMI 49.40 kg/m      Physical Exam Cardiovascular:     Rate and Rhythm: Normal rate and regular rhythm.     Pulses: Normal pulses.     Heart sounds: Normal heart sounds.  Pulmonary:     Effort: Pulmonary effort is normal.     Breath sounds: Normal breath sounds.  Neurological:     Mental Status: She is alert and oriented to person, place, and time.     No results found for any visits on 01/11/23.    Assessment & Plan:    Problem List Items Addressed This Visit       Cardiovascular and Mediastinum   Primary hypertension    Elevated BP after childbirth 2weeks ago, led to ED visit. Amlodipine 5mg  was resumed. BP at goal today BP Readings from Last 3 Encounters:  01/11/23 120/80  01/05/23 139/68  12/31/22 127/76    Maintain amlodipine dose F/uo in 62month      Relevant Medications   Semaglutide-Weight Management (WEGOVY) 0.25 MG/0.5ML SOAJ   amLODipine (NORVASC) 5 MG tablet     Endocrine   Insulin resistance   Relevant Medications   Semaglutide-Weight Management (WEGOVY) 0.25 MG/0.5ML SOAJ     Other   Depression, recurrent (HCC)    Hx of postpartum depression. Reports resolved depressive symptoms but lingering anxiety; worse with recent childbirth (worry about SIDs, sleep disruption, fear something bad will happen. States anxiety is less severe compared to after the birth of her second child. Denies any social isolation. Denies any hallucination or  SI/HI. No previous IVC. Emotional support: husband and parents. She declined referral to psychology and need for medication at this time       Morbid obesity (HCC) - Primary    Not breastfeeding Not sexually active, plans to have IUD inserted by GYN, has appointment today. Not sexually active since childbirth 2weeks ago Limited exercise due to recent childbirth-2weeks ago Diet: struggles with portion control. Lost 26lbs since childbirth 2weeks ago. We discussed possible side effects of wegovy and contraindications. Wt Readings from Last 3 Encounters:  01/11/23 (!) 315 lb 6.4 oz (143.1 kg)  12/28/22 (!) 341 lb 3.2 oz (154.8 kg)  11/17/22 Marland Kitchen)  328 lb (148.8 kg)    Reviewed lab results completed by ED 1week ago. Normal CMP Start wegovy 0.25mg  weekly Advised to maintain heart healthy diet and start low impact exercise if authorized by GYN F/up in 49month       Relevant Medications   Semaglutide-Weight Management (WEGOVY) 0.25 MG/0.5ML SOAJ   Return in about 4 weeks (around 02/08/2023) for Weight management (fasting).     Alysia Penna, NP

## 2023-01-14 ENCOUNTER — Inpatient Hospital Stay (HOSPITAL_COMMUNITY): Admission: AD | Admit: 2023-01-14 | Payer: Managed Care, Other (non HMO) | Source: Home / Self Care

## 2023-01-16 ENCOUNTER — Other Ambulatory Visit (HOSPITAL_COMMUNITY): Payer: Self-pay

## 2023-01-17 ENCOUNTER — Telehealth: Payer: Self-pay | Admitting: Nurse Practitioner

## 2023-01-17 ENCOUNTER — Other Ambulatory Visit (HOSPITAL_COMMUNITY): Payer: Self-pay

## 2023-01-17 NOTE — Telephone Encounter (Signed)
Pt was told she will need a PA for her Rx #: [id number]  Semaglutide-Weight Management (WEGOVY) 0.25 MG/0.5ML Ivory Broad [[id number]] script.   Pharmacy  [facility] [address], [place] [address] Phone: [phone]  Fax: [phone] DEA #: [id number]   Pt at [phone]

## 2023-01-18 ENCOUNTER — Other Ambulatory Visit (HOSPITAL_COMMUNITY): Payer: Self-pay

## 2023-01-18 ENCOUNTER — Telehealth: Payer: Self-pay

## 2023-01-18 NOTE — Telephone Encounter (Signed)
Pharmacy Patient Advocate Encounter   Received notification from Pt Calls Messages that prior authorization for Wegovy 0.25mg /0.60ml is required/requested.   Insurance verification completed.   The patient is insured through [employer] .   Per test claim: PA required; PA submitted to [employer] via CoverMyMeds Key/confirmation #/EOC [id number] Status is pending

## 2023-01-18 NOTE — Telephone Encounter (Signed)
Pt.notified

## 2023-01-18 NOTE — Telephone Encounter (Signed)
Pharmacy Patient Advocate Encounter  Received notification from [employer] that Prior Authorization for Wegovy 0.25mg /0.23ml has been APPROVED from 12/19/22 to 08/16/23   PA #/Case ID/Reference #: [id number]

## 2023-01-19 ENCOUNTER — Encounter (INDEPENDENT_AMBULATORY_CARE_PROVIDER_SITE_OTHER): Payer: Self-pay

## 2023-01-24 ENCOUNTER — Other Ambulatory Visit (HOSPITAL_COMMUNITY): Payer: Self-pay

## 2023-01-24 ENCOUNTER — Telehealth (HOSPITAL_COMMUNITY): Payer: Self-pay | Admitting: *Deleted

## 2023-01-24 NOTE — Telephone Encounter (Signed)
01/24/2023  Name: Carmen Wall MRN: [id number] DOB: 01/19/1991  Reason for Call:  Transition of Care Hospital Discharge Call  Contact Status: Patient Contact Status: Complete  Language assistant needed: Interpreter Mode: Interpreter Not Needed        Follow-Up Questions: Do You Have Any Concerns About Your Health As You Heal From Delivery?: No Do You Have Any Concerns About Your Infants Health?: No  Edinburgh Postnatal Depression Scale:  In the Past 7 Days:  Deferred, see below  PHQ2-9 Depression Scale:     Discharge Follow-up: Edinburgh score requires follow up?:  (Patient declined screening at this time.  Had a score of 2 in the last week at her MD office and feels that is still accurate.)  Post-discharge interventions: Reviewed Newborn Safe Sleep Practices  Salena Saner, RN 01/24/2023 14:30

## 2023-02-08 ENCOUNTER — Ambulatory Visit: Payer: Managed Care, Other (non HMO) | Admitting: Nurse Practitioner

## 2023-02-09 ENCOUNTER — Ambulatory Visit: Payer: Managed Care, Other (non HMO) | Admitting: Nurse Practitioner

## 2023-02-09 ENCOUNTER — Encounter: Payer: Self-pay | Admitting: Nurse Practitioner

## 2023-02-09 DIAGNOSIS — E559 Vitamin D deficiency, unspecified: Secondary | ICD-10-CM | POA: Diagnosis not present

## 2023-02-09 DIAGNOSIS — Z6841 Body Mass Index (BMI) 40.0 and over, adult: Secondary | ICD-10-CM

## 2023-02-09 DIAGNOSIS — D500 Iron deficiency anemia secondary to blood loss (chronic): Secondary | ICD-10-CM

## 2023-02-09 DIAGNOSIS — Z23 Encounter for immunization: Secondary | ICD-10-CM

## 2023-02-09 LAB — CBC WITH DIFFERENTIAL/PLATELET
Basophils Absolute: 0 10*3/uL (ref 0.0–0.1)
Basophils Relative: 0.6 % (ref 0.0–3.0)
Eosinophils Absolute: 0.1 10*3/uL (ref 0.0–0.7)
Eosinophils Relative: 1.4 % (ref 0.0–5.0)
HCT: 40.5 % (ref 36.0–46.0)
Hemoglobin: 13.4 g/dL (ref 12.0–15.0)
Lymphocytes Relative: 25.3 % (ref 12.0–46.0)
Lymphs Abs: 1.6 10*3/uL (ref 0.7–4.0)
MCHC: 33.2 g/dL (ref 30.0–36.0)
MCV: 88.3 fl (ref 78.0–100.0)
Monocytes Absolute: 0.4 10*3/uL (ref 0.1–1.0)
Monocytes Relative: 6.5 % (ref 3.0–12.0)
Neutro Abs: 4.1 10*3/uL (ref 1.4–7.7)
Neutrophils Relative %: 66.2 % (ref 43.0–77.0)
Platelets: 200 10*3/uL (ref 150.0–400.0)
RBC: 4.58 Mil/uL (ref 3.87–5.11)
RDW: 14.1 % (ref 11.5–15.5)
WBC: 6.1 10*3/uL (ref 4.0–10.5)

## 2023-02-09 LAB — LDL CHOLESTEROL, DIRECT: Direct LDL: 197 mg/dL

## 2023-02-09 LAB — COMPREHENSIVE METABOLIC PANEL
ALT: 25 U/L (ref 0–35)
AST: 19 U/L (ref 0–37)
Albumin: 4 g/dL (ref 3.5–5.2)
Alkaline Phosphatase: 79 U/L (ref 39–117)
BUN: 11 mg/dL (ref 6–23)
CO2: 29 meq/L (ref 19–32)
Calcium: 9.2 mg/dL (ref 8.4–10.5)
Chloride: 105 meq/L (ref 96–112)
Creatinine, Ser: 0.79 mg/dL (ref 0.40–1.20)
GFR: 97.85 mL/min (ref >60.00)
Glucose, Bld: 74 mg/dL (ref 70–99)
Potassium: 4.3 meq/L (ref 3.5–5.1)
Sodium: 142 meq/L (ref 135–145)
Total Bilirubin: 0.4 mg/dL (ref 0.2–1.2)
Total Protein: 6.8 g/dL (ref 6.0–8.3)

## 2023-02-09 LAB — POCT URINE PREGNANCY: Preg Test, Ur: NEGATIVE

## 2023-02-09 LAB — VITAMIN D 25 HYDROXY (VIT D DEFICIENCY, FRACTURES): VITD: 21.49 ng/mL — ABNORMAL LOW (ref 30.00–100.00)

## 2023-02-09 MED ORDER — WEGOVY 0.5 MG/0.5ML ~~LOC~~ SOAJ
0.5000 mg | SUBCUTANEOUS | 0 refills | Status: DC
  Filled 2023-02-10: qty 2, 28d supply, fill #0

## 2023-02-09 NOTE — Progress Notes (Signed)
Established Patient Visit  Patient: Carmen Wall   DOB: 1990-08-06   32 y.o. Female  MRN: [id number] Visit Date: 02/09/2023  Subjective:    Chief Complaint  Patient presents with   Weight Check     flu    HPI  Morbid obesity (HCC) No weight loss noted with wegovy 0.25mg  x 4weeks. No adverse effects with wegovy. Reports decreased in meal portions, but persistent sugar cravings. No exercise regimen Waiting for insurance to approve IUD insertion, LMP 02/01/2023 Wt Readings from Last 3 Encounters:  02/09/23 (!) 317 lb 9.6 oz (144.1 kg)  01/11/23 (!) 315 lb 6.4 oz (143.1 kg)  12/28/22 (!) 341 lb 3.2 oz (154.8 kg)    Urine pregnancy: negative today Check CMP and LDL Increase wegovy dose to 0.5mg  weekly F/up in 4weeks  Iron deficiency anemia due to chronic blood loss Repeat CB and iron panel   Reviewed medical, surgical, and social history today  Medications: Outpatient Medications Prior to Visit  Medication Sig   albuterol (VENTOLIN HFA) 108 (90 Base) MCG/ACT inhaler Inhale 1 puff into the lungs every 6 (six) hours as needed for wheezing or shortness of breath.   amLODipine (NORVASC) 5 MG tablet Take 1 tablet (5 mg total) by mouth daily.   cholecalciferol (VITAMIN D3) 25 MCG (1000 UNIT) tablet Take 1,000 Units by mouth daily.   ibuprofen (ADVIL) 600 MG tablet Take 1 tablet (600 mg total) by mouth every 6 (six) hours.   magnesium 30 MG tablet Take 30 mg by mouth 2 (two) times daily.   Prenatal Vit-Fe Fumarate-FA (PREPLUS) 27-1 MG TABS Take 1 tablet by mouth daily.   [DISCONTINUED] Semaglutide-Weight Management (WEGOVY) 0.25 MG/0.5ML SOAJ Inject 0.25 mg into the skin once a week.   No facility-administered medications prior to visit.   Reviewed past medical and social history.   ROS per HPI above      Objective:  BP 118/82 (BP Location: Left Arm, Patient Position: Sitting, Cuff Size: Large)   Pulse 82   Temp 98.2 F (36.8 C) (Temporal)    Ht 5\' 7"  (1.702 m)   Wt (!) 317 lb 9.6 oz (144.1 kg)   LMP 02/01/2023 (Exact Date)   SpO2 99%   Breastfeeding No   BMI 49.74 kg/m      Physical Exam Neck:     Thyroid: No thyroid mass, thyromegaly or thyroid tenderness.  Cardiovascular:     Rate and Rhythm: Normal rate.     Pulses: Normal pulses.  Pulmonary:     Effort: Pulmonary effort is normal.  Musculoskeletal:     Cervical back: Normal range of motion and neck supple.     Right lower leg: No edema.     Left lower leg: No edema.  Lymphadenopathy:     Cervical: No cervical adenopathy.  Neurological:     Mental Status: She is alert and oriented to person, place, and time.     Results for orders placed or performed in visit on 02/09/23  POCT urine pregnancy  Result Value Ref Range   Preg Test, Ur Negative Negative      Assessment & Plan:    Problem List Items Addressed This Visit     Iron deficiency anemia due to chronic blood loss    Repeat CB and iron panel      Relevant Orders   CBC with Differential/Platelet   Iron, TIBC and Ferritin Panel  Morbid obesity (HCC) - Primary    No weight loss noted with wegovy 0.25mg  x 4weeks. No adverse effects with wegovy. Reports decreased in meal portions, but persistent sugar cravings. No exercise regimen Waiting for insurance to approve IUD insertion, LMP 02/01/2023 Wt Readings from Last 3 Encounters:  02/09/23 (!) 317 lb 9.6 oz (144.1 kg)  01/11/23 (!) 315 lb 6.4 oz (143.1 kg)  12/28/22 (!) 341 lb 3.2 oz (154.8 kg)    Urine pregnancy: negative today Check CMP and LDL Increase wegovy dose to 0.5mg  weekly F/up in 4weeks      Relevant Medications   Semaglutide-Weight Management (WEGOVY) 0.5 MG/0.5ML SOAJ   Other Relevant Orders   POCT urine pregnancy (Completed)   Comprehensive metabolic panel   Direct LDL   Other Visit Diagnoses     Vitamin D deficiency       Relevant Orders   VITAMIN D 25 Hydroxy (Vit-D Deficiency, Fractures)   Encounter for immunization        Relevant Orders   Flu vaccine trivalent PF, 6mos and older(Flulaval,Afluria,Fluarix,Fluzone) (Completed)      Return in about 4 weeks (around 03/09/2023) for Weight management.     Alysia Penna, NP

## 2023-02-09 NOTE — Assessment & Plan Note (Signed)
No weight loss noted with wegovy 0.25mg  x 4weeks. No adverse effects with wegovy. Reports decreased in meal portions, but persistent sugar cravings. No exercise regimen Waiting for insurance to approve IUD insertion, LMP 02/01/2023 Wt Readings from Last 3 Encounters:  02/09/23 (!) 317 lb 9.6 oz (144.1 kg)  01/11/23 (!) 315 lb 6.4 oz (143.1 kg)  12/28/22 (!) 341 lb 3.2 oz (154.8 kg)    Urine pregnancy: negative today Check CMP and LDL Increase wegovy dose to 0.5mg  weekly F/up in 4weeks

## 2023-02-09 NOTE — Patient Instructions (Addendum)
Increase wegovy to 0.5mg  weekly Go to lab for urine and blood collection. Maintain healthy diet and daily exercise  How to Increase Your Level of Physical Activity Getting regular physical activity is important for your overall health and well-being. Most people do not get enough exercise. There are easy ways to increase your level of physical activity, even if you have not been very active in the past or if you are just starting out. What are the benefits of physical activity? Physical activity has many short-term and long-term benefits. Being active on a regular basis can improve your physical and mental health as well as provide other benefits. Physical health benefits Helping you lose weight or maintain a healthy weight. Strengthening your muscles and bones. Reducing your risk of certain long-term (chronic) diseases, including heart disease, cancer, and diabetes. Being able to move around more easily and for longer periods of time without getting tired (increased endurance or stamina). Improving your ability to fight off illness (enhanced immunity). Being able to sleep better. Helping you stay healthy as you get older, including: Helping you stay mobile, or capable of walking and moving around. Preventing accidents, such as falls. Increasing life expectancy. Mental health benefits Boosting your mood and improving your self-esteem. Lowering your chance of having mental health problems, such as depression or anxiety. Helping you feel good about your body. Other benefits Finding new sources of fun and enjoyment. Meeting new people who share a common interest. Before you begin If you have a chronic illness or have not been active for a while, check with your health care provider about how to get started. Ask your health care provider what activities are safe for you. Start out slowly. Walking or doing some simple chair exercises is a good place to start, especially if you have not been  active before or for a long time. Set goals that you can work toward. Ask your health care provider how much exercise is best for you. In general, most adults should: Do moderate-intensity exercise for at least 150 minutes each week (30 minutes on most days of the week) or vigorous exercise for at least 75 minutes each week, or a combination of these. Moderate-intensity exercise can include walking at a quick pace, biking, yoga, water aerobics, or gardening. Vigorous exercise involves activities that take more effort, such as jogging or running, playing sports, swimming laps, or jumping rope. Do strength exercises on at least 2 days each week. This can include weight lifting, body weight exercises, and resistance-band exercises. How to be more physically active Make a plan  Try to find activities that you enjoy. You are more likely to commit to an exercise routine if it does not feel like a chore. If you have bone or joint problems, choose low-impact exercises, like walking or swimming. Use these tips for being successful with an exercise plan: Find a workout partner for accountability. Join a group or class, such as an aerobics class, cycling class, or sports team. Make family time active. Go for a walk, bike, or swim. Include a variety of exercises each week. Consider using a fitness tracker, such as a mobile phone app or a device worn like a watch, that will count the number of steps you take each day. Many people strive to reach 10,000 steps a day. Find ways to be active in your daily routines Besides your formal exercise plans, you can find ways to do physical activity during your daily routines, such as: Walking or biking to  work or to [employer]. Taking the stairs instead of the elevator. Parking farther away from the door at work or at the store. Planning walking meetings. Walking around while you are on the phone. Where to find more information Centers for Disease Control and  Prevention: [employer].pt President's Council on Fitness, Sports & Nutrition: www.fitness.gov ChooseMyPlate: http://www.harvey.com/ Contact a health care provider if: You have headaches, muscle aches, or joint pain that is concerning. You feel dizzy or light-headed while exercising. You faint. You feel your heart skipping, racing, or fluttering. You have chest pain while exercising. Summary Exercise benefits your mind and body at any age, even if you are just starting out. If you have a chronic illness or have not been active for a while, check with your health care provider before increasing your physical activity. Choose activities that are safe and enjoyable for you. Ask your health care provider what activities are safe for you. Start slowly. Tell your health care provider if you have problems as you start to increase your activity level. This information is not intended to replace advice given to you by your health care provider. Make sure you discuss any questions you have with your health care provider. Document Revised: 09/18/2020 Document Reviewed: 09/18/2020 Elsevier Patient Education  2024 [employer].

## 2023-02-09 NOTE — Assessment & Plan Note (Signed)
Repeat CB and iron panel

## 2023-02-10 ENCOUNTER — Other Ambulatory Visit (HOSPITAL_COMMUNITY): Payer: Self-pay

## 2023-02-10 LAB — IRON,TIBC AND FERRITIN PANEL
%SAT: 22 % (ref 16–45)
Ferritin: 50 ng/mL (ref 16–154)
Iron: 71 ug/dL (ref 40–190)
TIBC: 319 ug/dL (ref 250–450)

## 2023-02-15 ENCOUNTER — Other Ambulatory Visit (HOSPITAL_COMMUNITY): Payer: Self-pay

## 2023-02-28 ENCOUNTER — Encounter: Payer: Self-pay | Admitting: Nurse Practitioner

## 2023-03-09 ENCOUNTER — Encounter: Payer: Self-pay | Admitting: Nurse Practitioner

## 2023-03-09 ENCOUNTER — Ambulatory Visit: Payer: Managed Care, Other (non HMO) | Admitting: Nurse Practitioner

## 2023-03-09 ENCOUNTER — Other Ambulatory Visit (HOSPITAL_COMMUNITY): Payer: Self-pay

## 2023-03-09 DIAGNOSIS — Z6841 Body Mass Index (BMI) 40.0 and over, adult: Secondary | ICD-10-CM | POA: Diagnosis not present

## 2023-03-09 MED ORDER — WEGOVY 1 MG/0.5ML ~~LOC~~ SOAJ
1.0000 mg | SUBCUTANEOUS | 0 refills | Status: DC
  Filled 2023-03-09: qty 2, 28d supply, fill #0

## 2023-03-09 NOTE — Patient Instructions (Signed)
Avoid skipping meals Incorporate high protein/high fiber diet Maintain 64oz of water daily.  Mediterranean Diet A Mediterranean diet is based on the traditions of countries on the [employer]. It focuses on eating more: Fruits and vegetables. Whole grains, beans, nuts, and seeds. Heart-healthy fats. These are fats that are good for your heart. It involves eating less: Dairy. Meat and eggs. Processed foods with added sugar, salt, and fat. This type of diet can help prevent certain conditions. It can also improve outcomes if you have a long-term (chronic) disease, such as kidney or heart disease. What are tips for following this plan? Reading food labels Check packaged foods for: The serving size. For foods such as rice and pasta, the serving size is the amount of cooked product, not dry. The total fat. Avoid foods with saturated fat or trans fat. Added sugars, such as corn syrup. Shopping  Try to have a balanced diet. Buy a variety of foods, such as: Fresh fruits and vegetables. You may be able to get these from local farmers markets. You can also buy them frozen. Grains, beans, nuts, and seeds. Some of these can be bought in bulk. Fresh seafood. Poultry and eggs. Low-fat dairy products. Buy whole ingredients instead of foods that have already been packaged. If you can't get fresh seafood, buy precooked frozen shrimp or canned fish, such as tuna, salmon, or sardines. Stock your pantry so you always have certain foods on hand, such as olive oil, canned tuna, canned tomatoes, rice, pasta, and beans. Cooking Cook foods with extra-virgin olive oil instead of using butter or other vegetable oils. Have meat as a side dish. Have vegetables or grains as your main dish. This means having meat in small portions or adding small amounts of meat to foods like pasta or stew. Use beans or vegetables instead of meat in common dishes like chili or lasagna. Try out different cooking methods.  Try roasting, broiling, steaming, and sauting vegetables. Add frozen vegetables to soups, stews, pasta, or rice. Add nuts or seeds for added healthy fats and plant protein at each meal. You can add these to yogurt, salads, or vegetable dishes. Marinate fish or vegetables using olive oil, lemon juice, garlic, and fresh herbs. Meal planning Plan to eat a vegetarian meal one day each week. Try to work up to two vegetarian meals, if possible. Eat seafood two or more times a week. Have healthy snacks on hand. These may include: Vegetable sticks with hummus. Greek yogurt. Fruit and nut trail mix. Eat balanced meals. These should include: Fruit: 2-3 servings a day. Vegetables: 4-5 servings a day. Low-fat dairy: 2 servings a day. Fish, poultry, or lean meat: 1 serving a day. Beans and legumes: 2 or more servings a week. Nuts and seeds: 1-2 servings a day. Whole grains: 6-8 servings a day. Extra-virgin olive oil: 3-4 servings a day. Limit red meat and sweets to just a few servings a month. Lifestyle  Try to cook and eat meals with your family. Drink enough fluid to keep your pee (urine) pale yellow. Be active every day. This includes: Aerobic exercise, which is exercise that causes your heart to beat faster. Examples include running and swimming. Leisure activities like gardening, walking, or housework. Get 7-8 hours of sleep each night. Drink red wine if your provider says you can. A glass of wine is 5 oz (150 mL). You may be allowed to have: Up to 1 glass a day if you're female and not pregnant. Up to 2 glasses  a day if you're female. What foods should I eat? Fruits Apples. Apricots. Avocado. Berries. Bananas. Cherries. Dates. Figs. Grapes. Lemons. Melon. Oranges. Peaches. Plums. Pomegranate. Vegetables Artichokes. Beets. Broccoli. Cabbage. Carrots. Eggplant. Green beans. Chard. Kale. Spinach. Onions. Leeks. Peas. Squash. Tomatoes. Peppers. Radishes. Grains Whole-grain pasta. Brown  rice. Bulgur wheat. Polenta. Couscous. Whole-wheat bread. Orpah Cobb. Meats and other proteins Beans. Almonds. Sunflower seeds. Pine nuts. Peanuts. Cod. Salmon. Scallops. Shrimp. Tuna. Tilapia. Clams. Oysters. Eggs. Chicken or [place] without skin. Dairy Low-fat milk. Cheese. Greek yogurt. Fats and oils Extra-virgin olive oil. Avocado oil. Grapeseed oil. Beverages Water. Red wine. Herbal tea. Sweets and desserts Greek yogurt with honey. Baked apples. Poached pears. Trail mix. Seasonings and condiments Basil. Cilantro. Coriander. Cumin. Mint. Parsley. Sage. Rosemary. Tarragon. Garlic. Oregano. Thyme. Pepper. Balsamic vinegar. Tahini. Hummus. Tomato sauce. Olives. Mushrooms. The items listed above may not be all the foods and drinks you can have. Talk to a dietitian to learn more. What foods should I limit? This is a list of foods that should be eaten rarely. Fruits Fruit canned in syrup. Vegetables Deep-fried potatoes, like [place] fries. Grains Packaged pasta or rice dishes. Cereal with added sugar. Snacks with added sugar. Meats and other proteins Beef. Pork. Lamb. Chicken or [place] with skin. Hot dogs. Tomasa Blase. Dairy Ice cream. Sour cream. Whole milk. Fats and oils Butter. Canola oil. Vegetable oil. Beef fat (tallow). Lard. Beverages Juice. Sugar-sweetened soft drinks. Beer. Liquor and spirits. Sweets and desserts Cookies. Cakes. Pies. Candy. Seasonings and condiments Mayonnaise. Pre-made sauces and marinades. The items listed above may not be all the foods and drinks you should limit. Talk to a dietitian to learn more. Where to find more information American Heart Association (AHA): heart.org This information is not intended to replace advice given to you by your health care provider. Make sure you discuss any questions you have with your health care provider. Document Revised: 09/04/2022 Document Reviewed: 09/04/2022 Elsevier Patient Education  2024 [employer].

## 2023-03-09 NOTE — Assessment & Plan Note (Addendum)
Lost 5lbs in last 4weeks with 0.5mg  wegovy Denies any adverse effects with wegovy Exercise: walking at work, no exercise at home Diet: admits to skipping breakfast, eats 2x/day but not balanced. Had appointment with nutritionist in past but does not follow recommendations made. Wt Readings from Last 3 Encounters:  03/09/23 (!) 312 lb 3.2 oz (141.6 kg)  02/09/23 (!) 317 lb 9.6 oz (144.1 kg)  01/11/23 (!) 315 lb 6.4 oz (143.1 kg)    Encouraged to use nutrition plan provided by nutritionist in past: focusing on high protein and high fiber diet. Increased wegovy dose to 1mg  weekly F/up in 63month

## 2023-03-09 NOTE — Progress Notes (Signed)
Established Patient Visit  Patient: Carmen Wall   DOB: Oct 20, 1990   32 y.o. Female  MRN: [id number] Visit Date: 03/09/2023  Subjective:    Chief Complaint  Patient presents with   Follow-up    4 week follow up on weight. No other questions or concerns   HPI Morbid obesity (HCC) Lost 5lbs in last 4weeks with 0.5mg  wegovy Denies any adverse effects with wegovy Exercise: walking at work, no exercise at home Diet: admits to skipping breakfast, eats 2x/day but not balanced. Had appointment with nutritionist in past but does not follow recommendations made. Wt Readings from Last 3 Encounters:  03/09/23 (!) 312 lb 3.2 oz (141.6 kg)  02/09/23 (!) 317 lb 9.6 oz (144.1 kg)  01/11/23 (!) 315 lb 6.4 oz (143.1 kg)    Encouraged to use nutrition plan provided by nutritionist in past: focusing on high protein and high fiber diet. Increased wegovy dose to 1mg  weekly F/up in 34month  Reviewed medical, surgical, and social history today  Medications: Outpatient Medications Prior to Visit  Medication Sig Note   albuterol (VENTOLIN HFA) 108 (90 Base) MCG/ACT inhaler Inhale 1 puff into the lungs every 6 (six) hours as needed for wheezing or shortness of breath.    amLODipine (NORVASC) 5 MG tablet Take 1 tablet (5 mg total) by mouth daily.    cholecalciferol (VITAMIN D3) 25 MCG (1000 UNIT) tablet Take 1,000 Units by mouth daily.    ibuprofen (ADVIL) 600 MG tablet Take 1 tablet (600 mg total) by mouth every 6 (six) hours.    magnesium 30 MG tablet Take 30 mg by mouth 2 (two) times daily.    Prenatal Vit-Fe Fumarate-FA (PREPLUS) 27-1 MG TABS Take 1 tablet by mouth daily.    [DISCONTINUED] Semaglutide-Weight Management (WEGOVY) 0.5 MG/0.5ML SOAJ Inject 0.5 mg into the skin once a week. 03/09/2023: Please send refills to [facility] long    No facility-administered medications prior to visit.   Reviewed past medical and social history.   ROS per HPI above       Objective:  BP 120/79   Pulse 71   Temp 97.9 F (36.6 C) (Temporal)   Ht 5\' 7"  (1.702 m)   Wt (!) 312 lb 3.2 oz (141.6 kg)   LMP 02/01/2023 (Exact Date) Comment: IUD inserted 02/28/2023  SpO2 97%   Breastfeeding No   BMI 48.90 kg/m      Physical Exam Cardiovascular:     Rate and Rhythm: Normal rate.     Pulses: Normal pulses.  Pulmonary:     Effort: Pulmonary effort is normal.  Neurological:     Mental Status: She is alert and oriented to person, place, and time.  Psychiatric:        Mood and Affect: Mood normal.        Behavior: Behavior normal.        Thought Content: Thought content normal.     No results found for any visits on 03/09/23.    Assessment & Plan:    Problem List Items Addressed This Visit     Morbid obesity (HCC) - Primary    Lost 5lbs in last 4weeks with 0.5mg  wegovy Denies any adverse effects with wegovy Exercise: walking at work, no exercise at home Diet: admits to skipping breakfast, eats 2x/day but not balanced. Had appointment with nutritionist in past but does not follow recommendations made. Wt Readings from Last 3 Encounters:  03/09/23 (!) 312 lb 3.2 oz (141.6 kg)  02/09/23 (!) 317 lb 9.6 oz (144.1 kg)  01/11/23 (!) 315 lb 6.4 oz (143.1 kg)    Encouraged to use nutrition plan provided by nutritionist in past: focusing on high protein and high fiber diet. Increased wegovy dose to 1mg  weekly F/up in 34month      Relevant Medications   Semaglutide-Weight Management (WEGOVY) 1 MG/0.5ML SOAJ   Return in about 4 weeks (around 04/06/2023) for Weight management.     Alysia Penna, NP

## 2023-03-14 ENCOUNTER — Other Ambulatory Visit (HOSPITAL_COMMUNITY): Payer: Self-pay

## 2023-04-06 ENCOUNTER — Ambulatory Visit: Payer: Managed Care, Other (non HMO) | Admitting: Nurse Practitioner

## 2023-04-11 ENCOUNTER — Ambulatory Visit: Payer: Managed Care, Other (non HMO) | Admitting: Nurse Practitioner

## 2023-04-11 ENCOUNTER — Encounter: Payer: Self-pay | Admitting: Nurse Practitioner

## 2023-04-11 ENCOUNTER — Other Ambulatory Visit (HOSPITAL_COMMUNITY): Payer: Self-pay

## 2023-04-11 DIAGNOSIS — E559 Vitamin D deficiency, unspecified: Secondary | ICD-10-CM

## 2023-04-11 DIAGNOSIS — I10 Essential (primary) hypertension: Secondary | ICD-10-CM | POA: Diagnosis not present

## 2023-04-11 DIAGNOSIS — E88819 Insulin resistance, unspecified: Secondary | ICD-10-CM | POA: Diagnosis not present

## 2023-04-11 DIAGNOSIS — M7918 Myalgia, other site: Secondary | ICD-10-CM | POA: Diagnosis not present

## 2023-04-11 DIAGNOSIS — F339 Major depressive disorder, recurrent, unspecified: Secondary | ICD-10-CM

## 2023-04-11 DIAGNOSIS — G8929 Other chronic pain: Secondary | ICD-10-CM

## 2023-04-11 MED ORDER — WEGOVY 2.4 MG/0.75ML ~~LOC~~ SOAJ
2.4000 mg | SUBCUTANEOUS | 1 refills | Status: DC
  Filled 2023-04-11: qty 3, 28d supply, fill #0
  Filled 2023-05-09: qty 3, 28d supply, fill #1

## 2023-04-11 MED ORDER — WEGOVY 2.4 MG/0.75ML ~~LOC~~ SOAJ
2.4000 mg | SUBCUTANEOUS | 1 refills | Status: DC
Start: 2023-04-11 — End: 2023-04-11

## 2023-04-11 NOTE — Assessment & Plan Note (Addendum)
Lost 6lbs in last 16month Total weight loss: 11lbs in last 3months Denies any adverse effects with wegovy 1mg  Diet: she has maintain low fat, low sugar, low carb snacks. She has also increased daily protein. Exercise: none at this time. Wt Readings from Last 3 Encounters:  04/11/23 (!) 306 lb 3.2 oz (138.9 kg)  03/09/23 (!) 312 lb 3.2 oz (141.6 kg)  02/09/23 (!) 317 lb 9.6 oz (144.1 kg)    Advised to incorporate daily exercise (cardio and strength training) Increase wegovy dose to 2.4mg  weekly Repeat CMP, hgbA1c, lipid panel in 2weeks (fasting) F/up in 2months

## 2023-04-11 NOTE — Patient Instructions (Signed)
Schedule fasting lab appointment in 2weeks. Need to be fasting 8hrs prior to blood draw. Ok to drink water. maintain Heart healthy diet and daily exercise. Use ibuprofen or tylenol as needed for pain. Monitor BP daily in AM. Send BP readings via mychart in 1week.  How to Increase Your Level of Physical Activity Getting regular physical activity is important for your overall health and well-being. Most people do not get enough exercise. There are easy ways to increase your level of physical activity, even if you have not been very active in the past or if you are just starting out. What are the benefits of physical activity? Physical activity has many short-term and long-term benefits. Being active on a regular basis can improve your physical and mental health as well as provide other benefits. Physical health benefits Helping you lose weight or maintain a healthy weight. Strengthening your muscles and bones. Reducing your risk of certain long-term (chronic) diseases, including heart disease, cancer, and diabetes. Being able to move around more easily and for longer periods of time without getting tired (increased endurance or stamina). Improving your ability to fight off illness (enhanced immunity). Being able to sleep better. Helping you stay healthy as you get older, including: Helping you stay mobile, or capable of walking and moving around. Preventing accidents, such as falls. Increasing life expectancy. Mental health benefits Boosting your mood and improving your self-esteem. Lowering your chance of having mental health problems, such as depression or anxiety. Helping you feel good about your body. Other benefits Finding new sources of fun and enjoyment. Meeting new people who share a common interest. Before you begin If you have a chronic illness or have not been active for a while, check with your health care provider about how to get started. Ask your health care provider what  activities are safe for you. Start out slowly. Walking or doing some simple chair exercises is a good place to start, especially if you have not been active before or for a long time. Set goals that you can work toward. Ask your health care provider how much exercise is best for you. In general, most adults should: Do moderate-intensity exercise for at least 150 minutes each week (30 minutes on most days of the week) or vigorous exercise for at least 75 minutes each week, or a combination of these. Moderate-intensity exercise can include walking at a quick pace, biking, yoga, water aerobics, or gardening. Vigorous exercise involves activities that take more effort, such as jogging or running, playing sports, swimming laps, or jumping rope. Do strength exercises on at least 2 days each week. This can include weight lifting, body weight exercises, and resistance-band exercises. How to be more physically active Make a plan  Try to find activities that you enjoy. You are more likely to commit to an exercise routine if it does not feel like a chore. If you have bone or joint problems, choose low-impact exercises, like walking or swimming. Use these tips for being successful with an exercise plan: Find a workout partner for accountability. Join a group or class, such as an aerobics class, cycling class, or sports team. Make family time active. Go for a walk, bike, or swim. Include a variety of exercises each week. Consider using a fitness tracker, such as a mobile phone app or a device worn like a watch, that will count the number of steps you take each day. Many people strive to reach 10,000 steps a day. Find ways to be active  in your daily routines Besides your formal exercise plans, you can find ways to do physical activity during your daily routines, such as: Walking or biking to work or to the store. Taking the stairs instead of the elevator. Parking farther away from the door at work or at the  store. Planning walking meetings. Walking around while you are on the phone. Where to find more information Centers for Disease Control and Prevention: [employer].pt [employer]'s Council on Fitness, Sports & Nutrition: www.fitness.gov ChooseMyPlate: http://www.harvey.com/ Contact a health care provider if: You have headaches, muscle aches, or joint pain that is concerning. You feel dizzy or light-headed while exercising. You faint. You feel your heart skipping, racing, or fluttering. You have chest pain while exercising. Summary Exercise benefits your mind and body at any age, even if you are just starting out. If you have a chronic illness or have not been active for a while, check with your health care provider before increasing your physical activity. Choose activities that are safe and enjoyable for you. Ask your health care provider what activities are safe for you. Start slowly. Tell your health care provider if you have problems as you start to increase your activity level. This information is not intended to replace advice given to you by your health care provider. Make sure you discuss any questions you have with your health care provider. Document Revised: 09/18/2020 Document Reviewed: 09/18/2020 Elsevier Patient Education  2024 [employer].

## 2023-04-11 NOTE — Progress Notes (Signed)
Established Patient Visit  Patient: Carmen Wall   DOB: 09-14-1990   32 y.o. Female  MRN: [id number] Visit Date: 04/11/2023  Subjective:    Chief Complaint  Patient presents with   office visit     PT is here for 1 month f/u weight management, and depression. PT C/O of joint pain all over for 2 weeks became worse over the last 2 days,    HPI Primary hypertension She stopped taking amlodipine 87month ago due to fear of possible hypotension. She does not check BP at home. BP Readings from Last 3 Encounters:  04/11/23 132/81  03/09/23 120/79  02/09/23 118/82    With elevated DBP, advised about importance of DASH diet and daily exercise. Also advised to start home BP checks in AM. Send via mychart in 1week. Continue to hold amlodipine at this time. F/up in 1-48months  Morbid obesity (HCC) Lost 6lbs in last 87month Total weight loss: 11lbs in last 3months Denies any adverse effects with wegovy 1mg  Diet: she has maintain low fat, low sugar, low carb snacks. She has also increased daily protein. Exercise: none at this time. Wt Readings from Last 3 Encounters:  04/11/23 (!) 306 lb 3.2 oz (138.9 kg)  03/09/23 (!) 312 lb 3.2 oz (141.6 kg)  02/09/23 (!) 317 lb 9.6 oz (144.1 kg)    Advised to incorporate daily exercise (cardio and strength training) Increase wegovy dose to 2.4mg  weekly Repeat CMP, hgbA1c, lipid panel in 2weeks (fasting) F/up in 2months  Depression, recurrent (HCC) Stable mood Denies need for medication and psychology referral  Chronic myofascial pain Generalized muscle and joint pain, waxing and waning, associated with joint stiffness, skin hypersensitivity, and fatigue. Denies any rash or joint swelling or joint erythema or fever or night sweats or unintentional weight loss or swollen lymph nodes. Symptoms improve with ibuprofen 600mg  prn Hx of Vit D: advised to maintain OVER THE COUNTER dose 5000IU daily Hx of anxiety and  depression: reports mood is stable, denies need for medication Denies any insomnia. Previous labs completed: cbc, iron, Tsh, ANA, RH factor, CRP, ESR: all normal. Lumbar spine DG 2019: normal MRI cervical spine 2022: Shallow central disc protrusion at C6-C7 without foraminal or canal stenosis. Straightening with slight reversal of the cervical lordosis, which may be positional or secondary to muscular strain/spasm. Otherwise unremarkable MRI of the cervical spine.  We discussed use of gabapentin vs elavil vs cymbalta vs lyrica, and their possible side effects. Also advised about the importance of daily exercise She opted to work of lifestyle modifications at this time. Advised to continue tylenol 500mg  or ibuprofen 600mg  every 8hrs prn for pain F/up in 2months   Reviewed medical, surgical, and social history today  Medications: Outpatient Medications Prior to Visit  Medication Sig   albuterol (VENTOLIN HFA) 108 (90 Base) MCG/ACT inhaler Inhale 1 puff into the lungs every 6 (six) hours as needed for wheezing or shortness of breath.   cholecalciferol (VITAMIN D3) 25 MCG (1000 UNIT) tablet Take 5,000 Units by mouth daily.   ibuprofen (ADVIL) 600 MG tablet Take 1 tablet (600 mg total) by mouth every 6 (six) hours.   magnesium 30 MG tablet Take 30 mg by mouth 2 (two) times daily.   Prenatal Vit-Fe Fumarate-FA (PREPLUS) 27-1 MG TABS Take 1 tablet by mouth daily.   [DISCONTINUED] Semaglutide-Weight Management (WEGOVY) 1 MG/0.5ML SOAJ Inject 1 mg into the skin once a week.   [  DISCONTINUED] amLODipine (NORVASC) 5 MG tablet Take 1 tablet (5 mg total) by mouth daily. (Patient not taking: Reported on 04/11/2023)   No facility-administered medications prior to visit.   Reviewed past medical and social history.   ROS per HPI above  Last CBC Lab Results  Component Value Date   WBC 6.1 02/09/2023   HGB 13.4 02/09/2023   HCT 40.5 02/09/2023   MCV 88.3 02/09/2023   MCH 30.9 01/04/2023   RDW 14.1  02/09/2023   PLT 200.0 02/09/2023   Last metabolic panel Lab Results  Component Value Date   GLUCOSE 74 02/09/2023   NA 142 02/09/2023   K 4.3 02/09/2023   CL 105 02/09/2023   CO2 29 02/09/2023   BUN 11 02/09/2023   CREATININE 0.79 02/09/2023   GFR 97.85 02/09/2023   CALCIUM 9.2 02/09/2023   PROT 6.8 02/09/2023   ALBUMIN 4.0 02/09/2023   LABGLOB 2.6 09/27/2022   AGRATIO 1.4 09/27/2022   BILITOT 0.4 02/09/2023   ALKPHOS 79 02/09/2023   AST 19 02/09/2023   ALT 25 02/09/2023   ANIONGAP 7 01/04/2023   Last lipids Lab Results  Component Value Date   CHOL 218 (H) 04/22/2020   HDL 47 04/22/2020   LDLCALC 148 (H) 04/22/2020   LDLDIRECT 197.0 02/09/2023   TRIG 127 04/22/2020   CHOLHDL 4 08/24/2018   Last hemoglobin A1c Lab Results  Component Value Date   HGBA1C 5.1 07/04/2022   Last thyroid functions Lab Results  Component Value Date   TSH 1.21 11/09/2021   T3TOTAL 125 07/03/2019   Last vitamin D Lab Results  Component Value Date   VD25OH 21.49 (L) 02/09/2023        Objective:  BP 132/81 (BP Location: Left Arm, Patient Position: Sitting, Cuff Size: Large)   Pulse 77   Temp 98.2 F (36.8 C) (Temporal)   Resp 18   Ht 5\' 7"  (1.702 m)   Wt (!) 306 lb 3.2 oz (138.9 kg)   SpO2 98%   BMI 47.96 kg/m      Physical Exam Vitals and nursing note reviewed.  Cardiovascular:     Rate and Rhythm: Normal rate.     Pulses: Normal pulses.  Pulmonary:     Effort: Pulmonary effort is normal.  Neurological:     Mental Status: She is alert and oriented to person, place, and time.  Psychiatric:        Mood and Affect: Mood normal.        Behavior: Behavior normal.        Thought Content: Thought content normal.     No results found for any visits on 04/11/23.    Assessment & Plan:    Problem List Items Addressed This Visit     Chronic myofascial pain    Generalized muscle and joint pain, waxing and waning, associated with joint stiffness, skin hypersensitivity,  and fatigue. Denies any rash or joint swelling or joint erythema or fever or night sweats or unintentional weight loss or swollen lymph nodes. Symptoms improve with ibuprofen 600mg  prn Hx of Vit D: advised to maintain OVER THE COUNTER dose 5000IU daily Hx of anxiety and depression: reports mood is stable, denies need for medication Denies any insomnia. Previous labs completed: cbc, iron, Tsh, ANA, RH factor, CRP, ESR: all normal. Lumbar spine DG 2019: normal MRI cervical spine 2022: Shallow central disc protrusion at C6-C7 without foraminal or canal stenosis. Straightening with slight reversal of the cervical lordosis, which may be positional or  secondary to muscular strain/spasm. Otherwise unremarkable MRI of the cervical spine.  We discussed use of gabapentin vs elavil vs cymbalta vs lyrica, and their possible side effects. Also advised about the importance of daily exercise She opted to work of lifestyle modifications at this time. Advised to continue tylenol 500mg  or ibuprofen 600mg  every 8hrs prn for pain F/up in 2months       Relevant Orders   Comprehensive metabolic panel   CK   Depression, recurrent (HCC)    Stable mood Denies need for medication and psychology referral      Insulin resistance   Relevant Medications   Semaglutide-Weight Management (WEGOVY) 2.4 MG/0.75ML SOAJ   Other Relevant Orders   Hemoglobin A1c   Morbid obesity (HCC) - Primary    Lost 6lbs in last 30month Total weight loss: 11lbs in last 3months Denies any adverse effects with wegovy 1mg  Diet: she has maintain low fat, low sugar, low carb snacks. She has also increased daily protein. Exercise: none at this time. Wt Readings from Last 3 Encounters:  04/11/23 (!) 306 lb 3.2 oz (138.9 kg)  03/09/23 (!) 312 lb 3.2 oz (141.6 kg)  02/09/23 (!) 317 lb 9.6 oz (144.1 kg)    Advised to incorporate daily exercise (cardio and strength training) Increase wegovy dose to 2.4mg  weekly Repeat CMP, hgbA1c, lipid  panel in 2weeks (fasting) F/up in 2months      Relevant Medications   Semaglutide-Weight Management (WEGOVY) 2.4 MG/0.75ML SOAJ   Other Relevant Orders   Comprehensive metabolic panel   Lipid panel   Primary hypertension    She stopped taking amlodipine 30month ago due to fear of possible hypotension. She does not check BP at home. BP Readings from Last 3 Encounters:  04/11/23 132/81  03/09/23 120/79  02/09/23 118/82    With elevated DBP, advised about importance of DASH diet and daily exercise. Also advised to start home BP checks in AM. Send via mychart in 1week. Continue to hold amlodipine at this time. F/up in 1-23months      Relevant Medications   Semaglutide-Weight Management (WEGOVY) 2.4 MG/0.75ML SOAJ   Other Relevant Orders   Comprehensive metabolic panel   Vitamin D deficiency   Return in about 2 months (around 06/11/2023) for Weight management, HTN.     Alysia Penna, NP

## 2023-04-11 NOTE — Assessment & Plan Note (Signed)
Generalized muscle and joint pain, waxing and waning, associated with joint stiffness, skin hypersensitivity, and fatigue. Denies any rash or joint swelling or joint erythema or fever or night sweats or unintentional weight loss or swollen lymph nodes. Symptoms improve with ibuprofen 600mg  prn Hx of Vit D: advised to maintain OVER THE COUNTER dose 5000IU daily Hx of anxiety and depression: reports mood is stable, denies need for medication Denies any insomnia. Previous labs completed: cbc, iron, Tsh, ANA, RH factor, CRP, ESR: all normal. Lumbar spine DG 2019: normal MRI cervical spine 2022: Shallow central disc protrusion at C6-C7 without foraminal or canal stenosis. Straightening with slight reversal of the cervical lordosis, which may be positional or secondary to muscular strain/spasm. Otherwise unremarkable MRI of the cervical spine.  We discussed use of gabapentin vs elavil vs cymbalta vs lyrica, and their possible side effects. Also advised about the importance of daily exercise She opted to work of lifestyle modifications at this time. Advised to continue tylenol 500mg  or ibuprofen 600mg  every 8hrs prn for pain F/up in 2months

## 2023-04-11 NOTE — Assessment & Plan Note (Signed)
She stopped taking amlodipine 39month ago due to fear of possible hypotension. She does not check BP at home. BP Readings from Last 3 Encounters:  04/11/23 132/81  03/09/23 120/79  02/09/23 118/82    With elevated DBP, advised about importance of DASH diet and daily exercise. Also advised to start home BP checks in AM. Send via mychart in 1week. Continue to hold amlodipine at this time. F/up in 1-24months

## 2023-04-11 NOTE — Assessment & Plan Note (Signed)
Stable mood Denies need for medication and psychology referral

## 2023-04-12 ENCOUNTER — Other Ambulatory Visit (HOSPITAL_COMMUNITY): Payer: Self-pay

## 2023-05-09 ENCOUNTER — Other Ambulatory Visit (HOSPITAL_COMMUNITY): Payer: Self-pay

## 2023-06-07 ENCOUNTER — Other Ambulatory Visit: Payer: Self-pay | Admitting: Nurse Practitioner

## 2023-06-07 DIAGNOSIS — I10 Essential (primary) hypertension: Secondary | ICD-10-CM

## 2023-06-07 DIAGNOSIS — E88819 Insulin resistance, unspecified: Secondary | ICD-10-CM

## 2023-06-12 ENCOUNTER — Other Ambulatory Visit (HOSPITAL_COMMUNITY): Payer: Self-pay

## 2023-06-12 ENCOUNTER — Other Ambulatory Visit: Payer: Self-pay

## 2023-06-12 DIAGNOSIS — E88819 Insulin resistance, unspecified: Secondary | ICD-10-CM

## 2023-06-12 DIAGNOSIS — I10 Essential (primary) hypertension: Secondary | ICD-10-CM

## 2023-06-12 MED ORDER — WEGOVY 2.4 MG/0.75ML ~~LOC~~ SOAJ
2.4000 mg | SUBCUTANEOUS | 0 refills | Status: DC
  Filled 2023-06-12: qty 3, 28d supply, fill #0

## 2023-06-12 NOTE — Telephone Encounter (Signed)
 Copied from CRM [id number]. Topic: Clinical - Prescription Issue >> Jun 12, 2023  8:46 AM Deaijah H wrote: Reason for CRM: Patients mom Jori Gains called in due to Meds refused due to needing an appointment but have an appointment 06/13/2022 // call back # [phone]   Patients mother notified and refill on Wegovy  sent to the pharmacy. Dm/cma

## 2023-06-14 ENCOUNTER — Ambulatory Visit: Payer: Managed Care, Other (non HMO) | Admitting: Nurse Practitioner

## 2023-06-14 ENCOUNTER — Encounter: Payer: Self-pay | Admitting: Nurse Practitioner

## 2023-06-14 DIAGNOSIS — M7918 Myalgia, other site: Secondary | ICD-10-CM | POA: Diagnosis not present

## 2023-06-14 DIAGNOSIS — G8929 Other chronic pain: Secondary | ICD-10-CM

## 2023-06-14 DIAGNOSIS — E559 Vitamin D deficiency, unspecified: Secondary | ICD-10-CM

## 2023-06-14 DIAGNOSIS — E88819 Insulin resistance, unspecified: Secondary | ICD-10-CM | POA: Diagnosis not present

## 2023-06-14 DIAGNOSIS — F331 Major depressive disorder, recurrent, moderate: Secondary | ICD-10-CM

## 2023-06-14 DIAGNOSIS — D500 Iron deficiency anemia secondary to blood loss (chronic): Secondary | ICD-10-CM | POA: Diagnosis not present

## 2023-06-14 LAB — LIPID PANEL
Cholesterol: 191 mg/dL (ref 0–200)
HDL: 33.8 mg/dL — ABNORMAL LOW (ref >39.00)
LDL Cholesterol: 130 mg/dL — ABNORMAL HIGH (ref 0–99)
NonHDL: 157.69
Total CHOL/HDL Ratio: 6
Triglycerides: 136 mg/dL (ref 0.0–149.0)
VLDL: 27.2 mg/dL (ref 0.0–40.0)

## 2023-06-14 LAB — COMPREHENSIVE METABOLIC PANEL
ALT: 14 U/L (ref 0–35)
AST: 11 U/L (ref 0–37)
Albumin: 4.2 g/dL (ref 3.5–5.2)
Alkaline Phosphatase: 61 U/L (ref 39–117)
BUN: 12 mg/dL (ref 6–23)
CO2: 29 meq/L (ref 19–32)
Calcium: 9.1 mg/dL (ref 8.4–10.5)
Chloride: 104 meq/L (ref 96–112)
Creatinine, Ser: 0.65 mg/dL (ref 0.40–1.20)
GFR: 114.9 mL/min (ref >60.00)
Glucose, Bld: 81 mg/dL (ref 70–99)
Potassium: 4 meq/L (ref 3.5–5.1)
Sodium: 139 meq/L (ref 135–145)
Total Bilirubin: 0.6 mg/dL (ref 0.2–1.2)
Total Protein: 6.6 g/dL (ref 6.0–8.3)

## 2023-06-14 LAB — TSH: TSH: 0.69 u[IU]/mL (ref 0.35–5.50)

## 2023-06-14 LAB — CK: Total CK: 50 U/L (ref 7–177)

## 2023-06-14 LAB — HEMOGLOBIN A1C: Hgb A1c MFr Bld: 5.1 % (ref 4.6–6.5)

## 2023-06-14 MED ORDER — PREPLUS 27-1 MG PO TABS
1.0000 | ORAL_TABLET | Freq: Every day | ORAL | 1 refills | Status: DC
Start: 2023-06-14 — End: 2024-03-11

## 2023-06-14 MED ORDER — BUPROPION HCL 75 MG PO TABS: 75.0000 mg | ORAL_TABLET | Freq: Two times a day (BID) | ORAL | 5 refills | Status: DC

## 2023-06-14 NOTE — Patient Instructions (Addendum)
 Go to lab Start wellbutrin  1tan in Am x 7days, then 1tab in Am and PM continuously Try naproxen  OVER THE COUNTER for menstrual cramps Take meds with food.  Managing Depression, Adult Depression is a mental health condition that affects your thoughts, feelings, and actions. Being diagnosed with depression can bring you relief if you did not know why you have felt or behaved a certain way. It could also leave you feeling overwhelmed. Finding ways to manage your symptoms can help you feel more positive about your future. How to manage lifestyle changes Being depressed is difficult. Depression can increase the level of everyday stress. Stress can make depression symptoms worse. You may believe your symptoms cannot be managed or will never improve. However, there are many things you can try to help manage your symptoms. There is hope. Managing stress  Stress is your body's reaction to life changes and events, both good and bad. Stress can add to your feelings of depression. Learning to manage your stress can help lessen your feelings of depression. Try some of the following approaches to reducing your stress (stress reduction techniques): Listen to music that you enjoy and that inspires you. Try using a meditation app or take a meditation class. Develop a practice that helps you connect with your spiritual self. Walk in nature, pray, or go to a place of worship. Practice deep breathing. To do this, inhale slowly through your nose. Pause at the top of your inhale for a few seconds and then exhale slowly, letting yourself relax. Repeat this three or four times. Practice yoga to help relax and work your muscles. Choose a stress reduction technique that works for you. These techniques take time and practice to develop. Set aside 5-15 minutes a day to do them. Therapists can offer training in these techniques. Do these things to help manage stress: Keep a journal. Know your limits. Set healthy boundaries  for yourself and others, such as saying no when you think something is too much. Pay attention to how you react to certain situations. You may not be able to control everything, but you can change your reaction. Add humor to your life by watching funny movies or shows. Make time for activities that you enjoy and that relax you. Spend less time using electronics, especially at night before bed. The light from screens can make your brain think it is time to get up rather than go to bed.  Medicines Medicines, such as antidepressants, are often a part of treatment for depression. Talk with your pharmacist or health care provider about all the medicines, supplements, and herbal products that you take, their possible side effects, and what medicines and other products are safe to take together. Make sure to report any side effects you may have to your health care provider. Relationships Your health care provider may suggest family therapy, couples therapy, or individual therapy as part of your treatment. How to recognize changes Everyone responds differently to treatment for depression. As you recover from depression, you may start to: Have more interest in doing activities. Feel more hopeful. Have more energy. Eat a more regular amount of food. Have better mental focus. It is important to recognize if your depression is not getting better or is getting worse. The symptoms you had in the beginning may return, such as: Feeling tired. Eating too much or too little. Sleeping too much or too little. Feeling restless, agitated, or hopeless. Trouble focusing or making decisions. Having unexplained aches and pains. Feeling irritable,  angry, or aggressive. If you or your family members notice these symptoms coming back, let your health care provider know right away. Follow these instructions at home: Activity Try to get some form of exercise each day, such as walking. Try yoga, mindfulness, or  other stress reduction techniques. Participate in group activities if you are able. Lifestyle Get enough sleep. Cut down on or stop using caffeine, tobacco, alcohol, and any other harmful substances. Eat a healthy diet that includes plenty of vegetables, fruits, whole grains, low-fat dairy products, and lean protein. Limit foods that are high in solid fats, added sugar, or salt (sodium). General instructions Take over-the-counter and prescription medicines only as told by your health care provider. Keep all follow-up visits. It is important for your health care provider to check on your mood, behavior, and medicines. Your health care provider may need to make changes to your treatment. Where to find support Talking to others  Friends and family members can be sources of support and guidance. Talk to trusted friends or family members about your condition. Explain your symptoms and let them know that you are working with a health care provider to treat your depression. Tell friends and family how they can help. Finances Find mental health providers that fit with your financial situation. Talk with your health care provider if you are worried about access to food, housing, or medicine. Call your insurance company to learn about your co-pays and prescription plan. Where to find more information You can find support in your area from: Anxiety and Depression Association of America (ADAA): adaa.org Mental Health [place]: mentalhealthamerica.net [employer] on Mental Illness: nami.org Contact a health care provider if: You stop taking your antidepressant medicines, and you have any of these symptoms: Nausea. Headache. Light-headedness. Chills and body aches. Not being able to sleep (insomnia). You or your friends and family think your depression is getting worse. Get help right away if: You have thoughts of hurting yourself or others. Get help right away if you feel like you may hurt  yourself or others, or have thoughts about taking your own life. Go to your nearest emergency room or: Call 911. Call the National Suicide Prevention Lifeline at 574 392 6727 or 988. This is open 24 hours a day. Text the Crisis Text Line at 780-063-5082. This information is not intended to replace advice given to you by your health care provider. Make sure you discuss any questions you have with your health care provider. Document Revised: 09/28/2021 Document Reviewed: 09/28/2021 Elsevier Patient Education  2024 [employer].

## 2023-06-14 NOTE — Assessment & Plan Note (Addendum)
 Worsening irritability, easily overwhelmed, and lack of motivation. No SI/HI/hallucination. No improvement with previous therapy sessions. She prefers not to resume sessions. Previous use  on trintellix , fluoxetine , vistaril , buspar , trazodone , and wellbutrin . Med discontinued in last due to pregnancy.  Start wellbutrin  75mg  BID F/up in 40month

## 2023-06-14 NOTE — Progress Notes (Signed)
 Established Patient Visit  Patient: Carmen Wall   DOB: Dec 02, 1990   33 y.o. Female  MRN: [id number] Visit Date: 06/14/2023  Subjective:    Chief Complaint  Patient presents with   medical managment of chronic issues     2 month f/u hypertension and weight management    HPI Morbid obesity (HCC) Lost 9lbs in last 2months Total weight loss: 20lbs in 5months Diet: decreased portion, low fat, continues to struggle with sugar and carb cravings around menstrual cycle. Exercise: walking daily, difficulty with increasing exercise due to work schedule and home responsibilities Denies any adverse effects with wegovy  2.4mg  Wt Readings from Last 3 Encounters:  06/14/23 297 lb 12.8 oz (135.1 kg)  04/11/23 (!) 306 lb 3.2 oz (138.9 kg)  03/09/23 (!) 312 lb 3.2 oz (141.6 kg)    Maintain wegovy  dose Encouraged to maintain high protein and high fiber diet Maintain 10,000steps daily Check CMP, lipid panel and hgba1c F/up in 63month  Depression, recurrent (HCC) Worsening irritability, easily overwhelmed, and lack of motivation. No SI/HI/hallucination. No improvement with previous therapy sessions. She prefers not to resume sessions. Previous use  on trintellix , fluoxetine , vistaril , buspar , trazodone , and wellbutrin . Med discontinued in last due to pregnancy.  Start wellbutrin  75mg  BID F/up in 63month  Reviewed medical, surgical, and social history today  Medications: Outpatient Medications Prior to Visit  Medication Sig   albuterol  (VENTOLIN  HFA) 108 (90 Base) MCG/ACT inhaler Inhale 1 puff into the lungs every 6 (six) hours as needed for wheezing or shortness of breath.   cholecalciferol (VITAMIN D3) 25 MCG (1000 UNIT) tablet Take 5,000 Units by mouth daily.   ibuprofen  (ADVIL ) 600 MG tablet Take 1 tablet (600 mg total) by mouth every 6 (six) hours.   magnesium 30 MG tablet Take 30 mg by mouth 2 (two) times daily.   Semaglutide -Weight Management (WEGOVY ) 2.4  MG/0.75ML SOAJ Inject 2.4 mg into the skin once a week.   [DISCONTINUED] Prenatal Vit-Fe Fumarate-FA (PREPLUS) 27-1 MG TABS Take 1 tablet by mouth daily.   No facility-administered medications prior to visit.   Reviewed past medical and social history.   ROS per HPI above      Objective:  BP 133/76 (BP Location: Left Arm, Patient Position: Sitting, Cuff Size: Large)   Pulse 78   Temp 98.5 F (36.9 C) (Temporal)   Resp 18   Ht 5' 7 (1.702 m)   Wt 297 lb 12.8 oz (135.1 kg)   LMP  (LMP Unknown) Comment: IUD  SpO2 97%   Breastfeeding No   BMI 46.64 kg/m      Physical Exam Vitals and nursing note reviewed.  Cardiovascular:     Rate and Rhythm: Normal rate.     Pulses: Normal pulses.  Pulmonary:     Effort: Pulmonary effort is normal.  Neurological:     Mental Status: She is alert and oriented to person, place, and time.  Psychiatric:        Behavior: Behavior normal.        Thought Content: Thought content normal.     No results found for any visits on 06/14/23.    Assessment & Plan:    Problem List Items Addressed This Visit     Chronic myofascial pain   Relevant Medications   buPROPion  (WELLBUTRIN ) 75 MG tablet   Other Relevant Orders   CK   TSH   Depression, recurrent (HCC)  Worsening irritability, easily overwhelmed, and lack of motivation. No SI/HI/hallucination. No improvement with previous therapy sessions. She prefers not to resume sessions. Previous use  on trintellix , fluoxetine , vistaril , buspar , trazodone , and wellbutrin . Med discontinued in last due to pregnancy.  Start wellbutrin  75mg  BID F/up in 70month      Relevant Medications   buPROPion  (WELLBUTRIN ) 75 MG tablet   Insulin  resistance   Relevant Orders   Hemoglobin A1c   Iron deficiency anemia due to chronic blood loss   Relevant Medications   Prenatal Vit-Fe Fumarate-FA (PREPLUS) 27-1 MG TABS   Morbid obesity (HCC) - Primary   Lost 9lbs in last 2months Total weight loss: 20lbs in  5months Diet: decreased portion, low fat, continues to struggle with sugar and carb cravings around menstrual cycle. Exercise: walking daily, difficulty with increasing exercise due to work schedule and home responsibilities Denies any adverse effects with wegovy  2.4mg  Wt Readings from Last 3 Encounters:  06/14/23 297 lb 12.8 oz (135.1 kg)  04/11/23 (!) 306 lb 3.2 oz (138.9 kg)  03/09/23 (!) 312 lb 3.2 oz (141.6 kg)    Maintain wegovy  dose Encouraged to maintain high protein and high fiber diet Maintain 10,000steps daily Check CMP, lipid panel and hgba1c F/up in 70month      Relevant Orders   Lipid panel   Hemoglobin A1c   Comprehensive metabolic panel   Vitamin D  deficiency   Relevant Medications   Prenatal Vit-Fe Fumarate-FA (PREPLUS) 27-1 MG TABS   Return in about 4 weeks (around 07/12/2023) for Weight management, depression and anxiety.     Roselie Mood, NP

## 2023-06-14 NOTE — Assessment & Plan Note (Addendum)
 Lost 9lbs in last 2months Total weight loss: 20lbs in 5months Diet: decreased portion, low fat, continues to struggle with sugar and carb cravings around menstrual cycle. Exercise: walking daily, difficulty with increasing exercise due to work schedule and home responsibilities Denies any adverse effects with wegovy  2.4mg  Wt Readings from Last 3 Encounters:  06/14/23 297 lb 12.8 oz (135.1 kg)  04/11/23 (!) 306 lb 3.2 oz (138.9 kg)  03/09/23 (!) 312 lb 3.2 oz (141.6 kg)    Maintain wegovy  dose Encouraged to maintain high protein and high fiber diet Maintain 10,000steps daily Check CMP, lipid panel and hgba1c F/up in 40month

## 2023-07-17 ENCOUNTER — Other Ambulatory Visit (HOSPITAL_COMMUNITY): Payer: Self-pay

## 2023-07-17 ENCOUNTER — Other Ambulatory Visit: Payer: Self-pay | Admitting: Nurse Practitioner

## 2023-07-17 DIAGNOSIS — I10 Essential (primary) hypertension: Secondary | ICD-10-CM

## 2023-07-17 DIAGNOSIS — E88819 Insulin resistance, unspecified: Secondary | ICD-10-CM

## 2023-07-20 ENCOUNTER — Other Ambulatory Visit (HOSPITAL_COMMUNITY): Payer: Self-pay

## 2023-07-21 ENCOUNTER — Encounter: Payer: Self-pay | Admitting: Nurse Practitioner

## 2023-07-21 ENCOUNTER — Other Ambulatory Visit (HOSPITAL_COMMUNITY): Payer: Self-pay

## 2023-07-21 ENCOUNTER — Ambulatory Visit: Payer: Managed Care, Other (non HMO) | Admitting: Nurse Practitioner

## 2023-07-21 DIAGNOSIS — I10 Essential (primary) hypertension: Secondary | ICD-10-CM | POA: Diagnosis not present

## 2023-07-21 DIAGNOSIS — F3341 Major depressive disorder, recurrent, in partial remission: Secondary | ICD-10-CM

## 2023-07-21 DIAGNOSIS — E88819 Insulin resistance, unspecified: Secondary | ICD-10-CM

## 2023-07-21 DIAGNOSIS — Z975 Presence of (intrauterine) contraceptive device: Secondary | ICD-10-CM | POA: Insufficient documentation

## 2023-07-21 MED ORDER — WEGOVY 2.4 MG/0.75ML ~~LOC~~ SOAJ
2.4000 mg | SUBCUTANEOUS | 2 refills | Status: DC
  Filled 2023-07-21: qty 3, 28d supply, fill #0
  Filled 2023-08-29 – 2023-08-31 (×2): qty 3, 28d supply, fill #1
  Filled 2023-10-11: qty 3, 28d supply, fill #2

## 2023-07-21 MED ORDER — BUPROPION HCL ER (XL) 150 MG PO TB24
150.0000 mg | ORAL_TABLET | Freq: Every day | ORAL | 3 refills | Status: DC
Start: 2023-07-21 — End: 2023-12-28

## 2023-07-21 NOTE — Assessment & Plan Note (Signed)
BP at goal without med BP Readings from Last 3 Encounters:  07/21/23 120/70  06/14/23 133/76  04/11/23 132/81    F/up in 2months

## 2023-07-21 NOTE — Assessment & Plan Note (Addendum)
Lost 2lbs in last 71month Denies any adverse effects with wegovy 2.4mg  Exercise: walking daily Diet: started meal prep to avoid eating out. Wt Readings from Last 3 Encounters:  07/21/23 295 lb (133.8 kg)  06/14/23 297 lb 12.8 oz (135.1 kg)  04/11/23 (!) 306 lb 3.2 oz (138.9 kg)    Encouraged to maintain heart healthy diet and daily exercise Maintain med dose F/up in 2months

## 2023-07-21 NOTE — Assessment & Plan Note (Signed)
Improved and stable mood with wellbutrin 75mg  BID Denies any adverse effects Agreed to switch med to 150mg  XL daily. New rx sent F/up in 2months

## 2023-07-21 NOTE — Patient Instructions (Signed)
Maintain Heart healthy diet and daily exercise. Maintain current medications.

## 2023-07-21 NOTE — Progress Notes (Signed)
Established Patient Visit  Patient: Carmen Wall   DOB: Jan 29, 1991   33 y.o. Female  MRN: [id number] Visit Date: 07/21/2023  Subjective:    Chief Complaint  Patient presents with   Medication Management    Wellbutrin, Wegovy   HPI Morbid obesity (HCC) Lost 2lbs in last 46month Denies any adverse effects with wegovy 2.4mg  Exercise: walking daily Diet: started meal prep to avoid eating out. Wt Readings from Last 3 Encounters:  07/21/23 295 lb (133.8 kg)  06/14/23 297 lb 12.8 oz (135.1 kg)  04/11/23 (!) 306 lb 3.2 oz (138.9 kg)    Encouraged to maintain heart healthy diet and daily exercise Maintain med dose F/up in 2months  Depression, recurrent (HCC) Improved and stable mood with wellbutrin 75mg  BID Denies any adverse effects Agreed to switch med to 150mg  XL daily. New rx sent F/up in 2months  Primary hypertension BP at goal without med BP Readings from Last 3 Encounters:  07/21/23 120/70  06/14/23 133/76  04/11/23 132/81    F/up in 2months  Reviewed medical, surgical, and social history today  Medications: Outpatient Medications Prior to Visit  Medication Sig   albuterol (VENTOLIN HFA) 108 (90 Base) MCG/ACT inhaler Inhale 1 puff into the lungs every 6 (six) hours as needed for wheezing or shortness of breath.   cholecalciferol (VITAMIN D3) 25 MCG (1000 UNIT) tablet Take 5,000 Units by mouth daily.   ibuprofen (ADVIL) 600 MG tablet Take 1 tablet (600 mg total) by mouth every 6 (six) hours.   magnesium 30 MG tablet Take 30 mg by mouth 2 (two) times daily.   Prenatal Vit-Fe Fumarate-FA (PREPLUS) 27-1 MG TABS Take 1 tablet by mouth daily.   [DISCONTINUED] buPROPion (WELLBUTRIN) 75 MG tablet Take 1 tablet (75 mg total) by mouth 2 (two) times daily.   [DISCONTINUED] Semaglutide-Weight Management (WEGOVY) 2.4 MG/0.75ML SOAJ Inject 2.4 mg into the skin once a week.   No facility-administered medications prior to visit.   Reviewed past  medical and social history.   ROS per HPI above      Objective:  BP 120/70 (Cuff Size: Large)   Pulse 77   Temp 98 F (36.7 C)   Ht 5\' 7"  (1.702 m)   Wt 295 lb (133.8 kg)   LMP 07/19/2023 (Exact Date)   SpO2 98%   Breastfeeding No   BMI 46.20 kg/m      Physical Exam Vitals and nursing note reviewed.  Cardiovascular:     Rate and Rhythm: Normal rate.     Pulses: Normal pulses.  Pulmonary:     Effort: Pulmonary effort is normal.  Neurological:     Mental Status: She is alert and oriented to person, place, and time.     No results found for any visits on 07/21/23.    Assessment & Plan:    Problem List Items Addressed This Visit     Depression, recurrent (HCC)   Improved and stable mood with wellbutrin 75mg  BID Denies any adverse effects Agreed to switch med to 150mg  XL daily. New rx sent F/up in 2months      Relevant Medications   buPROPion (WELLBUTRIN XL) 150 MG 24 hr tablet   Insulin resistance   Relevant Medications   Semaglutide-Weight Management (WEGOVY) 2.4 MG/0.75ML SOAJ   Morbid obesity (HCC) - Primary   Lost 2lbs in last 46month Denies any adverse effects with wegovy 2.4mg  Exercise: walking daily Diet: started  meal prep to avoid eating out. Wt Readings from Last 3 Encounters:  07/21/23 295 lb (133.8 kg)  06/14/23 297 lb 12.8 oz (135.1 kg)  04/11/23 (!) 306 lb 3.2 oz (138.9 kg)    Encouraged to maintain heart healthy diet and daily exercise Maintain med dose F/up in 2months      Relevant Medications   Semaglutide-Weight Management (WEGOVY) 2.4 MG/0.75ML SOAJ   Primary hypertension   BP at goal without med BP Readings from Last 3 Encounters:  07/21/23 120/70  06/14/23 133/76  04/11/23 132/81    F/up in 2months      Relevant Medications   Semaglutide-Weight Management (WEGOVY) 2.4 MG/0.75ML SOAJ   Return in about 2 months (around 09/18/2023) for Weight management, hyperlipidemia (fasting).     Alysia Penna, NP

## 2023-07-26 ENCOUNTER — Other Ambulatory Visit (HOSPITAL_COMMUNITY): Payer: Self-pay

## 2023-08-29 ENCOUNTER — Other Ambulatory Visit (HOSPITAL_COMMUNITY): Payer: Self-pay

## 2023-08-31 ENCOUNTER — Other Ambulatory Visit (HOSPITAL_COMMUNITY): Payer: Self-pay

## 2023-09-04 ENCOUNTER — Other Ambulatory Visit (HOSPITAL_COMMUNITY): Payer: Self-pay

## 2023-09-04 ENCOUNTER — Telehealth: Payer: Self-pay

## 2023-09-04 NOTE — Telephone Encounter (Signed)
 Pharmacy Patient Advocate Encounter   Received notification from Onbase that prior authorization for [facility] 2.4MG /0.75ML auto-injectors is required/requested.   Insurance verification completed.   The patient is insured through [employer] .   Per test claim: PA required; PA started via CoverMyMeds. KEY [id number] . Waiting for clinical questions to populate.

## 2023-09-04 NOTE — Telephone Encounter (Signed)
 Pharmacy Patient Advocate Encounter  Received notification from [employer] that Prior Authorization for Wegovy 2.4MG /0.75ML auto-injectors has been APPROVED from 09/04/23 to 09/03/24. Ran test claim, Copay is $0. This test claim was processed through [facility]- copay amounts may vary at other pharmacies due to pharmacy/plan contracts, or as the patient moves through the different stages of their insurance plan.   PA #/Case ID/Reference #: [id number]

## 2023-09-04 NOTE — Telephone Encounter (Signed)
 Pharmacy Patient Advocate Encounter   Received notification from CoverMyMeds that prior authorization for [facility] 2.4MG /0.75ML auto-injectors is required/requested.   Insurance verification completed.   The patient is insured through [employer] .   Per test claim: PA required; PA submitted to above mentioned insurance via CoverMyMeds Key/confirmation #/EOC [id number] Status is pending

## 2023-10-05 ENCOUNTER — Ambulatory Visit: Admitting: Nurse Practitioner

## 2023-10-24 ENCOUNTER — Encounter: Payer: Self-pay | Admitting: Nurse Practitioner

## 2023-10-24 ENCOUNTER — Other Ambulatory Visit (HOSPITAL_COMMUNITY): Payer: Self-pay

## 2023-10-24 ENCOUNTER — Ambulatory Visit: Admitting: Nurse Practitioner

## 2023-10-24 DIAGNOSIS — E782 Mixed hyperlipidemia: Secondary | ICD-10-CM | POA: Diagnosis not present

## 2023-10-24 DIAGNOSIS — Z6841 Body Mass Index (BMI) 40.0 and over, adult: Secondary | ICD-10-CM

## 2023-10-24 DIAGNOSIS — E88819 Insulin resistance, unspecified: Secondary | ICD-10-CM

## 2023-10-24 DIAGNOSIS — I10 Essential (primary) hypertension: Secondary | ICD-10-CM

## 2023-10-24 DIAGNOSIS — F339 Major depressive disorder, recurrent, unspecified: Secondary | ICD-10-CM | POA: Diagnosis not present

## 2023-10-24 LAB — RENAL FUNCTION PANEL
Albumin: 4.2 g/dL (ref 3.5–5.2)
BUN: 12 mg/dL (ref 6–23)
CO2: 25 meq/L (ref 19–32)
Calcium: 9 mg/dL (ref 8.4–10.5)
Chloride: 107 meq/L (ref 96–112)
Creatinine, Ser: 0.8 mg/dL (ref 0.40–1.20)
GFR: 95.91 mL/min (ref >60.00)
Glucose, Bld: 84 mg/dL (ref 70–99)
Phosphorus: 3.2 mg/dL (ref 2.3–4.6)
Potassium: 3.6 meq/L (ref 3.5–5.1)
Sodium: 141 meq/L (ref 135–145)

## 2023-10-24 LAB — LDL CHOLESTEROL, DIRECT: Direct LDL: 122 mg/dL

## 2023-10-24 MED ORDER — WEGOVY 2.4 MG/0.75ML ~~LOC~~ SOAJ
2.4000 mg | SUBCUTANEOUS | 2 refills | Status: DC
  Filled 2023-10-24 – 2023-11-17 (×2): qty 3, 28d supply, fill #0

## 2023-10-24 MED ORDER — WEGOVY 2.4 MG/0.75ML ~~LOC~~ SOAJ: 2.4000 mg | SUBCUTANEOUS | 2 refills | Status: DC

## 2023-10-24 NOTE — Patient Instructions (Signed)
 Go to lab Maintain Heart healthy diet and daily exercise. Maintain current medications.  Mediterranean Diet A Mediterranean diet is based on the traditions of countries on the [employer]. It focuses on eating more: Fruits and vegetables. Whole grains, beans, nuts, and seeds. Heart-healthy fats. These are fats that are good for your heart. It involves eating less: Dairy. Meat and eggs. Processed foods with added sugar, salt, and fat. This type of diet can help prevent certain conditions. It can also improve outcomes if you have a long-term (chronic) disease, such as kidney or heart disease. What are tips for following this plan? Reading food labels Check packaged foods for: The serving size. For foods such as rice and pasta, the serving size is the amount of cooked product, not dry. The total fat. Avoid foods with saturated fat or trans fat. Added sugars, such as corn syrup. Shopping  Try to have a balanced diet. Buy a variety of foods, such as: Fresh fruits and vegetables. You may be able to get these from local farmers markets. You can also buy them frozen. Grains, beans, nuts, and seeds. Some of these can be bought in bulk. Fresh seafood. Poultry and eggs. Low-fat dairy products. Buy whole ingredients instead of foods that have already been packaged. If you can't get fresh seafood, buy precooked frozen shrimp or canned fish, such as tuna, salmon, or sardines. Stock your pantry so you always have certain foods on hand, such as olive oil, canned tuna, canned tomatoes, rice, pasta, and beans. Cooking Cook foods with extra-virgin olive oil instead of using butter or other vegetable oils. Have meat as a side dish. Have vegetables or grains as your main dish. This means having meat in small portions or adding small amounts of meat to foods like pasta or stew. Use beans or vegetables instead of meat in common dishes like chili or lasagna. Try out different cooking methods. Try  roasting, broiling, steaming, and sauting vegetables. Add frozen vegetables to soups, stews, pasta, or rice. Add nuts or seeds for added healthy fats and plant protein at each meal. You can add these to yogurt, salads, or vegetable dishes. Marinate fish or vegetables using olive oil, lemon juice, garlic, and fresh herbs. Meal planning Plan to eat a vegetarian meal one day each week. Try to work up to two vegetarian meals, if possible. Eat seafood two or more times a week. Have healthy snacks on hand. These may include: Vegetable sticks with hummus. Greek yogurt. Fruit and nut trail mix. Eat balanced meals. These should include: Fruit: 2-3 servings a day. Vegetables: 4-5 servings a day. Low-fat dairy: 2 servings a day. Fish, poultry, or lean meat: 1 serving a day. Beans and legumes: 2 or more servings a week. Nuts and seeds: 1-2 servings a day. Whole grains: 6-8 servings a day. Extra-virgin olive oil: 3-4 servings a day. Limit red meat and sweets to just a few servings a month. Lifestyle  Try to cook and eat meals with your family. Drink enough fluid to keep your pee (urine) pale yellow. Be active every day. This includes: Aerobic exercise, which is exercise that causes your heart to beat faster. Examples include running and swimming. Leisure activities like gardening, walking, or housework. Get 7-8 hours of sleep each night. Drink red wine if your provider says you can. A glass of wine is 5 oz (150 mL). You may be allowed to have: Up to 1 glass a day if you're female and not pregnant. Up to 2 glasses  a day if you're female. What foods should I eat? Fruits Apples. Apricots. Avocado. Berries. Bananas. Cherries. Dates. Figs. Grapes. Lemons. Melon. Oranges. Peaches. Plums. Pomegranate. Vegetables Artichokes. Beets. Broccoli. Cabbage. Carrots. Eggplant. Green beans. Chard. Kale. Spinach. Onions. Leeks. Peas. Squash. Tomatoes. Peppers. Radishes. Grains Whole-grain pasta. Brown rice.  Bulgur wheat. Polenta. Couscous. Whole-wheat bread. Orpah Cobb. Meats and other proteins Beans. Almonds. Sunflower seeds. Pine nuts. Peanuts. Cod. Salmon. Scallops. Shrimp. Tuna. Tilapia. Clams. Oysters. Eggs. Chicken or [place] without skin. Dairy Low-fat milk. Cheese. Greek yogurt. Fats and oils Extra-virgin olive oil. Avocado oil. Grapeseed oil. Beverages Water. Red wine. Herbal tea. Sweets and desserts Greek yogurt with honey. Baked apples. Poached pears. Trail mix. Seasonings and condiments Basil. Cilantro. Coriander. Cumin. Mint. Parsley. Sage. Rosemary. Tarragon. Garlic. Oregano. Thyme. Pepper. Balsamic vinegar. Tahini. Hummus. Tomato sauce. Olives. Mushrooms. The items listed above may not be all the foods and drinks you can have. Talk to a dietitian to learn more. What foods should I limit? This is a list of foods that should be eaten rarely. Fruits Fruit canned in syrup. Vegetables Deep-fried potatoes, like [place] fries. Grains Packaged pasta or rice dishes. Cereal with added sugar. Snacks with added sugar. Meats and other proteins Beef. Pork. Lamb. Chicken or [place] with skin. Hot dogs. Tomasa Blase. Dairy Ice cream. Sour cream. Whole milk. Fats and oils Butter. Canola oil. Vegetable oil. Beef fat (tallow). Lard. Beverages Juice. Sugar-sweetened soft drinks. Beer. Liquor and spirits. Sweets and desserts Cookies. Cakes. Pies. Candy. Seasonings and condiments Mayonnaise. Pre-made sauces and marinades. The items listed above may not be all the foods and drinks you should limit. Talk to a dietitian to learn more. Where to find more information American Heart Association (AHA): heart.org This information is not intended to replace advice given to you by your health care provider. Make sure you discuss any questions you have with your health care provider. Document Revised: 09/04/2022 Document Reviewed: 09/04/2022 Elsevier Patient Education  2024 [employer].

## 2023-10-24 NOTE — Assessment & Plan Note (Signed)
 stable mood with wellbutrin  Denies any adverse effects F/up in 2months

## 2023-10-24 NOTE — Progress Notes (Signed)
 Established Patient Visit  Patient: Carmen Wall   DOB: Mar 14, 1991   33 y.o. Female  MRN: [id number] Visit Date: 10/24/2023  Subjective:     Chief Complaint  Patient presents with   Follow-up    2 month follow up for weight management and hyperlipidemia    HPI Morbid obesity (HCC) Diet: high protein, unknown amount Exercise: stays active at [facility], unknown number of daily steps No exercise regimen outside of work Lost 10lbs in last 3months Total weight loss: 32lbs in 9months Wt Readings from Last 3 Encounters:  10/24/23 285 lb 6.4 oz (129.5 kg)  07/21/23 295 lb (133.8 kg)  06/14/23 297 lb 12.8 oz (135.1 kg)    Maintain wegovy  dose Advised to track daily protein intake with myfitnesspal app, and incorporate daily exercise (cardio and weight training) Repeat BMP and LDL F/up in 2months  Primary hypertension BP at goal without med BP Readings from Last 3 Encounters:  10/24/23 124/78  07/21/23 120/70  06/14/23 133/76    F/up in 2months  Depression, recurrent (HCC) stable mood with wellbutrin  Denies any adverse effects F/up in 2months  Reviewed medical, surgical, and social history today  Medications: Outpatient Medications Prior to Visit  Medication Sig   albuterol  (VENTOLIN  HFA) 108 (90 Base) MCG/ACT inhaler Inhale 1 puff into the lungs every 6 (six) hours as needed for wheezing or shortness of breath.   buPROPion  (WELLBUTRIN  XL) 150 MG 24 hr tablet Take 1 tablet (150 mg total) by mouth daily.   cholecalciferol (VITAMIN D3) 25 MCG (1000 UNIT) tablet Take 5,000 Units by mouth daily.   ibuprofen  (ADVIL ) 600 MG tablet Take 1 tablet (600 mg total) by mouth every 6 (six) hours.   magnesium 30 MG tablet Take 30 mg by mouth 2 (two) times daily.   Prenatal Vit-Fe Fumarate-FA (PREPLUS) 27-1 MG TABS Take 1 tablet by mouth daily.   [DISCONTINUED] Semaglutide -Weight Management (WEGOVY ) 2.4 MG/0.75ML SOAJ Inject 2.4 mg into the skin once a  week.   No facility-administered medications prior to visit.   Reviewed past medical and social history.   ROS per HPI above      Objective:  BP 124/78 (BP Location: Left Arm, Patient Position: Sitting, Cuff Size: Large)   Pulse 78   Temp (!) 97.3 F (36.3 C) (Temporal)   Ht 5\' 7"  (1.702 m)   Wt 285 lb 6.4 oz (129.5 kg)   LMP 10/19/2023   SpO2 98%   BMI 44.70 kg/m      Physical Exam Vitals and nursing note reviewed.  Constitutional:      Appearance: She is obese.  Cardiovascular:     Rate and Rhythm: Normal rate.     Pulses: Normal pulses.  Pulmonary:     Effort: Pulmonary effort is normal.  Musculoskeletal:     Right lower leg: No edema.     Left lower leg: No edema.  Neurological:     Mental Status: She is alert and oriented to person, place, and time.  Psychiatric:        Mood and Affect: Mood normal.        Behavior: Behavior normal.        Thought Content: Thought content normal.     No results found for any visits on 10/24/23.    Assessment & Plan:    Problem List Items Addressed This Visit     Depression, recurrent (HCC)   stable  mood with wellbutrin  Denies any adverse effects F/up in 2months      Insulin  resistance   Relevant Medications   Semaglutide -Weight Management (WEGOVY ) 2.4 MG/0.75ML SOAJ   Morbid obesity (HCC) - Primary   Diet: high protein, unknown amount Exercise: stays active at work-walking, unknown number of daily steps No exercise regimen outside of work Lost 10lbs in last 3months Total weight loss: 32lbs in 9months Wt Readings from Last 3 Encounters:  10/24/23 285 lb 6.4 oz (129.5 kg)  07/21/23 295 lb (133.8 kg)  06/14/23 297 lb 12.8 oz (135.1 kg)    Maintain wegovy  dose Advised to track daily protein intake with myfitnesspal app, and incorporate daily exercise (cardio and weight training) Repeat BMP and LDL F/up in 2months      Relevant Medications   Semaglutide -Weight Management (WEGOVY ) 2.4 MG/0.75ML SOAJ   Other  Relevant Orders   Renal Function Panel   Primary hypertension   BP at goal without med BP Readings from Last 3 Encounters:  10/24/23 124/78  07/21/23 120/70  06/14/23 133/76    F/up in 2months      Relevant Medications   Semaglutide -Weight Management (WEGOVY ) 2.4 MG/0.75ML SOAJ   Other Relevant Orders   Renal Function Panel   Other Visit Diagnoses       Mixed hyperlipidemia       Relevant Medications   Semaglutide -Weight Management (WEGOVY ) 2.4 MG/0.75ML SOAJ   Other Relevant Orders   Direct LDL      Return in about 2 months (around 12/24/2023) for Weight management.     Kathrene Parents, NP

## 2023-10-24 NOTE — Assessment & Plan Note (Signed)
 BP at goal without med BP Readings from Last 3 Encounters:  10/24/23 124/78  07/21/23 120/70  06/14/23 133/76    F/up in 2months

## 2023-10-24 NOTE — Assessment & Plan Note (Addendum)
 Diet: high protein, unknown amount Exercise: stays active at [facility], unknown number of daily steps No exercise regimen outside of work Lost 10lbs in last 3months Total weight loss: 32lbs in 9months Wt Readings from Last 3 Encounters:  10/24/23 285 lb 6.4 oz (129.5 kg)  07/21/23 295 lb (133.8 kg)  06/14/23 297 lb 12.8 oz (135.1 kg)    Maintain wegovy  dose Advised to track daily protein intake with myfitnesspal app, and incorporate daily exercise (cardio and weight training) Repeat BMP and LDL F/up in 2months

## 2023-10-25 ENCOUNTER — Ambulatory Visit: Payer: Self-pay | Admitting: Nurse Practitioner

## 2023-11-03 ENCOUNTER — Ambulatory Visit: Payer: Self-pay

## 2023-11-03 NOTE — Telephone Encounter (Signed)
 Noted. Patient advised by nurse triage to go to the ED.

## 2023-11-03 NOTE — Telephone Encounter (Signed)
 Copied from CRM [id number]. Topic: Clinical - Red Word Triage >> Nov 03, 2023  4:16 PM Leah C wrote: Red Word that prompted transfer to Nurse Triage: Elevated blood pressure last reading 158/99; diarrhea; chest pain   Chief Complaint: Hypertension Symptoms: chest pain, diarrhea, and headache Frequency: intermittent Pertinent Negatives: Patient denies neurological symptoms Disposition: [x] ED /[] Urgent Care (no appt availability in office) / [] Appointment(In office/virtual)/ []  [facility]/ [] Home Care/ [] Refused Recommended Disposition /[] [facility]/ []  Follow-up with PCP Additional Notes: Patient reports elevated BP this afternoon. 158/99 originally and then 124/82 while on phone. Pt also endorsing chest tightness between her breast with pain radiating to her left shoulder.  Pt says that the pain last a few minutes but is a sharp pressure and feels uncomfortable. Pt also reports diarrhea, says that she feels weak and doesn't have an appetite. RN advising ED given that there are no appts avail at office within next 24 hours. Pt is agreeable and says she will have her husband take her.   Reason for Disposition  Systolic BP between 120-129 with Diastolic < 80  [1] Chest pain lasts < 5 minutes AND [2] NO chest pain or cardiac symptoms (e.g., breathing difficulty, sweating) now  (Exception: Chest pains that last only a few seconds.)  Answer Assessment - Initial Assessment Questions 1. BLOOD PRESSURE: "What is the blood pressure?" "Did you take at least two measurements 5 minutes apart?"     158/99; 124/82  2. ONSET: "When did you take your blood pressure?"     First taken a couple hours ago; 2nd was taken while on phone  3. HOW: "How did you take your blood pressure?" (e.g., automatic home BP monitor, visiting nurse)     Home BP monitor  4. HISTORY: "Do you have a history of high blood pressure?"     Had HTN towards end and after given birth  5. MEDICINES: "Are you taking  any medicines for blood pressure?" "Have you missed any doses recently?"     Not currently on medication  6. OTHER SYMPTOMS: "Do you have any symptoms?" (e.g., blurred vision, chest pain, difficulty breathing, headache, weakness)     Chest pain, diarrhea, head "feels off"  7. PREGNANCY: "Is there any chance you are pregnant?" "When was your last menstrual period?"     No, has IUD  Answer Assessment - Initial Assessment Questions 1. LOCATION: "Where does it hurt?"       Middle in chest  2. RADIATION: "Does the pain go anywhere else?" (e.g., into neck, jaw, arms, back)     Radiates to L shoulder, intermittent and sharp  3. ONSET: "When did the chest pain begin?" (Minutes, hours or days)      Started today  4. PATTERN: "Does the pain come and go, or has it been constant since it started?"  "Does it get worse with exertion?"      Comes and goes  5. DURATION: "How long does it last" (e.g., seconds, minutes, hours)     Last a couple of minutes  6. SEVERITY: "How bad is the pain?"  (e.g., Scale 1-10; mild, moderate, or severe)    - MILD (1-3): doesn't interfere with normal activities     - MODERATE (4-7): interferes with normal activities or awakens from sleep    - SEVERE (8-10): excruciating pain, unable to do any normal activities       "Barely a 3" but moreso uncomfortable pressure  7. CARDIAC RISK FACTORS: "Do  you have any history of heart problems or risk factors for heart disease?" (e.g., angina, prior heart attack; diabetes, high blood pressure, high cholesterol, smoker, or strong family history of heart disease)     No  8. PULMONARY RISK FACTORS: "Do you have any history of lung disease?"  (e.g., blood clots in lung, asthma, emphysema, birth control pills)     No  9. CAUSE: "What do you think is causing the chest pain?"     Unsure of cause, felt like it could be related to high BP reading at home  10. OTHER SYMPTOMS: "Do you have any other symptoms?" (e.g., dizziness, nausea,  vomiting, sweating, fever, difficulty breathing, cough)       Diarrhea and nausea  11. PREGNANCY: "Is there any chance you are pregnant?" "When was your last menstrual period?"       No, has IUD  Protocols used: Blood Pressure - High-A-AH, Chest Pain-A-AH

## 2023-11-17 ENCOUNTER — Other Ambulatory Visit: Payer: Self-pay

## 2023-11-30 ENCOUNTER — Encounter: Payer: Self-pay | Admitting: Nurse Practitioner

## 2023-12-28 ENCOUNTER — Other Ambulatory Visit (HOSPITAL_COMMUNITY): Payer: Self-pay

## 2023-12-28 ENCOUNTER — Encounter: Payer: Self-pay | Admitting: Nurse Practitioner

## 2023-12-28 ENCOUNTER — Ambulatory Visit: Admitting: Nurse Practitioner

## 2023-12-28 DIAGNOSIS — E88819 Insulin resistance, unspecified: Secondary | ICD-10-CM

## 2023-12-28 DIAGNOSIS — I10 Essential (primary) hypertension: Secondary | ICD-10-CM

## 2023-12-28 DIAGNOSIS — F3341 Major depressive disorder, recurrent, in partial remission: Secondary | ICD-10-CM

## 2023-12-28 DIAGNOSIS — E782 Mixed hyperlipidemia: Secondary | ICD-10-CM

## 2023-12-28 DIAGNOSIS — F339 Major depressive disorder, recurrent, unspecified: Secondary | ICD-10-CM

## 2023-12-28 MED ORDER — WEGOVY 2.4 MG/0.75ML ~~LOC~~ SOAJ
2.4000 mg | SUBCUTANEOUS | 1 refills | Status: DC
  Filled 2023-12-28 – 2024-01-18 (×2): qty 3, 28d supply, fill #0

## 2023-12-28 MED ORDER — BUPROPION HCL ER (XL) 300 MG PO TB24
300.0000 mg | ORAL_TABLET | Freq: Every day | ORAL | 1 refills | Status: DC
  Filled 2023-12-28 – 2024-01-18 (×2): qty 90, 90d supply, fill #0
  Filled 2024-02-07 – 2024-04-20 (×2): qty 90, 90d supply, fill #1

## 2023-12-28 NOTE — Progress Notes (Signed)
 Established Patient Visit  Patient: Carmen Wall   DOB: [birth year]   33 y.o. Female  MRN: [id number] Visit Date: 12/28/2023  Subjective:    Chief Complaint  Patient presents with   Follow-up    2 month follow up for weight management questions about the shot    HPI Morbid obesity (HCC) Diet: 2-18meals a day, continues to struggle with high calorie snacks, admits to emotional eating. Exercise: stays active at [facility], unknown number of daily steps No exercise regimen outside of work Lost 12lbs in last 2months Total weight loss: 44lbs in 11months Wt Readings from Last 3 Encounters:  12/28/23 277 lb 3.2 oz (125.7 kg)  10/24/23 285 lb 6.4 oz (129.5 kg)  07/21/23 295 lb (133.8 kg)    States she is unable to afford appointment with nutritionist and therapist. Maintain wegovy  dose Advised to track daily protein intake with myfitnesspal app, and incorporate daily exercise (cardio and weight training). Provided suggestions on healthy snacks options F/up in 2months  Depression, recurrent (HCC) Increased wellbutrin  dose to 300mg  to help manage emotional eating. F/up in 2months  Reviewed medical, surgical, and social history today  Medications: Outpatient Medications Prior to Visit  Medication Sig   albuterol  (VENTOLIN  HFA) 108 (90 Base) MCG/ACT inhaler Inhale 1 puff into the lungs every 6 (six) hours as needed for wheezing or shortness of breath.   cholecalciferol (VITAMIN D3) 25 MCG (1000 UNIT) tablet Take 5,000 Units by mouth daily.   drospirenone-ethinyl estradiol (YAZ) 3-0.02 MG tablet Take 1 tablet by mouth daily.   ibuprofen  (ADVIL ) 600 MG tablet Take 1 tablet (600 mg total) by mouth every 6 (six) hours.   Prenatal Vit-Fe Fumarate-FA (PREPLUS) 27-1 MG TABS Take 1 tablet by mouth daily.   [DISCONTINUED] buPROPion  (WELLBUTRIN  XL) 150 MG 24 hr tablet Take 1 tablet (150 mg total) by mouth daily.   [DISCONTINUED] Semaglutide -Weight Management  (WEGOVY ) 2.4 MG/0.75ML SOAJ Inject 2.4 mg into the skin once a week.   [DISCONTINUED] magnesium 30 MG tablet Take 30 mg by mouth 2 (two) times daily. (Patient not taking: Reported on 12/28/2023)   No facility-administered medications prior to visit.   Reviewed past medical and social history.   ROS per HPI above      Objective:  BP 126/78 (BP Location: Left Arm, Patient Position: Sitting, Cuff Size: Large)   Pulse 88   Temp 98.5 F (36.9 C) (Oral)   Ht 5' 7 (1.702 m)   Wt 277 lb 3.2 oz (125.7 kg)   LMP 12/04/2023   SpO2 98%   BMI 43.42 kg/m      Physical Exam Vitals and nursing note reviewed.  Constitutional:      Appearance: She is obese.  Cardiovascular:     Rate and Rhythm: Normal rate.     Pulses: Normal pulses.  Pulmonary:     Effort: Pulmonary effort is normal.  Neurological:     Mental Status: She is alert and oriented to person, place, and time.  Psychiatric:        Attention and Perception: Attention normal.        Mood and Affect: Mood is anxious. Affect is tearful.        Speech: Speech normal.        Behavior: Behavior is cooperative.        Thought Content: Thought content normal.        Cognition and Memory: Cognition and  memory normal.     No results found for any visits on 12/28/23.    Assessment & Plan:    Problem List Items Addressed This Visit     Depression, recurrent (HCC)   Increased wellbutrin  dose to 300mg  to help manage emotional eating. F/up in 2months      Relevant Medications   buPROPion  (WELLBUTRIN  XL) 300 MG 24 hr tablet   Insulin  resistance   Relevant Medications   Semaglutide -Weight Management (WEGOVY ) 2.4 MG/0.75ML SOAJ   Morbid obesity (HCC) - Primary   Diet: 2-35meals a day, continues to struggle with high calorie snacks, admits to emotional eating. Exercise: stays active at [facility], unknown number of daily steps No exercise regimen outside of work Lost 12lbs in last 2months Total weight loss: 44lbs in  11months Wt Readings from Last 3 Encounters:  12/28/23 277 lb 3.2 oz (125.7 kg)  10/24/23 285 lb 6.4 oz (129.5 kg)  07/21/23 295 lb (133.8 kg)    States she is unable to afford appointment with nutritionist and therapist. Maintain wegovy  dose Advised to track daily protein intake with myfitnesspal app, and incorporate daily exercise (cardio and weight training). Provided suggestions on healthy snacks options F/up in 2months      Relevant Medications   Semaglutide -Weight Management (WEGOVY ) 2.4 MG/0.75ML SOAJ   Primary hypertension   Relevant Medications   Semaglutide -Weight Management (WEGOVY ) 2.4 MG/0.75ML SOAJ   Other Visit Diagnoses       Mixed hyperlipidemia       Relevant Medications   Semaglutide -Weight Management (WEGOVY ) 2.4 MG/0.75ML SOAJ     Recurrent major depressive disorder, in partial remission (HCC)       Relevant Medications   buPROPion  (WELLBUTRIN  XL) 300 MG 24 hr tablet      Return in about 2 months (around 02/28/2024) for Weight management, hyperlipidemia (fasting).     Roselie Mood, NP

## 2023-12-28 NOTE — Assessment & Plan Note (Signed)
 Increased wellbutrin  dose to 300mg  to help manage emotional eating. F/up in 2months

## 2023-12-28 NOTE — Assessment & Plan Note (Addendum)
 Diet: 2-10meals a day, continues to struggle with high calorie snacks, admits to emotional eating. Exercise: stays active at [facility], unknown number of daily steps No exercise regimen outside of work Lost 12lbs in last 2months Total weight loss: 44lbs in 11months Wt Readings from Last 3 Encounters:  12/28/23 277 lb 3.2 oz (125.7 kg)  10/24/23 285 lb 6.4 oz (129.5 kg)  07/21/23 295 lb (133.8 kg)    States she is unable to afford appointment with nutritionist and therapist. Maintain wegovy  dose Advised to track daily protein intake with myfitnesspal app, and incorporate daily exercise (cardio and weight training). Provided suggestions on healthy snacks options F/up in 2months

## 2024-01-08 ENCOUNTER — Other Ambulatory Visit (HOSPITAL_COMMUNITY): Payer: Self-pay

## 2024-01-18 ENCOUNTER — Other Ambulatory Visit (HOSPITAL_COMMUNITY): Payer: Self-pay

## 2024-01-23 ENCOUNTER — Other Ambulatory Visit (HOSPITAL_COMMUNITY): Payer: Self-pay

## 2024-02-06 ENCOUNTER — Other Ambulatory Visit (HOSPITAL_COMMUNITY): Payer: Self-pay

## 2024-02-07 ENCOUNTER — Other Ambulatory Visit (HOSPITAL_COMMUNITY): Payer: Self-pay

## 2024-02-12 ENCOUNTER — Other Ambulatory Visit (HOSPITAL_COMMUNITY): Payer: Self-pay

## 2024-03-07 ENCOUNTER — Telehealth: Payer: Self-pay | Admitting: Nurse Practitioner

## 2024-03-07 ENCOUNTER — Other Ambulatory Visit (HOSPITAL_COMMUNITY): Payer: Self-pay

## 2024-03-07 ENCOUNTER — Ambulatory Visit: Payer: Self-pay | Admitting: *Deleted

## 2024-03-07 NOTE — Telephone Encounter (Signed)
 No available appt today. Recommended mobile bus or UC.  See NT encounter regarding medication side effects. Patient requesting call back. Please advise.      FYI Only or Action Required?: Action required by provider: request for appointment and update on patient condition.  Patient was last seen in primary care on 12/28/2023 by [name], [name], NP.  Called Nurse Triage reporting Dizziness.  Symptoms began several weeks ago.  Interventions attempted: Rest, hydration, or home remedies.  Symptoms are: gradually worsening.  Triage Disposition: See HCP Within 4 Hours (Or PCP Triage)  Patient/caregiver understands and will follow disposition?: No, wishes to speak with PCP            Reason for Disposition  [1] Dizziness caused by heat exposure, sudden standing, or poor fluid intake AND [2] no improvement after 2 hours of rest and fluids  Answer Assessment - Initial Assessment Questions Recommended UC no available appt today . Patient unsure if wegovy  medication causing sx. Reports 1st couple of days after taking are the worst. Random bouts of feeling like passing out on and off x 2 weeks. Took last shot Sunday night. Poor po intake due to nausea. Tolerating gatorade but that's about it.  Recommended mobile unit / UC today . Patient unsure to go . Can not make appt for tomorrow due to work and reports this is why she made appt on line for Monday with PCP. Please advise. If patient can be seen today . Patient requesting call back.   Recommended if sx worsen go to ED or call 911 if passing out .  CAL notified    1. DESCRIPTION: Describe your dizziness.     Lightheaded and faint at random times with nausea  2. LIGHTHEADED: Do you feel lightheaded? (e.g., somewhat faint, woozy, weak upon standing)     Weak upon standing, woozy  3. VERTIGO: Do you feel like either you or the room is spinning or tilting? (i.e., vertigo)     na 4. SEVERITY: How bad is it?  Do you feel  like you are going to faint? Can you stand and walk?     moderate 5. ONSET:  When did the dizziness begin?     2 weeks ago on and off 6. AGGRAVATING FACTORS: Does anything make it worse? (e.g., standing, change in head position)     Standing or just happens at random times 7. HEART RATE: Can you tell me your heart rate? How many beats in 15 seconds?  (Note: Not all patients can do this.)       Na  8. CAUSE: What do you think is causing the dizziness? (e.g., decreased fluids or food, diarrhea, emotional distress, heat exposure, new medicine, sudden standing, vomiting; unknown)     Not sure  but not eating and drinking well due to nausea  9. RECURRENT SYMPTOM: Have you had dizziness before? If Yes, ask: When was the last time? What happened that time?     Yes have had before .  10. OTHER SYMPTOMS: Do you have any other symptoms? (e.g., fever, chest pain, vomiting, diarrhea, bleeding)       Nausea, feelings of clamminess at grocery store yesterday but not today . Poor po intake but tolerating gatorade. Reports weird feeling comes over me . Random times. Feeling of passing out. Took wegovy  shot and 1st few days if the worst side effects. Waking up feels like gas pains in chest. No difficulty breathing no chest pain now.  11. PREGNANCY: Is  there any chance you are pregnant? When was your last menstrual period?       na  Protocols used: Dizziness - Lightheadedness-A-AH

## 2024-03-07 NOTE — Telephone Encounter (Signed)
 FYI: This call has been transferred to triage nurse: the Triage Nurse. Once the result note has been entered staff can address the message at that time.  Patient called in with the following symptoms:  Red Word:pt scheduled appt with Roselie for 03/11/24 for Constant nausea, keep feeling like im going to pass out, little to no appetite, extreme fatigue. I called NT & transferred her over.    Please advise at [facility] [phone]  Message is routed to Provider Pool.

## 2024-03-11 ENCOUNTER — Ambulatory Visit: Admitting: Nurse Practitioner

## 2024-03-11 ENCOUNTER — Encounter: Payer: Self-pay | Admitting: Nurse Practitioner

## 2024-03-11 VITALS — BP 128/72 | HR 76 | Ht 67.0 in | Wt 274.2 lb

## 2024-03-11 DIAGNOSIS — F332 Major depressive disorder, recurrent severe without psychotic features: Secondary | ICD-10-CM

## 2024-03-11 DIAGNOSIS — R110 Nausea: Secondary | ICD-10-CM | POA: Diagnosis not present

## 2024-03-11 DIAGNOSIS — R8290 Unspecified abnormal findings in urine: Secondary | ICD-10-CM

## 2024-03-11 DIAGNOSIS — E559 Vitamin D deficiency, unspecified: Secondary | ICD-10-CM

## 2024-03-11 DIAGNOSIS — R42 Dizziness and giddiness: Secondary | ICD-10-CM | POA: Diagnosis not present

## 2024-03-11 LAB — CBC
HCT: 38.6 % (ref 36.0–46.0)
Hemoglobin: 12.9 g/dL (ref 12.0–15.0)
MCHC: 33.5 g/dL (ref 30.0–36.0)
MCV: 88.9 fl (ref 78.0–100.0)
Platelets: 219 K/uL (ref 150.0–400.0)
RBC: 4.34 Mil/uL (ref 3.87–5.11)
RDW: 12.7 % (ref 11.5–15.5)
WBC: 5.6 K/uL (ref 4.0–10.5)

## 2024-03-11 LAB — POCT URINALYSIS DIPSTICK
Bilirubin, UA: NEGATIVE
Glucose, UA: NEGATIVE
Ketones, UA: NEGATIVE
Nitrite, UA: NEGATIVE
Protein, UA: POSITIVE — AB
Spec Grav, UA: >=1.03 — AB (ref 1.010–1.025)
Urobilinogen, UA: NEGATIVE U/dL — AB
pH, UA: 6 (ref 5.0–8.0)

## 2024-03-11 LAB — POCT URINE PREGNANCY: Preg Test, Ur: NEGATIVE

## 2024-03-11 NOTE — Assessment & Plan Note (Signed)
Advised to take 2000IU daily.

## 2024-03-11 NOTE — Patient Instructions (Addendum)
 Go to lab Continue to hold wegovy  Start vit. D3 2000IU daily, flonase, and zyrtec 1tab daily Schedule appointment with psychiatry

## 2024-03-11 NOTE — Assessment & Plan Note (Addendum)
 Lost 3lbs in last 3months Total weight loss with wegovy : 47Lbs in last 14months Reports dizziness, nausea and labile mood with wegovy  dose-worse on 1st 3days after med administration. She stopped med 1week ago, and symptoms have improved. Denied need for zofran  IUD removal and start COC 2months ago Wt Readings from Last 3 Encounters:  03/11/24 274 lb 3.2 oz (124.4 kg)  12/28/23 277 lb 3.2 oz (125.7 kg)  10/24/23 285 lb 6.4 oz (129.5 kg)    Advised about need for condoms if she resume GLP-1RA injection due to DRUG-DRUG INTERACTION with COC. She verbalized understanding. Negative orthostatic vital signs today. Continue to hold wegovy . Advised to maintain adequate oral hydration Check UA, CBC, urine preg, CMP, and lipase. Abnormal urinalysis, urine culture sent Negative urine preg

## 2024-03-11 NOTE — Assessment & Plan Note (Addendum)
 Reports increased frustration over labile mood. Minimal improvement with wellbutrin  300mg  Declined referral to psychology-unable to afford cost Agreed to psychiatry referral

## 2024-03-11 NOTE — Progress Notes (Signed)
 Established Patient Visit  Patient: Carmen Wall   DOB: 1991-01-04   33 y.o. Female  MRN: [id number] Visit Date: 03/11/2024  Subjective:    Chief Complaint  Patient presents with   Nausea    Nausea and feeling like I am going to pass out last 2 weeks of Wegovy  injections has been feeling like this and get an anxiety feeling when taking it  Intermittent ear popping and foggy sounds for 2-3 months    HPI Depression Reports increased frustration over labile mood. Minimal improvement with wellbutrin  300mg  Declined referral to psychology-unable to afford cost Agreed to psychiatry referral  Morbid obesity (HCC) Lost 3lbs in last 3months Total weight loss with wegovy : 47Lbs in last 14months Reports dizziness, nausea and labile mood with wegovy  dose-worse on 1st 3days after med administration. She stopped med 1week ago, and symptoms have improved. Denied need for zofran  IUD removal and start COC 2months ago Wt Readings from Last 3 Encounters:  03/11/24 274 lb 3.2 oz (124.4 kg)  12/28/23 277 lb 3.2 oz (125.7 kg)  10/24/23 285 lb 6.4 oz (129.5 kg)    Advised about need for condoms if she resume GLP-1RA injection due to DRUG-DRUG INTERACTION with COC. She verbalized understanding. Negative orthostatic vital signs today. Continue to hold wegovy . Advised to maintain adequate oral hydration Check UA, CBC, urine preg, CMP, and lipase. Abnormal urinalysis, urine culture sent Negative urine preg  Vitamin D  deficiency Advised to take 2000IU daily   BP Readings from Last 3 Encounters:  03/11/24 128/72  12/28/23 126/78  10/24/23 124/78    Wt Readings from Last 3 Encounters:  03/11/24 274 lb 3.2 oz (124.4 kg)  12/28/23 277 lb 3.2 oz (125.7 kg)  10/24/23 285 lb 6.4 oz (129.5 kg)    Orthostatic VS for the past 24 hrs (Last 3 readings):  BP- Lying Pulse- Lying BP- Sitting Pulse- Sitting BP- Standing at 0 minutes Pulse- Standing at 0 minutes  03/11/24 1124  124/73 81 124/74 86 119/76 99    Reviewed medical, surgical, and social history today  Medications: Outpatient Medications Prior to Visit  Medication Sig   albuterol  (VENTOLIN  HFA) 108 (90 Base) MCG/ACT inhaler Inhale 1 puff into the lungs every 6 (six) hours as needed for wheezing or shortness of breath.   buPROPion  (WELLBUTRIN  XL) 300 MG 24 hr tablet Take 1 tablet (300 mg total) by mouth daily.   drospirenone-ethinyl estradiol (YAZ) 3-0.02 MG tablet Take 1 tablet by mouth daily.   [DISCONTINUED] ibuprofen  (ADVIL ) 600 MG tablet Take 1 tablet (600 mg total) by mouth every 6 (six) hours. (Patient taking differently: Take 600 mg by mouth every 6 (six) hours as needed for headache, mild pain (pain score 1-3) or moderate pain (pain score 4-6).)   [DISCONTINUED] cholecalciferol (VITAMIN D3) 25 MCG (1000 UNIT) tablet Take 5,000 Units by mouth daily. (Patient not taking: Reported on 03/11/2024)   [DISCONTINUED] Prenatal Vit-Fe Fumarate-FA (PREPLUS) 27-1 MG TABS Take 1 tablet by mouth daily. (Patient not taking: Reported on 03/11/2024)   [DISCONTINUED] semaglutide -weight management (WEGOVY ) 2.4 MG/0.75ML SOAJ SQ injection Inject 2.4 mg into the skin once a week. (Patient not taking: Reported on 03/11/2024)   No facility-administered medications prior to visit.   Reviewed past medical and social history.   ROS per HPI above      Objective:  BP 128/72 (BP Location: Right Arm, Patient Position: Sitting, Cuff Size: Large)   Pulse  76   Ht 5' 7 (1.702 m)   Wt 274 lb 3.2 oz (124.4 kg)   LMP 02/26/2024 (Exact Date)   SpO2 99%   Breastfeeding No   BMI 42.95 kg/m      Physical Exam Vitals and nursing note reviewed.  HENT:     Right Ear: Hearing, ear canal and external ear normal. A middle ear effusion is present. Tympanic membrane is not injected, scarred, perforated, erythematous, retracted or bulging.     Left Ear: Hearing, ear canal and external ear normal. A middle ear effusion is present.  Tympanic membrane is not injected, scarred, perforated, erythematous, retracted or bulging.  Cardiovascular:     Rate and Rhythm: Normal rate and regular rhythm.     Pulses: Normal pulses.     Heart sounds: Normal heart sounds.  Pulmonary:     Effort: Pulmonary effort is normal.     Breath sounds: Normal breath sounds.  Abdominal:     General: There is no distension.     Palpations: Abdomen is soft.     Tenderness: There is no abdominal tenderness. There is no guarding.  Musculoskeletal:     Right lower leg: No edema.     Left lower leg: No edema.  Skin:    Findings: No rash.  Neurological:     Mental Status: She is alert and oriented to person, place, and time.  Psychiatric:        Attention and Perception: Attention normal.        Mood and Affect: Mood is depressed. Affect is tearful.        Speech: Speech normal.        Behavior: Behavior is cooperative.        Thought Content: Thought content normal.        Cognition and Memory: Cognition normal.        Judgment: Judgment normal.     Results for orders placed or performed in visit on 03/11/24  POCT urinalysis dipstick  Result Value Ref Range   Color, UA Yellow    Clarity, UA Little Cloudy    Glucose, UA Negative Negative   Bilirubin, UA Negative    Ketones, UA Negative    Spec Grav, UA >=1.030 (A) 1.010 - 1.025   Blood, UA +-    pH, UA 6.0 5.0 - 8.0   Protein, UA Positive (A) Negative   Urobilinogen, UA negative (A) 0.2 or 1.0 E.U./dL   Nitrite, UA Negative    Leukocytes, UA Large (3+) (A) Negative   Appearance     Odor    POCT urine pregnancy  Result Value Ref Range   Preg Test, Ur Negative Negative      Assessment & Plan:    Problem List Items Addressed This Visit     Depression   Reports increased frustration over labile mood. Minimal improvement with wellbutrin  300mg  Declined referral to psychology-unable to afford cost Agreed to psychiatry referral      Relevant Orders   Ambulatory referral to  Psychiatry   Morbid obesity (HCC)   Lost 3lbs in last 3months Total weight loss with wegovy : 47Lbs in last 14months Reports dizziness, nausea and labile mood with wegovy  dose-worse on 1st 3days after med administration. She stopped med 1week ago, and symptoms have improved. Denied need for zofran  IUD removal and start COC 2months ago Wt Readings from Last 3 Encounters:  03/11/24 274 lb 3.2 oz (124.4 kg)  12/28/23 277 lb 3.2 oz (125.7 kg)  10/24/23  285 lb 6.4 oz (129.5 kg)    Advised about need for condoms if she resume GLP-1RA injection due to DRUG-DRUG INTERACTION with COC. She verbalized understanding. Negative orthostatic vital signs today. Continue to hold wegovy . Advised to maintain adequate oral hydration Check UA, CBC, urine preg, CMP, and lipase. Abnormal urinalysis, urine culture sent Negative urine preg      Vitamin D  deficiency   Advised to take 2000IU daily      Other Visit Diagnoses       Nausea    -  Primary   Relevant Orders   Comprehensive metabolic panel with GFR   CBC   Lipase   POCT urinalysis dipstick (Completed)   POCT urine pregnancy (Completed)     Dizziness       Relevant Orders   Comprehensive metabolic panel with GFR   CBC     Abnormal finding on urinalysis       Relevant Orders   Urine Culture      Return if symptoms worsen or fail to improve.     Roselie Mood, NP

## 2024-03-12 ENCOUNTER — Ambulatory Visit: Payer: Self-pay | Admitting: Nurse Practitioner

## 2024-03-12 LAB — COMPREHENSIVE METABOLIC PANEL WITH GFR
ALT: 19 U/L (ref 0–35)
AST: 17 U/L (ref 0–37)
Albumin: 4.2 g/dL (ref 3.5–5.2)
Alkaline Phosphatase: 59 U/L (ref 39–117)
BUN: 11 mg/dL (ref 6–23)
CO2: 23 meq/L (ref 19–32)
Calcium: 9.5 mg/dL (ref 8.4–10.5)
Chloride: 107 meq/L (ref 96–112)
Creatinine, Ser: 0.8 mg/dL (ref 0.40–1.20)
GFR: 95.66 mL/min (ref >60.00)
Glucose, Bld: 80 mg/dL (ref 70–99)
Potassium: 3.7 meq/L (ref 3.5–5.1)
Sodium: 141 meq/L (ref 135–145)
Total Bilirubin: 0.4 mg/dL (ref 0.2–1.2)
Total Protein: 7.2 g/dL (ref 6.0–8.3)

## 2024-03-12 LAB — URINE CULTURE
MICRO NUMBER:: 17061076
SPECIMEN QUALITY:: ADEQUATE

## 2024-03-12 LAB — LIPASE: Lipase: 13 U/L (ref 11.0–59.0)

## 2024-05-21 ENCOUNTER — Other Ambulatory Visit: Payer: Self-pay

## 2024-05-21 ENCOUNTER — Ambulatory Visit (HOSPITAL_COMMUNITY): Payer: Self-pay | Admitting: Family

## 2024-05-21 DIAGNOSIS — F3341 Major depressive disorder, recurrent, in partial remission: Secondary | ICD-10-CM

## 2024-05-21 MED ORDER — HYDROXYZINE HCL 10 MG PO TABS: 10.0000 mg | ORAL_TABLET | Freq: Three times a day (TID) | ORAL | 0 refills | Status: AC | PRN

## 2024-05-21 MED ORDER — BUPROPION HCL ER (XL) 150 MG PO TB24: 300.0000 mg | ORAL_TABLET | Freq: Every day | ORAL | 0 refills | Status: DC
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
## 2024-06-04 ENCOUNTER — Ambulatory Visit (HOSPITAL_COMMUNITY): Payer: Self-pay | Admitting: Family

## 2024-06-16 ENCOUNTER — Encounter: Payer: Self-pay | Admitting: Nurse Practitioner

## 2024-06-21 ENCOUNTER — Ambulatory Visit: Admitting: Nurse Practitioner

## 2024-06-21 ENCOUNTER — Telehealth (HOSPITAL_COMMUNITY): Payer: Self-pay

## 2024-06-21 ENCOUNTER — Other Ambulatory Visit (HOSPITAL_COMMUNITY): Payer: Self-pay

## 2024-06-21 ENCOUNTER — Encounter: Payer: Self-pay | Admitting: Nurse Practitioner

## 2024-06-21 VITALS — BP 130/78 | HR 82 | Temp 98.3°F | Ht 67.0 in | Wt 296.6 lb

## 2024-06-21 DIAGNOSIS — I10 Essential (primary) hypertension: Secondary | ICD-10-CM

## 2024-06-21 DIAGNOSIS — R202 Paresthesia of skin: Secondary | ICD-10-CM | POA: Diagnosis not present

## 2024-06-21 DIAGNOSIS — M79601 Pain in right arm: Secondary | ICD-10-CM

## 2024-06-21 DIAGNOSIS — Z8759 Personal history of other complications of pregnancy, childbirth and the puerperium: Secondary | ICD-10-CM

## 2024-06-21 DIAGNOSIS — Z6841 Body Mass Index (BMI) 40.0 and over, adult: Secondary | ICD-10-CM

## 2024-06-21 DIAGNOSIS — M79602 Pain in left arm: Secondary | ICD-10-CM

## 2024-06-21 DIAGNOSIS — E559 Vitamin D deficiency, unspecified: Secondary | ICD-10-CM

## 2024-06-21 DIAGNOSIS — F3341 Major depressive disorder, recurrent, in partial remission: Secondary | ICD-10-CM | POA: Diagnosis not present

## 2024-06-21 DIAGNOSIS — E782 Mixed hyperlipidemia: Secondary | ICD-10-CM

## 2024-06-21 LAB — HEMOGLOBIN A1C: Hgb A1c MFr Bld: 5.1 % (ref 4.6–6.5)

## 2024-06-21 LAB — LIPID PANEL
Cholesterol: 210 mg/dL — ABNORMAL HIGH (ref 28–200)
HDL: 70.2 mg/dL
LDL Cholesterol: 110 mg/dL — ABNORMAL HIGH (ref 10–99)
NonHDL: 139.78
Total CHOL/HDL Ratio: 3
Triglycerides: 147 mg/dL (ref 10.0–149.0)
VLDL: 29.4 mg/dL (ref 0.0–40.0)

## 2024-06-21 LAB — VITAMIN D 25 HYDROXY (VIT D DEFICIENCY, FRACTURES): VITD: 20.27 ng/mL — ABNORMAL LOW (ref 30.00–100.00)

## 2024-06-21 LAB — IBC + FERRITIN
Ferritin: 33 ng/mL (ref 10.0–291.0)
Iron: 100 ug/dL (ref 42–145)
Saturation Ratios: 21.3 % (ref 20.0–50.0)
TIBC: 470.4 ug/dL — ABNORMAL HIGH (ref 250.0–450.0)
Transferrin: 336 mg/dL (ref 212.0–360.0)

## 2024-06-21 LAB — CBC
HCT: 38.1 % (ref 36.0–46.0)
Hemoglobin: 13 g/dL (ref 12.0–15.0)
MCHC: 34.2 g/dL (ref 30.0–36.0)
MCV: 87.4 fl (ref 78.0–100.0)
Platelets: 218 K/uL (ref 150.0–400.0)
RBC: 4.36 Mil/uL (ref 3.87–5.11)
RDW: 13 % (ref 11.5–15.5)
WBC: 5.9 K/uL (ref 4.0–10.5)

## 2024-06-21 LAB — B12 AND FOLATE PANEL
Folate: 16.8 ng/mL
Vitamin B-12: 173 pg/mL — ABNORMAL LOW (ref 211–911)

## 2024-06-21 MED ORDER — WEGOVY 1.5 MG PO TABS
1.5000 mg | ORAL_TABLET | Freq: Every day | ORAL | 0 refills | Status: DC
  Filled 2024-06-21: qty 30, 30d supply, fill #0

## 2024-06-21 NOTE — Telephone Encounter (Signed)
 Where is this request coming from? Carmen Wall  Medication: Wegovy  1.5 mg tablets Prior authorization required? Yes If YES, on primary or secondary insurance? Primary Comments: product not on formulary

## 2024-06-21 NOTE — Progress Notes (Unsigned)
 "               Established Patient Visit  Patient: Carmen Wall   DOB: 1990-06-16   34 y.o. Female  MRN: [id number] Visit Date: 06/22/2024  Subjective:    Chief Complaint  Patient presents with   Numbness    Numbness and tingling in arms worse for past 3 months sharp pain from elbow down  Discuss Wegovy  pill    HPI Primary hypertension BP at goal without med Hx of gestational HTN BP Readings from Last 3 Encounters:  06/21/24 130/78  05/21/24 (!) 165/103  03/11/24 128/72    Continue to monitor Encouraged to maintain a heart healthy diet and daily exercise  Vitamin D  deficiency Repeat vit. D: chronically low Sent high dose vit. D 50,000IU weekly x 12weeks, then switch to OVER THE COUNTER dose 5000IU daily continuously.   Paresthesia and pain of both upper extremities Onset 3months ago. Numbness in all fingers and intermittent tingling in arms and hands. Waxing and waning, not affected by movement or certain positions. Not associated to muscle weakness or muscle atrophy. Hx of carpal tunnel during pregnancy.  Possible cervical radiculopathy vs deficiency? Check B12, folate, iron,cbc, hgbA1c, tsh, and bmp.  Depression Has f/up appointment with psychiatry in 2weeks, had genetic test for med efficacy, weaning of Wellbutrin  x 62month, current use of vistaril .  Morbid obesity (HCC) Wegovy  discontinued in past due to fear of self administering injection and side effects. She wants to try wegovy  tablet despite similar side effects. Wt Readings from Last 3 Encounters:  06/21/24 296 lb 9.6 oz (134.5 kg)  05/21/24 290 lb (131.5 kg)  03/11/24 274 lb 3.2 oz (124.4 kg)    Advised about the importance of healthy diet and daily exercise. Provided printed information. Wegovy  tablet sent F/up in 62month      06/21/2024   10:03 AM 05/21/2024    1:11 PM 10/24/2023   10:47 AM  Depression screen PHQ 2/9  Decreased Interest 0 0 0  Down, Depressed, Hopeless 1 1 0  PHQ -  2 Score 1 1 0  Altered sleeping 1  0  Tired, decreased energy 2  2  Change in appetite 3  0  Feeling bad or failure about yourself  1  0  Trouble concentrating 0  0  Moving slowly or fidgety/restless 1  1  Suicidal thoughts 0  0  PHQ-9 Score 9  3   Difficult doing work/chores Somewhat difficult  Somewhat difficult     Data saved with a previous flowsheet row definition       06/21/2024   10:03 AM 10/24/2023   10:47 AM 07/21/2023    9:18 AM 04/11/2023    8:20 AM  GAD 7 : Generalized Anxiety Score  Nervous, Anxious, on Edge 1 1 0 1  Control/stop worrying 1 1 0 0  Worry too much - different things 1 1 1 2   Trouble relaxing 1 0 0 1  Restless 1 1 0 0  Easily annoyed or irritable 2 2 1  0  Afraid - awful might happen 0 0 0 0  Total GAD 7 Score 7 6 2 4   Anxiety Difficulty Somewhat difficult Somewhat difficult Not difficult at all Not difficult at all   Reviewed medical, surgical, and social history today  Medications: Show/hide medication list[1] Reviewed past medical and social history.   ROS per HPI above      Objective:  BP 130/78 (BP Location: Left Arm, Patient Position: Sitting, Cuff  Size: Large)   Pulse 82   Temp 98.3 F (36.8 C) (Oral)   Ht 5' 7 (1.702 m)   Wt 296 lb 9.6 oz (134.5 kg)   LMP 05/26/2024   SpO2 98%   BMI 46.45 kg/m      Physical Exam Vitals and nursing note reviewed.  Constitutional:      Appearance: She is obese.  Cardiovascular:     Rate and Rhythm: Normal rate and regular rhythm.     Pulses: Normal pulses.     Heart sounds: Normal heart sounds.  Pulmonary:     Effort: Pulmonary effort is normal.     Breath sounds: Normal breath sounds.  Musculoskeletal:     Right shoulder: Normal.     Left shoulder: Normal.     Right upper arm: Normal.     Left upper arm: Normal.     Right elbow: Normal.     Left elbow: Normal.     Right forearm: Normal.     Left forearm: Normal.     Right wrist: Normal.     Left wrist: Normal.     Right hand:  Normal.     Left hand: Normal.     Cervical back: Normal.     Thoracic back: Normal.  Neurological:     Mental Status: She is alert and oriented to person, place, and time.     Results for orders placed or performed in visit on 06/21/24  VITAMIN D  25 Hydroxy (Vit-D Deficiency, Fractures)  Result Value Ref Range   VITD 20.27 (L) 30.00 - 100.00 ng/mL  IBC + Ferritin  Result Value Ref Range   Iron 100 42 - 145 ug/dL   Transferrin 663.9 787.9 - 360.0 mg/dL   Saturation Ratios 78.6 20.0 - 50.0 %   Ferritin 33.0 10.0 - 291.0 ng/mL   TIBC 470.4 (H) 250.0 - 450.0 mcg/dL  CBC  Result Value Ref Range   WBC 5.9 4.0 - 10.5 K/uL   RBC 4.36 3.87 - 5.11 Mil/uL   Platelets 218.0 150.0 - 400.0 K/uL   Hemoglobin 13.0 12.0 - 15.0 g/dL   HCT 61.8 63.9 - 53.9 %   MCV 87.4 78.0 - 100.0 fl   MCHC 34.2 30.0 - 36.0 g/dL   RDW 86.9 88.4 - 84.4 %  Hemoglobin A1c  Result Value Ref Range   Hgb A1c MFr Bld 5.1 4.6 - 6.5 %  B12 and Folate Panel  Result Value Ref Range   Vitamin B-12 173 (L) 211 - 911 pg/mL   Folate 16.8 >5.9 ng/mL  C-reactive protein  Result Value Ref Range   CRP 7 0 - 10 mg/L  Sedimentation rate  Result Value Ref Range   Sed Rate 15 0 - 32 mm/hr  ANA w/Reflex  Result Value Ref Range   ANA Titer 1 WILL FOLLOW   Lipid panel  Result Value Ref Range   Cholesterol 210 (H) 28 - 200 mg/dL   Triglycerides 852.9 89.9 - 149.0 mg/dL   HDL 29.79 >60.99 mg/dL   VLDL 70.5 0.0 - 59.9 mg/dL   LDL Cholesterol 889 (H) 10 - 99 mg/dL   Total CHOL/HDL Ratio 3    NonHDL 139.78       Assessment & Plan:    Problem List Items Addressed This Visit     Depression   Has f/up appointment with psychiatry in 2weeks, had genetic test for med efficacy, weaning of Wellbutrin  x 25month, current use of vistaril .  Relevant Medications   semaglutide -weight management (WEGOVY ) 1.5 MG tablet   History of gestational hypertension   Relevant Medications   semaglutide -weight management (WEGOVY ) 1.5 MG  tablet   Morbid obesity (HCC)   Wegovy  discontinued in past due to fear of self administering injection and side effects. She wants to try wegovy  tablet despite similar side effects. Wt Readings from Last 3 Encounters:  06/21/24 296 lb 9.6 oz (134.5 kg)  05/21/24 290 lb (131.5 kg)  03/11/24 274 lb 3.2 oz (124.4 kg)    Advised about the importance of healthy diet and daily exercise. Provided printed information. Wegovy  tablet sent F/up in 34month       Relevant Medications   semaglutide -weight management (WEGOVY ) 1.5 MG tablet   Other Relevant Orders   Lipid panel (Completed)   Paresthesia and pain of both upper extremities - Primary   Onset 3months ago. Numbness in all fingers and intermittent tingling in arms and hands. Waxing and waning, not affected by movement or certain positions. Not associated to muscle weakness or muscle atrophy. Hx of carpal tunnel during pregnancy.  Possible cervical radiculopathy vs deficiency? Check B12, folate, iron,cbc, hgbA1c, tsh, and bmp.      Relevant Orders   IBC + Ferritin (Completed)   CBC (Completed)   Hemoglobin A1c (Completed)   B12 and Folate Panel (Completed)   C-reactive protein (Completed)   Sedimentation rate (Completed)   ANA w/Reflex (Completed)   TSH   Basic metabolic panel with GFR   Primary hypertension   BP at goal without med Hx of gestational HTN BP Readings from Last 3 Encounters:  06/21/24 130/78  05/21/24 (!) 165/103  03/11/24 128/72    Continue to monitor Encouraged to maintain a heart healthy diet and daily exercise      Relevant Medications   semaglutide -weight management (WEGOVY ) 1.5 MG tablet   Other Relevant Orders   Basic metabolic panel with GFR   Vitamin D  deficiency   Repeat vit. D: chronically low Sent high dose vit. D 50,000IU weekly x 12weeks, then switch to OVER THE COUNTER dose 5000IU daily continuously.       Relevant Orders   VITAMIN D  25 Hydroxy (Vit-D Deficiency, Fractures)  (Completed)   Other Visit Diagnoses       Mixed hyperlipidemia       Relevant Medications   semaglutide -weight management (WEGOVY ) 1.5 MG tablet   Other Relevant Orders   Lipid panel (Completed)      Return in about 4 weeks (around 07/19/2024) for Weight management.     Roselie Mood, NP      [1]  Outpatient Medications Prior to Visit  Medication Sig   albuterol  (VENTOLIN  HFA) 108 (90 Base) MCG/ACT inhaler Inhale 1 puff into the lungs every 6 (six) hours as needed for wheezing or shortness of breath.   buPROPion  (WELLBUTRIN  XL) 150 MG 24 hr tablet Take 2 tablets (300 mg total) by mouth daily. (Patient taking differently: Take 150 mg by mouth daily.)   drospirenone-ethinyl estradiol (YAZ) 3-0.02 MG tablet Take 1 tablet by mouth daily.   hydrOXYzine  (ATARAX ) 10 MG tablet Take 1 tablet (10 mg total) by mouth 3 (three) times daily as needed.   No facility-administered medications prior to visit.   "

## 2024-06-21 NOTE — Patient Instructions (Signed)
 Go to lab  How to Increase Your Level of Physical Activity Getting regular physical activity is important for your overall health and well-being. Most people do not get enough exercise. There are easy ways to increase your level of physical activity, even if you have not been very active in the past or if you are just starting out. What are the benefits of physical activity? Physical activity has many short-term and long-term benefits. Being active on a regular basis can improve your physical and mental health as well as provide other benefits. Physical health benefits Helping you lose weight or maintain a healthy weight. Strengthening your muscles and bones. Reducing your risk of certain long-term (chronic) diseases, including heart disease, cancer, and diabetes. Being able to move around more easily and for longer periods of time without getting tired (increased endurance or stamina). Improving your ability to fight off illness (enhanced immunity). Being able to sleep better. Helping you stay healthy as you get older, including: Helping you stay mobile, or capable of walking and moving around. Preventing accidents, such as falls. Increasing life expectancy. Mental health benefits Boosting your mood and improving your self-esteem. Lowering your chance of having mental health problems, such as depression or anxiety. Helping you feel good about your body. Other benefits Finding new sources of fun and enjoyment. Meeting new people who share a common interest. Before you begin If you have a chronic illness or have not been active for a while, check with your health care provider about how to get started. Ask your health care provider what activities are safe for you. Start out slowly. Walking or doing some simple chair exercises is a good place to start, especially if you have not been active before or for a long time. Set goals that you can work toward. Ask your health care provider how much  exercise is best for you. In general, most adults should: Do moderate-intensity exercise for at least 150 minutes each week (30 minutes on most days of the week) or vigorous exercise for at least 75 minutes each week, or a combination of these. Moderate-intensity exercise can include walking at a quick pace, biking, yoga, water aerobics, or gardening. Vigorous exercise involves activities that take more effort, such as jogging or running, playing sports, swimming laps, or jumping rope. Do strength exercises on at least 2 days each week. This can include weight lifting, body weight exercises, and resistance-band exercises. How to be more physically active Make a plan  Try to find activities that you enjoy. You are more likely to commit to an exercise routine if it does not feel like a chore. If you have bone or joint problems, choose low-impact exercises, like walking or swimming. Use these tips for being successful with an exercise plan: Find a workout partner for accountability. Join a group or class, such as an aerobics class, cycling class, or sports team. Make family time active. Go for a walk, bike, or swim. Include a variety of exercises each week. Consider using a fitness tracker, such as a mobile phone app or a device worn like a watch, that will count the number of steps you take each day. Many people strive to reach 10,000 steps a day. Find ways to be active in your daily routines Besides your formal exercise plans, you can find ways to do physical activity during your daily routines, such as: Walking or biking to work or to the store. Taking the stairs instead of the elevator. Parking farther away from  the door at work or at the store. Planning walking meetings. Walking around while you are on the phone. Where to find more information Centers for Disease Control and Prevention: campuscasting.com.pt President's Council on Fitness, Sports & Nutrition:  www.fitness.gov ChooseMyPlate: http://www.harvey.com/ Contact a health care provider if: You have headaches, muscle aches, or joint pain that is concerning. You feel dizzy or light-headed while exercising. You faint. You feel your heart skipping, racing, or fluttering. You have chest pain while exercising. Summary Exercise benefits your mind and body at any age, even if you are just starting out. If you have a chronic illness or have not been active for a while, check with your health care provider before increasing your physical activity. Choose activities that are safe and enjoyable for you. Ask your health care provider what activities are safe for you. Start slowly. Tell your health care provider if you have problems as you start to increase your activity level. This information is not intended to replace advice given to you by your health care provider. Make sure you discuss any questions you have with your health care provider. Document Revised: 09/18/2020 Document Reviewed: 09/18/2020 Elsevier Patient Education  2024 [employer].

## 2024-06-22 ENCOUNTER — Encounter: Payer: Self-pay | Admitting: Nurse Practitioner

## 2024-06-22 ENCOUNTER — Ambulatory Visit: Payer: Self-pay | Admitting: Nurse Practitioner

## 2024-06-22 DIAGNOSIS — M79601 Pain in right arm: Secondary | ICD-10-CM | POA: Insufficient documentation

## 2024-06-22 DIAGNOSIS — E538 Deficiency of other specified B group vitamins: Secondary | ICD-10-CM

## 2024-06-22 MED ORDER — VITAMIN D (ERGOCALCIFEROL) 1.25 MG (50000 UNIT) PO CAPS: 50000.0000 [IU] | ORAL_CAPSULE | ORAL | 0 refills | Status: AC

## 2024-06-22 NOTE — Assessment & Plan Note (Signed)
 Wegovy  discontinued in past due to fear of self administering injection and side effects. She wants to try wegovy  tablet despite similar side effects. Wt Readings from Last 3 Encounters:  06/21/24 296 lb 9.6 oz (134.5 kg)  05/21/24 290 lb (131.5 kg)  03/11/24 274 lb 3.2 oz (124.4 kg)    Advised about the importance of healthy diet and daily exercise. Provided printed information. Wegovy  tablet sent F/up in 26month

## 2024-06-22 NOTE — Assessment & Plan Note (Signed)
 Has f/up appointment with psychiatry in 2weeks, had genetic test for med efficacy, weaning of Wellbutrin  x 41month, current use of vistaril .

## 2024-06-22 NOTE — Assessment & Plan Note (Signed)
 Repeat vit. D: chronically low Sent high dose vit. D 50,000IU weekly x 12weeks, then switch to OVER THE COUNTER dose 5000IU daily continuously.

## 2024-06-22 NOTE — Assessment & Plan Note (Signed)
 Onset 3months ago. Numbness in all fingers and intermittent tingling in arms and hands. Waxing and waning, not affected by movement or certain positions. Not associated to muscle weakness or muscle atrophy. Hx of carpal tunnel during pregnancy.  Possible cervical radiculopathy vs deficiency? Check B12, folate, iron,cbc, hgbA1c, tsh, and bmp.

## 2024-06-22 NOTE — Assessment & Plan Note (Addendum)
 BP at goal without med Hx of gestational HTN BP Readings from Last 3 Encounters:  06/21/24 130/78  05/21/24 (!) 165/103  03/11/24 128/72    Continue to monitor Encouraged to maintain a heart healthy diet and daily exercise

## 2024-06-24 ENCOUNTER — Ambulatory Visit (HOSPITAL_COMMUNITY): Admitting: Family

## 2024-06-24 LAB — C-REACTIVE PROTEIN: CRP: 7 mg/L (ref 0–10)

## 2024-06-24 LAB — SEDIMENTATION RATE: Sed Rate: 15 mm/h (ref 0–32)

## 2024-06-24 LAB — ANA W/REFLEX

## 2024-06-25 ENCOUNTER — Other Ambulatory Visit (HOSPITAL_COMMUNITY): Payer: Self-pay

## 2024-06-25 ENCOUNTER — Telehealth (HOSPITAL_COMMUNITY): Payer: Self-pay

## 2024-06-25 NOTE — Telephone Encounter (Signed)
 Pharmacy Patient Advocate Encounter   Received notification from Pt Calls Messages that prior authorization for Wegovy  1.5 mg tabalets is required/requested.   Insurance verification completed.   The patient is insured through [employer].   Per test claim: Per test claim, medication is not covered due to plan/benefit exclusion, PA not submitted at this time  *This is a brand new drug that is not on insurance formulary as of yet

## 2024-06-25 NOTE — Telephone Encounter (Signed)
 PA request has been Received. New Encounter has been or will be created for follow up. For additional info see Pharmacy Prior Auth telephone encounter from 06/25/24.

## 2024-06-26 NOTE — Progress Notes (Signed)
 Last read by Tedi CHRISTELLA Deneise Zelda at 8:40PM on 06/24/2024.

## 2024-06-27 ENCOUNTER — Encounter: Payer: Self-pay | Admitting: Nurse Practitioner

## 2024-06-27 ENCOUNTER — Ambulatory Visit

## 2024-06-27 DIAGNOSIS — I10 Essential (primary) hypertension: Secondary | ICD-10-CM

## 2024-06-27 DIAGNOSIS — F3341 Major depressive disorder, recurrent, in partial remission: Secondary | ICD-10-CM

## 2024-06-27 DIAGNOSIS — E538 Deficiency of other specified B group vitamins: Secondary | ICD-10-CM | POA: Diagnosis not present

## 2024-06-27 DIAGNOSIS — E782 Mixed hyperlipidemia: Secondary | ICD-10-CM

## 2024-06-27 DIAGNOSIS — E559 Vitamin D deficiency, unspecified: Secondary | ICD-10-CM

## 2024-06-27 MED ORDER — CYANOCOBALAMIN 1000 MCG/ML IJ SOLN
1000.0000 ug | Freq: Once | INTRAMUSCULAR | Status: AC
  Administered 2024-06-27: 1000 ug via INTRAMUSCULAR

## 2024-06-27 NOTE — Telephone Encounter (Signed)
 Called patient and informed her that per PA team that the Wegovy  medication is not covered due to plan/benefit exclusion. I also informed her that Roselie would like for her to call her insurance to ask if wegovy  injection or zepbound injection is covered? She thanked me for calling and stated that she will give her insurance a call and get back with me with the requested information.

## 2024-06-27 NOTE — Progress Notes (Signed)
 Per orders of [facility], injection of B12 given by Karna Christ, cma.  Patient tolerated injection well.  She will return in 1 week for 2nd out of 4 weekly then monthy. Dm/cma

## 2024-07-01 MED ORDER — ZEPBOUND 2.5 MG/0.5ML ~~LOC~~ SOAJ: 2.5000 mg | SUBCUTANEOUS | 0 refills | Status: DC

## 2024-07-02 ENCOUNTER — Ambulatory Visit (HOSPITAL_COMMUNITY): Admitting: Family

## 2024-07-02 ENCOUNTER — Other Ambulatory Visit (HOSPITAL_COMMUNITY): Payer: Self-pay

## 2024-07-02 ENCOUNTER — Telehealth (HOSPITAL_COMMUNITY): Payer: Self-pay

## 2024-07-02 VITALS — BP 126/78 | HR 80

## 2024-07-02 DIAGNOSIS — F3341 Major depressive disorder, recurrent, in partial remission: Secondary | ICD-10-CM | POA: Diagnosis not present

## 2024-07-02 DIAGNOSIS — F0630 Mood disorder due to known physiological condition, unspecified: Secondary | ICD-10-CM | POA: Diagnosis not present

## 2024-07-02 MED ORDER — DESVENLAFAXINE SUCCINATE ER 50 MG PO TB24
50.0000 mg | ORAL_TABLET | Freq: Every day | ORAL | 0 refills | Status: AC
  Filled 2024-07-02: qty 60, 60d supply, fill #0

## 2024-07-02 MED ORDER — ZEPBOUND 2.5 MG/0.5ML ~~LOC~~ SOAJ
2.5000 mg | SUBCUTANEOUS | 0 refills | Status: AC
  Filled 2024-07-02 – 2024-07-12 (×4): qty 2, 28d supply, fill #0

## 2024-07-02 MED ORDER — LAMOTRIGINE 25 MG PO TABS
25.0000 mg | ORAL_TABLET | Freq: Every day | ORAL | 0 refills | Status: AC
  Filled 2024-07-02: qty 60, 60d supply, fill #0

## 2024-07-02 NOTE — Addendum Note (Signed)
 Addended by: Damarrion Mimbs A on: 07/02/2024 08:26 AM   Modules accepted: Orders

## 2024-07-02 NOTE — Progress Notes (Unsigned)
 BH MD/PA/NP OP Progress Note  07/03/2024 10:20 AM Carmen Wall  MRN:  [id number]  Chief Complaint: Medication management  Carmen Wall 34 year old female who presents for medication management follow-up appointment.  Per initial assessment patient was taking Wellbutrin  300 mg since tapered to Wellbutrin  150 mg.  States that she has been off of Wellbutrin  for the past 2 weeks as she ran out of medication denied any withdrawal symptoms related to being off her medications.     [name] continues to have concerns related to mood fluctuations ups and downs reported irritability and anxiety symptoms. Stated that she has used hydroxyzine  however medication can be too sedating, reported having lingering sedating hangover with hydroxyzine  10 mg at night, however has taken medication during the day with minimal effects.  reviewed GeneSight test results.  Discussed initiating Pristiq  25 mg daily.  And Lamictal  25 mg daily for mood stabilization.  No other documented concerns noted at this visit.  States she continues to utilize hydroxyzine  for sleep disturbance. Patient reported concerns related to [facility] billing stated that she has to pay 500.00 dollars for her results. CMA will follow-up with patient for additional information related to EOB. Reviewed test results.may use as directed Wellbutrin  and des venlafaxine as directed. Patient to start mood stabilization medication.    HPI: H/O major depressive disorder, generalized anxiety disorder, unspecified mood disorder   Visit Diagnosis:    ICD-10-CM   1. Recurrent major depressive disorder, in partial remission  F33.41     2. Mood disorder in conditions classified elsewhere  F06.30       Past Psychiatric History:   Past Medical History:  Past Medical History:  Diagnosis Date   Acute pain of right knee 07/23/2018   Acute right ankle pain 10/24/2018   ADHD    Allergy    Backache 09/09/2020   Bilateral  swelling of feet    Chronic fatigue syndrome    Chronic right-sided low back pain with right-sided sciatica 05/15/2018   Chronic tension-type headache, not intractable 07/04/2022   Cough variant asthma 09/26/2019   Depression 09/05/2017   Excessive daytime sleepiness 03/13/2019   GAD (generalized anxiety disorder) 08/24/2018   IBS (irritable bowel syndrome)    Insomnia 07/04/2022   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   Lactose intolerance    Migraine without aura    Neck pain 09/09/2020   Obesity affecting pregnancy, antepartum 07/04/2022   Recommendations  [ x] Aspirin  81 mg daily after 12 weeks; discontinue after 36 weeks  [ ]  Nutrition consult  [ x] Weight gain 11-20 lbs for singleton and 25-35 lbs for twin pregnancy (IOM guidelines)      Higher class of obesity patients recommended to gain closer to lower limit     Weight loss is associated with adverse outcomes  [ x] Screen for DM with A1C or early 2 hr GTT  [ ]  Referral to Card   Other hyperlipidemia 12/12/2019   Piriformis syndrome of right side 11/11/2019   Radial styloid tenosynovitis 09/09/2020   Restless legs syndrome (RLS) 07/04/2022   Rhinitis, allergic 09/09/2014   Vacuum-assisted vaginal delivery 12/29/2022   Vitamin D  deficiency 11/25/2019    Past Surgical History:  Procedure Laterality Date   INTRAUTERINE DEVICE (IUD) INSERTION  04/2017   Mirena     KNEE ARTHROSCOPY Right    WISDOM TOOTH EXTRACTION      Family Psychiatric History:   Family History:  Family History  Problem Relation Age of Onset   Diabetes  Father    Arthritis Father    Depression Father    Mental illness Father        PTSD, anxiety   Hypertension Father    Hyperlipidemia Father    Anxiety disorder Father    Bipolar disorder Father    Sleep apnea Father    Alcoholism Father    Obesity Father    Arthritis Mother        osteoartthritis   Fibroids Mother    Ovarian cysts Mother    Obesity Mother    Kidney disease Maternal  Grandmother    Arthritis Maternal Grandmother    Prostate cancer Maternal Grandfather    Arthritis Maternal Grandfather    Arthritis Paternal Grandmother    Diabetes Paternal Grandfather    Mental illness Paternal Grandfather    Arthritis Paternal Grandfather     Social History:  Social History   Socioeconomic History   Marital status: Married    Spouse name: Not on file   Number of children: Not on file   Years of education: Not on file   Highest education level: Some college, no degree  Occupational History   Not on file  Tobacco Use   Smoking status: Never   Smokeless tobacco: Never  Vaping Use   Vaping status: Never Used  Substance and Sexual Activity   Alcohol use: No    Alcohol/week: 0.0 standard drinks of alcohol   Drug use: No   Sexual activity: Yes    Partners: Male    Birth control/protection: Pill    Comment: ic 3 weeks ago  Other Topics Concern   Not on file  Social History Narrative   Not on file   Social Drivers of Health   Tobacco Use: Low Risk (06/22/2024)   Patient History    Smoking Tobacco Use: Never    Smokeless Tobacco Use: Never    Passive Exposure: Not on file  Financial Resource Strain: Low Risk (12/28/2023)   Overall Financial Resource Strain (CARDIA)    Difficulty of Paying Living Expenses: Not very hard  Food Insecurity: Food Insecurity Present (12/28/2023)   Epic    Worried About Programme Researcher, Broadcasting/film/video in the Last Year: Sometimes true    Ran Out of Food in the Last Year: Sometimes true  Transportation Needs: No Transportation Needs (12/28/2023)   Epic    Lack of Transportation (Medical): No    Lack of Transportation (Non-Medical): No  Physical Activity: Insufficiently Active (12/28/2023)   Exercise Vital Sign    Days of Exercise per Week: 2 days    Minutes of Exercise per Session: 30 min  Stress: Stress Concern Present (12/28/2023)   [employer] of Occupational Health - Occupational Stress Questionnaire    Feeling of Stress: To  some extent  Social Connections: Moderately Integrated (12/28/2023)   Social Connection and Isolation Panel    Frequency of Communication with Friends and Family: Three times a week    Frequency of Social Gatherings with Friends and Family: Patient declined    Attends Religious Services: Patient declined    Active Member of Clubs or Organizations: Yes    Attends Banker Meetings: More than 4 times per year    Marital Status: Married  Depression (PHQ2-9): Medium Risk (06/21/2024)   Depression (PHQ2-9)    PHQ-2 Score: 9  Alcohol Screen: Not on file  Housing: Unknown (12/28/2023)   Epic    Unable to Pay for Housing in the Last Year: Patient declined  Number of Times Moved in the Last Year: Not on file    Homeless in the Last Year: No  Utilities: Not At Risk (01/04/2023)   AHC Utilities    Threatened with loss of utilities: No  Health Literacy: Not on file    Allergies: Allergies[1]  Metabolic Disorder Labs: Lab Results  Component Value Date   HGBA1C 5.1 06/21/2024   No results found for: PROLACTIN Lab Results  Component Value Date   CHOL 210 (H) 06/21/2024   TRIG 147.0 06/21/2024   HDL 70.20 06/21/2024   CHOLHDL 3 06/21/2024   VLDL 29.4 06/21/2024   LDLCALC 110 (H) 06/21/2024   LDLCALC 130 (H) 06/14/2023   Lab Results  Component Value Date   TSH 0.69 06/14/2023   TSH 1.21 11/09/2021    Therapeutic Level Labs: No results found for: LITHIUM No results found for: VALPROATE No results found for: CBMZ  Current Medications: Current Outpatient Medications  Medication Sig Dispense Refill   albuterol  (VENTOLIN  HFA) 108 (90 Base) MCG/ACT inhaler Inhale 1 puff into the lungs every 6 (six) hours as needed for wheezing or shortness of breath. 8 g 0   cyanocobalamin  (VITAMIN B12) 1000 MCG/ML injection weekly B12 injection x 46month, then monthly injection.     desvenlafaxine  (PRISTIQ ) 50 MG 24 hr tablet Take 1 tablet (50 mg total) by mouth daily. 60 tablet 0    drospirenone-ethinyl estradiol (YAZ) 3-0.02 MG tablet Take 1 tablet by mouth daily.     hydrOXYzine  (ATARAX ) 10 MG tablet Take 1 tablet (10 mg total) by mouth 3 (three) times daily as needed. 45 tablet 0   lamoTRIgine  (LAMICTAL ) 25 MG tablet Take 1 tablet (25 mg total) by mouth daily. 60 tablet 0   Vitamin D , Ergocalciferol , (DRISDOL ) 1.25 MG (50000 UNIT) CAPS capsule Take 1 capsule (50,000 Units total) by mouth every 7 (seven) days. 12 capsule 0   ZEPBOUND  2.5 MG/0.5ML Pen Inject 2.5 mg into the skin once a week. (Patient not taking: Reported on 07/02/2024) 2 mL 0   No current facility-administered medications for this visit.     Musculoskeletal: Strength & Muscle Tone: within normal limits Gait & Station: normal Patient leans: N/A  Psychiatric Specialty Exam: Review of Systems  Blood pressure 126/78, pulse 80, last menstrual period 05/26/2024, not currently breastfeeding.There is no height or weight on file to calculate BMI.  General Appearance: Casual  Eye Contact:  Good  Speech:  Clear and Coherent  Volume:  Normal  Mood:  Anxious and Depressed  Affect:  Congruent  Thought Process:  Coherent  Orientation:  Full (Time, Place, and Person)  Thought Content: Logical   Suicidal Thoughts:  No  Homicidal Thoughts:  No  Memory:  Immediate;   Good Recent;   Good  Judgement:  Good  Insight:  Good  Psychomotor Activity:  Normal  Concentration:  Concentration: Good  Recall:  Good  Fund of Knowledge: Good  Language: Good  Akathisia:  No  Handed:  Right  AIMS (if indicated): not done  Assets:  Communication Skills Desire for Improvement  ADL's:  Intact  Cognition: WNL  Sleep:  Good   Screenings: GAD-7    Flowsheet Row Office Visit from 06/21/2024 in [facility] [employer] Visit from 10/24/2023 in [facility] [place] [facility] [employer] [facility] Visit from 07/21/2023 in [facility] [place] [facility] [employer] Visit  from 04/11/2023 in [facility] [employer] Visit from 01/11/2023 in [place]  [facility] at [employer]  Total GAD-7 Score 7 6 2 4 7    PHQ2-9    Flowsheet Row Office Visit from 06/21/2024 in [facility] Visit from 05/21/2024 in [facility] Office Visit from 10/24/2023 in [facility] Visit from 07/21/2023 in [facility] [employer] Visit from 06/14/2023 in [facility] [place] [facility] [employer]  PHQ-2 Total Score 1 1 0 0 0  PHQ-9 Total Score 9 -- 3 5 --   Flowsheet Row Admission (Discharged) from 01/04/2023 in [place] [facility]S [facility]ty [facility] Admission (Discharged) from 12/28/2022 in [place] 5S Mother Baby Unit Admission (Discharged) from 11/17/2022 in [facility] 1S Maternity Assessment Unit  C-SSRS RISK CATEGORY No Risk No Risk No Risk     Assessment and Plan:  Carmen Wall is a 34 year old female who presents for medication management follow-up appointment.  Carries a diagnosis related to major depressive disorder, generalized anxiety disorder.  Recently discontinued Wellbutrin  150 mg.  Reported she has not been on Wellbutrin  for the past week.  Discussed initiating Pristiq  and Lamictal  for mood stabilization.  Education provided.  Stevens-Johnson syndrome, nausea vomiting and diarrhea.  Monitor symptoms.  Discontinue Lamictal  if noting signs of rash, fever headaches or dizziness.  She was amendable to plan.  Patient to follow-up 2 months for medication adherence/tolerability.  Collaboration of Care: Collaboration of Care: Medication Management AEB patient to start Pristiq  and Lamictal  for mood stabilization  Patient/Guardian was advised Release of Information must be obtained prior to any record release in order to collaborate their care with an outside provider.  Patient/Guardian was advised if they have not already done so to contact the registration department to sign all necessary forms in order for us  to release information regarding their care.   Consent: Patient/Guardian gives verbal consent for treatment and assignment of benefits for services provided during this visit. Patient/Guardian expressed understanding and agreed to proceed.    Staci LOISE Kerns, NP 07/03/2024, 10:20 AM     [1] No Known Allergies

## 2024-07-03 ENCOUNTER — Other Ambulatory Visit: Payer: Self-pay

## 2024-07-03 ENCOUNTER — Other Ambulatory Visit (HOSPITAL_COMMUNITY): Payer: Self-pay

## 2024-07-03 ENCOUNTER — Telehealth (HOSPITAL_COMMUNITY): Payer: Self-pay

## 2024-07-03 NOTE — Telephone Encounter (Signed)
 Pharmacy Patient Advocate Encounter   Received notification from Pt Calls Messages that prior authorization for Zepbound  2.5 mg/0.5 ml auto injectors is required/requested.   Insurance verification completed.   The patient is insured through [employer].   Per test claim: Patient needs documentation of OSA and a sleep study with an AHI of 15 or higher. No PA will be submitted at this time.

## 2024-07-03 NOTE — Telephone Encounter (Signed)
 PA request has been Received. New Encounter has been or will be created for follow up. For additional info see Pharmacy Prior Auth telephone encounter from 07/03/24.

## 2024-07-05 ENCOUNTER — Ambulatory Visit

## 2024-07-05 DIAGNOSIS — E538 Deficiency of other specified B group vitamins: Secondary | ICD-10-CM | POA: Diagnosis not present

## 2024-07-05 MED ORDER — CYANOCOBALAMIN 1000 MCG/ML IJ SOLN
1000.0000 ug | Freq: Once | INTRAMUSCULAR | Status: AC
  Administered 2024-07-05: 1000 ug via INTRAMUSCULAR

## 2024-07-05 NOTE — Progress Notes (Signed)
 Pt here for 2 of 4 weekly B12 injections per [facility]. Provider order on 06/22/24 Result Note: Schedule nurse visit for weekly B12 injection x 24month, then monthly injection.   B12 1000mcg given IM in the left deltoid and pt tolerated injection well.  Next B12 injection scheduled for 07/11/24.

## 2024-07-08 ENCOUNTER — Other Ambulatory Visit (HOSPITAL_COMMUNITY): Payer: Self-pay

## 2024-07-09 ENCOUNTER — Other Ambulatory Visit (HOSPITAL_COMMUNITY): Payer: Self-pay

## 2024-07-10 ENCOUNTER — Other Ambulatory Visit (HOSPITAL_COMMUNITY): Payer: Self-pay

## 2024-07-10 NOTE — Telephone Encounter (Addendum)
 Please review and advise:  Called patient's pharmacy [facility] and spoke with [place]. She informed me that patient will need a Dx of OSA for the medication to be approved for dispense

## 2024-07-11 ENCOUNTER — Other Ambulatory Visit (HOSPITAL_COMMUNITY): Payer: Self-pay

## 2024-07-11 ENCOUNTER — Ambulatory Visit

## 2024-07-11 ENCOUNTER — Telehealth: Payer: Self-pay

## 2024-07-11 DIAGNOSIS — E538 Deficiency of other specified B group vitamins: Secondary | ICD-10-CM

## 2024-07-11 MED ORDER — CYANOCOBALAMIN 1000 MCG/ML IJ SOLN
1000.0000 ug | Freq: Once | INTRAMUSCULAR | Status: AC
  Administered 2024-07-11: 1000 ug via INTRAMUSCULAR

## 2024-07-11 NOTE — Telephone Encounter (Signed)
 Called patient and informed of the denial for the Zepbound  and asked if she would be interested in a referral to weight management clinic. Patient stated that she has gone there before and did not like that she had to pay $60 every visit. She asked if there was something else that can be done?

## 2024-07-11 NOTE — Telephone Encounter (Signed)
 Pharmacy Patient Advocate Encounter   Received notification from CoverMyMeds that prior authorization for Zepbound  2.5mg /0.28ml is required/requested.   Insurance verification completed.   The patient is insured through [employer].   Per test claim: PA required; PA submitted to above mentioned insurance via Latent Key/confirmation #/[id number] Status is pending

## 2024-07-11 NOTE — Progress Notes (Signed)
 Per orders of Roselie Mood, NP, injection of B12 given in RT deltoid by Karna Christ, cma.  Patient tolerated injection well.   (3rd out of 4 weekly, then monthly)

## 2024-07-12 ENCOUNTER — Other Ambulatory Visit (HOSPITAL_COMMUNITY): Payer: Self-pay

## 2024-07-12 NOTE — Telephone Encounter (Signed)
 I called Carmen Wall pharmacy and notified pharmacist and he ran the Rx and the co-pay is going to be $50.00.   I called patient and left a message and notified of above message.

## 2024-07-12 NOTE — Telephone Encounter (Signed)
 Pharmacy Patient Advocate Encounter  Received notification from [employer] that Prior Authorization for Zepbound  2.5mg /0.34ml has been APPROVED from 06/11/24 to 03/08/25   PA #/Case ID/Reference #: [id number]

## 2024-07-12 NOTE — Addendum Note (Signed)
 Addended by: KATHEEN ROSELIE CROME on: 07/12/2024 01:33 PM   Modules accepted: Orders

## 2024-07-19 ENCOUNTER — Ambulatory Visit
